# Patient Record
Sex: Female | Born: 1940 | Race: White | Hispanic: No | Marital: Married | Smoking: Former smoker
Health system: Southern US, Community
[De-identification: ages and names within clinical notes are randomized; demographics above are authoritative.]

## PROBLEM LIST (undated history)

## (undated) DIAGNOSIS — F329 Major depressive disorder, single episode, unspecified: Secondary | ICD-10-CM

## (undated) DIAGNOSIS — D126 Benign neoplasm of colon, unspecified: Secondary | ICD-10-CM

## (undated) DIAGNOSIS — K5790 Diverticulosis of intestine, part unspecified, without perforation or abscess without bleeding: Secondary | ICD-10-CM

## (undated) DIAGNOSIS — I10 Essential (primary) hypertension: Secondary | ICD-10-CM

## (undated) DIAGNOSIS — G8929 Other chronic pain: Secondary | ICD-10-CM

## (undated) DIAGNOSIS — R51 Headache: Secondary | ICD-10-CM

## (undated) DIAGNOSIS — K429 Umbilical hernia without obstruction or gangrene: Secondary | ICD-10-CM

## (undated) DIAGNOSIS — R519 Headache, unspecified: Secondary | ICD-10-CM

## (undated) DIAGNOSIS — E039 Hypothyroidism, unspecified: Secondary | ICD-10-CM

## (undated) DIAGNOSIS — E785 Hyperlipidemia, unspecified: Secondary | ICD-10-CM

## (undated) DIAGNOSIS — E0590 Thyrotoxicosis, unspecified without thyrotoxic crisis or storm: Secondary | ICD-10-CM

## (undated) DIAGNOSIS — F419 Anxiety disorder, unspecified: Secondary | ICD-10-CM

## (undated) DIAGNOSIS — K644 Residual hemorrhoidal skin tags: Secondary | ICD-10-CM

## (undated) DIAGNOSIS — F32A Depression, unspecified: Secondary | ICD-10-CM

## (undated) DIAGNOSIS — F5104 Psychophysiologic insomnia: Secondary | ICD-10-CM

## (undated) HISTORY — DX: Anxiety disorder, unspecified: F41.9

## (undated) HISTORY — DX: Headache: R51

## (undated) HISTORY — DX: Hypothyroidism, unspecified: E03.9

## (undated) HISTORY — DX: Depression, unspecified: F32.A

## (undated) HISTORY — DX: Thyrotoxicosis, unspecified without thyrotoxic crisis or storm: E05.90

## (undated) HISTORY — DX: Major depressive disorder, single episode, unspecified: F32.9

## (undated) HISTORY — DX: Diverticulosis of intestine, part unspecified, without perforation or abscess without bleeding: K57.90

## (undated) HISTORY — DX: Umbilical hernia without obstruction or gangrene: K42.9

## (undated) HISTORY — PX: OOPHORECTOMY: SHX86

## (undated) HISTORY — DX: Residual hemorrhoidal skin tags: K64.4

## (undated) HISTORY — DX: Benign neoplasm of colon, unspecified: D12.6

## (undated) HISTORY — PX: TOTAL ABDOMINAL HYSTERECTOMY: SHX209

## (undated) HISTORY — DX: Psychophysiologic insomnia: F51.04

## (undated) HISTORY — PX: COLONOSCOPY: SHX5424

## (undated) HISTORY — DX: Hyperlipidemia, unspecified: E78.5

## (undated) HISTORY — DX: Headache, unspecified: R51.9

## (undated) HISTORY — DX: Other chronic pain: G89.29

## (undated) HISTORY — DX: Essential (primary) hypertension: I10

---

## 1973-11-01 HISTORY — PX: ABDOMINAL HYSTERECTOMY: SHX81

## 1973-11-01 HISTORY — PX: APPENDECTOMY: SHX54

## 1998-03-27 ENCOUNTER — Other Ambulatory Visit: Admission: RE | Admit: 1998-03-27 | Discharge: 1998-03-27 | Payer: Self-pay | Admitting: *Deleted

## 1998-11-28 ENCOUNTER — Ambulatory Visit (HOSPITAL_COMMUNITY): Admission: RE | Admit: 1998-11-28 | Discharge: 1998-11-28 | Payer: Self-pay | Admitting: Orthopaedic Surgery

## 1999-07-07 ENCOUNTER — Other Ambulatory Visit: Admission: RE | Admit: 1999-07-07 | Discharge: 1999-07-07 | Payer: Self-pay | Admitting: *Deleted

## 2004-11-01 HISTORY — PX: OTHER SURGICAL HISTORY: SHX169

## 2004-11-24 ENCOUNTER — Ambulatory Visit: Payer: Self-pay | Admitting: Internal Medicine

## 2004-12-03 ENCOUNTER — Ambulatory Visit: Payer: Self-pay | Admitting: Internal Medicine

## 2004-12-03 DIAGNOSIS — D126 Benign neoplasm of colon, unspecified: Secondary | ICD-10-CM

## 2004-12-03 HISTORY — DX: Benign neoplasm of colon, unspecified: D12.6

## 2004-12-24 ENCOUNTER — Other Ambulatory Visit: Admission: RE | Admit: 2004-12-24 | Discharge: 2004-12-24 | Payer: Self-pay | Admitting: Obstetrics and Gynecology

## 2005-09-01 ENCOUNTER — Encounter: Admission: RE | Admit: 2005-09-01 | Discharge: 2005-09-01 | Payer: Self-pay | Admitting: Orthopedic Surgery

## 2006-01-16 ENCOUNTER — Encounter: Admission: RE | Admit: 2006-01-16 | Discharge: 2006-01-16 | Payer: Self-pay | Admitting: Ophthalmology

## 2008-07-12 ENCOUNTER — Telehealth: Payer: Self-pay | Admitting: Internal Medicine

## 2008-07-22 ENCOUNTER — Ambulatory Visit: Payer: Self-pay | Admitting: Internal Medicine

## 2008-07-22 DIAGNOSIS — K649 Unspecified hemorrhoids: Secondary | ICD-10-CM | POA: Insufficient documentation

## 2008-07-22 DIAGNOSIS — K625 Hemorrhage of anus and rectum: Secondary | ICD-10-CM | POA: Insufficient documentation

## 2009-12-15 ENCOUNTER — Encounter (INDEPENDENT_AMBULATORY_CARE_PROVIDER_SITE_OTHER): Payer: Self-pay | Admitting: *Deleted

## 2009-12-30 DIAGNOSIS — F329 Major depressive disorder, single episode, unspecified: Secondary | ICD-10-CM | POA: Insufficient documentation

## 2009-12-30 DIAGNOSIS — E559 Vitamin D deficiency, unspecified: Secondary | ICD-10-CM | POA: Insufficient documentation

## 2009-12-30 DIAGNOSIS — R002 Palpitations: Secondary | ICD-10-CM | POA: Insufficient documentation

## 2009-12-30 DIAGNOSIS — I10 Essential (primary) hypertension: Secondary | ICD-10-CM | POA: Insufficient documentation

## 2010-01-26 ENCOUNTER — Encounter (INDEPENDENT_AMBULATORY_CARE_PROVIDER_SITE_OTHER): Payer: Self-pay | Admitting: *Deleted

## 2010-02-10 DIAGNOSIS — K635 Polyp of colon: Secondary | ICD-10-CM | POA: Insufficient documentation

## 2010-02-11 ENCOUNTER — Encounter (INDEPENDENT_AMBULATORY_CARE_PROVIDER_SITE_OTHER): Payer: Self-pay | Admitting: *Deleted

## 2010-02-12 ENCOUNTER — Ambulatory Visit: Payer: Self-pay | Admitting: Internal Medicine

## 2010-03-03 ENCOUNTER — Ambulatory Visit: Payer: Self-pay | Admitting: Internal Medicine

## 2010-05-26 DIAGNOSIS — F418 Other specified anxiety disorders: Secondary | ICD-10-CM | POA: Insufficient documentation

## 2010-05-26 DIAGNOSIS — R4589 Other symptoms and signs involving emotional state: Secondary | ICD-10-CM | POA: Insufficient documentation

## 2010-11-22 ENCOUNTER — Encounter (HOSPITAL_BASED_OUTPATIENT_CLINIC_OR_DEPARTMENT_OTHER): Payer: Self-pay | Admitting: Internal Medicine

## 2010-12-01 NOTE — Letter (Signed)
Summary: Colonoscopy Letter  [facility]  [address] [place], [place] [address]   Phone: [phone]  Fax: [phone]      December 15, 2009 MRN: [id number]   Debbie Wells [address] [place], [place]  [address]   Dear [name],   According to your medical record, it is time for you to schedule a Colonoscopy. The American Cancer Society recommends this procedure as a method to detect early colon cancer. Patients with a family history of colon cancer, or a personal history of colon polyps or inflammatory bowel disease are at increased risk.  This letter has beeen generated based on the recommendations made at the time of your procedure. If you feel that in your particular situation this may no longer apply, please contact our office.  Please call our office at [phone] to schedule this appointment or to update your records at your earliest convenience.  Thank you for cooperating with [place] to provide you with the very best care possible.   Sincerely,  Iva Boop, M.D.  [facility] Gastroenterology Division [phone]

## 2010-12-01 NOTE — Miscellaneous (Signed)
Summary: L[facility] PV  Clinical Lists Changes  Medications: Added new medication of MOVIPREP 100 GM  SOLR (PEG-KCL-NACL-NASULF-NA ASC-C) As per prep instructions. - Signed Rx of MOVIPREP 100 GM  SOLR (PEG-KCL-NACL-NASULF-NA ASC-C) As per prep instructions.;  #1 x 0;  Signed;  Entered by: [name] RN;  Authorized by: Iva Boop MD, [facility];  Method used: Electronically to [id number]*, [address] [place], [place], [place]  [address], Ph: [phone], Fax: [phone] Allergies: Changed allergy or adverse reaction from SULFA to SULFA Added new allergy or adverse reaction of VICODIN    Prescriptions: MOVIPREP 100 GM  SOLR (PEG-KCL-NACL-NASULF-NA ASC-C) As per prep instructions.  #1 x 0   Entered by:   [name] RN   Authorized by:   Iva Boop MD, [facility]   Signed by:   [name] RN on 02/12/2010   Method used:   Electronically to        [facility] [phone]* (retail)       [phone] [address]       [place], [place]  [address]       Ph: [phone]       Fax: [phone]   RxID:   [id number]

## 2010-12-01 NOTE — Letter (Signed)
Summary: Previsit letter  [facility]  [address] [place], [place] [address]   Phone: [phone]  Fax: [phone]       01/26/2010 MRN: [id number]  Debbie Wells [address] [place], [place]  [address]  Dear Ms. Berntsen,  Welcome to the Gastroenterology Division at [facility].    You are scheduled to see a nurse for your pre-procedure visit on 02/12/2010 at 10:30AM on the 3rd floor at [facility], [address]  We ask that you try to arrive at our office 15 minutes prior to your appointment time to allow for check-in.  Your nurse visit will consist of discussing your medical and surgical history, your immediate family medical history, and your medications.    Please bring a complete list of all your medications or, if you prefer, bring the medication bottles and we will list them.  We will need to be aware of both prescribed and over the counter drugs.  We will need to know exact dosage information as well.  If you are on blood thinners (Coumadin, Plavix, Aggrenox, Ticlid, etc.) please call our office today/prior to your appointment, as we need to consult with your physician about holding your medication.   Please be prepared to read and sign documents such as consent forms, a financial agreement, and acknowledgement forms.  If necessary, and with your consent, a friend or relative is welcome to sit-in on the nurse visit with you.  Please bring your insurance card so that we may make a copy of it.  If your insurance requires a referral to see a specialist, please bring your referral form from your primary care physician.  No co-pay is required for this nurse visit.     If you cannot keep your appointment, please call [phone] to cancel or reschedule prior to your appointment date.  This allows [place] the opportunity to schedule an appointment for another patient in need of care.    Thank you for choosing [facility] for your medical  needs.  We appreciate the opportunity to care for you.  Please visit [place] at our website  to learn more about our practice.                     Sincerely.                                                                                                                   The Gastroenterology Division

## 2010-12-01 NOTE — Procedures (Signed)
Summary: Colonoscopy  Patient: [name] Note: All result statuses are Final unless otherwise noted.  Tests: (1) Colonoscopy (COL)   COL Colonoscopy           DONE     [facility]     [address]     [place], [place]  [address]           COLONOSCOPY PROCEDURE REPORT           PATIENT:  Debbie Wells, Debbie Wells  MR#:  [id number]     BIRTHDATE:  [birth year], 69 yrs. old  GENDER:  female     ENDOSCOPIST:  Iva Boop, MD, [facility]           PROCEDURE DATE:  03/03/2010     PROCEDURE:  Colonoscopy 19[address]     ASA CLASS:  Class II     INDICATIONS:  surveillance and high-risk screening, history of     pre-cancerous (adenomatous) colon polyps 6 mm adenoma removed 2006           MEDICATIONS:   Fentanyl 75 mcg IV, Versed 8 mg IV           DESCRIPTION OF PROCEDURE:   After the risks benefits and     alternatives of the procedure were thoroughly explained, informed     consent was obtained.  Digital rectal exam was performed and     revealed no abnormalities.   The LB CF-[id number] endoscope     was introduced through the anus and advanced to the cecum, which     was identified by both the appendix and ileocecal valve, without     limitations.  The quality of the prep was good, using MoviPrep.     The instrument was then slowly withdrawn as the colon was fully     examined. Insertion: 3:58 minutes withdrawal: 8:58 minutes     <<PROCEDUREIMAGES>>           FINDINGS:  Moderate diverticulosis was found in the sigmoid colon.     This was otherwise a normal examination of the colon.     Retroflexed views in the rectum revealed no abnormalities.    The     scope was then withdrawn from the patient and the procedure     completed.           COMPLICATIONS:  None     ENDOSCOPIC IMPRESSION:     1) Moderate diverticulosis in the sigmoid colon     2) Otherwise normal examination     3) Personal history of 6 mm adenoma 2006           REPEAT EXAM:  In 5 year(s) for routine colonoscopy for  polyp     surveillance and colon cancer screening.           Iva Boop, MD, [facility]           CC:  Adrian Prince, MD     The Patient           n.     eSIGNED:   Iva Boop at 03/03/2010 09:40 AM           Shauna Hugh, [phone]  Note: An exclamation mark (!) indicates a result that was not dispersed into the flowsheet. Document Creation Date: 03/03/2010 9:40 AM _______________________________________________________________________  (1) Order result status: Final Collection or observation date-time: 03/03/2010 09:29 Requested date-time:  Receipt date-time:  Reported date-time:  Referring Physician:   Ordering Physician: Baldo Ash  Leone Payor [phone]) Specimen Source:  Source: Launa Grill Order Number: [id number] Lab site:   Appended Document: Colonoscopy    Clinical Lists Changes  Observations: Added new observation of COLONNXTDUE: 03/2015 (03/03/2010 13:22)

## 2010-12-01 NOTE — Letter (Signed)
Summary: Moviprep Instructions  [facility]  [address]   [place], [place] [address]   Phone: [phone]  Fax: [phone]       Debbie Wells    [birth year]    MRN: [id number]        Procedure Day Dorna Bloom: Tuesday, 03-03-10     Arrival Time: 8:00 a.m.      Procedure Time: 9:00 a.m.     Location of Procedure:                     x  [facility] (4th Floor)                        PREPARATION FOR COLONOSCOPY WITH MOVIPREP   Starting 5 days prior to your procedure 02-26-10  do not eat nuts, seeds, popcorn, corn, beans, peas,  salads, or any raw vegetables.  Do not take any fiber supplements (e.g. Metamucil, Citrucel, and Benefiber).  THE DAY BEFORE YOUR PROCEDURE         DATE:  03-02-10   DAY: Monday  1.  Drink clear liquids the entire day-NO SOLID FOOD  2.  Do not drink anything colored red or purple.  Avoid juices with pulp.  No orange juice.  3.  Drink at least 64 oz. (8 glasses) of fluid/clear liquids during the day to prevent dehydration and help the prep work efficiently.  CLEAR LIQUIDS INCLUDE: Water Jello Ice Popsicles Tea (sugar ok, no milk/cream) Powdered fruit flavored drinks Coffee (sugar ok, no milk/cream) Gatorade Juice: apple, white grape, white cranberry  Lemonade Clear bullion, consomm, broth Carbonated beverages (any kind) Strained chicken noodle soup Hard Candy                             4.  In the morning, mix first dose of MoviPrep solution:    Empty 1 Pouch A and 1 Pouch B into the disposable container    Add lukewarm drinking water to the top line of the container. Mix to dissolve    Refrigerate (mixed solution should be used within 24 hrs)  5.  Begin drinking the prep at 5:00 p.m. The MoviPrep container is divided by 4 marks.   Every 15 minutes drink the solution down to the next mark (approximately 8 oz) until the full liter is complete.   6.  Follow completed prep with 16 oz of clear liquid of your choice  (Nothing red or purple).  Continue to drink clear liquids until bedtime.  7.  Before going to bed, mix second dose of MoviPrep solution:    Empty 1 Pouch A and 1 Pouch B into the disposable container    Add lukewarm drinking water to the top line of the container. Mix to dissolve    Refrigerate  THE DAY OF YOUR PROCEDURE      DATE:  03-03-10  DAY: Tuesday  Beginning at 4:00 a.m. (5 hours before procedure):         1. Every 15 minutes, drink the solution down to the next mark (approx 8 oz) until the full liter is complete.  2. Follow completed prep with 16 oz. of clear liquid of your choice.    3. You may drink clear liquids until 7:00 a.m.   (2 HOURS BEFORE PROCEDURE).   MEDICATION INSTRUCTIONS  Unless otherwise instructed, you should take regular prescription medications with a small sip of water  as early as possible the morning of your procedure.         OTHER INSTRUCTIONS  You will need a responsible adult at least 69 years of age to accompany you and drive you home.   This person must remain in the waiting room during your procedure.  Wear loose fitting clothing that is easily removed.  Leave jewelry and other valuables at home.  However, you may wish to bring a book to read or  an iPod/MP3 player to listen to music as you wait for your procedure to start.  Remove all body piercing jewelry and leave at home.  Total time from sign-in until discharge is approximately 2-3 hours.  You should go home directly after your procedure and rest.  You can resume normal activities the  day after your procedure.  The day of your procedure you should not:   Drive   Make legal decisions   Operate machinery   Drink alcohol   Return to work  You will receive specific instructions about eating, activities and medications before you leave.    The above instructions have been reviewed and explained to me by  Debbie Sites RN  February 12, 2010 11:03 AM    I fully  understand and can verbalize these instructions _____________________________ Date _________

## 2011-09-15 ENCOUNTER — Other Ambulatory Visit (HOSPITAL_COMMUNITY): Payer: Self-pay | Admitting: Endocrinology

## 2011-09-15 DIAGNOSIS — I350 Nonrheumatic aortic (valve) stenosis: Secondary | ICD-10-CM

## 2011-09-17 ENCOUNTER — Ambulatory Visit (HOSPITAL_COMMUNITY): Payer: Medicare Other | Attending: Internal Medicine | Admitting: Radiology

## 2011-09-17 DIAGNOSIS — R0989 Other specified symptoms and signs involving the circulatory and respiratory systems: Secondary | ICD-10-CM | POA: Insufficient documentation

## 2011-09-17 DIAGNOSIS — R609 Edema, unspecified: Secondary | ICD-10-CM | POA: Insufficient documentation

## 2011-09-17 DIAGNOSIS — R5381 Other malaise: Secondary | ICD-10-CM | POA: Insufficient documentation

## 2011-09-17 DIAGNOSIS — I350 Nonrheumatic aortic (valve) stenosis: Secondary | ICD-10-CM

## 2011-09-17 DIAGNOSIS — I359 Nonrheumatic aortic valve disorder, unspecified: Secondary | ICD-10-CM

## 2011-09-17 DIAGNOSIS — R0609 Other forms of dyspnea: Secondary | ICD-10-CM | POA: Insufficient documentation

## 2011-09-17 DIAGNOSIS — I059 Rheumatic mitral valve disease, unspecified: Secondary | ICD-10-CM | POA: Insufficient documentation

## 2011-09-17 DIAGNOSIS — R5383 Other fatigue: Secondary | ICD-10-CM | POA: Insufficient documentation

## 2011-09-20 ENCOUNTER — Encounter (HOSPITAL_COMMUNITY): Payer: Self-pay | Admitting: Endocrinology

## 2011-11-17 ENCOUNTER — Encounter: Payer: Self-pay | Admitting: Internal Medicine

## 2011-12-07 ENCOUNTER — Ambulatory Visit (INDEPENDENT_AMBULATORY_CARE_PROVIDER_SITE_OTHER): Payer: Medicare Other | Admitting: Internal Medicine

## 2011-12-07 ENCOUNTER — Encounter: Payer: Self-pay | Admitting: Internal Medicine

## 2011-12-07 ENCOUNTER — Other Ambulatory Visit (INDEPENDENT_AMBULATORY_CARE_PROVIDER_SITE_OTHER): Payer: Medicare Other

## 2011-12-07 VITALS — BP 150/72 | HR 68 | Ht 64.5 in | Wt 158.6 lb

## 2011-12-07 DIAGNOSIS — R10812 Left upper quadrant abdominal tenderness: Secondary | ICD-10-CM

## 2011-12-07 DIAGNOSIS — K5900 Constipation, unspecified: Secondary | ICD-10-CM

## 2011-12-07 DIAGNOSIS — R1012 Left upper quadrant pain: Secondary | ICD-10-CM

## 2011-12-07 DIAGNOSIS — K5909 Other constipation: Secondary | ICD-10-CM

## 2011-12-07 LAB — CREATININE, SERUM: Creatinine, Ser: 0.9 mg/dL (ref 0.4–1.2)

## 2011-12-07 LAB — BUN: BUN: 18 mg/dL (ref 6–23)

## 2011-12-07 NOTE — Progress Notes (Signed)
Subjective:    Patient ID: Debbie Wells, female    DOB: 07-18-1941, 71 y.o.   MRN: [id number]  HPI This lady has a 1 year history of LUQ pain that began after falling in the shower. It can be a pulling pain, tightness. Sometimes sharp. Worse when she is getting in and out of bed. Not related to eating. She has chronic constipation and thinks the pain is more intense and is less but persistent after "good elimination". The pain is there all the time. It does not disturb sleep. She cannot defecate with Amitiza or MiraLax which she uses intermittently (both). She will take something after 3 days without defecation. Denies rectal bleeding. She has gotten some mild relief of LUQ pain with intermittent ibuprofen. She is concerned because her father had esophageal cancer his first symptoms were RUQ pain. She has no dysphagia.  Allergies  Allergen Reactions  . Hydrocodone-Acetaminophen     REACTION: rash  . Sulfonamide Derivatives     REACTION: rash   Outpatient Prescriptions Prior to Visit  Medication Sig Dispense Refill  . ALPRAZolam (XANAX) 0.5 MG tablet Take 0.5 mg by mouth 2 (two) times daily.      Marland Kitchen aspirin 81 MG tablet Take 160 mg by mouth daily.      Marland Kitchen atorvastatin (LIPITOR) 10 MG tablet Take 10 mg by mouth daily.      . Calcium Carbonate-Vitamin D (CALCIUM-D) 600-400 MG-UNIT TABS Take 1 tablet by mouth daily.      . DULoxetine (CYMBALTA) 60 MG capsule Take 60 mg by mouth daily.      Marland Kitchen estrogens, conjugated, (PREMARIN) 0.45 MG tablet Take 0.45 mg by mouth daily. Take daily for 21 days then do not take for 7 days.      Marland Kitchen lubiprostone (AMITIZA) 24 MCG capsule 1 or 2 daily as needed      . Multiple Vitamin (MULTIVITAMIN) capsule Take 1 capsule by mouth daily.      . Omega-3 Fatty Acids (FISH OIL PO) Take 1 tablet by mouth daily.      . traZODone (DESYREL) 50 MG tablet Take 50 mg by mouth daily.      . benazepril (LOTENSIN) 20 MG tablet Take 20 mg by mouth daily.      . hydrocortisone  (ANUSOL-HC) 25 MG suppository Place 25 mg rectally at bedtime as needed.      Marland Kitchen levothyroxine (SYNTHROID, LEVOTHROID) 112 MCG tablet Take 112 mcg by mouth daily.       Past Medical History  Diagnosis Date  . Anxiety   . Chronic headaches   . Adenomatous colon polyp   . Diverticulosis   . Depression   . Hyperlipidemia   . Hypertension   . Hypothyroidism    Past Surgical History  Procedure Date  . Spinal lipoma removed 2006  . Abdominal hysterectomy 1975  . Appendectomy 1975  . Colonoscopy 03/03/2010    diverticulosis  . Oophorectomy    History   Social History  . Marital Status: Married    Spouse Name: N/A    Number of Children: 2  . Years of Education: N/A   Occupational History  . retired    Social History Main Topics  . Smoking status: Former Games developer  . Smokeless tobacco: Never Used  . Alcohol Use: Yes     wine occasionally  . Drug Use: No  . Sexually Active: None   Other Topics Concern  . None   Social History Narrative  . None  Family History  Problem Relation Age of Onset  . Esophageal cancer Father   . Stomach cancer Father     mets from esophagus  . Heart disease Mother   . Irritable bowel syndrome Mother   . Kidney disease Mother   . Colon cancer Neg Hx         Review of Systems + anxiety, depression, fatigue, mild urinary incontinence. All other review of systems negative or as per history of present illness    Objective:   Physical Exam General:  Well-developed, well-nourished and in no acute distress Eyes:  anicteric. ENT:   Mouth and posterior pharynx free of lesions.  Neck:   supple w/o thyromegaly or mass.  Lungs: Clear to auscultation bilaterally. Heart:  S1S2, no rubs, murmurs, gallops. Abdomen:  soft, non-tender, no hepatosplenomegaly, hernia, or mass and BS+.  Chest wall: Tender over inferior left anterior ribs in the LUQ area, and some abdominal wall pain and tenderness with muscle tension Lymph:  no cervical or  supraclavicular adenopathy. Skin   no rash. Neuro:  A&O x 3.  Psych:  appropriate mood and  Affect.   Data Reviewed: Laboratory testing from November 1 shows a normal CBC, normal lipid panel, normal metabolic panel comprehensive. Direct function was normal. TSH was low but her free T4 was normal. Vitamin D was 43. Urinalysis was negative.        Assessment & Plan:   1. LUQ pain   2. LUQ abdominal tenderness    3. Chronic constipation I think this is probably musculoskeletal process. She's quite concerned about the possibility of an underlying malignancy or more serious problem due to the persistence. I await with CT scan of the abdomen with contrast. Once that is done we'll call the results. She may need a topical or oral nonsteroidal regular basis.   I have advised her to take her Amitiza daily instead of intermittently to help her chronic constipation.

## 2011-12-07 NOTE — Patient Instructions (Addendum)
Please go to the basement upon leaving today to have your labs done. Please take your Amitiza every day for your constipation.  You have been scheduled for a CT scan of the abdomen and pelvis at [facility] ([address]---this is in the same building as Architectural technologist).   You are scheduled on 12/13/11 at 9:00 am. You should arrive 15 minutes prior to your appointment time for registration. Please follow the written instructions below on the day of your exam:  WARNING: IF YOU ARE ALLERGIC TO IODINE/X-RAY DYE, PLEASE NOTIFY RADIOLOGY IMMEDIATELY AT [phone]! YOU WILL BE GIVEN A 13 HOUR PREMEDICATION PREP.  1) Do not eat or drink anything after 5:00 am (4 hours prior to your test) 2) You have been given 2 bottles of oral contrast to drink. The solution may taste better if refrigerated, but do NOT add ice or any other liquid to this solution. Shake well before drinking.    Drink 1 bottle of contrast @ 7:00 am (2 hours prior to your exam)  Drink 1 bottle of contrast @ 8:00 am (1 hour prior to your exam)  You may take any medications as prescribed with a small amount of water except for the following: Metformin, Glucophage, Glucovance, Avandamet, Riomet, Fortamet, Actoplus Met, Janumet, Glumetza or Metaglip. The above medications must be held the day of the exam AND 48 hours after the exam.  The purpose of you drinking the oral contrast is to aid in the visualization of your intestinal tract. The contrast solution may cause some diarrhea. Before your exam is started, you will be given a small amount of fluid to drink. Depending on your individual set of symptoms, you may also receive an intravenous injection of x-ray contrast/dye. Plan on being at [facility] for 30 minutes or long, depending on the type of exam you are having performed.  If you have any questions regarding your exam or if you need to reschedule, you may call the CT department at [phone] between the  hours of 8:00 am and 5:00 pm, Monday-Friday.  ________________________________________________________________________

## 2011-12-10 ENCOUNTER — Other Ambulatory Visit: Payer: Medicare Other

## 2011-12-13 ENCOUNTER — Other Ambulatory Visit: Payer: Self-pay

## 2011-12-13 ENCOUNTER — Ambulatory Visit (INDEPENDENT_AMBULATORY_CARE_PROVIDER_SITE_OTHER)
Admission: RE | Admit: 2011-12-13 | Discharge: 2011-12-13 | Disposition: A | Discharge status: Home / Self Care | Payer: Medicare Other | Source: Ambulatory Visit | Attending: Internal Medicine | Admitting: Internal Medicine

## 2011-12-13 DIAGNOSIS — R10812 Left upper quadrant abdominal tenderness: Secondary | ICD-10-CM

## 2011-12-13 DIAGNOSIS — R1012 Left upper quadrant pain: Secondary | ICD-10-CM

## 2011-12-13 DIAGNOSIS — R933 Abnormal findings on diagnostic imaging of other parts of digestive tract: Secondary | ICD-10-CM

## 2011-12-13 MED ORDER — IOHEXOL 300 MG/ML  SOLN
100.0000 mL | Freq: Once | INTRAMUSCULAR | Status: AC | PRN
  Administered 2011-12-13: 100 mL via INTRAVENOUS

## 2011-12-13 NOTE — Progress Notes (Signed)
Quick Note:  No clear cause of her pain seen The stomach wall appears to be thickened - the radiologist thinks because stomach did not distend. He is probably right but with the chronicity of her pain I would recommend an EGD - indication is abnormal stomach on CT 793.4 ______

## 2011-12-14 ENCOUNTER — Ambulatory Visit (AMBULATORY_SURGERY_CENTER): Payer: Medicare Other | Admitting: *Deleted

## 2011-12-14 VITALS — Ht 64.0 in | Wt 158.0 lb

## 2011-12-14 DIAGNOSIS — R1012 Left upper quadrant pain: Secondary | ICD-10-CM

## 2011-12-14 DIAGNOSIS — R933 Abnormal findings on diagnostic imaging of other parts of digestive tract: Secondary | ICD-10-CM

## 2011-12-16 ENCOUNTER — Encounter: Payer: Self-pay | Admitting: Internal Medicine

## 2011-12-16 ENCOUNTER — Ambulatory Visit (AMBULATORY_SURGERY_CENTER): Payer: Medicare Other | Admitting: Internal Medicine

## 2011-12-16 VITALS — BP 145/81 | HR 74 | Temp 97.8°F | Resp 16 | Ht 64.0 in | Wt 158.0 lb

## 2011-12-16 DIAGNOSIS — R933 Abnormal findings on diagnostic imaging of other parts of digestive tract: Secondary | ICD-10-CM

## 2011-12-16 DIAGNOSIS — R1012 Left upper quadrant pain: Secondary | ICD-10-CM

## 2011-12-16 HISTORY — PX: ESOPHAGOGASTRODUODENOSCOPY: SHX1529

## 2011-12-16 MED ORDER — LIDOCAINE 5 % EX PTCH: 1.0000 | MEDICATED_PATCH | Freq: Two times a day (BID) | CUTANEOUS | Status: DC | PRN

## 2011-12-16 MED ORDER — SODIUM CHLORIDE 0.9 % IV SOLN: 500.0000 mL | INTRAVENOUS | Status: DC

## 2011-12-16 NOTE — Patient Instructions (Signed)
The esophagus, stomach and duodenum look normal. I have prescribed a patch with numbing agent for you to apply to the area you have pain, in the left upper abdomen and lower chest area. Try that - if it works use it for a few weeks and then see if you can stop. If you have persistent problems with the pain I suggest you talk to Dr. Evlyn Kanner about it as I do not think it is coming from your gastrointestinal tract but more likely from the muscles and possibly bones in the area. Iva Boop, MD, [facility]

## 2011-12-16 NOTE — Progress Notes (Signed)
Patient did not experience any of the following events: a burn prior to discharge; a fall within the facility; wrong site/side/patient/procedure/implant event; or a hospital transfer or hospital admission upon discharge from the facility. (G8907) Patient did not have preoperative order for IV antibiotic SSI prophylaxis. (G8918)  

## 2011-12-16 NOTE — Op Note (Signed)
[facility] [address] [place], [place]  [address]  ENDOSCOPY PROCEDURE REPORT  PATIENT:  Debbie, Wells  MR#:  [id number] BIRTHDATE:  27-Oct-1941, 70 yrs. old  GENDER:  female  ENDOSCOPIST:  Iva Boop, MD, [facility]  PROCEDURE DATE:  12/16/2011 PROCEDURE:  EGD, diagnostic [phone] ASA CLASS:  Class II INDICATIONS:  abdominal pain, left upper quadr, abnormal imaging - thickened gastric wall on CT  MEDICATIONS:   These medications were titrated to patient response per physician's verbal order, Fentanyl 50 mcg IV, Versed 8 mg IV TOPICAL ANESTHETIC:  Cetacaine Spray  DESCRIPTION OF PROCEDURE:   After the risks benefits and alternatives of the procedure were thoroughly explained, informed consent was obtained.  The LB [id number]-H180 [id number] endoscope was introduced through the mouth and advanced to the second portion of the duodenum, without limitations.  The instrument was slowly withdrawn as the mucosa was fully examined. <<PROCEDUREIMAGES>>  The upper, middle, and distal third of the esophagus were carefully inspected and no abnormalities were noted. The z-line was well seen at the GEJ. The endoscope was pushed into the fundus which was normal including a retroflexed view. The antrum,gastric body, first and second part of the duodenum were unremarkable. Retroflexed views revealed no abnormalities.    The scope was then withdrawn from the patient and the procedure completed.  COMPLICATIONS:  None  ENDOSCOPIC IMPRESSION: 1) Normal EGD RECOMMENDATIONS: Try lidoderm patch. Follow-up PCP if persistent problems - do not think she has a GI source of pain.  Iva Boop, MD, [facility]  CC:  Adrian Prince, MD and The Patient  n. eSIGNED:   Iva Boop at 12/16/2011 04:28 PM  Shauna Hugh, [phone]

## 2011-12-16 NOTE — Progress Notes (Signed)
Patient stated she did break out in a fine rash widespread trunk,back,hips and legs on 12/14/11. Patient had a CT with dye on Monday 12/13/11.

## 2011-12-17 ENCOUNTER — Telehealth: Payer: Self-pay | Admitting: *Deleted

## 2011-12-17 NOTE — Telephone Encounter (Signed)
  Follow up Call-  Call back number 12/16/2011  Post procedure Call Back phone  # [phone]  Permission to leave phone message Yes     Patient questions:  Do you have a fever, pain , or abdominal swelling? no Pain Score  0 *  Have you tolerated food without any problems? yes  Have you been able to return to your normal activities? yes  Do you have any questions about your discharge instructions: Diet   no Medications  no Follow up visit  no  Do you have questions or concerns about your Care? no  Actions: * If pain score is 4 or above: No action needed, pain <4.

## 2012-02-14 ENCOUNTER — Telehealth: Payer: Self-pay | Admitting: Internal Medicine

## 2012-02-14 NOTE — Telephone Encounter (Signed)
Per office and procedure notes the patient was to follow up with her primary care.  "Do not think she has a GI source of pain".  Patient advised to follow up with her GI

## 2012-05-26 ENCOUNTER — Telehealth: Payer: Self-pay | Admitting: Internal Medicine

## 2012-05-26 NOTE — Telephone Encounter (Signed)
Patient states she saw Dr. Leone Payor back in spring and was told no GI problem and see PCP. She has done this and PCP cannot find her problem either. States she has had a change in bowel movements since her visit. She had constipation in past but now has pencil size BM's to small pieces. She also states she did not tell Dr. Leone Payor that her father died with stomach cancer. She feels her symptoms are worse and have changed. Her PCP told her to see GI again. Scheduled patient on 06/19/12 at 2:15 PM.

## 2012-06-19 ENCOUNTER — Ambulatory Visit (INDEPENDENT_AMBULATORY_CARE_PROVIDER_SITE_OTHER): Payer: Medicare Other | Admitting: Internal Medicine

## 2012-06-19 ENCOUNTER — Ambulatory Visit (INDEPENDENT_AMBULATORY_CARE_PROVIDER_SITE_OTHER)
Admission: RE | Admit: 2012-06-19 | Discharge: 2012-06-19 | Disposition: A | Discharge status: Home / Self Care | Payer: Medicare Other | Source: Ambulatory Visit | Attending: Internal Medicine | Admitting: Internal Medicine

## 2012-06-19 ENCOUNTER — Encounter: Payer: Self-pay | Admitting: Internal Medicine

## 2012-06-19 VITALS — BP 142/86 | HR 60 | Ht 63.5 in | Wt 155.0 lb

## 2012-06-19 DIAGNOSIS — M549 Dorsalgia, unspecified: Secondary | ICD-10-CM

## 2012-06-19 DIAGNOSIS — R109 Unspecified abdominal pain: Secondary | ICD-10-CM

## 2012-06-19 DIAGNOSIS — G8929 Other chronic pain: Secondary | ICD-10-CM

## 2012-06-19 DIAGNOSIS — R198 Other specified symptoms and signs involving the digestive system and abdomen: Secondary | ICD-10-CM

## 2012-06-19 DIAGNOSIS — R194 Change in bowel habit: Secondary | ICD-10-CM

## 2012-06-19 DIAGNOSIS — R1011 Right upper quadrant pain: Secondary | ICD-10-CM

## 2012-06-19 DIAGNOSIS — R1010 Upper abdominal pain, unspecified: Secondary | ICD-10-CM

## 2012-06-19 MED ORDER — DEXLANSOPRAZOLE 60 MG PO CPDR: 60.0000 mg | DELAYED_RELEASE_CAPSULE | Freq: Every day | ORAL | Status: DC

## 2012-06-19 NOTE — Patient Instructions (Addendum)
Your physician has requested that you go to the basement for the following lab work before leaving today: IGA, TTG  Please go to the basement and get a spine x-ray today.  We are giving you samples of Dexilant, take one 30 minutes prior to breakfast.  Follow-up with [place] in one month.  Thank you for choosing me and [facility].  Iva Boop, M.D., [facility]

## 2012-06-20 ENCOUNTER — Other Ambulatory Visit (INDEPENDENT_AMBULATORY_CARE_PROVIDER_SITE_OTHER): Payer: Medicare Other

## 2012-06-20 DIAGNOSIS — R1010 Upper abdominal pain, unspecified: Secondary | ICD-10-CM

## 2012-06-20 DIAGNOSIS — R109 Unspecified abdominal pain: Secondary | ICD-10-CM

## 2012-06-20 DIAGNOSIS — R198 Other specified symptoms and signs involving the digestive system and abdomen: Secondary | ICD-10-CM

## 2012-06-20 DIAGNOSIS — R194 Change in bowel habit: Secondary | ICD-10-CM

## 2012-06-20 LAB — IGA: IgA: 185 mg/dL (ref 68–378)

## 2012-06-20 NOTE — Progress Notes (Signed)
Quick Note:  spine films ok Waiting on celiac testing ______

## 2012-06-24 DIAGNOSIS — G8929 Other chronic pain: Secondary | ICD-10-CM | POA: Insufficient documentation

## 2012-06-24 NOTE — Progress Notes (Signed)
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

## 2012-06-24 NOTE — Progress Notes (Signed)
Subjective:    Patient ID: Debbie Wells, female    DOB: 02/05/1941, 71 y.o.   MRN: [id number]  HPI This lady presents in followup after lasting seen in February or March of this year. At that time she had at least a 1 year history of left upper quadrant pain that I thought was musculoskeletal. History obtained at that time was that she had fallen in the shower. She now tells me the pain was intensified and that was present prior to that. She had negative EGD and CT scan and labs. She has chronic constipation. Amitiza helped her defecate but if takes a daily she has diarrhea. Every other day seems to promote good bowel movements but does not really relieve this pain. She tried linaclotide but that caused diarrhea. When she does defecate she feels sore and it is  painful. A Lidoderm patch was prescribed which does provide some relief, obviously only when she wears. She does have the back pain also.  During the interview she becomes tearful and very upset and is clearly very tired of being disturbed by this pain. She remains fearful as she may have cancer like her fathr., Though I reassured her that she does not have any signs of esophageal cancer. She reports that her daughter was diagnosed with small bowel intestinal overgrowth and she wonders if that is her problem because her daughter had very similar symptoms apparently. She is much better after antibiotic therapy  Patient does have depression and has been on Cymbalta for a number of years. Wt Readings from Last 3 Encounters:  06/19/12 155 lb (70.308 kg)  12/16/11 158 lb (71.668 kg)  12/14/11 158 lb (71.668 kg)   Medications, allergies, past medical history, past surgical history, family history and social history are reviewed and updated in the EMR.  Review of Systems As per history of present illness    Objective:   Physical Exam General:  NAD Eyes:   anicteric Lungs:  clear Heart:  S1S2 no rubs, murmurs or gallops Abdomen:  soft and  BS+, tender in the left upper quadrant, over the ribs and in the abdominal wall. The pain is worse with muscle tension. Ext:   no edema Psych:  Depressed and tearful   Data Reviewed: As above     Assessment & Plan:   1. Upper abdominal pain   2. Change in bowel habits   3. Mid back pain    1. There does not appear to be a gastrointestinal cause for her pain outside of the possibility of irritable bowel syndrome along with musculoskeletal abdominal wall pain and I suspect somatzation issues. 2. Will check T-spine films, these have come back negative. 3. Will check celiac antibody testing, this has come back negative. 4. I'm inclined to go ahead with a trial of antibiotic therapy just to satisfy her nausea and that she might have bacterial overgrowth though I think it's quite unlikely to relieve her symptoms. 5. Will try to prescribe Xifaxan 550 mg 3 times a day for 10 days. 6. This might, to another colonoscopy though she's had one in the last couple of years, she also may need to look at changing her antidepressant therapy or have a more chronic pain focus to her therapy (without narcotics)   THORACIC SPINE - 2 VIEW  Comparison: None.  Findings: Mild narrowing of the C5-6 disc is incidentally noted.  There is no evidence of thoracic spine fracture. Alignment is  normal. No other significant bone abnormalities are  identified.  IMPRESSION:  1. Negative thoracic spine  2. Degenerative disc disease C5-6.  Tissue Transglutaminase Ab, IgA 3.3 <20 U/mL Comments: Reference Range: <20 Negative 20-25 Equivocal >25 Positive     CC: Julian Hy, MD

## 2012-06-26 ENCOUNTER — Other Ambulatory Visit: Payer: Self-pay

## 2012-06-26 MED ORDER — RIFAXIMIN 550 MG PO TABS: 550.0000 mg | ORAL_TABLET | Freq: Three times a day (TID) | ORAL | Status: DC

## 2012-07-25 ENCOUNTER — Encounter: Payer: Self-pay | Admitting: Internal Medicine

## 2012-07-25 ENCOUNTER — Ambulatory Visit (INDEPENDENT_AMBULATORY_CARE_PROVIDER_SITE_OTHER): Payer: Medicare Other | Admitting: Internal Medicine

## 2012-07-25 VITALS — BP 138/78 | HR 60 | Ht 64.0 in | Wt 155.0 lb

## 2012-07-25 DIAGNOSIS — K429 Umbilical hernia without obstruction or gangrene: Secondary | ICD-10-CM

## 2012-07-25 DIAGNOSIS — R10819 Abdominal tenderness, unspecified site: Secondary | ICD-10-CM

## 2012-07-25 DIAGNOSIS — R1013 Epigastric pain: Secondary | ICD-10-CM

## 2012-07-25 DIAGNOSIS — R1033 Periumbilical pain: Secondary | ICD-10-CM

## 2012-07-25 NOTE — Progress Notes (Signed)
Subjective:    Patient ID: Debbie Wells, female    DOB: Mar 28, 1941, 71 y.o.   MRN: [id number]  HPI The patient returns with persistent abdominal pain. Mainly in upper mid, epigastric pain that is constant. She also has other abdominal pain. I had prescribed Xifaxan when she was last seen here, to see if she had a component of bacterial overgrowth contributing to bloating and some of her symptoms. I also given Dexilant samples. She reports that while on the Xifaxan and about 5 days after she felt somewhat better though certainly not relieved, there was a decrease in soreness. Soon after that she developed diarrhea and stopped the Dexilant and the diarrhea resolved.  MiraLax helps her move her bowels but she still is incomplete defecation. When she uses Amitiza her bowel movement that causes increased pain since she avoids that and linaclotide cause diarrhea when she was given that.  She is very frustrated and willing and almost requesting to do any other studies like an MRI or repeating a colonoscopy to try to understand the cause. I have explained before and again today that I don't think those studies would yield any useful information. In the past she has had abdominal wall type symptoms and findings on exam, and some of her pain is related to movement.   Medications, allergies, past medical history, past surgical history, family history and social history are reviewed and updated in the EMR.    Review of Systems As above    Objective:   Physical Exam Well-developed well-nourished elderly white woman in no acute distress Mood and affect appear appropriate Exam shows an easily reducible umbilical hernia that is somewhat tender. She is more tender superior to this. There is a diastasis recti that is somewhat tender. She also has tenderness in the left lower groin area above and on the pubic bones. I cannot reproduce pain with leg flexion or straight leg raise her manipulation of the hip. When  I palpate her abdomen there is other diffuse tenderness less severe. She tends to have more pain in these areas with muscle tension with the exception of the umbilical hernia area.       Assessment & Plan:   1. Periumbilical abdominal pain   2. Epigastric pain   3. Abdominal tenderness   4. Umbilical hernia     1. These problems are all chronic for over a year. I've explained to her that points to a benign etiology but understand that she is suffering. 2. Or maybe a component or contribution of her umbilical hernia causing problems though I'm not convinced. I think it would be worthwhile for another physician to examine her and get their thoughts on the cause of her abdominal pain and symptoms. General surgery consultation appointment will be made. Hernia repair is an option though I made it clear I did not think that would relieve all of her problems and that we were just after an opinion about possible contribution to her symptoms and her overall abdominal pain, mostly. 3. Pending this evaluation I am at a loss as to what to recommend. She may need a tertiary GI referral. I really think some/most of her pain is abdominal wall. She is already on trazodone, perhaps changing that to a low dose tricyclic might provide some relief. She is already on Cymbalta.  In the past there was definitely a mielieu. of anxiety and stress over this and I do understand her frustration. I don't know if there is some other  underlying stress that is making this worse. 4. I do not think, based upon what I can see a repeat colonoscopy which she had any significant light on this, she's had a CT scan in the past year, she's had a colonoscopy in 2011. I doubt an MRI would tell if anything different.   CC: Julian Hy, MD and Claud Kelp

## 2012-07-25 NOTE — Patient Instructions (Addendum)
You have been scheduled for an appointment with Dr. Derrell Lolling at [facility]. Your appointment is on 08/18/12 at 4:00pm Please arrive at 3:30 for registration. Make certain to bring a list of current medications, including any over the counter medications or vitamins. Also bring your co-pay if you have one as well as your insurance cards. [facility] is located at [address]. Should you need to reschedule your appointment, please contact them at [phone].  Thank you for choosing me and  Gastroenterology.  Iva Boop, M.D., [facility]

## 2012-08-18 ENCOUNTER — Ambulatory Visit (INDEPENDENT_AMBULATORY_CARE_PROVIDER_SITE_OTHER): Payer: Medicare Other | Admitting: General Surgery

## 2012-08-18 ENCOUNTER — Encounter (INDEPENDENT_AMBULATORY_CARE_PROVIDER_SITE_OTHER): Payer: Self-pay | Admitting: General Surgery

## 2012-08-18 VITALS — BP 128/80 | HR 80 | Temp 97.3°F | Resp 18 | Ht 64.5 in | Wt 152.4 lb

## 2012-08-18 DIAGNOSIS — R1013 Epigastric pain: Secondary | ICD-10-CM

## 2012-08-18 DIAGNOSIS — K420 Umbilical hernia with obstruction, without gangrene: Secondary | ICD-10-CM

## 2012-08-18 DIAGNOSIS — G8929 Other chronic pain: Secondary | ICD-10-CM

## 2012-08-18 NOTE — Patient Instructions (Signed)
You have a small umbilical hernia with some incarcerated fatty tissue in it. I am not sure that this explains all of your symptoms.  Although your symptoms or not classic for gallbladder disease, you  will be scheduled for gallbladder x-rays to be sure that you do not have gallbladder problems.  You will be given an appointment to return to see Dr. Derrell Lolling after the x-rays are performed.

## 2012-08-18 NOTE — Progress Notes (Addendum)
Patient ID: DENICE CARDON, female   DOB: 12-14-1940, 71 y.o.   MRN: [id number]  No chief complaint on file.   HPI LONA SIX is a 71 y.o. female.  She is referred by Dr. Stan Head for abdominal pain and umbilical hernia. Dr. Adrian Prince is  her primary care physician and endocrinologist.  This is a pleasant woman who admits that she has a fair amount of depression and anxiety. She is very open about this. She has epigastric pain that waxes and wanes but is fairly persistent.  The pain will  sometimes grab her very hard and radiate up to the left. She says is sometimes  worse when she has bowel movements. She has been constipated but this is better now with daily MiraLAX. She says food really does not seem to have an aggravating or alleviating factor on epigastric pain. She has been tried on a variety of medications.  She had upper endoscopy in March of this year which was unremarkable. Colonoscopy 2011 was apparently unremarkable. CT scan on 12/13/2011 showed a small fatty containing umbilical hernia but was otherwise negative. She has not had any ultrasound or biliary scan. She says that she has had lab work which was normal but I do not have those reports.  Comorbidities include history of depression, on beta blocker for headaches, hypertension, on Premarin, Synthroid, on trazodone and Xanax for anxiety.  She admits to having fear of cancer because her father died of metastatic esophageal cancer. HPI  Past Medical History  Diagnosis Date  . Anxiety   . Chronic headaches   . Adenomatous colon polyp   . Diverticulosis   . Depression   . Hyperlipidemia   . Hypertension   . Hypothyroidism   . Adenomatous polyp of colon 12/03/2004    6mm  . External hemorrhoids     Past Surgical History  Procedure Date  . Spinal lipoma removed 2006  . Abdominal hysterectomy 1975  . Appendectomy 1975  . Colonoscopy 03/03/2010,12/03/2004  . Oophorectomy   . Esophagogastroduodenoscopy 12/16/11   Dr. Stan Head    Family History  Problem Relation Age of Onset  . Esophageal cancer Father   . Stomach cancer Father     mets from esophagus  . Heart disease Mother   . Irritable bowel syndrome Mother   . Kidney disease Mother   . Colon cancer Neg Hx     Social History History  Substance Use Topics  . Smoking status: Former Games developer  . Smokeless tobacco: Never Used  . Alcohol Use: 1.8 oz/week    3 Glasses of wine per week     wine occasionally    Allergies  Allergen Reactions  . Hydrocodone-Acetaminophen     REACTION: rash  . Sulfonamide Derivatives     REACTION: rash    Current Outpatient Prescriptions  Medication Sig Dispense Refill  . ALPRAZolam (XANAX) 0.5 MG tablet Take 0.5 mg by mouth 2 (two) times daily.      Marland Kitchen aspirin 81 MG tablet Take 160 mg by mouth daily.      Marland Kitchen atorvastatin (LIPITOR) 10 MG tablet Take 10 mg by mouth daily.      . benazepril (LOTENSIN) 10 MG tablet Take 10 mg by mouth daily.      . Calcium Carbonate-Vitamin D (CALCIUM-D) 600-400 MG-UNIT TABS Take 1 tablet by mouth daily.      . Cyanocobalamin (VITAMIN B-12 PO) Take by mouth.      . DULoxetine (CYMBALTA) 60 MG capsule  Take 60 mg by mouth daily.      Marland Kitchen estrogens, conjugated, (PREMARIN) 0.45 MG tablet Take 0.45 mg by mouth daily. Take daily for 21 days then do not take for 7 days.      Marland Kitchen levothyroxine (SYNTHROID, LEVOTHROID) 125 MCG tablet Take 125 mcg by mouth daily.      Marland Kitchen lidocaine (LIDODERM) 5 % Place 1 patch onto the skin every 12 (twelve) hours as needed. Remove & Discard patch within 12 hours or as directed by MD  30 patch  1  . Multiple Vitamin (MULTIVITAMIN) capsule Take 1 capsule by mouth daily.      . Omega-3 Fatty Acids (FISH OIL PO) Take 1 tablet by mouth daily.      . polyethylene glycol (MIRALAX / GLYCOLAX) packet Take 17 g by mouth as needed.      . propranolol (INDERAL) 20 MG tablet Take 20 mg by mouth daily.      . traZODone (DESYREL) 50 MG tablet Take 50 mg by mouth daily.         Review of Systems Review of Systems  Constitutional: Negative for fever, chills and unexpected weight change.  HENT: Negative for hearing loss, congestion, sore throat, trouble swallowing and voice change.   Eyes: Negative for visual disturbance.  Respiratory: Negative for cough and wheezing.   Cardiovascular: Negative for chest pain, palpitations and leg swelling.  Gastrointestinal: Positive for abdominal pain and constipation. Negative for nausea, vomiting, diarrhea, blood in stool, abdominal distention and anal bleeding.  Genitourinary: Negative for hematuria, vaginal bleeding and difficulty urinating.  Musculoskeletal: Negative for arthralgias.  Skin: Negative for rash and wound.  Neurological: Negative for seizures, syncope and headaches.  Hematological: Negative for adenopathy. Does not bruise/bleed easily.  Psychiatric/Behavioral: Negative for confusion. The patient is nervous/anxious.     Blood pressure 128/80, pulse 80, temperature 97.3 F (36.3 C), temperature source Temporal, resp. rate 18, height 5' 4.5" (1.638 m), weight 152 lb 6.4 oz (69.128 kg).  Physical Exam Physical Exam  Constitutional: She is oriented to person, place, and time. She appears well-developed and well-nourished. No distress.  HENT:  Head: Normocephalic and atraumatic.  Nose: Nose normal.  Mouth/Throat: No oropharyngeal exudate.  Eyes: Conjunctivae normal and EOM are normal. Pupils are equal, round, and reactive to light. Left eye exhibits no discharge. No scleral icterus.  Neck: Neck supple. No JVD present. No tracheal deviation present. No thyromegaly present.  Cardiovascular: Normal rate, regular rhythm, normal heart sounds and intact distal pulses.   No murmur heard. Pulmonary/Chest: Effort normal and breath sounds normal. No respiratory distress. She has no wheezes. She has no rales. She exhibits no tenderness.  Abdominal: Soft. Bowel sounds are normal. She exhibits no distension and no  mass. There is no tenderness. There is no rebound and no guarding.       Small umbilical hernia with incarcerated fatty tissue. Non-inflamed. Minimally tender on forceful examination. Defects certainly less than 2 cm. No mass otherwise detected.  Musculoskeletal: She exhibits no edema and no tenderness.  Lymphadenopathy:    She has no cervical adenopathy.  Neurological: She is alert and oriented to person, place, and time. She exhibits normal muscle tone. Coordination normal.  Skin: Skin is warm. No rash noted. She is not diaphoretic. No erythema. No pallor.  Psychiatric: She has a normal mood and affect. Her behavior is normal. Judgment and thought content normal.    Data Reviewed Imaging reports. Office notes from Dr. Leone Payor..  Assessment  Epigastric abdominal pain. Etiology unclear  Incarcerated umbilical hernia, small, probably asymptomatic  Anxiety and depression    Plan    We had a long talk about her symptoms and her umbilical hernia. I told her I was uncertain whether the umbilical  hernia was causing any of her symptoms, but most likely not. I told that she might have to have this repaired at some point in the future, but that I did not think it would relieve her symptoms of abdominal pain at this time.  We decided to go ahead with a gallbladder ultrasound and hepatobiliary scan with CCK stimulation to rule out gallbladder disease. She seemed in favor of this. She knows that her symptoms are not classic.  She'll return to see me after this is done for further discussion.      Angelia Mould. Derrell Lolling, M.D., [facility] General and Minimally invasive Surgery Breast and Colorectal Surgery Office:   [phone] Pager:   [phone]  08/18/2012, 4:48 PM

## 2012-08-28 ENCOUNTER — Other Ambulatory Visit (INDEPENDENT_AMBULATORY_CARE_PROVIDER_SITE_OTHER): Payer: Self-pay | Admitting: General Surgery

## 2012-08-28 DIAGNOSIS — R1013 Epigastric pain: Secondary | ICD-10-CM

## 2012-08-28 DIAGNOSIS — K420 Umbilical hernia with obstruction, without gangrene: Secondary | ICD-10-CM

## 2012-08-28 DIAGNOSIS — G8929 Other chronic pain: Secondary | ICD-10-CM

## 2012-08-31 ENCOUNTER — Encounter (HOSPITAL_COMMUNITY)
Admission: RE | Admit: 2012-08-31 | Discharge: 2012-08-31 | Disposition: A | Discharge status: Home / Self Care | Payer: Medicare Other | Source: Ambulatory Visit | Attending: General Surgery | Admitting: General Surgery

## 2012-08-31 ENCOUNTER — Ambulatory Visit (HOSPITAL_COMMUNITY): Payer: Medicare Other

## 2012-08-31 ENCOUNTER — Other Ambulatory Visit (INDEPENDENT_AMBULATORY_CARE_PROVIDER_SITE_OTHER): Payer: Self-pay | Admitting: General Surgery

## 2012-08-31 ENCOUNTER — Telehealth (INDEPENDENT_AMBULATORY_CARE_PROVIDER_SITE_OTHER): Payer: Self-pay | Admitting: General Surgery

## 2012-08-31 DIAGNOSIS — R1013 Epigastric pain: Secondary | ICD-10-CM | POA: Insufficient documentation

## 2012-08-31 DIAGNOSIS — G8929 Other chronic pain: Secondary | ICD-10-CM

## 2012-08-31 DIAGNOSIS — K420 Umbilical hernia with obstruction, without gangrene: Secondary | ICD-10-CM

## 2012-08-31 MED ORDER — TECHNETIUM TC 99M MEBROFENIN IV KIT
5.5000 | PACK | Freq: Once | INTRAVENOUS | Status: AC | PRN
  Administered 2012-08-31: 6 via INTRAVENOUS

## 2012-08-31 NOTE — Telephone Encounter (Signed)
Patient called in to question the test that was moved by [facility] Long to 09/04/12. Upon review the incorrect test was ordered when she was here at our office. It should have been an ultrasound abdomen complete in order to evaluate her gallbladder. Apologized to the patient and called [facility] to schedule the test. It will be done on 09/06/12 at 9:00.

## 2012-09-04 ENCOUNTER — Other Ambulatory Visit (HOSPITAL_COMMUNITY): Payer: Medicare Other

## 2012-09-04 ENCOUNTER — Telehealth (INDEPENDENT_AMBULATORY_CARE_PROVIDER_SITE_OTHER): Payer: Self-pay | Admitting: General Surgery

## 2012-09-04 NOTE — Telephone Encounter (Signed)
Pt called for results of HIDA scan done last week; gave her the results.  Normal scan with EF 38%.  She was pleased.

## 2012-09-06 ENCOUNTER — Ambulatory Visit (HOSPITAL_COMMUNITY)
Admission: RE | Admit: 2012-09-06 | Discharge: 2012-09-06 | Disposition: A | Discharge status: Home / Self Care | Payer: Medicare Other | Source: Ambulatory Visit | Attending: General Surgery | Admitting: General Surgery

## 2012-09-06 ENCOUNTER — Telehealth (INDEPENDENT_AMBULATORY_CARE_PROVIDER_SITE_OTHER): Payer: Self-pay | Admitting: General Surgery

## 2012-09-06 DIAGNOSIS — R109 Unspecified abdominal pain: Secondary | ICD-10-CM | POA: Insufficient documentation

## 2012-09-06 NOTE — Telephone Encounter (Signed)
Called patient per Dr. Jacinto Halim request and advised of U/S results. Confirmed patient has appointment to see Dr. Derrell Lolling next week on 09/11/12.

## 2012-09-11 ENCOUNTER — Ambulatory Visit (INDEPENDENT_AMBULATORY_CARE_PROVIDER_SITE_OTHER): Payer: Medicare Other | Admitting: General Surgery

## 2012-09-11 ENCOUNTER — Encounter (INDEPENDENT_AMBULATORY_CARE_PROVIDER_SITE_OTHER): Payer: Self-pay | Admitting: General Surgery

## 2012-09-11 VITALS — BP 132/78 | HR 72 | Temp 97.8°F | Ht 64.0 in | Wt 156.0 lb

## 2012-09-11 DIAGNOSIS — R109 Unspecified abdominal pain: Secondary | ICD-10-CM

## 2012-09-11 DIAGNOSIS — K429 Umbilical hernia without obstruction or gangrene: Secondary | ICD-10-CM | POA: Insufficient documentation

## 2012-09-11 DIAGNOSIS — G8929 Other chronic pain: Secondary | ICD-10-CM

## 2012-09-11 DIAGNOSIS — K420 Umbilical hernia with obstruction, without gangrene: Secondary | ICD-10-CM

## 2012-09-11 NOTE — Patient Instructions (Signed)
Your gallbladder ultrasound and hepatobiliary scan are completely normal. You do not have gallbladder disease.  Your abdominal pain is atypical for umbilical hernia, and may suggest irritable bowel syndrome or intestinal spasticity. Since you do not have any pain at your umbilicus, I do not advise umbilical hernia repair at this time nor  I do not think that an operation on your umbilical hernia would resolve your symptoms.  Return to see Dr. Derrell Lolling if the umbilical hernia becomes symptomatic.  Continue followup with Dr. Leone Payor regarding your abdominal pain.

## 2012-09-11 NOTE — Progress Notes (Signed)
Patient ID: Debbie Wells, female   DOB: Mar 31, 1941, 71 y.o.   MRN: [id number] History: This patient returns for further discussion of her abdominal pain. She states that she continues to have intermittent epigastric pain and left upper quadrant pain it waxes and wanes. This sometimes sudden. She states "I really think this is a bowel problem".  Reports a variable size and shape and consistency of bowel movements. She does not have any pain at her umbilicus. She has lots of questions.  CT scan earlier this year showed a small umbilical hernia containing preperitoneal fat. Gallbladder ultrasound is normal. CCK stimulated hepatobiliary scan is normal.  Exam: Patient looks well. Mild anxiety. Abdomen soft. Not distended. Small umbilical hernia. Not really tender unless  I push very hard. Reducible preperitoneal fat. Defect 1 cm or less.  Assessment: Abdominal pain of uncertain etiology. Extensive imaging and endoscopic workup to date without specific diagnosis. Symptom complex suspect suggest ureteral bowel syndrome or intestinal spasticity. Asymptomatic umbilical hernia. I do not think that repair of her umbilical hernia will resolve  her symptoms Anxiety and depression, chronic Headaches Hypertension Hypothyroidism  Plan: I advised against umbilical hernia repair this point in time since she appears to be asymptomatic, and this is unlikely to resolve her other  symptoms. She is in complete agreement. She'll return to see Dr. Leone Payor to discuss her GI complaints.  I told  her that there did not seem to be an indication for another colonoscopy at this point, however.               Return to see me if further problems arise.    Angelia Mould. Derrell Lolling, M.D., [facility] General and Minimally invasive Surgery Breast and Colorectal Surgery Office:   [phone] Pager:   [phone]

## 2012-11-06 ENCOUNTER — Telehealth: Payer: Self-pay | Admitting: Internal Medicine

## 2012-11-06 NOTE — Telephone Encounter (Signed)
Patient wants to schedule a direct colonoscopy.  Please see Dr. Jacinto Halim office visit from 09/11/12.  Do you want her to come in for an appt or direct colon?

## 2012-11-06 NOTE — Telephone Encounter (Signed)
I think it is best she see me in office before any procedures are done

## 2012-11-07 NOTE — Telephone Encounter (Signed)
Patient advised.  She will keep the appt for 11/28/12

## 2012-11-14 ENCOUNTER — Emergency Department (HOSPITAL_COMMUNITY)
Admission: EM | Admit: 2012-11-14 | Discharge: 2012-11-14 | Disposition: A | Discharge status: Home / Self Care | Payer: Medicare Other | Attending: Emergency Medicine | Admitting: Emergency Medicine

## 2012-11-14 ENCOUNTER — Encounter (HOSPITAL_COMMUNITY): Payer: Self-pay | Admitting: *Deleted

## 2012-11-14 DIAGNOSIS — E785 Hyperlipidemia, unspecified: Secondary | ICD-10-CM | POA: Insufficient documentation

## 2012-11-14 DIAGNOSIS — R61 Generalized hyperhidrosis: Secondary | ICD-10-CM | POA: Insufficient documentation

## 2012-11-14 DIAGNOSIS — F3289 Other specified depressive episodes: Secondary | ICD-10-CM | POA: Insufficient documentation

## 2012-11-14 DIAGNOSIS — Z85038 Personal history of other malignant neoplasm of large intestine: Secondary | ICD-10-CM | POA: Insufficient documentation

## 2012-11-14 DIAGNOSIS — Z87891 Personal history of nicotine dependence: Secondary | ICD-10-CM | POA: Insufficient documentation

## 2012-11-14 DIAGNOSIS — E039 Hypothyroidism, unspecified: Secondary | ICD-10-CM | POA: Insufficient documentation

## 2012-11-14 DIAGNOSIS — G8929 Other chronic pain: Secondary | ICD-10-CM | POA: Insufficient documentation

## 2012-11-14 DIAGNOSIS — F329 Major depressive disorder, single episode, unspecified: Secondary | ICD-10-CM | POA: Insufficient documentation

## 2012-11-14 DIAGNOSIS — F411 Generalized anxiety disorder: Secondary | ICD-10-CM | POA: Insufficient documentation

## 2012-11-14 DIAGNOSIS — R5381 Other malaise: Secondary | ICD-10-CM | POA: Insufficient documentation

## 2012-11-14 DIAGNOSIS — R42 Dizziness and giddiness: Secondary | ICD-10-CM | POA: Insufficient documentation

## 2012-11-14 DIAGNOSIS — Z8719 Personal history of other diseases of the digestive system: Secondary | ICD-10-CM | POA: Insufficient documentation

## 2012-11-14 DIAGNOSIS — Z7982 Long term (current) use of aspirin: Secondary | ICD-10-CM | POA: Insufficient documentation

## 2012-11-14 DIAGNOSIS — R109 Unspecified abdominal pain: Secondary | ICD-10-CM | POA: Insufficient documentation

## 2012-11-14 DIAGNOSIS — Z79899 Other long term (current) drug therapy: Secondary | ICD-10-CM | POA: Insufficient documentation

## 2012-11-14 DIAGNOSIS — I10 Essential (primary) hypertension: Secondary | ICD-10-CM | POA: Insufficient documentation

## 2012-11-14 LAB — BASIC METABOLIC PANEL
BUN: 15 mg/dL (ref 6–23)
Creatinine, Ser: 0.79 mg/dL (ref 0.50–1.10)
GFR calc Af Amer: >90 mL/min (ref >90)
GFR calc non Af Amer: 82 mL/min — ABNORMAL LOW (ref >90)
Glucose, Bld: 88 mg/dL (ref 70–99)

## 2012-11-14 LAB — CBC
HCT: 41.5 % (ref 36.0–46.0)
Hemoglobin: 13.9 g/dL (ref 12.0–15.0)
MCHC: 33.5 g/dL (ref 30.0–36.0)
MCV: 90.2 fL (ref 78.0–100.0)
RDW: 13.2 % (ref 11.5–15.5)

## 2012-11-14 NOTE — ED Notes (Signed)
Pt states yesterday was working out became dizzy, sweaty, pale but denies chest pain, when took blood pressure has been 180-200's/80-100's yesterday and today, today started having chest pain 4/10 around 5 pm, and Bayer aspirin taken around 5 pm, pt states was nauseous, denies vomiting, denies shortness of breath, numbness/tingling.

## 2012-11-14 NOTE — ED Notes (Signed)
Bed:WA19<BR> Expected date:<BR> Expected time:<BR> Means of arrival:<BR> Comments:<BR> Hold for triage

## 2012-11-14 NOTE — ED Notes (Signed)
Pt states bil upper arm pain

## 2012-11-14 NOTE — ED Provider Notes (Signed)
History     CSN: [id number]  Arrival date & time 11/14/12  1735   First MD Initiated Contact with Patient 11/14/12 1942      Chief Complaint  Patient presents with  . Nausea  . Fatigue    (Consider location/radiation/quality/duration/timing/severity/associated sxs/prior treatment) HPI Comments: Debbie Wells is a 72 y.o. Female who presents for evaluation of dizziness, diaphoresis, and hypertension. Symptoms started yesterday. The symptoms wax and wane. Her highest blood pressure at home. Today, was 205/88. Today, at her doctor's office the pressure was 178/88. Her doctor drew blood and asked her to follow up, in 2 weeks. She states that typically her blood pressure is in the range of 107/70. 2. Months ago her Dr. cut her benazepril dose, in half. This morning, she took a whole benazepril, then this evening after the elevated blood pressure at home; she took a half of benazepril. She has chronic, ongoing left-sided abdominal pain. She has seen a GI Dr., been evaluated, and told nothing else could be done. She is worried about this problem and wants to get a second opinion. She has had chest discomfort, very mild, today. No shortness of breath, or diaphoresis, today. She denies headache, now. There are no known modifying factors.  The history is provided by the patient.    Past Medical History  Diagnosis Date  . Anxiety   . Chronic headaches   . Adenomatous colon polyp   . Diverticulosis   . Depression   . Hyperlipidemia   . Hypertension   . Hypothyroidism   . Adenomatous polyp of colon 12/03/2004    6mm  . External hemorrhoids   . Umbilical hernia     Past Surgical History  Procedure Date  . Spinal lipoma removed 2006  . Abdominal hysterectomy 1975  . Appendectomy 1975  . Colonoscopy 03/03/2010,12/03/2004  . Oophorectomy   . Esophagogastroduodenoscopy 12/16/11    Dr. Stan Head    Family History  Problem Relation Age of Onset  . Esophageal cancer Father   . Stomach  cancer Father     mets from esophagus  . Heart disease Mother   . Irritable bowel syndrome Mother   . Kidney disease Mother   . Colon cancer Neg Hx     History  Substance Use Topics  . Smoking status: Former Games developer  . Smokeless tobacco: Never Used  . Alcohol Use: 1.8 oz/week    3 Glasses of wine per week     Comment: wine occasionally    OB History    Grav Para Term Preterm Abortions TAB SAB Ect Mult Living                  Review of Systems  All other systems reviewed and are negative.    Allergies  Hydrocodone-acetaminophen and Sulfonamide derivatives  Home Medications   Current Outpatient Rx  Name  Route  Sig  Dispense  Refill  . ALPRAZOLAM 0.5 MG PO TABS   Oral   Take 0.25-0.5 mg by mouth 2 (two) times daily. Pt takes a full tab at night and half a tab during day         . ASPIRIN 81 MG PO TABS   Oral   Take 81 mg by mouth daily.          . ATORVASTATIN CALCIUM 10 MG PO TABS   Oral   Take 10 mg by mouth daily.         Marland Kitchen BENAZEPRIL HCL 10 MG PO  TABS   Oral   Take 20 mg by mouth daily.          Marland Kitchen CALCIUM-D 600-400 MG-UNIT PO TABS   Oral   Take 1 tablet by mouth daily.         . DULOXETINE HCL 60 MG PO CPEP   Oral   Take 60 mg by mouth daily.         Marland Kitchen ESTROGENS CONJUGATED 0.45 MG PO TABS   Oral   Take 0.45 mg by mouth daily.          Marland Kitchen LEVOTHYROXINE SODIUM 125 MCG PO TABS   Oral   Take 125 mcg by mouth daily.         . MULTIVITAMINS PO CAPS   Oral   Take 1 capsule by mouth daily.         Marland Kitchen FISH OIL PO   Oral   Take 1 tablet by mouth daily.         Marland Kitchen POLYETHYLENE GLYCOL 3350 PO PACK   Oral   Take 17 g by mouth as needed. Constipation         . PROPRANOLOL HCL 20 MG PO TABS   Oral   Take 20 mg by mouth daily.         . TRAZODONE HCL 50 MG PO TABS   Oral   Take 50 mg by mouth daily.           BP 148/66  Pulse 64  Temp 97.8 F (36.6 C) (Oral)  Resp 18  SpO2 100%  Physical Exam  Nursing note and  vitals reviewed. Constitutional: She is oriented to person, place, and time. She appears well-developed and well-nourished.  HENT:  Head: Normocephalic and atraumatic.  Eyes: Conjunctivae normal and EOM are normal. Pupils are equal, round, and reactive to light.  Neck: Normal range of motion and phonation normal. Neck supple.  Cardiovascular: Normal rate, regular rhythm and intact distal pulses.   Pulmonary/Chest: Effort normal and breath sounds normal. She exhibits no tenderness.  Abdominal: Soft. She exhibits no distension. There is no tenderness. There is no guarding.  Musculoskeletal: Normal range of motion.  Neurological: She is alert and oriented to person, place, and time. She has normal strength. She exhibits normal muscle tone.  Skin: Skin is warm and dry.  Psychiatric: Her behavior is normal. Judgment and thought content normal.       She is anxious, and cried when talking about her abdominal pain    ED Course  Procedures (including critical care time)  Serial blood pressures in the ED, are noted     Date: 08/18/2012  Rate: 68  Rhythm: normal sinus rhythm  QRS Axis: normal  PR and QT Intervals: normal  ST/T Wave abnormalities: normal  PR and QRS Conduction Disutrbances:none  Narrative Interpretation:   Old EKG Reviewed: none available     Labs Reviewed  BASIC METABOLIC PANEL - Abnormal; Notable for the following:    GFR calc non Af Amer 82 (*)     All other components within normal limits  CBC  POCT I-STAT TROPONIN I   Nursing notes, applicable records and vitals reviewed.  Radiologic Images/Reports reviewed.    1. Hypertension       MDM  Hypertension, without signs of end organ damage. Patient stable for discharge. Doubt hypertensive urgency, ACS, metabolic instability, or impending vascular, collapse.        Flint Melter, MD 11/14/12 [phone]

## 2012-11-28 ENCOUNTER — Encounter: Payer: Self-pay | Admitting: Internal Medicine

## 2012-11-28 ENCOUNTER — Ambulatory Visit (INDEPENDENT_AMBULATORY_CARE_PROVIDER_SITE_OTHER)
Admission: RE | Admit: 2012-11-28 | Discharge: 2012-11-28 | Disposition: A | Discharge status: Home / Self Care | Payer: Medicare Other | Source: Ambulatory Visit | Attending: Internal Medicine | Admitting: Internal Medicine

## 2012-11-28 ENCOUNTER — Ambulatory Visit (INDEPENDENT_AMBULATORY_CARE_PROVIDER_SITE_OTHER): Payer: Medicare Other | Admitting: Internal Medicine

## 2012-11-28 ENCOUNTER — Other Ambulatory Visit (INDEPENDENT_AMBULATORY_CARE_PROVIDER_SITE_OTHER): Payer: Medicare Other

## 2012-11-28 VITALS — BP 202/100 | HR 72 | Ht 64.5 in | Wt 156.4 lb

## 2012-11-28 DIAGNOSIS — I10 Essential (primary) hypertension: Secondary | ICD-10-CM

## 2012-11-28 DIAGNOSIS — R10814 Left lower quadrant abdominal tenderness: Secondary | ICD-10-CM

## 2012-11-28 DIAGNOSIS — I160 Hypertensive urgency: Secondary | ICD-10-CM

## 2012-11-28 DIAGNOSIS — R1032 Left lower quadrant pain: Secondary | ICD-10-CM

## 2012-11-28 LAB — URINALYSIS, ROUTINE W REFLEX MICROSCOPIC
Bilirubin Urine: NEGATIVE
Ketones, ur: NEGATIVE
Leukocytes, UA: NEGATIVE
Specific Gravity, Urine: <=1.005 (ref 1.000–1.030)
Urine Glucose: NEGATIVE
pH: 6 (ref 5.0–8.0)

## 2012-11-28 LAB — BASIC METABOLIC PANEL
BUN: 13 mg/dL (ref 6–23)
CO2: 28 mEq/L (ref 19–32)
Calcium: 9.3 mg/dL (ref 8.4–10.5)
GFR: 78.58 mL/min (ref >60.00)
Glucose, Bld: 96 mg/dL (ref 70–99)
Potassium: 4.2 mEq/L (ref 3.5–5.1)

## 2012-11-28 MED ORDER — IOHEXOL 300 MG/ML  SOLN
100.0000 mL | Freq: Once | INTRAMUSCULAR | Status: AC | PRN
  Administered 2012-11-28: 100 mL via INTRAVENOUS

## 2012-11-28 NOTE — Patient Instructions (Addendum)
You have been scheduled for a CT scan of the abdomen and pelvis at [facility] ([address]---this is in the same building as [facility]).   You are scheduled today at 3:30pm. You should arrive 15 minutes prior to your appointment time for registration. Please follow the written instructions below on the day of your exam:  WARNING: IF YOU ARE ALLERGIC TO IODINE/X-RAY DYE, PLEASE NOTIFY RADIOLOGY IMMEDIATELY AT [phone]! YOU WILL BE GIVEN A 13 HOUR PREMEDICATION PREP.  1) Do not eat or drink anything after 11:30am (4 hours prior to your test) 2) You have been given 2 bottles of oral contrast to drink. The solution may taste  better if refrigerated, but do NOT add ice or any other liquid to this solution. Shake  well before drinking.    Drink 1 bottle of contrast @ 1:30pm (2 hours prior to your exam)  Drink 1 bottle of contrast @ 2:30pm (1 hour prior to your exam)  You may take any medications as prescribed with a small amount of water except for the following: Metformin, Glucophage, Glucovance, Avandamet, Riomet, Fortamet, Actoplus Met, Janumet, Glumetza or Metaglip. The above medications must be held the day of the exam AND 48 hours after the exam.  The purpose of you drinking the oral contrast is to aid in the visualization of your intestinal tract. The contrast solution may cause some diarrhea. Before your exam is started, you will be given a small amount of fluid to drink. Depending on your individual set of symptoms, you may also receive an intravenous injection of x-ray contrast/dye. Plan on being at [facility] for 30 minutes or long, depending on the type of exam you are having performed.  This test typically takes 30-45 minutes to complete.  If you have any questions regarding your exam or if you need to reschedule, you may call the CT department at [phone] between the hours of 8:00 am and 5:00 pm,  Monday-Friday.  ________________________________________________________________________  Your physician has requested that you go to the basement for lab work before leaving today.  We will call you with results and plans.  Thank you for choosing me and [facility].  Iva Boop, M.D., [facility]

## 2012-11-28 NOTE — Progress Notes (Signed)
Subjective:    Patient ID: Debbie Wells, female    DOB: 1941-04-29, 72 y.o.   MRN: [id number]  HPI The patient presents in followup. She continues to have a chronic left lower quadrant pain and it's not radiating into the epigastrium. He continues to frustrated her. She has pain with defecation. There is no bleeding. More recently she has developed hypertensive urgency problems and has been into the ER and also back to Dr. Evlyn Kanner with changes in her blood pressure medicines. She and her husband tell me that her adrenal gland was tested and that she does not have signs of Cushing's like her mother had. He has a headache today with her high blood pressure but no visual disturbances or other issues at this point.  Her quality of life remains poor because of his chronic left-sided abdominal pain. For completeness she saw Dr. Derrell Lolling of surgery because I noticed a small umbilical hernia before but he did not think that that was contributing to any of her pain.  She is wondering if she are not try a trial of acid suppression and she was on just a short 1 previously because she developed diarrhea with Dexilant. She remembers that she felt improved while on Xifaxan but not afterwards. Medications, allergies, past medical history, past surgical history, family history and social history are reviewed and updated in the EMR.  Review of Systems As above    Objective:   Physical Exam General:  NAD BP 200/100 right arm supine and 190/100 left arm supine Eyes:   Anicteric, not injected Heart:  S1S2 no rubs, murmurs or gallops Abdomen:  soft and tender LLQ, BS+   Data Reviewed:    Chemistry      Component Value Date/Time   NA 137 11/14/2012 1811   K 4.1 11/14/2012 1811   CL 100 11/14/2012 1811   CO2 27 11/14/2012 1811   BUN 15 11/14/2012 1811   CREATININE 0.79 11/14/2012 1811      Component Value Date/Time   CALCIUM 10.1 11/14/2012 1811     Lab Results  Component Value Date   WBC 6.7 11/14/2012   HGB 13.9 11/14/2012   HCT 41.5 11/14/2012   MCV 90.2 11/14/2012   PLT 229 11/14/2012   ED note Jan 2014 reviewed      Assessment & Plan:   1. LLQ pain  Basic Metabolic Panel (BMET), Urinalysis, Routine w reflex microscopic, Catecholamines, fractionated, urine, 24 hour, Metanephrines, Urine, 24 hour, CT Abdomen Pelvis W Contrast  2. LLQ abdominal tenderness  CT Abdomen Pelvis W Contrast  3. Hypertensive urgency  Basic Metabolic Panel (BMET), Urinalysis, Routine w reflex microscopic, Catecholamines, fractionated, urine, 24 hour, Metanephrines, Urine, 24 hour   This continues to be confusing case, perhaps a hypertensive urgency is a sign of something else, I wonder she could not have a pheochromocytoma perhaps. She could have occult diverticulitis. I had thought most of her abdominal pain and tenderness was usually abdominal wall pain. At this point the plans will be to:  1. Proceed with a CT abdomen pelvis with contrast 2. Repeat bmet, urinalysis today given the persistent hypertensive urgency. We'll also get urine for metanephrines and catecholamines. 3. Keep followup with Dr. Evlyn Kanner tomorrow 4. Depending upon what the CT shows, maybe she does need a discoid head and have a flexible sigmoidoscopy or colonoscopy. This is bothering her so much that I think there is potential utility in that it might help identify occult diverticulitis at that were present. Her  blood pressure would need to be under better control before sedation is appropriate 5. Other plans pending these results and clinical course, empiric PPI if not longer no my sense is it would not provide much benefit.  I appreciate the opportunity to care for this patient.  ZO:[id number] Hessie Diener, MD

## 2012-11-29 NOTE — Progress Notes (Signed)
Quick Note:  I sent her a my chart message - but call her and let her know this and labs are ok 24 hr urine studies are pending ______

## 2012-11-30 ENCOUNTER — Other Ambulatory Visit: Payer: Medicare Other

## 2012-11-30 DIAGNOSIS — I160 Hypertensive urgency: Secondary | ICD-10-CM

## 2012-11-30 DIAGNOSIS — R1032 Left lower quadrant pain: Secondary | ICD-10-CM

## 2012-12-04 LAB — CATECHOLAMINES, FRACTIONATED, URINE, 24 HOUR
Creatinine, Urine mg/day-CATEUR: 1.62 g/(24.h) (ref 0.63–2.50)
Dopamine, 24 hr Urine: 107 mcg/24 h (ref 52–480)
Epinephrine, 24 hr Urine: 5 mcg/24 h (ref 2–24)
Norepinephrine, 24 hr Ur: 52 mcg/24 h (ref 15–100)

## 2012-12-05 ENCOUNTER — Encounter: Payer: Self-pay | Admitting: Internal Medicine

## 2012-12-05 ENCOUNTER — Ambulatory Visit (INDEPENDENT_AMBULATORY_CARE_PROVIDER_SITE_OTHER): Payer: Medicare Other | Admitting: Cardiology

## 2012-12-05 ENCOUNTER — Encounter: Payer: Self-pay | Admitting: Cardiology

## 2012-12-05 ENCOUNTER — Ambulatory Visit: Payer: Medicare Other | Admitting: Cardiology

## 2012-12-05 VITALS — BP 140/90 | HR 64 | Resp 18 | Ht 66.0 in | Wt 155.6 lb

## 2012-12-05 DIAGNOSIS — I10 Essential (primary) hypertension: Secondary | ICD-10-CM

## 2012-12-05 DIAGNOSIS — E7800 Pure hypercholesterolemia, unspecified: Secondary | ICD-10-CM

## 2012-12-05 DIAGNOSIS — I119 Hypertensive heart disease without heart failure: Secondary | ICD-10-CM | POA: Insufficient documentation

## 2012-12-05 DIAGNOSIS — R079 Chest pain, unspecified: Secondary | ICD-10-CM | POA: Insufficient documentation

## 2012-12-05 LAB — METANEPHRINES, URINE, 24 HOUR: Metanephrines, Ur: 100 mcg/24 h (ref 90–315)

## 2012-12-05 NOTE — Progress Notes (Signed)
Quick Note:  Let her know these tests are ok - no indication of tumor releasing hormones that would raise BP or cause pain  If her BP is better we can schedule a colonoscopy re: change in bowels and abdominal pain - will need to see BP results and check that when she comes for previsit  I will cc Dr. Evlyn Kanner ______

## 2012-12-05 NOTE — Progress Notes (Signed)
Debbie Wells Date of Birth:  Sep 11, 1941 [facility] [address] [place], [place]  [address] [phone]         Fax   [phone]  History of Present Illness: This pleasant 72 year old woman is seen at the request of Dr. Ardyth Harps.  She is being seen because of recent problems with marked labile hypertension.  She has also been experiencing some chest pressure with bilateral upper arm aching which occurs with exertion.  She does not have any history of known ischemic heart disease.  She states that about 10 years ago we saw her in the office and at that point she had a stress test which she states was negative.  Those records are not currently available.  The patient has had a long history of mild hypertension and has been on long-term propranolol and has been on benazepril recently.  About 3 weeks ago she was [facility] [place] [facility] participating in an exercise class and she became extremely pale and diaphoretic and her heart was racing.  She went home and went to bed.  Later in the day she checked her blood pressure at home and there was extremely high in the range of 195/95.  The next day she saw one of Dr. Rinaldo Cloud associates in the office and the blood pressure was still high for her although improved from the day before.  Later that day the blood pressure rose again and she went to [facility] long emergency room she was observed for about 5 hours and her blood pressure gradually came down.  No new medications were given at that time.  Since then she has worn an outpatient 24-hour blood pressure monitor which showed wide swings in blood pressure.  She has also had a 24-hour urine collection for metanephrines which was normal.  She has also had blood work by Dr. Evlyn Kanner to check for Cushing's disease since it is of interest that the patient's mother had Cushing's syndrome.  The patient had a CT scan of the abdomen and pelvis on 11/28/12 which showed normal kidney size and  normal adrenals.  The patient had an echocardiogram on 09/17/11 which showed normal left ventricular function with an ejection fraction of 55-60% and mild mitral regurgitation. Her social history reveals that she is married.  She denies any domestic stress.  She is a grandmother.  Her children live in [place] and [place] area I believe. The patient does not smoke.  She drinks very infrequent glass of wine.  Current Outpatient Prescriptions  Medication Sig Dispense Refill  . ALPRAZolam (XANAX) 0.5 MG tablet Take 0.25-0.5 mg by mouth 2 (two) times daily. Pt takes a full tab at night and half a tab during day      . aspirin 81 MG tablet Take 81 mg by mouth daily.       Marland Kitchen atorvastatin (LIPITOR) 10 MG tablet Take 10 mg by mouth daily.      . benazepril (LOTENSIN) 10 MG tablet Take 20 mg by mouth daily.       . Calcium Carbonate-Vitamin D (CALCIUM-D) 600-400 MG-UNIT TABS Take 1 tablet by mouth daily.      . DULoxetine (CYMBALTA) 60 MG capsule Take 60 mg by mouth daily.      Marland Kitchen estrogens, conjugated, (PREMARIN) 0.45 MG tablet Take 0.45 mg by mouth daily.       Marland Kitchen levothyroxine (SYNTHROID, LEVOTHROID) 125 MCG tablet Take 125 mcg by mouth daily.      Marland Kitchen lubiprostone (  AMITIZA) 24 MCG capsule Take 24 mcg by mouth as needed.      . Multiple Vitamin (MULTIVITAMIN) capsule Take 1 capsule by mouth daily.      . Omega-3 Fatty Acids (FISH OIL PO) Take 1 tablet by mouth daily.      . polyethylene glycol (MIRALAX / GLYCOLAX) packet Take 17 g by mouth as needed. Constipation      . propranolol (INDERAL) 20 MG tablet Take 20 mg by mouth daily.      . traZODone (DESYREL) 50 MG tablet Take 50 mg by mouth daily.        Allergies  Allergen Reactions  . Hydrocodone-Acetaminophen     REACTION: rash  . Sulfonamide Derivatives     REACTION: rash    Patient Active Problem List  Diagnosis  . HEMORRHOIDS  . Chronic  upper abdominal pain  . Umbilical hernia    History  Smoking status  . Former Smoker    Smokeless tobacco  . Never Used    History  Alcohol Use No    Family History  Problem Relation Age of Onset  . Esophageal cancer Father   . Stomach cancer Father     mets from esophagus  . Heart disease Mother   . Irritable bowel syndrome Mother   . Kidney disease Mother   . Colon cancer Neg Hx   . Other Mother     cushings disease  . Addison's disease Mother     Review of Systems: Constitutional: no fever chills diaphoresis or fatigue or change in weight.  Head and neck: no hearing loss, no epistaxis, no photophobia or visual disturbance. Respiratory: No cough, shortness of breath or wheezing. Cardiovascular: No  peripheral edema,. Gastrointestinal: No abdominal distention, no abdominal pain, no change in bowel habits hematochezia or melena. Genitourinary: No dysuria, no frequency, no urgency, no nocturia. Musculoskeletal:No arthralgias, no back pain, no gait disturbance or myalgias. Neurological: No dizziness, no headaches, no numbness, no seizures, no syncope, no weakness, no tremors. Hematologic: No lymphadenopathy, no easy bruising. Psychiatric: No confusion, no hallucinations, no sleep disturbance.    Physical Exam: Filed Vitals:   12/05/12 1544  BP: 140/90  Pulse: 64  Resp: 18   the general appearance reveals a well-developed well-nourished woman in no distress.The head and neck exam reveals pupils equal and reactive.  Extraocular movements are full.  There is no scleral icterus.  The mouth and pharynx are normal.  The neck is supple.  The carotids reveal no bruits.  The jugular venous pressure is normal.  The  thyroid is not enlarged.  There is no lymphadenopathy.  The chest is clear to percussion and auscultation.  There are no rales or rhonchi.  Expansion of the chest is symmetrical.  The precordium is quiet.  The first heart sound is normal.  The second heart sound is physiologically split.  There is no murmur gallop rub or click.  There is no abnormal lift or  heave.  The abdomen is soft and nontender.  The bowel sounds are normal.  The liver and spleen are not enlarged.  There are no abdominal masses.  There are no abdominal bruits.  Extremities reveal good pedal pulses.  There is no phlebitis or edema.  There is no cyanosis or clubbing.  Strength is normal and symmetrical in all extremities.  There is no lateralizing weakness.  There are no sensory deficits.  The skin is warm and dry.  There is no rash.  EKG shows normal sinus  rhythm and is within normal limits.  There are no ischemic changes at rest.   Assessment / Plan: 1.  recent poorly controlled blood pressure with paroxysmal spikes.  She has had a fairly complete workup so far including normal urinary metanephrines.  We will also get a renal artery duplex to rule out renal artery stenosis.  She will continue her current medications for now pending results of these studies.  2.  exertional chest pressure with bilateral arm aching.  She has multiple risk factors for ischemic heart disease including family history and hypercholesterolemia and prior long-standing essential hypertension.  We will have her return for a treadmill Myoview stress test to evaluate these symptoms further.  Many thanks for the opportunity to see this pleasant woman with you again.  I will be in touch with you regarding the results of her studies.

## 2012-12-05 NOTE — Patient Instructions (Addendum)
Your physician recommends that you continue on your current medications as directed. Please refer to the Current Medication list given to you today.  Your physician has requested that you have en exercise stress myoview. For further information please visit [email or web address] Please follow instruction sheet, as given.  Your physician has requested that you have a renal artery duplex. During this test, an ultrasound is used to evaluate blood flow to the kidneys. Allow one hour for this exam. Do not eat after midnight the day before and avoid carbonated beverages. Take your medications as you usually do.  Return within a week of tests to go over with  Dr. Patty Sermons

## 2012-12-07 ENCOUNTER — Ambulatory Visit (HOSPITAL_COMMUNITY): Payer: Medicare Other | Attending: Cardiology | Admitting: Radiology

## 2012-12-07 VITALS — BP 137/71 | Ht 66.0 in | Wt 153.0 lb

## 2012-12-07 DIAGNOSIS — R0602 Shortness of breath: Secondary | ICD-10-CM

## 2012-12-07 DIAGNOSIS — R0609 Other forms of dyspnea: Secondary | ICD-10-CM | POA: Insufficient documentation

## 2012-12-07 DIAGNOSIS — E7800 Pure hypercholesterolemia, unspecified: Secondary | ICD-10-CM

## 2012-12-07 DIAGNOSIS — I10 Essential (primary) hypertension: Secondary | ICD-10-CM | POA: Insufficient documentation

## 2012-12-07 DIAGNOSIS — R079 Chest pain, unspecified: Secondary | ICD-10-CM | POA: Insufficient documentation

## 2012-12-07 DIAGNOSIS — R0989 Other specified symptoms and signs involving the circulatory and respiratory systems: Secondary | ICD-10-CM | POA: Insufficient documentation

## 2012-12-07 DIAGNOSIS — R000 Tachycardia, unspecified: Secondary | ICD-10-CM | POA: Insufficient documentation

## 2012-12-07 DIAGNOSIS — R002 Palpitations: Secondary | ICD-10-CM | POA: Insufficient documentation

## 2012-12-07 MED ORDER — TECHNETIUM TC 99M SESTAMIBI GENERIC - CARDIOLITE
11.0000 | Freq: Once | INTRAVENOUS | Status: AC | PRN
  Administered 2012-12-07: 11 via INTRAVENOUS

## 2012-12-07 MED ORDER — TECHNETIUM TC 99M SESTAMIBI GENERIC - CARDIOLITE
33.0000 | Freq: Once | INTRAVENOUS | Status: AC | PRN
  Administered 2012-12-07: 33 via INTRAVENOUS

## 2012-12-07 MED ORDER — REGADENOSON 0.4 MG/5ML IV SOLN
0.4000 mg | Freq: Once | INTRAVENOUS | Status: AC
  Administered 2012-12-07: 0.4 mg via INTRAVENOUS

## 2012-12-07 NOTE — Progress Notes (Signed)
[facility] SITE 3 NUCLEAR MED [address] [place], [place] [address] [phone]    Cardiology Nuclear Med Study  Debbie Wells is a 72 y.o. female     MRN : [id number]     DOB: [birth year]  Procedure Date: 12/07/2012  Nuclear Med Background Indication for Stress Test:  Evaluation for Ischemia History:  2012 Echo EF 55-60% Cardiac Risk Factors: Family History - CAD, History of Smoking, Hypertension and Lipids  Symptoms:  Chest Pain, Diaphoresis, DOE, Fatigue, Light-Headedness, Palpitations and Rapid HR   Nuclear Pre-Procedure Caffeine/Decaff Intake:  None NPO After: 8:00pm   Lungs:  clear O2 Sat: 97% on room air. IV 0.9% NS with Angio Cath:  22g  IV Site: R Antecubital  IV Started by:  Bonnita Levan, RN  Chest Size (in):  36 Cup Size: D  Height: 5\' 6"  (1.676 m)  Weight:  153 lb (69.4 kg)  BMI:  Body mass index is 24.69 kg/(m^2). Tech Comments:  Held Inderal > 24 hrs    Nuclear Med Study 1 or 2 day study: 1 day  Stress Test Type:  Lexiscan  Reading MD: Cassell Clement, MD  Order Authorizing Provider:  Cassell Clement, MD  Resting Radionuclide: Technetium 76m Sestamibi  Resting Radionuclide Dose: 11.0 mCi   Stress Radionuclide:  Technetium 37m Sestamibi  Stress Radionuclide Dose: 33.0 mCi           Stress Protocol Rest HR: 68 Stress HR: 120  Rest BP: 137/71 Stress BP: 135/86  Exercise Time (min): n/a METS: n/a   Predicted Max HR: 150 bpm % Max HR: 80 bpm Rate Pressure Product: 86578    Dose of Adenosine (mg):  n/a Dose of Lexiscan: 0.4 mg  Dose of Atropine (mg): n/a Dose of Dobutamine: n/a mcg/kg/min (at max HR)  Stress Test Technologist: Bonnita Levan, RN  Nuclear Technologist:  Domenic Polite, CNMT     Rest Procedure:  Myocardial perfusion imaging was performed at rest 45 minutes following the intravenous administration of Technetium 76m Sestamibi. Rest ECG: Patient arrived in atrial tachycardia 156/min (off her beta blocker) so was changed to  lexiscan protocol.  Stress Procedure:  The patient received IV Lexiscan 0.4 mg over 15-seconds.  Technetium 74m Sestamibi injected at 30-seconds.  Quantitative spect images were obtained after a 45 minute delay. Stress ECG: No significant ST segment change suggestive of ischemia.  QPS Raw Data Images:  Normal; no motion artifact; normal heart/lung ratio. Stress Images:  Normal homogeneous uptake in all areas of the myocardium. Rest Images:  Normal homogeneous uptake in all areas of the myocardium. Subtraction (SDS):  No evidence of ischemia. Transient Ischemic Dilatation (Normal <1.22):  1.22 Lung/Heart Ratio (Normal <0.45):  0.27  Quantitative Gated Spect Images QGS EDV:  73 ml QGS ESV:  29 ml  Impression Exercise Capacity:  Lexiscan with no exercise. BP Response:  Normal blood pressure response. Clinical Symptoms:  Mild chest pain/dyspnea. ECG Impression:  No significant ST segment change suggestive of ischemia. Comparison with Prior Nuclear Study: No images to compare  Overall Impression:  Normal stress nuclear study.  LV Ejection Fraction: 60%.  LV Wall Motion:  NL LV Function; NL Wall Motion  [employer]

## 2012-12-08 ENCOUNTER — Telehealth: Payer: Self-pay | Admitting: Cardiology

## 2012-12-08 MED ORDER — PROPRANOLOL HCL ER 80 MG PO CP24: 80.0000 mg | ORAL_CAPSULE | Freq: Every day | ORAL | Status: DC

## 2012-12-08 NOTE — Telephone Encounter (Signed)
New problem:  Test results.  

## 2012-12-08 NOTE — Telephone Encounter (Signed)
Message copied by Burnell Blanks on Fri Dec 08, 2012  5:49 PM ------      Message from: Cassell Clement      Created: Fri Dec 08, 2012  1:03 PM       Please report.  The nuclear stress test was normal.  There was no evidence of any blockage and her left ventricular function was normal.      For better blood pressure control I want to change her propranolol to propranolol 80 mg ER once a day.  She should continue to take her benazepril.  We should recheck for a followup office visit sometime after she has her artery duplex

## 2012-12-08 NOTE — Telephone Encounter (Signed)
Advised patient of myoview and medication change

## 2012-12-12 ENCOUNTER — Telehealth: Payer: Self-pay | Admitting: Cardiology

## 2012-12-12 NOTE — Telephone Encounter (Signed)
Discussed further with  Dr. Patty Sermons and will have patient increase her Benazepril from 20 mg in am and 10 mg in pm to 20 mg twice a day. Advised patient to call back if no better

## 2012-12-12 NOTE — Telephone Encounter (Signed)
Let's try increasing propranol to BID and see how she does.

## 2012-12-12 NOTE — Telephone Encounter (Signed)
New PRoblem:    Patient called in with a question about her propranolol ER (INDERAL LA) 80 MG 24 hr capsule.  Please call back.

## 2012-12-12 NOTE — Telephone Encounter (Signed)
Blood pressure is doing better late in the afternoon it goes up, but not like it was. Heart rate running below 60 and is sleeping all the time.  Should she give it a little more time or what. Will forward to  Dr. Patty Sermons for review

## 2012-12-16 ENCOUNTER — Other Ambulatory Visit: Payer: Self-pay

## 2012-12-18 ENCOUNTER — Encounter: Payer: Self-pay | Admitting: Cardiology

## 2012-12-18 ENCOUNTER — Encounter (INDEPENDENT_AMBULATORY_CARE_PROVIDER_SITE_OTHER): Payer: Medicare Other

## 2012-12-18 DIAGNOSIS — I10 Essential (primary) hypertension: Secondary | ICD-10-CM

## 2012-12-18 DIAGNOSIS — R109 Unspecified abdominal pain: Secondary | ICD-10-CM

## 2012-12-19 NOTE — Telephone Encounter (Signed)
Follow Up   Pt calling in to follow up on renal artery ultrasound results.

## 2012-12-20 NOTE — Telephone Encounter (Signed)
Advised patient no evidence of renal artery stenosis on duplex per  Dr. Patty Sermons.  Patient will discuss further at appointment on friday

## 2012-12-22 ENCOUNTER — Ambulatory Visit (INDEPENDENT_AMBULATORY_CARE_PROVIDER_SITE_OTHER): Payer: Medicare Other | Admitting: Cardiology

## 2012-12-22 ENCOUNTER — Ambulatory Visit (INDEPENDENT_AMBULATORY_CARE_PROVIDER_SITE_OTHER)
Admission: RE | Admit: 2012-12-22 | Discharge: 2012-12-22 | Disposition: A | Discharge status: Home / Self Care | Payer: Medicare Other | Source: Ambulatory Visit | Attending: Cardiology | Admitting: Cardiology

## 2012-12-22 VITALS — BP 134/70 | HR 57 | Ht 64.0 in | Wt 158.0 lb

## 2012-12-22 MED ORDER — HYDROCHLOROTHIAZIDE 12.5 MG PO CAPS: 12.5000 mg | ORAL_CAPSULE | Freq: Every day | ORAL | Status: DC

## 2012-12-22 NOTE — Progress Notes (Signed)
Debbie Wells Date of Birth:  1940/12/24 [facility] [address] [place], [place]  [address] [phone]         Fax   [phone]  History of Present Illness: This pleasant 72 year old woman is seen in followup of her symptoms of palpitations and poorly controlled hypertension.  She is a medical patient of Dr. Evlyn Kanner.  Her husband who is a retired Teacher, early years/pre accompanies her today. She is being seen because of recent problems with marked labile hypertension. She has also been experiencing some chest pressure with bilateral upper arm aching which occurs with exertion. She does not have any history of known ischemic heart disease. She states that about 10 years ago we saw her in the office and at that point she had a stress test which she states was negative. Those records are not currently available. The patient has had a long history of mild hypertension and has been on long-term propranolol and has been on benazepril recently. About 3 weeks ago she was [facility] the [place] [facility] participating in an exercise class and she became extremely pale and diaphoretic and her heart was racing. She went home and went to bed. Later in the day she checked her blood pressure at home and there was extremely high in the range of 195/95. The next day she saw one of Dr. Rinaldo Cloud associates in the office and the blood pressure was still high for her although improved from the day before. Later that day the blood pressure rose again and she went to [facility] long emergency room she was observed for about 5 hours and her blood pressure gradually came down. No new medications were given at that time. Since then she has worn an outpatient 24-hour blood pressure monitor which showed wide swings in blood pressure. She has also had a 24-hour urine collection for metanephrines which was normal. She has also had blood work by Dr. Evlyn Kanner to check for Cushing's disease since it is of interest that the patient's  mother had Cushing's syndrome. The patient had a CT scan of the abdomen and pelvis on 11/28/12 which showed normal kidney size and normal adrenals. The patient had an echocardiogram on 09/17/11 which showed normal left ventricular function with an ejection fraction of 55-60% and mild mitral regurgitation. Since her initial consultation visit several weeks ago the patient had a normal Lexa scan Myoview stress test on 12/07/12 which showed no evidence of ischemia.  Her ejection fraction was 60% and there were no wall motion abnormalities.  Following the stress test her Inderal dosage was increased to 80 mg daily.  Since then her palpitations have improved but her blood pressure has continued to fluctuate and has been running high particularly in the afternoon.  We also obtained renal artery Dopplers which showed that both renal arteries were widely patent and there was no evidence of renal artery stenosis.  There were elevated velocities noted in the superior mesenteric artery and inferior mesenteric artery and celiac axis, of uncertain significance.   Current Outpatient Prescriptions  Medication Sig Dispense Refill  . ALPRAZolam (XANAX) 0.5 MG tablet Take 0.25-0.5 mg by mouth 2 (two) times daily. Pt takes a full tab at night and half a tab during day      . aspirin 81 MG tablet Take 81 mg by mouth daily.       Marland Kitchen atorvastatin (LIPITOR) 10 MG tablet Take 10 mg by mouth daily.      . benazepril (LOTENSIN) 20 MG  tablet Take 20 mg by mouth as directed. 1/2 TABLET TWICE A DAY      . Calcium Carbonate-Vitamin D (CALCIUM-D) 600-400 MG-UNIT TABS Take 1 tablet by mouth daily.      . DULoxetine (CYMBALTA) 60 MG capsule Take 60 mg by mouth daily.      Marland Kitchen estrogens, conjugated, (PREMARIN) 0.45 MG tablet Take 0.45 mg by mouth daily.       Marland Kitchen levothyroxine (SYNTHROID, LEVOTHROID) 125 MCG tablet Take 125 mcg by mouth daily.      Marland Kitchen lubiprostone (AMITIZA) 24 MCG capsule Take 24 mcg by mouth as needed.      . Multiple Vitamin  (MULTIVITAMIN) capsule Take 1 capsule by mouth daily.      . Omega-3 Fatty Acids (FISH OIL PO) Take 1 tablet by mouth daily.      . polyethylene glycol (MIRALAX / GLYCOLAX) packet Take 17 g by mouth as needed. Constipation      . propranolol ER (INDERAL LA) 80 MG 24 hr capsule Take 1 capsule (80 mg total) by mouth daily.  30 capsule  5  . traZODone (DESYREL) 50 MG tablet Take 50 mg by mouth daily.      . hydrochlorothiazide (MICROZIDE) 12.5 MG capsule Take 1 capsule (12.5 mg total) by mouth daily.  90 capsule  3   No current facility-administered medications for this visit.    Allergies  Allergen Reactions  . Iodinated Diagnostic Agents Hives    Per patient she has developed hives and itching in upper trunk of body after last 2 CT scans once she got homes that she has just treated at home with benadryl. She has not told anyone until now. States she was able to bear it   . Hydrocodone-Acetaminophen     REACTION: rash  . Sulfonamide Derivatives     REACTION: rash    Patient Active Problem List  Diagnosis  . HEMORRHOIDS  . Chronic  upper abdominal pain  . Umbilical hernia  . Chest pain  . Benign hypertensive heart disease without heart failure  . Pure hypercholesterolemia    History  Smoking status  . Former Smoker  Smokeless tobacco  . Never Used    History  Alcohol Use No    Family History  Problem Relation Age of Onset  . Esophageal cancer Father   . Stomach cancer Father     mets from esophagus  . Heart disease Mother   . Irritable bowel syndrome Mother   . Kidney disease Mother   . Colon cancer Neg Hx   . Other Mother     cushings disease  . Addison's disease Mother     Review of Systems: Constitutional: no fever chills diaphoresis or fatigue or change in weight.  Head and neck: no hearing loss, no epistaxis, no photophobia or visual disturbance. Respiratory: No cough, shortness of breath or wheezing. Cardiovascular: No chest pain peripheral edema,  palpitations. Gastrointestinal: No abdominal distention, no abdominal pain, no change in bowel habits hematochezia or melena. Genitourinary: No dysuria, no frequency, no urgency, no nocturia. Musculoskeletal:No arthralgias, no back pain, no gait disturbance or myalgias. Neurological: No dizziness, no headaches, no numbness, no seizures, no syncope, no weakness, no tremors. Hematologic: No lymphadenopathy, no easy bruising. Psychiatric: No confusion, no hallucinations, no sleep disturbance.    Physical Exam: Filed Vitals:   12/22/12 1108  BP: 134/70  Pulse: 57   the general appearance reveals a somewhat anxious woman in no acute distress.The head and neck exam reveals pupils  equal and reactive.  Extraocular movements are full.  There is no scleral icterus.  The mouth and pharynx are normal.  The neck is supple.  The carotids reveal no bruits.  The jugular venous pressure is normal.  The  thyroid is not enlarged.  There is no lymphadenopathy.  The chest is clear to percussion and auscultation.  There are no rales or rhonchi.  Expansion of the chest is symmetrical.  The precordium is quiet.  The first heart sound is normal.  The second heart sound is physiologically split.  There is no murmur gallop rub or click.  There is no abnormal lift or heave.  The abdomen is soft and nontender.  The bowel sounds are normal.  The liver and spleen are not enlarged.  There are no abdominal masses.  There are no abdominal bruits.  Extremities reveal good pedal pulses.  There is no phlebitis or edema.  There is no cyanosis or clubbing.  Strength is normal and symmetrical in all extremities.  There is no lateralizing weakness.  There are no sensory deficits.  The skin is warm and dry.  There is no rash.     Assessment / Plan: At this point I am going to decrease her as it will down to 10 mg twice a day and I will add hydrochlorothiazide 12.5 mg one daily to smooth out her erratic blood pressure readings.  At this  point we will continue same dose of Inderal but if she continues to complain of lack of energy we may wish to cut that back.  We will plan to recheck him in 2 weeks for followup office visit and get a BMET at that time to be sure that her potassium is okay after starting HCTZ.  We will also get a chest x-ray since she states she has not had one in awhile

## 2012-12-22 NOTE — Patient Instructions (Addendum)
WILL HAVE YOU GO FOR A CHEST XRAY AT THE [facility] FROM   DECREASE YOUR BENAZAPRIL TO 10 MG TWICE A DAY  START HYDROCHLOROTHIAZIDE TO 12.5 MG DAILY  Your physician recommends that you schedule a follow-up appointment in: 2 WEEKS OV/BMET

## 2012-12-22 NOTE — Assessment & Plan Note (Signed)
Her blood pressure has been remaining higher.  Also at times she has felt somewhat dizzy and lightheaded.  She has been on bananas or pruritus 20 mg twice a day.  She has been concerned that she might be retaining fluid and her weight today is up 3 pounds since last visit.  She has not been on a diuretic and she feels that her urine volume has been decreasing. Because of her symptoms of lightheadedness I checked her for orthostasis myself today.  Her blood pressure supine is 132/82, sitting is 136/80, and standing is 136/80

## 2012-12-22 NOTE — Assessment & Plan Note (Signed)
The patient has had chronic upper abdominal discomfort under the left costochondral margin and has an appointment to see Dr. Leone Payor soon.  She is due for a colonoscopy.  She will also plan to ask him about the possible significance if any of the elevated velocities in the inferior mesenteric and superior mesenteric arteries and the celiac artery.

## 2012-12-27 ENCOUNTER — Telehealth: Payer: Self-pay | Admitting: *Deleted

## 2012-12-27 NOTE — Telephone Encounter (Signed)
Advised husband of results  

## 2012-12-27 NOTE — Telephone Encounter (Signed)
Message copied by Burnell Blanks on Wed Dec 27, 2012  9:15 AM ------      Message from: Cassell Clement      Created: Fri Dec 22, 2012  9:05 PM       Chest xray is normal.  Heart size normal.  No CHF ------

## 2013-01-02 ENCOUNTER — Telehealth: Payer: Self-pay

## 2013-01-02 NOTE — Telephone Encounter (Signed)
Patient had hives following last CT scan.  Do you want to proceed with CT angio with prednisone protocol or alternate testing?

## 2013-01-02 NOTE — Telephone Encounter (Signed)
Message copied by Annett Fabian on Tue Jan 02, 2013  4:00 PM ------      Message from: Stan Head E      Created: Mon Jan 01, 2013  1:01 PM      Regarding: ? abnormal blood vessels in abdomen       She had a renal US that showed elevated velocities of flow in the celiac and mesenteric vessels....may have poor blood flow there            I need her to get a CT angio of abdomen re: abdominal pain and abnormal blood flow in mesenteric vessels on Duplex US (Renal duplex US at [facility] ------

## 2013-01-02 NOTE — Telephone Encounter (Signed)
No - would not go that route   Let's have her get a mesenteric doppler study of the mesenteric arteries at [facility] instead  Similar to what she had done but looks specifically at the mesenteric arteries before and after food/liquid  Dr. Excell Seltzer reads these I think

## 2013-01-03 ENCOUNTER — Other Ambulatory Visit (INDEPENDENT_AMBULATORY_CARE_PROVIDER_SITE_OTHER): Payer: Medicare Other

## 2013-01-03 ENCOUNTER — Encounter: Payer: Self-pay | Admitting: Cardiology

## 2013-01-03 ENCOUNTER — Ambulatory Visit (INDEPENDENT_AMBULATORY_CARE_PROVIDER_SITE_OTHER): Payer: Medicare Other | Admitting: Cardiology

## 2013-01-03 VITALS — BP 140/82 | HR 58 | Ht 64.0 in | Wt 154.0 lb

## 2013-01-03 DIAGNOSIS — R0989 Other specified symptoms and signs involving the circulatory and respiratory systems: Secondary | ICD-10-CM

## 2013-01-03 DIAGNOSIS — G8929 Other chronic pain: Secondary | ICD-10-CM

## 2013-01-03 DIAGNOSIS — R079 Chest pain, unspecified: Secondary | ICD-10-CM

## 2013-01-03 DIAGNOSIS — I119 Hypertensive heart disease without heart failure: Secondary | ICD-10-CM

## 2013-01-03 MED ORDER — PROPRANOLOL HCL 20 MG PO TABS: 20.0000 mg | ORAL_TABLET | Freq: Two times a day (BID) | ORAL | Status: DC

## 2013-01-03 NOTE — Assessment & Plan Note (Signed)
The patient has been experiencing aching in her chest which is intermittent often lasting 5 minutes and then resolving on its own and then recurring.  It does not happen every day.  Her last echocardiogram on 09/17/11 had shown mild mitral regurgitation and good LV function.  We will have her repeat her echocardiogram.  Her recent nuclear stress test showed no evidence of ischemia.

## 2013-01-03 NOTE — Assessment & Plan Note (Signed)
She has done better since we added HCTZ 12.5 mg daily to her regimen.  Her blood pressure has been less labile.  Her systolic pressures have been normal and her diastolic blood pressures have been in the 50s.  She continues to complain of lack of energy which may be a result of the high-dose beta blocker.  We will reduce her beta blocker at this point to just 20 mg twice a day.  This may also help her sleep disturbance.

## 2013-01-03 NOTE — Patient Instructions (Addendum)
Your physician has requested that you have an echocardiogram. Echocardiography is a painless test that uses sound waves to create images of your heart. It provides your doctor with information about the size and shape of your heart and how well your heart's chambers and valves are working. This procedure takes approximately one hour. There are no restrictions for this procedure.  Your physician has recommended you make the following change in your medication:   Stop propranolol er Start propranolol 20 mg twice daily 12 hours apart  Your physician recommends that you schedule a follow-up appointment in: 6 weeks with dr Patty Sermons

## 2013-01-03 NOTE — Assessment & Plan Note (Signed)
Dr. Leone Payor is evaluating her for her left upper epigastric abdominal discomfort.

## 2013-01-03 NOTE — Telephone Encounter (Signed)
Discussed with Dr. Leone Payor need to wait on scheduling colonoscopy until these tests are completed.   Patient is scheduled for arterial mesenteric doppler for 01/18/13 11:30

## 2013-01-03 NOTE — Progress Notes (Signed)
Debbie Wells Date of Birth:  1941-05-04 [facility] [address] [place], [place]  [address] [phone]         Fax   [phone]  History of Present Illness: This pleasant 72 year old woman is seen in followup of her symptoms of palpitations and poorly controlled hypertension. She is a medical patient of Dr. Evlyn Kanner. Her husband who is a retired Teacher, early years/pre accompanies her today. She is being seen because of recent problems with marked labile hypertension. She has also been experiencing some chest pressure with bilateral upper arm aching which occurs with exertion. She does not have any history of known ischemic heart disease. She states that about 10 years ago we saw her in the office and at that point she had a stress test which she states was negative. Those records are not currently available. The patient has had a long history of mild hypertension and has been on long-term propranolol and has been on benazepril recently. About 3 weeks ago [facility] was [facility] [place] [facility] participating in an exercise class and she became extremely pale and diaphoretic and her heart was racing. She went home and went to bed. Later in the day she checked her blood pressure at home and there was extremely high in the range of 195/95. The next day she saw one of Dr. Rinaldo Cloud associates in the office and the blood pressure was still high for her although improved from the day before. Later that day the blood pressure rose again and she went to [facility] long emergency room she was observed for about 5 hours and her blood pressure gradually came down. No new medications were given at that time. Since then she has worn an outpatient 24-hour blood pressure monitor which showed wide swings in blood pressure. She has also had a 24-hour urine collection for metanephrines which was normal. She has also had blood work by Dr. Evlyn Kanner to check for Cushing's disease since it is of interest that the patient's  mother had Cushing's syndrome. The patient had a CT scan of the abdomen and pelvis on 11/28/12 which showed normal kidney size and normal adrenals. The patient had an echocardiogram on 09/17/11 which showed normal left ventricular function with an ejection fraction of 55-60% and mild mitral regurgitation.  Since her initial consultation visit several weeks ago the patient had a normal Lexa scan Myoview stress test on 12/07/12 which showed no evidence of ischemia. Her ejection fraction was 60% and there were no wall motion abnormalities. Following the stress test her Inderal dosage was increased to 80 mg daily. Since then her palpitations have improved but her blood pressure has continued to fluctuate and has been running high particularly in the afternoon. We also obtained renal artery Dopplers which showed that both renal arteries were widely patent and there was no evidence of renal artery stenosis. There were elevated velocities noted in the superior mesenteric artery and inferior mesenteric artery and celiac axis, of uncertain significance. Since last visit she has been on hydrochlorothiazide and her blood pressure has been more stable.  She has continued to have intermittent chest discomfort.  Current Outpatient Prescriptions  Medication Sig Dispense Refill  . ALPRAZolam (XANAX) 0.5 MG tablet Take 0.25-0.5 mg by mouth 2 (two) times daily. Pt takes a full tab at night and half a tab during day      . aspirin 81 MG tablet Take 81 mg by mouth daily.       Marland Kitchen atorvastatin (LIPITOR) 10  MG tablet Take 10 mg by mouth daily.      . benazepril (LOTENSIN) 20 MG tablet Take 20 mg by mouth as directed. 1/2 TABLET TWICE A DAY      . Calcium Carbonate-Vitamin D (CALCIUM-D) 600-400 MG-UNIT TABS Take 1 tablet by mouth daily.      . DULoxetine (CYMBALTA) 60 MG capsule Take 60 mg by mouth daily.      Marland Kitchen estrogens, conjugated, (PREMARIN) 0.45 MG tablet Take 0.45 mg by mouth daily.       . hydrochlorothiazide (MICROZIDE)  12.5 MG capsule Take 1 capsule (12.5 mg total) by mouth daily.  90 capsule  3  . levothyroxine (SYNTHROID, LEVOTHROID) 125 MCG tablet Take 125 mcg by mouth daily.      Marland Kitchen lubiprostone (AMITIZA) 24 MCG capsule Take 24 mcg by mouth as needed.      . Multiple Vitamin (MULTIVITAMIN) capsule Take 1 capsule by mouth daily.      . Omega-3 Fatty Acids (FISH OIL PO) Take 1 tablet by mouth daily.      . polyethylene glycol (MIRALAX / GLYCOLAX) packet Take 17 g by mouth as needed. Constipation      . traZODone (DESYREL) 50 MG tablet Take 50 mg by mouth daily.      . propranolol (INDERAL) 20 MG tablet Take 1 tablet (20 mg total) by mouth 2 (two) times daily.  60 tablet  5   No current facility-administered medications for this visit.    Allergies  Allergen Reactions  . Iodinated Diagnostic Agents Hives    Per patient she has developed hives and itching in upper trunk of body after last 2 CT scans once she got homes that she has just treated at home with benadryl. She has not told anyone until now. States she was able to bear it   . Hydrocodone-Acetaminophen     REACTION: rash  . Sulfonamide Derivatives     REACTION: rash    Patient Active Problem List  Diagnosis  . HEMORRHOIDS  . Chronic  upper abdominal pain  . Umbilical hernia  . Chest pain  . Benign hypertensive heart disease without heart failure  . Pure hypercholesterolemia    History  Smoking status  . Former Smoker  Smokeless tobacco  . Never Used    History  Alcohol Use No    Family History  Problem Relation Age of Onset  . Esophageal cancer Father   . Stomach cancer Father     mets from esophagus  . Heart disease Mother   . Irritable bowel syndrome Mother   . Kidney disease Mother   . Colon cancer Neg Hx   . Other Mother     cushings disease  . Addison's disease Mother     Review of Systems: Constitutional: no fever chills diaphoresis or fatigue or change in weight.  Head and neck: no hearing loss, no epistaxis,  no photophobia or visual disturbance. Respiratory: No cough, shortness of breath or wheezing. Cardiovascular: No chest pain peripheral edema, palpitations. Gastrointestinal: No abdominal distention, no abdominal pain, no change in bowel habits hematochezia or melena. Genitourinary: No dysuria, no frequency, no urgency, no nocturia. Musculoskeletal:No arthralgias, no back pain, no gait disturbance or myalgias. Neurological: No dizziness, no headaches, no numbness, no seizures, no syncope, no weakness, no tremors. Hematologic: No lymphadenopathy, no easy bruising. Psychiatric: No confusion, no hallucinations, no sleep disturbance.    Physical Exam: Filed Vitals:   01/03/13 1339  BP: 140/82  Pulse: 58   the  general appearance reveals a well-developed somewhat anxious woman in no distress.  Her husband is with her today.The head and neck exam reveals pupils equal and reactive.  Extraocular movements are full.  There is no scleral icterus.  The mouth and pharynx are normal.  The neck is supple.  The carotids reveal no bruits.  The jugular venous pressure is normal.  The  thyroid is not enlarged.  There is no lymphadenopathy.  The chest is clear to percussion and auscultation.  There are no rales or rhonchi.  Expansion of the chest is symmetrical.  The precordium is quiet.  The first heart sound is normal.  The second heart sound is physiologically split.  There is no  gallop rub or click.  There is a very faint systolic murmur at the left sternal edge  There is no abnormal lift or heave.  The abdomen is soft and nontender.  The bowel sounds are normal.  The liver and spleen are not enlarged.  There are no abdominal masses.  There are no abdominal bruits.  Extremities reveal good pedal pulses.  There is no phlebitis or edema.  There is no cyanosis or clubbing.  Strength is normal and symmetrical in all extremities.  There is no lateralizing weakness.  There are no sensory deficits.  The skin is warm and  dry.  There is no rash.  EKG today shows sinus bradycardia and otherwise within normal limits   Assessment / Plan: Continue same medication except reduced beta blocker to short-acting propranolol 20 mg twice a day breakfast and supper. Return soon for echocardiogram to evaluate chest pain further. Recheck in 6 weeks for followup office visit. Await results from GI workup with Dr. Leone Payor.

## 2013-01-10 ENCOUNTER — Ambulatory Visit (HOSPITAL_COMMUNITY): Payer: Medicare Other | Attending: Cardiovascular Disease

## 2013-01-10 DIAGNOSIS — R072 Precordial pain: Secondary | ICD-10-CM | POA: Insufficient documentation

## 2013-01-10 NOTE — Progress Notes (Signed)
Echocardiogram performed.  

## 2013-01-11 ENCOUNTER — Telehealth: Payer: Self-pay | Admitting: *Deleted

## 2013-01-11 NOTE — Telephone Encounter (Signed)
Message copied by Burnell Blanks on Thu Jan 11, 2013  5:33 PM ------      Message from: Cassell Clement      Created: Thu Jan 11, 2013 12:09 PM       Please report.  Her valve function is normal.  Systolic function is very good and ejection action is 60-65%.  There is mild diastolic dysfunction which could contribute to some exertional dyspnea.  This should improve with gradual increase in exercise and with keeping her blood pressure down going forward.  Continue same medicine. ------

## 2013-01-11 NOTE — Telephone Encounter (Signed)
Advised patient

## 2013-01-17 ENCOUNTER — Encounter (INDEPENDENT_AMBULATORY_CARE_PROVIDER_SITE_OTHER): Payer: Medicare Other

## 2013-01-18 ENCOUNTER — Telehealth: Payer: Self-pay | Admitting: Internal Medicine

## 2013-01-18 ENCOUNTER — Telehealth: Payer: Self-pay | Admitting: Cardiology

## 2013-01-18 NOTE — Telephone Encounter (Signed)
plz return call to patient [phone] regarding questions about test

## 2013-01-18 NOTE — Telephone Encounter (Signed)
Pt advised that we will contact her as soon as Dr Leone Payor has any recommendations

## 2013-01-18 NOTE — Telephone Encounter (Signed)
Left message to call back Monday if not urgent.

## 2013-01-19 ENCOUNTER — Other Ambulatory Visit: Payer: Self-pay

## 2013-01-19 DIAGNOSIS — K551 Chronic vascular disorders of intestine: Secondary | ICD-10-CM

## 2013-01-19 NOTE — Progress Notes (Signed)
Quick Note:  This study suggests that reduced blood flow to the intestines can be a cause of her abdominal pain. May still be difficult to be sure this is causing all abdominal pain but warrants further evaluation.  I would like her to see a vascular surgeon about this (Dr. Sondra Barges, [place], and partners) ______

## 2013-01-22 ENCOUNTER — Encounter: Payer: Self-pay | Admitting: Vascular Surgery

## 2013-01-22 ENCOUNTER — Telehealth: Payer: Self-pay | Admitting: Internal Medicine

## 2013-01-22 NOTE — Telephone Encounter (Signed)
The pt was told that she will speak with Dr Hart Rochester regarding the decreased blood flow to her intestine and any treatment options available.  The pt understood and thanked me for calling

## 2013-01-22 NOTE — Telephone Encounter (Signed)
F/u   Patient would like to speak with nurse regarding referral to Dr. Leone Payor

## 2013-01-23 ENCOUNTER — Ambulatory Visit (INDEPENDENT_AMBULATORY_CARE_PROVIDER_SITE_OTHER): Payer: Medicare Other | Admitting: Vascular Surgery

## 2013-01-23 ENCOUNTER — Encounter: Payer: Self-pay | Admitting: Vascular Surgery

## 2013-01-23 VITALS — BP 156/84 | HR 69 | Resp 16 | Ht 64.5 in | Wt 155.0 lb

## 2013-01-23 DIAGNOSIS — R1012 Left upper quadrant pain: Secondary | ICD-10-CM | POA: Insufficient documentation

## 2013-01-23 NOTE — Progress Notes (Signed)
Subjective:     Patient ID: Debbie Wells, female   DOB: 12/07/1940, 72 y.o.   MRN: [id number]  HPI this 72 year old female is referred by Dr. Leone Payor for evaluation of possible mesenteric ischemia. The patient has a history of left upper quadrant discomfort for 2 years. It is not postprandial in nature. She has no diarrhea. She has noted a change in the consistency of her bowel movements being described as "pencil thin". She has had no blood in her stools. She has had no weight loss. Pain has been persistent. She had a colonoscopy 3 years ago which was apparently unremarkable. She recently had a CT angiogram performed which revealed some narrowing in the proximal celiac axis but are SMA and IMA which seem to be widely patent. 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  Past Medical History  Diagnosis Date  . Anxiety   . Chronic headaches   . Adenomatous colon polyp   . Diverticulosis   . Depression   . Hyperlipidemia   . Hypertension   . Hypothyroidism   . Adenomatous polyp of colon 12/03/2004    6mm  . External hemorrhoids   . Umbilical hernia     History  Substance Use Topics  . Smoking status: Former Smoker    Types: Cigarettes    Quit date: 01/23/1993  . Smokeless tobacco: Never Used  . Alcohol Use: No    Family History  Problem Relation Age of Onset  . Esophageal cancer Father   . Stomach cancer Father     mets from esophagus  . Heart disease Mother   . Irritable bowel syndrome Mother   . Kidney disease Mother   . Colon cancer Neg Hx   . Other Mother     cushings disease  . Addison's disease Mother     Allergies  Allergen Reactions  . Iodinated Diagnostic Agents Hives    Per patient she has developed hives and itching in upper trunk of body after last 2 CT scans once she got homes that she has just treated at home with benadryl. She  has not told anyone until now. States she was able to bear it   . Hydrocodone-Acetaminophen     REACTION: rash  . Sulfonamide Derivatives     REACTION: rash    Current outpatient prescriptions:ALPRAZolam (XANAX) 0.5 MG tablet, Take 0.25-0.5 mg by mouth 2 (two) times daily. Pt takes a full tab at night and half a tab during day, Disp: , Rfl: ;  aspirin 81 MG tablet, Take 81 mg by mouth daily. , Disp: , Rfl: ;  atorvastatin (LIPITOR) 10 MG tablet, Take 10 mg by mouth daily., Disp: , Rfl: ;  benazepril (LOTENSIN) 20 MG tablet, Take 20 mg by mouth as directed. 1/2 TABLET TWICE A DAY, Disp: , Rfl:  Calcium Carbonate-Vitamin D (CALCIUM-D) 600-400 MG-UNIT TABS, Take 1 tablet by mouth daily., Disp: , Rfl: ;  DULoxetine (CYMBALTA) 60 MG capsule, Take 60 mg by mouth daily., Disp: , Rfl: ;  estrogens, conjugated, (PREMARIN) 0.45 MG tablet, Take 0.45 mg by mouth daily. , Disp: , Rfl: ;  hydrochlorothiazide (MICROZIDE) 12.5 MG capsule, Take 1 capsule (12.5 mg total) by mouth daily., Disp: 90 capsule, Rfl: 3 levothyroxine (SYNTHROID, LEVOTHROID) 125 MCG tablet, Take 125 mcg by mouth daily., Disp: , Rfl: ;  lubiprostone (AMITIZA) 24 MCG capsule, Take  24 mcg by mouth as needed., Disp: , Rfl: ;  Multiple Vitamin (MULTIVITAMIN) capsule, Take 1 capsule by mouth daily., Disp: , Rfl: ;  Omega-3 Fatty Acids (FISH OIL PO), Take 1 tablet by mouth daily., Disp: , Rfl:  polyethylene glycol (MIRALAX / GLYCOLAX) packet, Take 17 g by mouth as needed. Constipation, Disp: , Rfl: ;  propranolol (INDERAL) 20 MG tablet, Take 1 tablet (20 mg total) by mouth 2 (two) times daily., Disp: 60 tablet, Rfl: 5;  traZODone (DESYREL) 50 MG tablet, Take 50 mg by mouth daily., Disp: , Rfl:   BP 156/84  Pulse 69  Resp 16  Ht 5' 4.5" (1.638 m)  Wt 155 lb (70.308 kg)  BMI 26.2 kg/m2  Body mass index is 26.2 kg/(m^2).           Review of Systems has occasional chest discomfort, dyspnea on exertion, dizziness, and history of depression.  Denies claudication, hemoptysis, orthopnea, PND.      Objective:   Physical Exam blood pressure 156 a rate 4 heart rate 69 respirations 16 Gen.-alert and oriented x3 in no apparent distress HEENT normal for age Lungs no rhonchi or wheezing Cardiovascular regular rhythm no murmurs carotid pulses 3+ palpable no bruits audible Abdomen soft nontender no palpable masses-No bruit heard Musculoskeletal free of  major deformities Skin clear -no rashes Neurologic normal Lower extremities 3+ femoral and dorsalis pedis pulses palpable bilaterally with no edema  Today I reviewed the clinical records supplied by Dr. Leone Payor as well as the CT angiogram and the ultrasound report.         Assessment:     Chronic left upper quadrant abdominal pain-etiology unknown Symptoms do not sound like mesenteric ischemia with no postprandial component, no weight loss, no diarrhea.  Her pain is chronic and constant in the left upper quadrant. I reviewed her CT angiogram and do not see any evidence of significant stenosis in her SMA and her I&A is patent. I agree that she probably does have narrowing at the origin of her celiac axis do not think this is causing her symptoms With a widely patent SMA and a patent IMA-it would be extremely rare to have mesenteric ischemia     Plan:     I would not recommend angiography and or stenting or treatment of her celiac axis at this time-very low index of suspicion for mesenteric ischemia I discussed this at length with the patient and her husband Refer patient back to Dr. Leone Payor for further evaluation and treatment-possibly consider redo the colonoscopy Next study from a vascular standpoint would be a biplane abdominal aortogram-catheter study-but I would not recommend that at this time

## 2013-01-26 ENCOUNTER — Telehealth: Payer: Self-pay

## 2013-01-26 ENCOUNTER — Other Ambulatory Visit: Payer: Self-pay | Admitting: *Deleted

## 2013-01-26 MED ORDER — BENAZEPRIL HCL 20 MG PO TABS: 20.0000 mg | ORAL_TABLET | ORAL | Status: DC

## 2013-01-26 NOTE — Telephone Encounter (Signed)
Message copied by [place], Jaelan Rasheed E on Fri Jan 26, 2013 12:42 PM ------      Message from: Stan Head E      Created: Wed Jan 24, 2013  7:44 AM      Regarding: schedule colonoscopy       Tell her I am aware of Dr. Candie Chroman evaluation            I am ready to schedule colonoscopy            We can do that with pre-visit or an REV - her call            Dx will be change in bowel habits ------

## 2013-01-26 NOTE — Telephone Encounter (Signed)
Spoke to husband and he is going to have Benedict decide and call me back regarding her answer on an appointment for pre-visit or office visit with Dr. Leone Payor to discuss colonoscopy.

## 2013-02-01 ENCOUNTER — Encounter: Payer: Self-pay | Admitting: Internal Medicine

## 2013-02-01 NOTE — Telephone Encounter (Signed)
I have spoken to patient. She states that she thinks her husband forgot to tell her about our previous call. I have advised her of Dr Marvell Frappier recommendations. She states that she will go forward with previsit and colonoscopy at this time. She has been scheduled for previsit on 02/07/13 @ 3:30 pm and colonoscopy on 02/12/13 @ 10:30 am and she verbalizes understanding of this. Patient states that she is concerned regarding stomach "underdistention" seen on her last 2 CT scans. She states that Dr Leone Payor originally told her that this effect was possibly seen due to lack of dye to the stomach. However, since this has also shown on her most recent CT scan, she wonders if this could be a problem. Dr Leone Payor, please advise.Marland KitchenMarland Kitchen

## 2013-02-05 NOTE — Telephone Encounter (Signed)
Patient reminded that Dr Leone Payor did complete an endoscopy on her after her last CT scan and that endoscopy was normal. Therefore, no need to worry. Patient verbalizes understanding.

## 2013-02-05 NOTE — Telephone Encounter (Signed)
Please remind her that the EGd after CT last year when they reported this was ok So I do not think it is an issue

## 2013-02-07 ENCOUNTER — Ambulatory Visit (AMBULATORY_SURGERY_CENTER): Payer: Medicare Other | Admitting: *Deleted

## 2013-02-07 VITALS — Ht 64.0 in | Wt 154.2 lb

## 2013-02-07 DIAGNOSIS — R1032 Left lower quadrant pain: Secondary | ICD-10-CM

## 2013-02-07 DIAGNOSIS — R194 Change in bowel habit: Secondary | ICD-10-CM

## 2013-02-07 DIAGNOSIS — R198 Other specified symptoms and signs involving the digestive system and abdomen: Secondary | ICD-10-CM

## 2013-02-07 MED ORDER — NA SULFATE-K SULFATE-MG SULF 17.5-3.13-1.6 GM/177ML PO SOLN: ORAL | Status: DC

## 2013-02-12 ENCOUNTER — Ambulatory Visit (AMBULATORY_SURGERY_CENTER): Payer: Medicare Other | Admitting: Internal Medicine

## 2013-02-12 ENCOUNTER — Encounter: Payer: Self-pay | Admitting: Internal Medicine

## 2013-02-12 VITALS — BP 116/62 | HR 59 | Temp 97.9°F | Resp 15 | Ht 64.0 in | Wt 154.0 lb

## 2013-02-12 DIAGNOSIS — R1032 Left lower quadrant pain: Secondary | ICD-10-CM

## 2013-02-12 DIAGNOSIS — R198 Other specified symptoms and signs involving the digestive system and abdomen: Secondary | ICD-10-CM

## 2013-02-12 DIAGNOSIS — Z860100 Personal history of colon polyps, unspecified: Secondary | ICD-10-CM | POA: Insufficient documentation

## 2013-02-12 MED ORDER — SODIUM CHLORIDE 0.9 % IV SOLN: 500.0000 mL | INTRAVENOUS | Status: DC

## 2013-02-12 NOTE — Progress Notes (Signed)
Patient reports that she has had symptoms of urinary tract infection since Saturday, 02/10/13. Patient taking Pyridium. Patient questioned if Dr. Leone Payor would prescribe an antibiotic today.

## 2013-02-12 NOTE — Patient Instructions (Addendum)
You have diverticulosis of the colon as before - but no other abnormalities and no explanation for the pain.  I do think it is very possible some if not most of the pain is in the abdominal wall and believe you should a provider that can evaluate and treat that.  I will see if I can find one for you and let you know.  Thank you for choosing me and [facility].  Iva Boop, MD, FACG  YOU HAD AN ENDOSCOPIC PROCEDURE TODAY AT THE  ENDOSCOPY CENTER: Refer to the procedure report that was given to you for any specific questions about what was found during the examination.  If the procedure report does not answer your questions, please call your gastroenterologist to clarify.  If you requested that your care partner not be given the details of your procedure findings, then the procedure report has been included in a sealed envelope for you to review at your convenience later.  YOU SHOULD EXPECT: Some feelings of bloating in the abdomen. Passage of more gas than usual.  Walking can help get rid of the air that was put into your GI tract during the procedure and reduce the bloating. If you had a lower endoscopy (such as a colonoscopy or flexible sigmoidoscopy) you may notice spotting of blood in your stool or on the toilet paper. If you underwent a bowel prep for your procedure, then you may not have a normal bowel movement for a few days.  DIET: Your first meal following the procedure should be a light meal and then it is ok to progress to your normal diet.  A half-sandwich or bowl of soup is an example of a good first meal.  Heavy or fried foods are harder to digest and may make you feel nauseous or bloated.  Likewise meals heavy in dairy and vegetables can cause extra gas to form and this can also increase the bloating.  Drink plenty of fluids but you should avoid alcoholic beverages for 24 hours.  ACTIVITY: Your care partner should take you home directly after the procedure.  You  should plan to take it easy, moving slowly for the rest of the day.  You can resume normal activity the day after the procedure however you should NOT DRIVE or use heavy machinery for 24 hours (because of the sedation medicines used during the test).    SYMPTOMS TO REPORT IMMEDIATELY: A gastroenterologist can be reached at any hour.  During normal business hours, 8:30 AM to 5:00 PM Monday through Friday, call [phone].  After hours and on weekends, please call the GI answering service at [phone] who will take a message and have the physician on call contact you.   Following lower endoscopy (colonoscopy or flexible sigmoidoscopy):  Excessive amounts of blood in the stool  Significant tenderness or worsening of abdominal pains  Swelling of the abdomen that is new, acute  Fever of 100F or higher   FOLLOW UP: If any biopsies were taken you will be contacted by phone or by letter within the next 1-3 weeks.  Call your gastroenterologist if you have not heard about the biopsies in 3 weeks.  Our staff will call the home number listed on your records the next business day following your procedure to check on you and address any questions or concerns that you may have at that time regarding the information given to you following your procedure. This is a courtesy call and so if there is  no answer at the home number and we have not heard from you through the emergency physician on call, we will assume that you have returned to your regular daily activities without incident.  SIGNATURES/CONFIDENTIALITY: You and/or your care partner have signed paperwork which will be entered into your electronic medical record.  These signatures attest to the fact that that the information above on your After Visit Summary has been reviewed and is understood.  Full responsibility of the confidentiality of this discharge information lies with you and/or your care-partner.  Diverticulosis information given.

## 2013-02-12 NOTE — Progress Notes (Signed)
Patient did not experience any of the following events: a burn prior to discharge; a fall within the facility; wrong site/side/patient/procedure/implant event; or a hospital transfer or hospital admission upon discharge from the facility. (G8907) Patient did not have preoperative order for IV antibiotic SSI prophylaxis. (G8918)  

## 2013-02-12 NOTE — Progress Notes (Signed)
Patient stating she had an exacerbation of pain during and two days following the mesenteric ultrasound. "The wand was very painful."

## 2013-02-12 NOTE — Op Note (Signed)
[facility] [address] [place], [address]   COLONOSCOPY PROCEDURE REPORT  PATIENT: Debbie Wells, Debbie Wells  MR#: [id number] BIRTHDATE: 1941-01-16 , 72  yrs. old GENDER: Female ENDOSCOPIST: Iva Boop, MD, [facility] PROCEDURE DATE:  02/12/2013 PROCEDURE:   Colonoscopy, diagnostic ASA CLASS:   Class II INDICATIONS:Change in bowel habits and abdominal pain in the lower left quadrant. MEDICATIONS: propofol (Diprivan) 200mg  IV, These medications were titrated to patient response per physician's verbal order, and MAC sedation, administered by CRNA  DESCRIPTION OF PROCEDURE:   After the risks benefits and alternatives of the procedure were thoroughly explained, informed consent was obtained.  A digital rectal exam revealed no abnormalities of the rectum.   The LB [id number]  endoscope was introduced through the anus and advanced to the cecum, which was identified by both the appendix and ileocecal valve. No adverse events experienced.   The quality of the prep was Suprep excellent The instrument was then slowly withdrawn as the colon was fully examined.      COLON FINDINGS: Moderate diverticulosis was noted in the sigmoid colon.   The colon mucosa was otherwise normal.   A right colon retroflexion was performed.  Retroflexed views revealed no abnormalities. The time to cecum=1 minutes 55 seconds.  Withdrawal time=7 minutes 38 seconds.  The scope was withdrawn and the procedure completed. COMPLICATIONS: There were no complications.  ENDOSCOPIC IMPRESSION: 1.   Moderate diverticulosis was noted in the sigmoid colon 2.   The colon mucosa was otherwise normal  RECOMMENDATIONS: 1.  Will refer re: abdominal wall pain evaluation and treatment 2.  Repeat Colonscopy in 10 years.   eSigned:  Iva Boop, MD, [facility] 02/12/2013 11:18 AM   cc: Adrian Prince, MD and The Patient

## 2013-02-13 ENCOUNTER — Telehealth: Payer: Self-pay

## 2013-02-13 DIAGNOSIS — R109 Unspecified abdominal pain: Secondary | ICD-10-CM

## 2013-02-13 NOTE — Telephone Encounter (Signed)
Referral is faxed to pain clinic.  Preferred pain management notes faxed to [phone].  The patient and her husband are aware that the clinic will contact them directly with the appt once reviewed.

## 2013-02-13 NOTE — Telephone Encounter (Signed)
  Follow up Call-  Call back number 02/12/2013 12/16/2011  Post procedure Call Back phone  # [phone]  Permission to leave phone message Yes Yes     Patient questions:  Do you have a fever, pain , or abdominal swelling? no Pain Score  0 * Believes she has a UTI; wanted antibiotic from Dr Leone Payor.  RN advised she see her primary physician for UTI.   Have you tolerated food without any problems? yes  Have you been able to return to your normal activities? no  Do you have any questions about your discharge instructions: Diet   no Medications  no Follow up visit  no  Do you have questions or concerns about your Care? no  Actions: * If pain score is 4 or above: No action needed, pain <4.

## 2013-02-14 ENCOUNTER — Ambulatory Visit (INDEPENDENT_AMBULATORY_CARE_PROVIDER_SITE_OTHER): Payer: Medicare Other | Admitting: Cardiology

## 2013-02-14 ENCOUNTER — Encounter: Payer: Self-pay | Admitting: Cardiology

## 2013-02-14 VITALS — BP 126/74 | HR 59 | Ht 64.5 in | Wt 155.2 lb

## 2013-02-14 DIAGNOSIS — Z86010 Personal history of colonic polyps: Secondary | ICD-10-CM

## 2013-02-14 DIAGNOSIS — R079 Chest pain, unspecified: Secondary | ICD-10-CM

## 2013-02-14 DIAGNOSIS — I119 Hypertensive heart disease without heart failure: Secondary | ICD-10-CM

## 2013-02-14 NOTE — Assessment & Plan Note (Signed)
Blood pressure is stable on current therapy but she complains of lack of energy.  She would like to try reducing the evening dose of and around down to 10 mg instead of 20 mg.  This will be okay to try. Also we will allow her to resume her exercise program at the [employer] gymnasium.

## 2013-02-14 NOTE — Assessment & Plan Note (Signed)
Patient states that she has had a very significant improvement in her chronic chest pain since getting her blood pressure under control and being on her beta blocker

## 2013-02-14 NOTE — Telephone Encounter (Signed)
Patient had ov with  Dr. Patty Sermons today

## 2013-02-14 NOTE — Assessment & Plan Note (Signed)
Since last visit the patient had her colonoscopy with Dr. Leone Payor and no serious problems found according to patient

## 2013-02-14 NOTE — Patient Instructions (Addendum)
DECREASE YOUR INDERAL TO 20 MG IN THE MORNING AND 10 MG IN THE EVENING  OK TO RESUME EXERCISE PROGRAM AT GYM  Your physician recommends that you schedule a follow-up appointment in: 2 month ov/ekg

## 2013-02-14 NOTE — Progress Notes (Signed)
Debbie Wells Date of Birth:  25-Jun-1941 [facility] [address] [place], [place]  [address] [phone]         Fax   [phone]  History of Present Illness: This pleasant 72 year old woman is seen in followup of her symptoms of palpitations and poorly controlled hypertension. She is a medical patient of Dr. Evlyn Kanner. Her husband who is a retired Teacher, early years/pre accompanies her today. She is being seen because of recent problems with marked labile hypertension. She has also been experiencing some chest pressure with bilateral upper arm aching which occurs with exertion. She does not have any history of known ischemic heart disease. She states that about 10 years ago we saw her in the office and at that point she had a stress test which she states was negative. Those records are not currently available. The patient has had a long history of mild hypertension and has been on long-term propranolol and has been on benazepril recently. About 3 weeks ago she was at the [place] [facility] participating in an exercise class and she became extremely pale and diaphoretic and her heart was racing. She went home and went to bed. Later in the day she checked her blood pressure at home and there was extremely high in the range of 195/95. The next day she saw one of Dr. Rinaldo Cloud associates in the office and the blood pressure was still high for her although improved from the day before. Later that day the blood pressure rose again and she went to [facility] long emergency room she was observed for about 5 hours and her blood pressure gradually came down. No new medications were given at that time. Since then she has worn an outpatient 24-hour blood pressure monitor which showed wide swings in blood pressure. She has also had a 24-hour urine collection for metanephrines which was normal. She has also had blood work by Dr. Evlyn Kanner to check for Cushing's disease since it is of interest that the patient's  mother had Cushing's syndrome. The patient had a CT scan of the abdomen and pelvis on 11/28/12 which showed normal kidney size and normal adrenals. The patient had an echocardiogram on 09/17/11 which showed normal left ventricular function with an ejection fraction of 55-60% and mild mitral regurgitation.  Since her initial consultation visit several weeks ago the patient had a normal Lexa scan Myoview stress test on 12/07/12 which showed no evidence of ischemia. Her ejection fraction was 60% and there were no wall motion abnormalities. Following the stress test her Inderal dosage was increased to 80 mg daily.  She has had a good response to Inderal and we have been able to gradually reduce the dose.  She presently is on 20 mg twice a day and is feeling much better but still has lack of energy.  Current Outpatient Prescriptions  Medication Sig Dispense Refill  . ALPRAZolam (XANAX) 0.5 MG tablet Take 0.25-0.5 mg by mouth 2 (two) times daily. Pt takes a full tab at night and half a tab during day      . aspirin 81 MG tablet Take 81 mg by mouth daily.       Marland Kitchen atorvastatin (LIPITOR) 10 MG tablet Take 10 mg by mouth daily.      . benazepril (LOTENSIN) 20 MG tablet Take 1 tablet (20 mg total) by mouth as directed. 1/2 TABLET TWICE A DAY  45 tablet  3  . Calcium Carbonate-Vitamin D (CALCIUM-D) 600-400 MG-UNIT TABS Take 1 tablet by  mouth daily.      . DULoxetine (CYMBALTA) 60 MG capsule Take 60 mg by mouth daily.      Marland Kitchen estrogens, conjugated, (PREMARIN) 0.45 MG tablet Take 0.45 mg by mouth daily.       . hydrochlorothiazide (MICROZIDE) 12.5 MG capsule Take 1 capsule (12.5 mg total) by mouth daily.  90 capsule  3  . levothyroxine (SYNTHROID, LEVOTHROID) 125 MCG tablet Take 125 mcg by mouth daily.      Marland Kitchen LOTEMAX 0.5 % ophthalmic suspension 1 drop daily.      Marland Kitchen lubiprostone (AMITIZA) 24 MCG capsule Take 24 mcg by mouth as needed.      . Multiple Vitamin (MULTIVITAMIN) capsule Take 1 capsule by mouth daily.      .  Omega-3 Fatty Acids (FISH OIL PO) Take 1 tablet by mouth daily.      . polyethylene glycol (MIRALAX / GLYCOLAX) packet Take 17 g by mouth as needed. Constipation      . propranolol (INDERAL) 20 MG tablet Take 20 mg by mouth as directed. 1 in the morning and 1/2 in the evening      . traZODone (DESYREL) 50 MG tablet Take 50 mg by mouth daily.       No current facility-administered medications for this visit.    Allergies  Allergen Reactions  . Iodinated Diagnostic Agents Hives    Per patient she has developed hives and itching in upper trunk of body after last 2 CT scans once she got homes that she has just treated at home with benadryl. She has not told anyone until now. States she was able to bear it   . Hydrocodone-Acetaminophen     Vicodin=REACTION: rash  . Sulfonamide Derivatives     REACTION: rash    Patient Active Problem List  Diagnosis  . HEMORRHOIDS  . Chronic  upper abdominal pain  . Umbilical hernia  . Chest pain  . Benign hypertensive heart disease without heart failure  . Pure hypercholesterolemia  . Abdominal pain, left upper quadrant  . Personal history of colonic adenoma    History  Smoking status  . Former Smoker  . Types: Cigarettes  . Quit date: 01/23/1993  Smokeless tobacco  . Never Used    History  Alcohol Use  . 1.2 oz/week  . 2 Glasses of wine per week    Family History  Problem Relation Age of Onset  . Esophageal cancer Father   . Stomach cancer Father     mets from esophagus  . Heart disease Mother   . Irritable bowel syndrome Mother   . Kidney disease Mother   . Colon cancer Neg Hx   . Other Mother     cushings disease  . Addison's disease Mother     Review of Systems: Constitutional: no fever chills diaphoresis or fatigue or change in weight.  Head and neck: no hearing loss, no epistaxis, no photophobia or visual disturbance. Respiratory: No cough, shortness of breath or wheezing. Cardiovascular: No chest pain peripheral edema,  palpitations. Gastrointestinal: No abdominal distention, no abdominal pain, no change in bowel habits hematochezia or melena. Genitourinary: No dysuria, no frequency, no urgency, no nocturia. Musculoskeletal:No arthralgias, no back pain, no gait disturbance or myalgias. Neurological: No dizziness, no headaches, no numbness, no seizures, no syncope, no weakness, no tremors. Hematologic: No lymphadenopathy, no easy bruising. Psychiatric: No confusion, no hallucinations, no sleep disturbance.    Physical Exam: Filed Vitals:   02/14/13 1413  BP: 126/74  Pulse: 59  general appearance reveals a well-developed well-nourished woman in no distress.The head and neck exam reveals pupils equal and reactive.  Extraocular movements are full.  There is no scleral icterus.  The mouth and pharynx are normal.  The neck is supple.  The carotids reveal no bruits.  The jugular venous pressure is normal.  The  thyroid is not enlarged.  There is no lymphadenopathy.  The chest is clear to percussion and auscultation.  There are no rales or rhonchi.  Expansion of the chest is symmetrical.  The precordium is quiet.  The first heart sound is normal.  The second heart sound is physiologically split.  There is no murmur gallop rub or click.  There is no abnormal lift or heave.  The abdomen is soft and nontender.  The bowel sounds are normal.  The liver and spleen are not enlarged.  There are no abdominal masses.  There are no abdominal bruits.  Extremities reveal good pedal pulses.  There is no phlebitis or edema.  There is no cyanosis or clubbing.  Strength is normal and symmetrical in all extremities.  There is no lateralizing weakness.  There are no sensory deficits.  The skin is warm and dry.  There is no rash.    Assessment / Plan: Same medication except try cutting back further on Inderal to just 10 mg in the evening and continuing 20 mg in the morning. Recheck in 2 months for followup office visit and EKG

## 2013-02-26 NOTE — Telephone Encounter (Signed)
I contacted the pain clinic this am and they have attempted to reach the patient to schedule the appt.  She has not returned their calls.  I have left a message for the patient to call back to discuss

## 2013-02-27 NOTE — Telephone Encounter (Signed)
I discussed with the patient and she is not interested in seeing Pain management at this time.  Her pain has resolved almost completely.  She will call back if she changes her mind.  She declines appt.

## 2013-04-03 ENCOUNTER — Encounter: Payer: Self-pay | Admitting: Internal Medicine

## 2013-04-03 NOTE — Telephone Encounter (Signed)
Error

## 2013-04-12 ENCOUNTER — Encounter: Payer: Self-pay | Admitting: Internal Medicine

## 2013-04-17 ENCOUNTER — Encounter: Payer: Self-pay | Admitting: Internal Medicine

## 2013-04-17 ENCOUNTER — Telehealth: Payer: Self-pay | Admitting: Cardiology

## 2013-04-17 ENCOUNTER — Encounter: Payer: Self-pay | Admitting: Vascular Surgery

## 2013-04-17 NOTE — Telephone Encounter (Signed)
Left message to call back  

## 2013-04-17 NOTE — Telephone Encounter (Signed)
Spoke with pt. She reports lack of energy that has been going on for about 6 months. Tires easily. She is concerned about mesenteric artery studies done recently and wanted to make sure Dr. Patty Sermons was aware of results. These were ordered by Dr. Leone Payor.  She has seen vascular surgery for evaluation. She states she is still having problems with blood pressure not being stable. Reports readings are "all over the place."  Usually runs 130-150/60. Heart rate runs 50-60.Sometimes will drop to 110/60.  Today blood pressure was 170/75. She has appt with Dr. Patty Sermons on May 01, 2013 but wanted to make him aware of recent issues and see if any changes need to be made prior to upcoming appt.

## 2013-04-17 NOTE — Telephone Encounter (Signed)
Pt is aware of MD's recommendations. Pt will keep her appointment on July 1st.

## 2013-04-17 NOTE — Telephone Encounter (Signed)
New Problem:    Patient called in because she is still having a lot of BP discrepancies and she is not feeling well.  Also wanted please review the mesenteric artery studies from 01/19/13.  Please call back.

## 2013-04-17 NOTE — Telephone Encounter (Signed)
Yes I am aware of mesenteric study results. If her BP systolic rises above 160 she should take an extra half tablet of propranolol. Otherwise keep appt on July 1 as scheduled.

## 2013-05-01 ENCOUNTER — Ambulatory Visit: Payer: Medicare Other | Admitting: Cardiology

## 2013-05-28 ENCOUNTER — Encounter: Payer: Self-pay | Admitting: Cardiology

## 2013-05-28 ENCOUNTER — Ambulatory Visit (INDEPENDENT_AMBULATORY_CARE_PROVIDER_SITE_OTHER): Payer: Medicare Other | Admitting: Cardiology

## 2013-05-28 VITALS — BP 138/82 | HR 59 | Ht 64.5 in | Wt 157.0 lb

## 2013-05-28 DIAGNOSIS — R079 Chest pain, unspecified: Secondary | ICD-10-CM

## 2013-05-28 DIAGNOSIS — R1012 Left upper quadrant pain: Secondary | ICD-10-CM

## 2013-05-28 DIAGNOSIS — I119 Hypertensive heart disease without heart failure: Secondary | ICD-10-CM

## 2013-05-28 MED ORDER — METOPROLOL SUCCINATE ER 25 MG PO TB24: 25.0000 mg | ORAL_TABLET | Freq: Every day | ORAL | Status: DC

## 2013-05-28 NOTE — Assessment & Plan Note (Signed)
The patient has spoken with Dr. 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

## 2013-05-28 NOTE — Assessment & Plan Note (Signed)
Blood pressure has been reasonably well controlled recently.  She is no longer having the marked systolic hypertension surges that she used to experience.  Her energy level however is decreased and she is asking whether a different beta blocker could be used.  She has been on long-term propranolol.  We will try stopping propranolol and switching to Toprol XL 25 one daily.  Depending on response we may have to increase or decrease the dose.  The Toprol should cause less malaise and fatigue

## 2013-05-28 NOTE — Progress Notes (Signed)
Debbie Wells Date of Birth:  1941/09/17 [facility] [address] [place], [place]  [address] [phone]         Fax   [phone]  History of Present Illness: This pleasant 72 year old married Caucasian female is seen for a 3 month followup office visit.  We had initially seen her at the request of Dr. Evlyn Kanner for evaluation of palpitations and poorly controlled high blood pressure she had previously worn an outpatient 24-hour blood pressure monitor which showed wide swings of blood pressure she has had a 24-hour urine collection for metanephrines which was normal. She has had blood work in Dr. Enid Derry office which has ruled out Cushing's syndrome her cardiac workup as included a Lexa scan Myoview stress test on 12/07/12 showing no evidence of ischemia and her ejection fraction was 60% and there were no wall motion abnormalities.  She had a previous echocardiogram 09/17/11 showing normal LV function and mild mitral regurgitation she has had problems with chronic steady pain in the left epigastrium and left upper quadrant and has seen her gastroenterologist Dr. Leone Payor.  She has seen Dr. Jerilee Field regarding question of mesenteric and or celiac artery stenosis as a possible cause of her left upper quadrant pain.  Dr. Hart Rochester did not feel that the quality of her pain suggested mesenteric ischemia. The patient complains of fatigue.   Current Outpatient Prescriptions  Medication Sig Dispense Refill  . ALPRAZolam (XANAX) 0.5 MG tablet Take 0.25-0.5 mg by mouth 2 (two) times daily. Pt takes a full tab at night and half a tab during day      . aspirin 81 MG tablet Take 81 mg by mouth daily.       Marland Kitchen atorvastatin (LIPITOR) 10 MG tablet Take 10 mg by mouth daily.      . benazepril (LOTENSIN) 20 MG tablet Take 1 tablet (20 mg total) by mouth as directed. 1/2 TABLET TWICE A DAY  45 tablet  3  . Calcium Carbonate-Vitamin D (CALCIUM-D) 600-400 MG-UNIT TABS Take 1 tablet by mouth daily.       . DULoxetine (CYMBALTA) 60 MG capsule Take 60 mg by mouth daily.      Marland Kitchen estrogens, conjugated, (PREMARIN) 0.45 MG tablet Take 0.45 mg by mouth daily.       . hydrochlorothiazide (MICROZIDE) 12.5 MG capsule Take 1 capsule (12.5 mg total) by mouth daily.  90 capsule  3  . levothyroxine (SYNTHROID, LEVOTHROID) 125 MCG tablet Take 125 mcg by mouth daily.      Marland Kitchen LOTEMAX 0.5 % ophthalmic suspension 1 drop daily.      Marland Kitchen lubiprostone (AMITIZA) 24 MCG capsule Take 24 mcg by mouth as needed.      . Multiple Vitamin (MULTIVITAMIN) capsule Take 1 capsule by mouth daily.      . Omega-3 Fatty Acids (FISH OIL PO) Take 1 tablet by mouth daily.      . polyethylene glycol (MIRALAX / GLYCOLAX) packet Take 17 g by mouth as needed. Constipation      . traZODone (DESYREL) 50 MG tablet Take 50 mg by mouth daily.      . metoprolol succinate (TOPROL-XL) 25 MG 24 hr tablet Take 1 tablet (25 mg total) by mouth daily.  30 tablet  3   No current facility-administered medications for this visit.    Allergies  Allergen Reactions  . Iodinated Diagnostic Agents Hives    Per patient she has developed hives and itching in upper trunk of body after last  2 CT scans once she got homes that she has just treated at home with benadryl. She has not told anyone until now. States she was able to bear it   . Hydrocodone-Acetaminophen     Vicodin=REACTION: rash  . Sulfonamide Derivatives     REACTION: rash    Patient Active Problem List   Diagnosis Date Noted  . Personal history of colonic adenoma 02/12/2013  . Abdominal pain, left upper quadrant 01/23/2013  . Chest pain 12/05/2012  . Benign hypertensive heart disease without heart failure 12/05/2012  . Pure hypercholesterolemia 12/05/2012  . Umbilical hernia 09/11/2012  . Chronic  upper abdominal pain 06/24/2012  . HEMORRHOIDS 07/22/2008    History  Smoking status  . Former Smoker  . Types: Cigarettes  . Quit date: 01/23/1993  Smokeless tobacco  . Never Used     History  Alcohol Use  . 1.2 oz/week  . 2 Glasses of wine per week    Family History  Problem Relation Age of Onset  . Esophageal cancer Father   . Stomach cancer Father     mets from esophagus  . Heart disease Mother   . Irritable bowel syndrome Mother   . Kidney disease Mother   . Colon cancer Neg Hx   . Other Mother     cushings disease  . Addison's disease Mother     Review of Systems: Constitutional: no fever chills diaphoresis or fatigue or change in weight.  Head and neck: no hearing loss, no epistaxis, no photophobia or visual disturbance. Respiratory: No cough, shortness of breath or wheezing. Cardiovascular: No chest pain peripheral edema, palpitations. Gastrointestinal: No abdominal distention, no abdominal pain, no change in bowel habits hematochezia or melena. Genitourinary: No dysuria, no frequency, no urgency, no nocturia. Musculoskeletal:No arthralgias, no back pain, no gait disturbance or myalgias. Neurological: No dizziness, no headaches, no numbness, no seizures, no syncope, no weakness, no tremors. Hematologic: No lymphadenopathy, no easy bruising. Psychiatric: No confusion, no hallucinations, no sleep disturbance.    Physical Exam: Filed Vitals:   05/28/13 1453  BP: 138/82  Pulse: 59   the general appearance reveals a well-developed well-nourished anxious woman in no distress.  Her pharmacist husband is with her today.The head and neck exam reveals pupils equal and reactive.  Extraocular movements are full.  There is no scleral icterus.  The mouth and pharynx are normal.  The neck is supple.  The carotids reveal no bruits.  The jugular venous pressure is normal.  The  thyroid is not enlarged.  There is no lymphadenopathy.  The chest is clear to percussion and auscultation.  There are no rales or rhonchi.  Expansion of the chest is symmetrical.  The precordium is quiet.  The first heart sound is normal.  The second heart sound is physiologically split.   There is no murmur gallop rub or click.  There is no abnormal lift or heave.  The abdomen is soft and nontender.  The bowel sounds are normal.  The liver and spleen are not enlarged.  There are no abdominal masses.  There are no abdominal bruits.  Extremities reveal good pedal pulses.  There is no phlebitis or edema.  There is no cyanosis or clubbing.  Strength is normal and symmetrical in all extremities.  There is no lateralizing weakness.  There are no sensory deficits.  The skin is warm and dry.  There is no rash.  EKG today shows sinus bradycardia at 59 per minute otherwise normal EKG  Assessment / Plan: Her propranolol may be contributing to her malaise and fatigue.  We will stop it and switched to Toprol XL and an initial dose of 25 mg daily.  Recheck in 3 months for followup office visit

## 2013-05-28 NOTE — Assessment & Plan Note (Signed)
The patient has not had any recurrence of her chest pain or palpitations

## 2013-05-28 NOTE — Patient Instructions (Signed)
Your physician has recommended you make the following change in your medication:  1) Stop inderal (propranolol). 2) Start toprol (metoprolol succinate) 25 mg one tablet by mouth daily.  Your physician wants you to follow-up in: 3 months with Dr. Patty Sermons. You will receive a reminder letter in the mail two months in advance. If you don't receive a letter, please call our office to schedule the follow-up appointment.

## 2013-06-05 ENCOUNTER — Encounter: Payer: Self-pay | Admitting: Internal Medicine

## 2013-06-05 NOTE — Telephone Encounter (Signed)
Error

## 2013-06-06 ENCOUNTER — Other Ambulatory Visit: Payer: Self-pay

## 2013-07-05 ENCOUNTER — Other Ambulatory Visit: Payer: Self-pay | Admitting: Dermatology

## 2013-07-12 ENCOUNTER — Encounter: Payer: Self-pay | Admitting: Cardiology

## 2013-07-13 ENCOUNTER — Other Ambulatory Visit: Payer: Self-pay

## 2013-07-13 MED ORDER — BENAZEPRIL HCL 20 MG PO TABS: 20.0000 mg | ORAL_TABLET | ORAL | Status: DC

## 2013-07-16 ENCOUNTER — Encounter: Payer: Self-pay | Admitting: Cardiology

## 2013-07-17 MED ORDER — BENAZEPRIL HCL 20 MG PO TABS: 20.0000 mg | ORAL_TABLET | ORAL | Status: DC

## 2013-07-17 NOTE — Telephone Encounter (Signed)
Patient request 90 day supply of lotensin

## 2013-08-28 ENCOUNTER — Encounter: Payer: Self-pay | Admitting: Cardiology

## 2013-08-28 ENCOUNTER — Ambulatory Visit (INDEPENDENT_AMBULATORY_CARE_PROVIDER_SITE_OTHER): Payer: Medicare Other | Admitting: Cardiology

## 2013-08-28 VITALS — BP 138/80 | HR 58 | Ht 64.5 in | Wt 154.8 lb

## 2013-08-28 DIAGNOSIS — R079 Chest pain, unspecified: Secondary | ICD-10-CM

## 2013-08-28 DIAGNOSIS — I119 Hypertensive heart disease without heart failure: Secondary | ICD-10-CM

## 2013-08-28 DIAGNOSIS — R1012 Left upper quadrant pain: Secondary | ICD-10-CM

## 2013-08-28 DIAGNOSIS — G8929 Other chronic pain: Secondary | ICD-10-CM

## 2013-08-28 DIAGNOSIS — R1011 Right upper quadrant pain: Secondary | ICD-10-CM

## 2013-08-28 LAB — BASIC METABOLIC PANEL
BUN: 12 mg/dL (ref 6–23)
CO2: 27 mEq/L (ref 19–32)
Calcium: 9.4 mg/dL (ref 8.4–10.5)
Chloride: 101 mEq/L (ref 96–112)
Creatinine, Ser: 0.8 mg/dL (ref 0.4–1.2)
GFR: 75.03 mL/min (ref >60.00)
Glucose, Bld: 89 mg/dL (ref 70–99)

## 2013-08-28 MED ORDER — BENAZEPRIL HCL 20 MG PO TABS: ORAL_TABLET | ORAL | Status: DC

## 2013-08-28 MED ORDER — METOPROLOL SUCCINATE ER 25 MG PO TB24: ORAL_TABLET | ORAL | Status: DC

## 2013-08-28 NOTE — Assessment & Plan Note (Signed)
The patient has not been experiencing any severe or prolonged chest discomfort

## 2013-08-28 NOTE — Assessment & Plan Note (Signed)
Her chronic upper left abdominal discomfort is still present but appears to be less severe.  She is making dietary changes to try to improve tendency toward constipation

## 2013-08-28 NOTE — Patient Instructions (Signed)
Will obtain labs today and call you with the results (BMET)  Continue Benazepril  20 MG 1/2 in the morning and 1 in the evening as you have already changed  INCREASE YOUR TOPROL (METOPROLOL) TO 25 MG IN THE MORNING AND 1/2 TABLET IN THE EVENING  Your physician recommends that you schedule a follow-up appointment in: 2 MONTH OV/EKG

## 2013-08-28 NOTE — Assessment & Plan Note (Signed)
The patient complains that her blood pressure is still quite labile.  Particularly in the afternoons it tends to go up.  Her blood pressure had been running in the 170 range in the afternoons.  She on her own increased her benazepril from 20 mg to 30 mg a day in divided doses and has noted some improvement.  Her blood pressure is better in the morning when she first wakes up.  Her overall fatigue seems to have improved since we switched her from Inderal to Toprol

## 2013-08-28 NOTE — Progress Notes (Signed)
Debbie Wells Date of Birth:  1941/08/29 [address] [place], [place]  [address] [phone]         Fax   [phone]  History of Present Illness: This pleasant 72 year old married Caucasian female is seen for a 3 month followup office visit.  We had initially seen her at the request of Dr. Evlyn Kanner for evaluation of palpitations and poorly controlled high blood pressure she had previously worn an outpatient 24-hour blood pressure monitor which showed wide swings of blood pressure she has had a 24-hour urine collection for metanephrines which was normal. She has had blood work in Dr. Enid Derry office which has ruled out Cushing's syndrome her cardiac workup as included a Lexa scan Myoview stress test on 12/07/12 showing no evidence of ischemia and her ejection fraction was 60% and there were no wall motion abnormalities.  She had a previous echocardiogram 09/17/11 showing normal LV function and mild mitral regurgitation she has had problems with chronic steady pain in the left epigastrium and left upper quadrant and has seen her gastroenterologist Dr. Leone Payor.  She has seen Dr. Jerilee Field regarding question of mesenteric and or celiac artery stenosis as a possible cause of her left upper quadrant pain.  Dr. Hart Rochester did not feel that the quality of her pain suggested mesenteric ischemia.  Since we last saw the patient she has been seen by vascular surgery at [facility].  They did not feel that she would benefit from surgery.  They did recommend some dietary changes which she has been trying including high fiber diet and treatment for constipation.    Current Outpatient Prescriptions  Medication Sig Dispense Refill  . ALPRAZolam (XANAX) 0.5 MG tablet Take 0.25-0.5 mg by mouth 2 (two) times daily. Pt takes a full tab at night and half a tab during day      . aspirin 81 MG tablet Take 81 mg by mouth daily.       Marland Kitchen atorvastatin (LIPITOR) 10 MG tablet Take 10 mg by mouth daily.        . benazepril (LOTENSIN) 20 MG tablet 1/2 TABLET IN THE MORNING AND 1 TABLET IN THE EVENING  135 tablet  3  . Calcium Carbonate-Vitamin D (CALCIUM-D) 600-400 MG-UNIT TABS Take 1 tablet by mouth daily.      . DULoxetine (CYMBALTA) 60 MG capsule Take 60 mg by mouth daily.      Marland Kitchen estrogens, conjugated, (PREMARIN) 0.45 MG tablet Take 0.45 mg by mouth daily.       . hydrochlorothiazide (MICROZIDE) 12.5 MG capsule Take 1 capsule (12.5 mg total) by mouth daily.  90 capsule  3  . levothyroxine (SYNTHROID, LEVOTHROID) 125 MCG tablet Take 125 mcg by mouth daily.      Marland Kitchen LOTEMAX 0.5 % ophthalmic suspension 1 drop daily.      Marland Kitchen lubiprostone (AMITIZA) 24 MCG capsule Take 24 mcg by mouth as needed.      . metoprolol succinate (TOPROL-XL) 25 MG 24 hr tablet 1 TABLET IN THE MORNING AND 1/2 TABLET IN THE EVENING  145 tablet  3  . Multiple Vitamin (MULTIVITAMIN) capsule Take 1 capsule by mouth daily.      . Omega-3 Fatty Acids (FISH OIL PO) Take 1 tablet by mouth daily.      . polyethylene glycol (MIRALAX / GLYCOLAX) packet Take 17 g by mouth as needed. Constipation      . traZODone (DESYREL) 50 MG tablet Take 50 mg by mouth daily.  No current facility-administered medications for this visit.    Allergies  Allergen Reactions  . Iodinated Diagnostic Agents Hives    Per patient she has developed hives and itching in upper trunk of body after last 2 CT scans once she got homes that she has just treated at home with benadryl. She has not told anyone until now. States she was able to bear it   . Hydrocodone-Acetaminophen     Vicodin=REACTION: rash  . Sulfonamide Derivatives     REACTION: rash    Patient Active Problem List   Diagnosis Date Noted  . Personal history of colonic adenoma 02/12/2013  . Abdominal pain, left upper quadrant 01/23/2013  . Chest pain 12/05/2012  . Benign hypertensive heart disease without heart failure 12/05/2012  . Pure hypercholesterolemia 12/05/2012  . Umbilical hernia  09/11/2012  . Chronic  upper abdominal pain 06/24/2012  . HEMORRHOIDS 07/22/2008    History  Smoking status  . Former Smoker  . Types: Cigarettes  . Quit date: 01/23/1993  Smokeless tobacco  . Never Used    History  Alcohol Use  . 1.2 oz/week  . 2 Glasses of wine per week    Family History  Problem Relation Age of Onset  . Esophageal cancer Father   . Stomach cancer Father     mets from esophagus  . Heart disease Mother   . Irritable bowel syndrome Mother   . Kidney disease Mother   . Colon cancer Neg Hx   . Other Mother     cushings disease  . Addison's disease Mother     Review of Systems: Constitutional: no fever chills diaphoresis or fatigue or change in weight.  Head and neck: no hearing loss, no epistaxis, no photophobia or visual disturbance. Respiratory: No cough, shortness of breath or wheezing. Cardiovascular: No chest pain peripheral edema, palpitations. Gastrointestinal: No abdominal distention, no abdominal pain, no change in bowel habits hematochezia or melena. Genitourinary: No dysuria, no frequency, no urgency, no nocturia. Musculoskeletal:No arthralgias, no back pain, no gait disturbance or myalgias. Neurological: No dizziness, no headaches, no numbness, no seizures, no syncope, no weakness, no tremors. Hematologic: No lymphadenopathy, no easy bruising. Psychiatric: No confusion, no hallucinations, no sleep disturbance.    Physical Exam: Filed Vitals:   08/28/13 1019  BP: 138/80  Pulse: 58   the general appearance reveals a well-developed well-nourished anxious woman in no distress.  Her pharmacist husband is with her today.The head and neck exam reveals pupils equal and reactive.  Extraocular movements are full.  There is no scleral icterus.  The mouth and pharynx are normal.  The neck is supple.  The carotids reveal no bruits.  The jugular venous pressure is normal.  The  thyroid is not enlarged.  There is no lymphadenopathy.  The chest is clear  to percussion and auscultation.  There are no rales or rhonchi.  Expansion of the chest is symmetrical.  The precordium is quiet.  The first heart sound is normal.  The second heart sound is physiologically split.  There is no murmur gallop rub or click.  There is no abnormal lift or heave.  The abdomen is soft and nontender.  The bowel sounds are normal.  The liver and spleen are not enlarged.  There are no abdominal masses.  There are no abdominal bruits.  Extremities reveal good pedal pulses.  There is no phlebitis or edema.  There is no cyanosis or clubbing.  Strength is normal and symmetrical in all extremities.  There is no lateralizing weakness.  There are no sensory deficits.  The skin is warm and dry.  There is no rash.     Assessment / Plan: We will continue her current dose of benazepril which is 10 mg in the morning and 20 mg in the evening. We will increase her Toprol XL 25 by continuing 25 mg in the morning and adding 12.5 mg in the evening.  We are going to check a basal metabolic panel today. She'll be rechecked in 2 months for office visit and EKG.

## 2013-08-29 NOTE — Progress Notes (Signed)
Quick Note:  Please report to patient. The recent labs are stable. Continue same medication and careful diet. ______ 

## 2013-09-06 ENCOUNTER — Other Ambulatory Visit: Payer: Self-pay

## 2013-12-20 ENCOUNTER — Ambulatory Visit (INDEPENDENT_AMBULATORY_CARE_PROVIDER_SITE_OTHER): Payer: Medicare Other | Admitting: Cardiology

## 2013-12-20 ENCOUNTER — Encounter: Payer: Self-pay | Admitting: Cardiology

## 2013-12-20 VITALS — BP 139/62 | HR 55 | Ht 64.0 in | Wt 156.0 lb

## 2013-12-20 DIAGNOSIS — I119 Hypertensive heart disease without heart failure: Secondary | ICD-10-CM

## 2013-12-20 DIAGNOSIS — R1012 Left upper quadrant pain: Secondary | ICD-10-CM

## 2013-12-20 DIAGNOSIS — R079 Chest pain, unspecified: Secondary | ICD-10-CM

## 2013-12-20 DIAGNOSIS — E7800 Pure hypercholesterolemia, unspecified: Secondary | ICD-10-CM

## 2013-12-20 NOTE — Progress Notes (Signed)
Debbie Wells Date of Birth:  1941-03-24 [address] [place],   [address] [phone]         Fax   [phone]  History of Present Illness: This pleasant 73 year old married Caucasian female is seen for a 3 month followup office visit.  We had initially seen her at the request of Dr. Forde Dandy for evaluation of palpitations and poorly controlled high blood pressure she had previously worn an outpatient 24-hour blood pressure monitor which showed wide swings of blood pressure she has had a 24-hour urine collection for metanephrines which was normal. She has had blood work in Dr. Ella Jubilee office which has ruled out Cushing's syndrome her cardiac workup as included a Lexa scan Myoview stress test on 12/07/12 showing no evidence of ischemia and her ejection fraction was 60% and there were no wall motion abnormalities.  She had a previous echocardiogram 09/17/11 showing normal LV function and mild mitral regurgitation she has had problems with chronic steady pain in the left epigastrium and left upper quadrant and has seen her gastroenterologist Dr. Carlean Purl.  She has seen Dr. Mamie Nick regarding question of mesenteric and or celiac artery stenosis as a possible cause of her left upper quadrant pain.  Dr. Kellie Simmering did not feel that the quality of her pain suggested mesenteric ischemia.  Since we last saw the patient she has been seen by vascular surgery at [facility].  They did not feel that she would benefit from surgery.  They did recommend some dietary changes which she has been trying including high fiber diet and treatment for constipation. Overall since last visit she reports that her blood pressure has been less erratic.    Current Outpatient Prescriptions  Medication Sig Dispense Refill  . ALPRAZolam (XANAX) 0.5 MG tablet Take 0.25-0.5 mg by mouth 2 (two) times daily. Pt takes a full tab at night and half a tab during day      . aspirin 81 MG tablet Take 81 mg by  mouth daily.       Marland Kitchen atorvastatin (LIPITOR) 10 MG tablet Take 10 mg by mouth daily.      . benazepril (LOTENSIN) 20 MG tablet 1 TABLET IN THE MORNING AND 1/2 TABLET IN THE EVENING      . Calcium Carbonate-Vitamin D (CALCIUM-D) 600-400 MG-UNIT TABS Take 1 tablet by mouth daily.      . DULoxetine (CYMBALTA) 60 MG capsule Take 60 mg by mouth daily.      Marland Kitchen estrogens, conjugated, (PREMARIN) 0.45 MG tablet Take 0.45 mg by mouth daily.       . hydrochlorothiazide (MICROZIDE) 12.5 MG capsule Take 1 capsule (12.5 mg total) by mouth daily.  90 capsule  3  . levothyroxine (SYNTHROID, LEVOTHROID) 125 MCG tablet Take 125 mcg by mouth daily.      Marland Kitchen LOTEMAX 0.5 % ophthalmic suspension 1 drop daily.      Marland Kitchen lubiprostone (AMITIZA) 24 MCG capsule Take 24 mcg by mouth as needed.      . metoprolol succinate (TOPROL-XL) 25 MG 24 hr tablet 1/2 TABLET IN THE MORNING AND 1 TABLET IN THE EVENING      . Multiple Vitamin (MULTIVITAMIN) capsule Take 1 capsule by mouth daily.      . Omega-3 Fatty Acids (FISH OIL PO) Take 1 tablet by mouth daily.      . polyethylene glycol (MIRALAX / GLYCOLAX) packet Take 17 g by mouth as needed. Constipation      . traZODone (  DESYREL) 50 MG tablet Take 50 mg by mouth daily.       No current facility-administered medications for this visit.    Allergies  Allergen Reactions  . Iodinated Diagnostic Agents Hives    Per patient she has developed hives and itching in upper trunk of body after last 2 CT scans once she got homes that she has just treated at home with benadryl. She has not told anyone until now. States she was able to bear it   . Hydrocodone-Acetaminophen     Vicodin=REACTION: rash  . Sulfonamide Derivatives     REACTION: rash    Patient Active Problem List   Diagnosis Date Noted  . Personal history of colonic adenoma 02/12/2013  . Abdominal pain, left upper quadrant 01/23/2013  . Chest pain 12/05/2012  . Benign hypertensive heart disease without heart failure 12/05/2012   . Pure hypercholesterolemia 12/05/2012  . Umbilical hernia [id number]  . Chronic  upper abdominal pain 06/24/2012  . HEMORRHOIDS 07/22/2008    History  Smoking status  . Former Smoker  . Types: Cigarettes  . Quit date: 01/23/1993  Smokeless tobacco  . Never Used    History  Alcohol Use  . 1.2 oz/week  . 2 Glasses of wine per week    Family History  Problem Relation Age of Onset  . Esophageal cancer Father   . Stomach cancer Father     mets from esophagus  . Heart disease Mother   . Irritable bowel syndrome Mother   . Kidney disease Mother   . Colon cancer Neg Hx   . Other Mother     cushings disease  . Addison's disease Mother     Review of Systems: Constitutional: no fever chills diaphoresis or fatigue or change in weight.  Head and neck: no hearing loss, no epistaxis, no photophobia or visual disturbance. Respiratory: No cough, shortness of breath or wheezing. Cardiovascular: No chest pain peripheral edema, palpitations. Gastrointestinal: No abdominal distention, no abdominal pain, no change in bowel habits hematochezia or melena. Genitourinary: No dysuria, no frequency, no urgency, no nocturia. Musculoskeletal:No arthralgias, no back pain, no gait disturbance or myalgias. Neurological: No dizziness, no headaches, no numbness, no seizures, no syncope, no weakness, no tremors. Hematologic: No lymphadenopathy, no easy bruising. Psychiatric: No confusion, no hallucinations, no sleep disturbance.    Physical Exam: Filed Vitals:   12/20/13 1427  BP: 139/62  Pulse: 55   the general appearance reveals a well-developed well-nourished anxious woman in no distress.  Her pharmacist husband is with her today.The head and neck exam reveals pupils equal and reactive.  Extraocular movements are full.  There is no scleral icterus.  The mouth and pharynx are normal.  The neck is supple.  The carotids reveal no bruits.  The jugular venous pressure is normal.  The  thyroid is  not enlarged.  There is no lymphadenopathy.  The chest is clear to percussion and auscultation.  There are no rales or rhonchi.  Expansion of the chest is symmetrical.  The precordium is quiet.  The first heart sound is normal.  The second heart sound is physiologically split.  There is no murmur gallop rub or click.  There is no abnormal lift or heave.  The abdomen is soft and nontender.  The bowel sounds are normal.  The liver and spleen are not enlarged.  There are no abdominal masses.  There are no abdominal bruits.  Extremities reveal good pedal pulses.  There is no phlebitis or edema.  There is no cyanosis or clubbing.  Strength is normal and symmetrical in all extremities.  There is no lateralizing weakness.  There are no sensory deficits.  The skin is warm and dry.  There is no rash.    EKG today showed sinus bradycardia at 55 per minute with a vertical axis.  There are no ischemic changes.  Since last tracing 05/28/13, no significant change.  Assessment / Plan: Continue same medication overall except change timing of Toprol so that she takes 25 mg in the morning and 50 mg in the evening.  Change timing of benazepril to a full 20 mg in the morning and 10 mg in the evening. Recheck in 3 months for followup office visit

## 2013-12-20 NOTE — Assessment & Plan Note (Signed)
Left upper quadrant abdominal pain has been overall less severe in the past several months.  She is still having difficulty with constipation and has been using a variety of laxatives and has a bowel movement about every other day

## 2013-12-20 NOTE — Patient Instructions (Signed)
CHANGE TOPROL (METOPROLOL) TO 1/ 2 IN THE MORNING AND 1 IN THE EVENING  CHANGE BENAZEPRIL TO 1 IN THE MORNING AND 1/2 IN THE EVENING   Your physician recommends that you schedule a follow-up appointment in: 3 MONTHS

## 2013-12-20 NOTE — Assessment & Plan Note (Signed)
The patient monitors her blood pressure at home closely.  Her blood pressure has been improved on present dose of meds.  She does complain that the full tablet of Toprol in the morning causes her to have less energy.  However she understands that this is a trade-off for feeling better overall with better blood pressure control.  We will try to back loaded her dose of Toprol by giving the full tablet in the evening and switching a half tablets in the morning to see if this makes a difference in energy.  As we do that we will change her benazepril to a full tablet in the morning with a half tablet in the evening. Of note is the fact that she feels that her abdominal pain which she attributes to her celiac bruits has improved with better blood pressure control.

## 2013-12-20 NOTE — Assessment & Plan Note (Signed)
The patient has not been experiencing any recent chest pain or angina 

## 2013-12-21 ENCOUNTER — Ambulatory Visit: Payer: Medicare Other | Admitting: Cardiology

## 2014-01-10 ENCOUNTER — Other Ambulatory Visit: Payer: Self-pay | Admitting: Cardiology

## 2014-03-12 ENCOUNTER — Encounter: Payer: Self-pay | Admitting: Cardiology

## 2014-03-12 ENCOUNTER — Ambulatory Visit (INDEPENDENT_AMBULATORY_CARE_PROVIDER_SITE_OTHER): Payer: Medicare Other | Admitting: Cardiology

## 2014-03-12 VITALS — BP 144/66 | HR 60 | Ht 64.0 in | Wt 157.0 lb

## 2014-03-12 DIAGNOSIS — I119 Hypertensive heart disease without heart failure: Secondary | ICD-10-CM

## 2014-03-12 DIAGNOSIS — R1011 Right upper quadrant pain: Secondary | ICD-10-CM

## 2014-03-12 DIAGNOSIS — R1012 Left upper quadrant pain: Secondary | ICD-10-CM

## 2014-03-12 DIAGNOSIS — K589 Irritable bowel syndrome without diarrhea: Secondary | ICD-10-CM

## 2014-03-12 DIAGNOSIS — R079 Chest pain, unspecified: Secondary | ICD-10-CM

## 2014-03-12 DIAGNOSIS — G8929 Other chronic pain: Secondary | ICD-10-CM

## 2014-03-12 NOTE — Patient Instructions (Signed)
Your physician recommends that you continue on your current medications as directed. Please refer to the Current Medication list given to you today.  Your physician recommends that you schedule a follow-up appointment in: 4 MONTH OV 

## 2014-03-12 NOTE — Assessment & Plan Note (Signed)
At home her blood pressures have been reasonably satisfactory.  She will get an occasional spike.  She brought in a long list of her home blood pressure readings today.

## 2014-03-12 NOTE — Assessment & Plan Note (Signed)
Her constipation and her abdominal pain had improved slightly since her gastroenterologist started her on linzess.

## 2014-03-12 NOTE — Progress Notes (Signed)
Debbie Wells Date of Birth:  1941-09-14 [place] [address] [place], [place]  [address] [phone]        Fax   [phone]   History of Present Illness:  This pleasant 73 year old married Caucasian female is seen for a 3 month followup office visit. We had initially seen her at the request of Dr. Forde Dandy for evaluation of palpitations and poorly controlled high blood pressure she had previously worn an outpatient 24-hour blood pressure monitor which showed wide swings of blood pressure she has had a 24-hour urine collection for metanephrines which was normal. She has had blood work in Dr. Ella Jubilee office which has ruled out Cushing's syndrome her cardiac workup as included a Lexa scan Myoview stress test on 12/07/12 showing no evidence of ischemia and her ejection fraction was 60% and there were no wall motion abnormalities. She had a previous echocardiogram 09/17/11 showing normal LV function and mild mitral regurgitation she has had problems with chronic steady pain in the left epigastrium and left upper quadrant and has seen her gastroenterologist Dr. Carlean Purl. She has seen Dr. Mamie Nick regarding question of mesenteric and or celiac artery stenosis as a possible cause of her left upper quadrant pain. Dr. Kellie Simmering did not feel that the quality of her pain suggested mesenteric ischemia.  She has also been seen by vascular surgery at [facility]. They did not feel that she would benefit from surgery. They did recommend some dietary changes which she has been trying including high fiber diet and treatment for constipation.  She is now taking Linzess for her constipation associated with irritable bowel syndrome  Overall since last visit she reports that her blood pressure has been less erratic.   Current Outpatient Prescriptions  Medication Sig Dispense Refill  . ALPRAZolam (XANAX) 0.5 MG tablet Take 0.25-0.5 mg by mouth 2 (two) times daily. Pt takes a full tab at  night and half a tab during day      . aspirin 81 MG tablet Take 81 mg by mouth daily.       Marland Kitchen atorvastatin (LIPITOR) 10 MG tablet Take 10 mg by mouth daily.      . benazepril (LOTENSIN) 20 MG tablet 1/2 TABLET IN THE MORNING AND 1 TABLET IN THE EVENING      . Calcium Carbonate-Vitamin D (CALCIUM-D) 600-400 MG-UNIT TABS Take 1 tablet by mouth daily.      . DULoxetine (CYMBALTA) 60 MG capsule Take 60 mg by mouth daily.      Marland Kitchen estrogens, conjugated, (PREMARIN) 0.45 MG tablet Take 0.45 mg by mouth daily.       . hydrochlorothiazide (MICROZIDE) 12.5 MG capsule TAKE ONE CAPSULE EVERY DAY  90 capsule  0  . levothyroxine (SYNTHROID, LEVOTHROID) 125 MCG tablet Take 125 mcg by mouth daily.      . Linaclotide (LINZESS) 145 MCG CAPS capsule Take 145 mcg by mouth daily.      Marland Kitchen LOTEMAX 0.5 % ophthalmic suspension 1 drop daily.      . metoprolol succinate (TOPROL-XL) 25 MG 24 hr tablet 1TABLET IN THE MORNING AND 1/2  TABLET IN THE EVENING      . Multiple Vitamin (MULTIVITAMIN) capsule Take 1 capsule by mouth daily.      . Omega-3 Fatty Acids (FISH OIL PO) Take 1 tablet by mouth daily.      . polyethylene glycol (MIRALAX / GLYCOLAX) packet Take 17 g by mouth as needed. Constipation      .  traZODone (DESYREL) 50 MG tablet Take 50 mg by mouth daily.       No current facility-administered medications for this visit.    Allergies  Allergen Reactions  . Iodinated Diagnostic Agents Hives    Per patient she has developed hives and itching in upper trunk of body after last 2 CT scans once she got homes that she has just treated at home with benadryl. She has not told anyone until now. States she was able to bear it   . Hydrocodone-Acetaminophen     Vicodin=REACTION: rash  . Sulfonamide Derivatives     REACTION: rash    Patient Active Problem List   Diagnosis Date Noted  . IBS (irritable bowel syndrome) 03/12/2014  . Personal history of colonic adenoma 02/12/2013  . Abdominal pain, left upper quadrant  01/23/2013  . Chest pain 12/05/2012  . Benign hypertensive heart disease without heart failure 12/05/2012  . Pure hypercholesterolemia 12/05/2012  . Umbilical hernia 38/08/1750  . Chronic  upper abdominal pain 06/24/2012  . HEMORRHOIDS 07/22/2008    History  Smoking status  . Former Smoker  . Types: Cigarettes  . Quit date: 01/23/1993  Smokeless tobacco  . Never Used    History  Alcohol Use  . 1.2 oz/week  . 2 Glasses of wine per week    Family History  Problem Relation Age of Onset  . Esophageal cancer Father   . Stomach cancer Father     mets from esophagus  . Heart disease Mother   . Irritable bowel syndrome Mother   . Kidney disease Mother   . Colon cancer Neg Hx   . Other Mother     cushings disease  . Addison's disease Mother     Review of Systems: Constitutional: no fever chills diaphoresis or fatigue or change in weight.  Head and neck: no hearing loss, no epistaxis, no photophobia or visual disturbance. Respiratory: No cough, shortness of breath or wheezing. Cardiovascular: No chest pain peripheral edema, palpitations. Gastrointestinal: No abdominal distention, no abdominal pain, no change in bowel habits hematochezia or melena. Genitourinary: No dysuria, no frequency, no urgency, no nocturia. Musculoskeletal:No arthralgias, no back pain, no gait disturbance or myalgias. Neurological: No dizziness, no headaches, no numbness, no seizures, no syncope, no weakness, no tremors. Hematologic: No lymphadenopathy, no easy bruising. Psychiatric: No confusion, no hallucinations, no sleep disturbance.    Physical Exam: Filed Vitals:   03/12/14 1006  BP: 144/66  Pulse: 60   the general appearance reveals a well-developed well-nourished woman in no distress.The head and neck exam reveals pupils equal and reactive.  Extraocular movements are full.  There is no scleral icterus.  The mouth and pharynx are normal.  The neck is supple.  The carotids reveal no bruits.   The jugular venous pressure is normal.  The  thyroid is not enlarged.  There is no lymphadenopathy.  The chest is clear to percussion and auscultation.  There are no rales or rhonchi.  Expansion of the chest is symmetrical.  The precordium is quiet.  The first heart sound is normal.  The second heart sound is physiologically split.  There is no murmur gallop rub or click.  There is no abnormal lift or heave.  The abdomen is soft and nontender.  The bowel sounds are normal.  The liver and spleen are not enlarged.  There are no abdominal masses.  There are no abdominal bruits.  Extremities reveal good pedal pulses.  There is no phlebitis or edema.  There  is no cyanosis or clubbing.  Strength is normal and symmetrical in all extremities.  There is no lateralizing weakness.  There are no sensory deficits.  The skin is warm and dry.  There is no rash.     Assessment / Plan: 1. labile hypertension 2. left upper quadrant abdominal pain 3. Hypercholesterolemia 4. chest pain resolved  Plan: Continue current medications.  If her blood pressure spikes she may take extra half tablet of metoprolol. Recheck in 4 months for office visit.

## 2014-03-12 NOTE — Assessment & Plan Note (Signed)
She has not had any further chest pain.  She has been going to a gym 3 times a week and walking for exercise.  Her weight is up 1 pound since last visit

## 2014-05-11 ENCOUNTER — Other Ambulatory Visit: Payer: Self-pay | Admitting: Cardiology

## 2014-05-13 ENCOUNTER — Other Ambulatory Visit: Payer: Self-pay | Admitting: Cardiology

## 2014-07-18 ENCOUNTER — Ambulatory Visit (INDEPENDENT_AMBULATORY_CARE_PROVIDER_SITE_OTHER): Payer: Medicare Other | Admitting: Cardiology

## 2014-07-18 VITALS — BP 108/60 | HR 58 | Ht 64.0 in | Wt 155.0 lb

## 2014-07-18 DIAGNOSIS — R1012 Left upper quadrant pain: Secondary | ICD-10-CM

## 2014-07-18 DIAGNOSIS — I119 Hypertensive heart disease without heart failure: Secondary | ICD-10-CM

## 2014-07-18 DIAGNOSIS — R079 Chest pain, unspecified: Secondary | ICD-10-CM

## 2014-07-18 DIAGNOSIS — G8929 Other chronic pain: Secondary | ICD-10-CM

## 2014-07-18 DIAGNOSIS — R1011 Right upper quadrant pain: Secondary | ICD-10-CM

## 2014-07-18 NOTE — Assessment & Plan Note (Signed)
Her blood pressure better.  In the mornings her blood pressure is low.  In the evening it is higher but usually not above 130.  She states that her heart rate is rarely above 55.

## 2014-07-18 NOTE — Assessment & Plan Note (Signed)
The patient has not had any recurrent chest pains.

## 2014-07-18 NOTE — Assessment & Plan Note (Signed)
Her chronic left upper quadrant abdominal pain has improved since last visit.  She continues on Linzess

## 2014-07-18 NOTE — Progress Notes (Signed)
Debbie Wells Date of Birth:  April 17, 1941 [place] [address] [place], [place]  [address] [phone]        Fax   [phone]   History of Present Illness:  This pleasant 73 year old married Caucasian female is seen for a 4 month followup office visit. We had initially seen her at the request of Dr. Forde Dandy for evaluation of palpitations and poorly controlled high blood pressure.   She had previously worn an outpatient 24-hour blood pressure monitor which showed wide swings of blood pressure.   She has had a 24-hour urine collection for metanephrines which was normal. She has had blood work in Dr. Forde Dandy office which has ruled out Cushing's syndrome.   Her cardiac workup as included a Lexi scan Myoview stress test on 12/07/12 showing no evidence of ischemia and her ejection fraction was 60% and there were no wall motion abnormalities. She had a previous echocardiogram 09/17/11 showing normal LV function and mild mitral regurgitation.   She has had problems with chronic steady pain in the left epigastrium and left upper quadrant and has seen her gastroenterologist Dr. Carlean Purl. She has seen Dr. Mamie Nick regarding question of mesenteric and or celiac artery stenosis as a possible cause of her left upper quadrant pain. Dr. Kellie Simmering did not feel that the quality of her pain suggested mesenteric ischemia.  She has also been seen by vascular surgery at [facility]. They did not feel that she would benefit from surgery. They did recommend some dietary changes which she has been trying including high fiber diet and treatment for constipation.  She is now taking Linzess for her constipation associated with irritable bowel syndrome  Overall since last visit she reports that her blood pressure has been less erratic. She complains of lack of energy.  She has to take a nap after lunch each day.   Current Outpatient Prescriptions  Medication Sig Dispense Refill  . ALPRAZolam  (XANAX) 0.5 MG tablet Take 0.25-0.5 mg by mouth 2 (two) times daily. Pt takes a full tab at night and half a tab during day      . aspirin 81 MG tablet Take 81 mg by mouth daily.       Marland Kitchen atorvastatin (LIPITOR) 10 MG tablet Take 10 mg by mouth daily.      . benazepril (LOTENSIN) 20 MG tablet 1/2 TABLET IN THE MORNING AND 1 TABLET IN THE EVENING      . Calcium Carbonate-Vitamin D (CALCIUM-D) 600-400 MG-UNIT TABS Take 1 tablet by mouth daily.      . DULoxetine (CYMBALTA) 20 MG capsule Take 20 mg by mouth 2 (two) times daily.      Marland Kitchen estrogens, conjugated, (PREMARIN) 0.45 MG tablet Take 0.45 mg by mouth daily.       . hydrochlorothiazide (MICROZIDE) 12.5 MG capsule TAKE ONE CAPSULE EVERY OTHER DAY      . levothyroxine (SYNTHROID, LEVOTHROID) 125 MCG tablet Take 125 mcg by mouth daily.      . Linaclotide (LINZESS) 145 MCG CAPS capsule Take 145 mcg by mouth daily.      Marland Kitchen LOTEMAX 0.5 % ophthalmic suspension Place 1 drop into both eyes daily. As needed.      . metoprolol succinate (TOPROL-XL) 25 MG 24 hr tablet 1/2  TABLET BY MOUTH TWICE A DAY      . Multiple Vitamin (MULTIVITAMIN) capsule Take 1 capsule by mouth daily.      . Omega-3 Fatty Acids (FISH OIL  PO) Take 1 tablet by mouth daily.      . traZODone (DESYREL) 50 MG tablet Take 50 mg by mouth daily.       No current facility-administered medications for this visit.    Allergies  Allergen Reactions  . Iodinated Diagnostic Agents Hives    Per patient she has developed hives and itching in upper trunk of body after last 2 CT scans once she got homes that she has just treated at home with benadryl. She has not told anyone until now. States she was able to bear it   . Hydrocodone-Acetaminophen     Vicodin=REACTION: rash  . Sulfonamide Derivatives     REACTION: rash    Patient Active Problem List   Diagnosis Date Noted  . IBS (irritable bowel syndrome) 03/12/2014  . Personal history of colonic adenoma 02/12/2013  . Abdominal pain, left upper  quadrant 01/23/2013  . Chest pain 12/05/2012  . Benign hypertensive heart disease without heart failure 12/05/2012  . Pure hypercholesterolemia 12/05/2012  . Umbilical hernia 73/42/8768  . Chronic  upper abdominal pain 06/24/2012  . HEMORRHOIDS 07/22/2008    History  Smoking status  . Former Smoker  . Types: Cigarettes  . Quit date: 01/23/1993  Smokeless tobacco  . Never Used    History  Alcohol Use  . 1.2 oz/week  . 2 Glasses of wine per week    Family History  Problem Relation Age of Onset  . Esophageal cancer Father   . Stomach cancer Father     mets from esophagus  . Heart disease Mother   . Irritable bowel syndrome Mother   . Kidney disease Mother   . Colon cancer Neg Hx   . Other Mother     cushings disease  . Addison's disease Mother     Review of Systems: Constitutional: no fever chills diaphoresis or fatigue or change in weight.  Head and neck: no hearing loss, no epistaxis, no photophobia or visual disturbance. Respiratory: No cough, shortness of breath or wheezing. Cardiovascular: No chest pain peripheral edema, palpitations. Gastrointestinal: No abdominal distention, no abdominal pain, no change in bowel habits hematochezia or melena. Genitourinary: No dysuria, no frequency, no urgency, no nocturia. Musculoskeletal:No arthralgias, no back pain, no gait disturbance or myalgias. Neurological: No dizziness, no headaches, no numbness, no seizures, no syncope, no weakness, no tremors. Hematologic: No lymphadenopathy, no easy bruising. Psychiatric: No confusion, no hallucinations, no sleep disturbance.    Physical Exam: Filed Vitals:   07/18/14 1103  BP: 108/60  Pulse: 58   the general appearance reveals a well-developed well-nourished woman in no distress.The head and neck exam reveals pupils equal and reactive.  Extraocular movements are full.  There is no scleral icterus.  The mouth and pharynx are normal.  The neck is supple.  The carotids reveal no  bruits.  The jugular venous pressure is normal.  The  thyroid is not enlarged.  There is no lymphadenopathy.  The chest is clear to percussion and auscultation.  There are no rales or rhonchi.  Expansion of the chest is symmetrical.  The precordium is quiet.  The first heart sound is normal.  The second heart sound is physiologically split.  There is no murmur gallop rub or click.  There is no abnormal lift or heave.  The abdomen is soft and nontender.  The bowel sounds are normal.  The liver and spleen are not enlarged.  There are no abdominal masses.  There are no abdominal bruits.  Extremities  reveal good pedal pulses.  There is no phlebitis or edema.  There is no cyanosis or clubbing.  Strength is normal and symmetrical in all extremities.  There is no lateralizing weakness.  There are no sensory deficits.  The skin is warm and dry.  There is no rash.     Assessment / Plan: 1. labile hypertension, improved. 2. left upper quadrant abdominal pain, improved. 3. Hypercholesterolemia 4. chest pain resolved  Plan: She feels that the daily hydrochlorothiazide is drying her out too much.  She does not have any edema.  Blood pressures are low normal.  We will reduce her HCTZ 12.5 mg to just every other day.  Additionally, because of her complaints of fatigue and no energy we will try reducing her Toprol to just 12.5 mg twice a day.  If her blood pressure starts to go up we will increase the Toprol first.  She checks her pressures at home frequently. Recheck in 4 months for office visit.

## 2014-07-18 NOTE — Patient Instructions (Signed)
DECREASE YOUR HYDROCHLOROTHIAZIDE TO 12.5 MG EVERY OTHER DAY  DECREASE YOUR TOPROL (METOPROLOL SUC) 25 MG TO 1/2 TABLET TWICE A DAY  Your physician wants you to follow-up in: [place] will receive a reminder letter in the mail two months in advance. If you don't receive a letter, please call our office to schedule the follow-up appointment.

## 2014-09-03 ENCOUNTER — Other Ambulatory Visit: Payer: Self-pay | Admitting: Cardiology

## 2014-09-06 ENCOUNTER — Other Ambulatory Visit: Payer: Self-pay | Admitting: Cardiology

## 2014-09-06 MED ORDER — BENAZEPRIL HCL 20 MG PO TABS: ORAL_TABLET | ORAL | Status: DC

## 2014-09-13 ENCOUNTER — Telehealth: Payer: Self-pay | Admitting: Cardiology

## 2014-09-13 MED ORDER — BENAZEPRIL HCL 20 MG PO TABS: 20.0000 mg | ORAL_TABLET | Freq: Two times a day (BID) | ORAL | Status: DC

## 2014-09-13 NOTE — Telephone Encounter (Signed)
When called patient back she stated that she had pain in her abdomen when her blood pressure is elevated Per  Dr. Mare Ferrari contact her GI doctor regarding this Advised patient, verbalized understanding New Rx sent to pharmacy

## 2014-09-13 NOTE — Telephone Encounter (Signed)
New message      Patient calling c/o blood pressure is high  For awhile need to discuss medication.    This am  154/67  Last  night 172/67

## 2014-09-13 NOTE — Telephone Encounter (Signed)
Spoke with patient and she stated her blood pressure has been running up all week she thinks Did not check until last night but had not felt well this week Blood pressure last night at 9:00 pm 172/67 HR 53 Today blood pressure 187/80 right arm and 177/87 left arm with different machine (has two) States about 2 weeks after last ov (07/18/14) her blood pressure elevated and she went back to her HCTZ 25 mg every other day and Toprol 25 mg 1 tablet twice a day. Both had been decreased at visit This change helped but has not been checking her blood pressure regularly  Still taking Benazepril 20 mg 1/2 in th morning and 1 in the evening  Will forward to  Dr. Mare Ferrari for review

## 2014-09-13 NOTE — Telephone Encounter (Signed)
Increase benazepril to a whole tablet twice a day

## 2014-09-15 ENCOUNTER — Other Ambulatory Visit: Payer: Self-pay | Admitting: Cardiology

## 2014-09-17 ENCOUNTER — Other Ambulatory Visit: Payer: Self-pay | Admitting: Cardiology

## 2014-09-19 ENCOUNTER — Other Ambulatory Visit: Payer: Self-pay | Admitting: Cardiology

## 2014-09-19 ENCOUNTER — Other Ambulatory Visit: Payer: Self-pay | Admitting: *Deleted

## 2014-09-20 ENCOUNTER — Telehealth: Payer: Self-pay

## 2014-09-20 ENCOUNTER — Encounter: Payer: Self-pay | Admitting: Cardiology

## 2014-09-20 ENCOUNTER — Other Ambulatory Visit: Payer: Self-pay

## 2014-09-20 MED ORDER — METOPROLOL SUCCINATE ER 25 MG PO TB24: 12.5000 mg | ORAL_TABLET | Freq: Every day | ORAL | Status: DC

## 2014-09-20 NOTE — Telephone Encounter (Signed)
It should be 1 tablet in the morning and one half tablet in the evening, as her husband says

## 2014-09-23 ENCOUNTER — Other Ambulatory Visit: Payer: Self-pay

## 2014-09-23 ENCOUNTER — Other Ambulatory Visit: Payer: Self-pay | Admitting: *Deleted

## 2014-09-23 MED ORDER — METOPROLOL SUCCINATE ER 25 MG PO TB24: ORAL_TABLET | ORAL | Status: DC

## 2014-09-23 NOTE — Telephone Encounter (Signed)
DR BRACKBILL HAS CORRECTED MED ERROR, PLS CALL INTO [facility] , REQUESTING  90 DAYS SUPPLY

## 2014-10-08 IMAGING — CR DG CHEST 2V
2 series · 2 of 2 positions shown · non-contrast
Comparison: 06/19/2012 thoracic spine

CLINICAL DATA: Hypertension

CHEST - 2 VIEW

[view not recorded (1 of 2)]
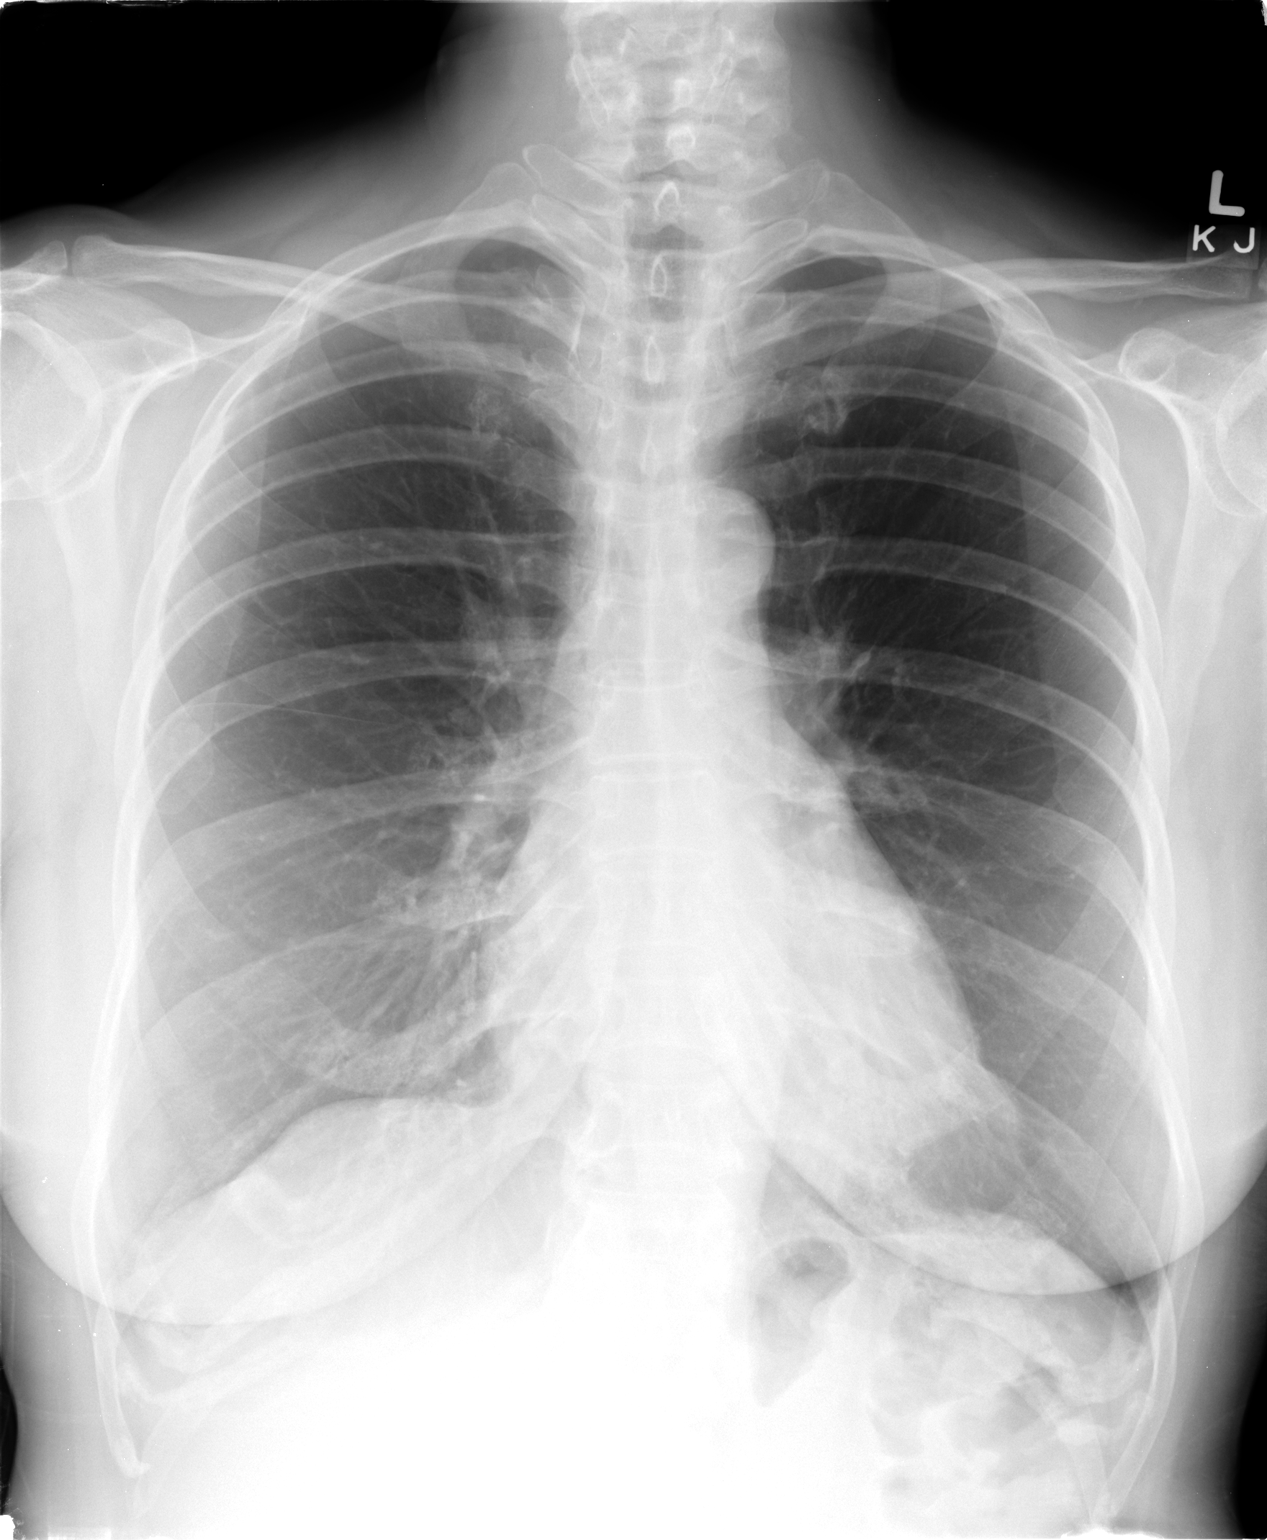

[view not recorded (2 of 2)]
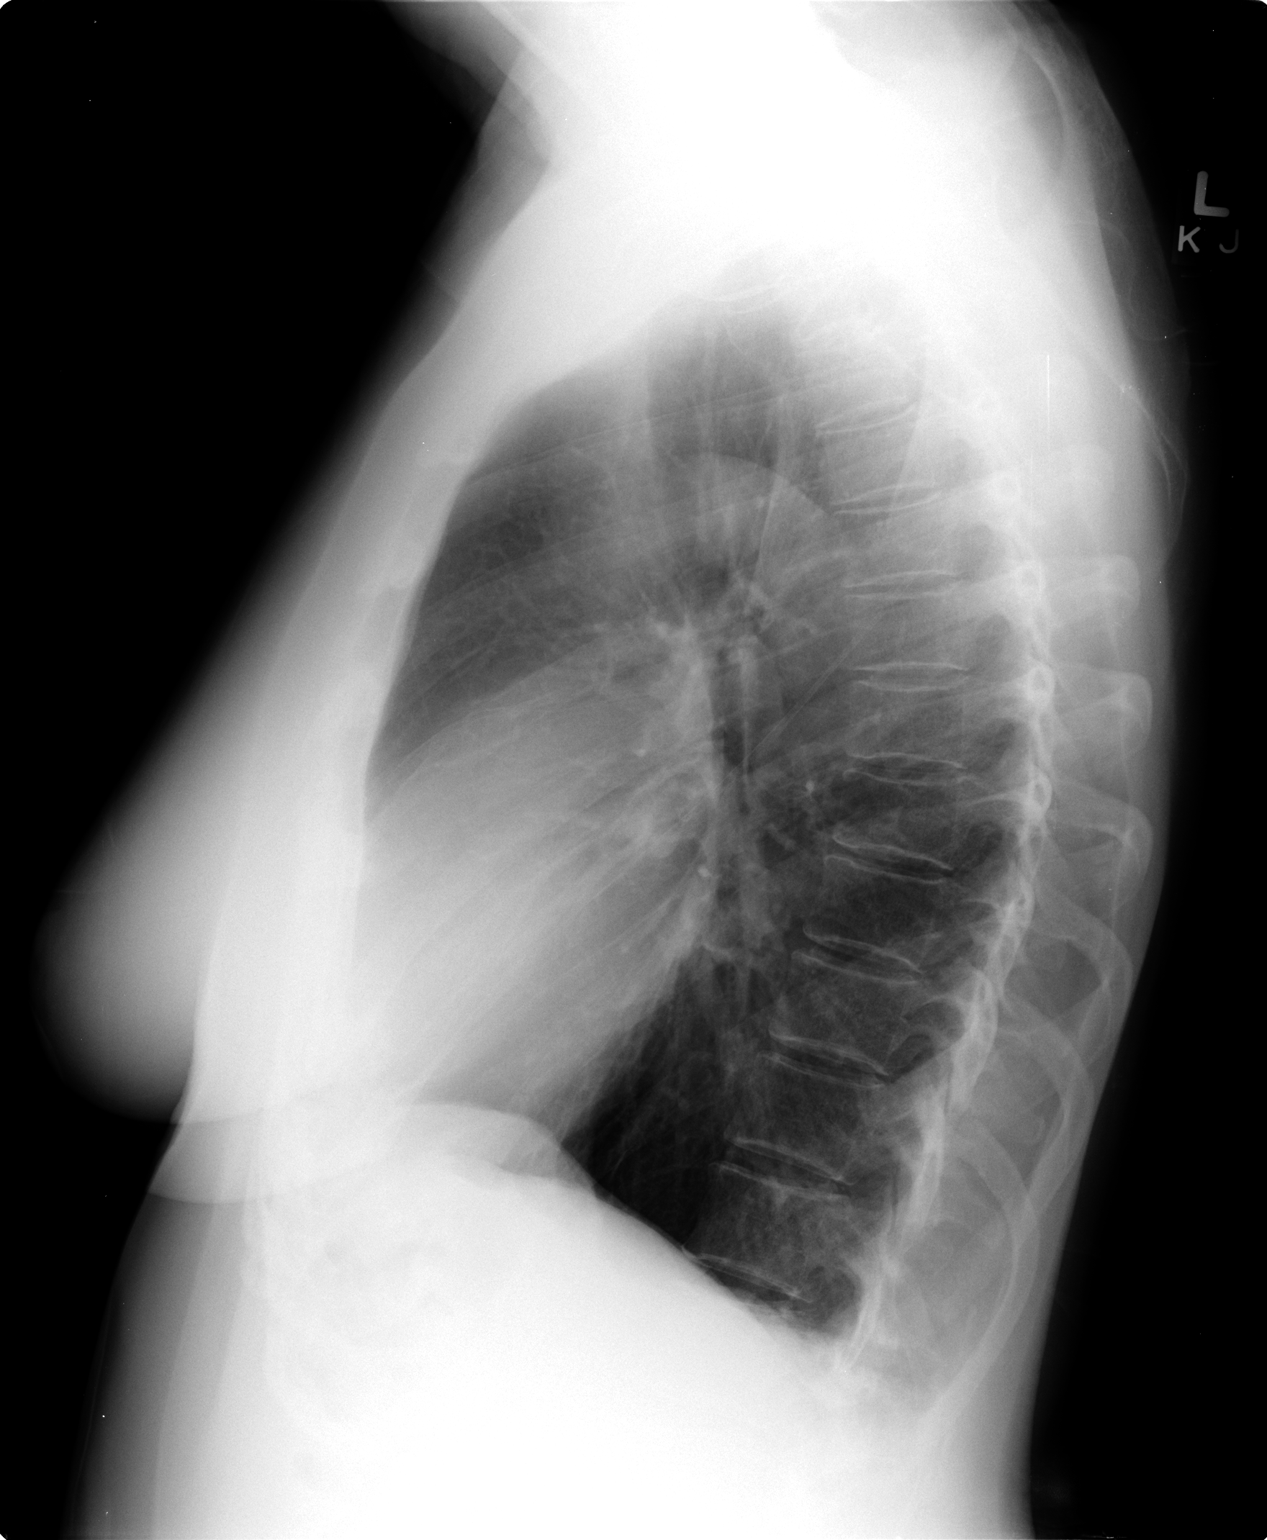

[2 of 2 positions shown; findings below may reference images not displayed]

FINDINGS: Heart size is normal.  Negative for heart failure.
Negative for infiltrate effusion or mass lesion.
IMPRESSION: No acute cardiopulmonary abnormality.

## 2014-11-19 ENCOUNTER — Ambulatory Visit (INDEPENDENT_AMBULATORY_CARE_PROVIDER_SITE_OTHER): Payer: Medicare Other | Admitting: Cardiology

## 2014-11-19 ENCOUNTER — Encounter: Payer: Self-pay | Admitting: Cardiology

## 2014-11-19 VITALS — BP 126/70 | HR 58 | Ht 64.0 in | Wt 156.0 lb

## 2014-11-19 DIAGNOSIS — E7800 Pure hypercholesterolemia, unspecified: Secondary | ICD-10-CM

## 2014-11-19 DIAGNOSIS — K589 Irritable bowel syndrome without diarrhea: Secondary | ICD-10-CM

## 2014-11-19 DIAGNOSIS — I119 Hypertensive heart disease without heart failure: Secondary | ICD-10-CM

## 2014-11-19 NOTE — Patient Instructions (Signed)
Your physician recommends that you continue on your current medications as directed. Please refer to the Current Medication list given to you today.  Your physician wants you to follow-up in: 6 month ov You will receive a reminder letter in the mail two months in advance. If you don't receive a letter, please call our office to schedule the follow-up appointment.  

## 2014-11-19 NOTE — Progress Notes (Signed)
Debbie Wells Date of Birth:  28-Mar-1941 [place] [address] [place], [place]  [address] [phone]        Fax   [phone]   History of Present Illness:  This pleasant 74 year old married Caucasian female is seen for a 4 month followup office visit. We had initially seen her at the request of Dr. Forde Dandy for evaluation of palpitations and poorly controlled high blood pressure.   She had previously worn an outpatient 24-hour blood pressure monitor which showed wide swings of blood pressure.   She has had a 24-hour urine collection for metanephrines which was normal. She has had blood work in Dr. Forde Dandy office which has ruled out Cushing's syndrome.   Her cardiac workup as included a Lexi scan Myoview stress test on 12/07/12 showing no evidence of ischemia and her ejection fraction was 60% and there were no wall motion abnormalities. She had a previous echocardiogram 09/17/11 showing normal LV function and mild mitral regurgitation.   She has had problems with chronic steady pain in the left epigastrium and left upper quadrant and has seen her gastroenterologist Dr. Carlean Purl. She has seen Dr. Mamie Nick regarding question of mesenteric and or celiac artery stenosis as a possible cause of her left upper quadrant pain. Dr. Kellie Simmering did not feel that the quality of her pain suggested mesenteric ischemia.  She has also been seen by vascular surgery at [facility]. They did not feel that she would benefit from surgery. They did recommend some dietary changes which she has been trying including high fiber diet and treatment for constipation.  She is now taking Linzess for her constipation associated with irritable bowel syndrome  Overall since last visit she reports that her blood pressure has been less erratic since she went back to taking a capsule of Microzide 12.5 milligrams every day instead of every other day    Current Outpatient Prescriptions  Medication Sig  Dispense Refill  . ALPRAZolam (XANAX) 0.5 MG tablet Take 0.25-0.5 mg by mouth 2 (two) times daily. Pt takes a full tab at night and half a tab during day    . aspirin 81 MG tablet Take 81 mg by mouth daily.     Marland Kitchen atorvastatin (LIPITOR) 10 MG tablet Take 10 mg by mouth daily.    . benazepril (LOTENSIN) 20 MG tablet Take 1 tablet (20 mg total) by mouth 2 (two) times daily. 60 tablet 5  . DULoxetine (CYMBALTA) 20 MG capsule Take 20 mg by mouth 2 (two) times daily.    Marland Kitchen estrogens, conjugated, (PREMARIN) 0.45 MG tablet Take 0.45 mg by mouth daily.     . hydrochlorothiazide (MICROZIDE) 12.5 MG capsule TAKE ONE CAPSULE EVERY OTHER DAY    . levothyroxine (SYNTHROID, LEVOTHROID) 125 MCG tablet Take 125 mcg by mouth daily.    . Linaclotide (LINZESS) 145 MCG CAPS capsule Take 145 mcg by mouth as needed (Takes every other day.).     Marland Kitchen LOTEMAX 0.5 % ophthalmic suspension Place 1 drop into both eyes daily. As needed.    . Multiple Vitamin (MULTIVITAMIN) capsule Take 1 capsule by mouth daily.    . Omega-3 Fatty Acids (FISH OIL PO) Take 1 tablet by mouth daily.    . traZODone (DESYREL) 100 MG tablet Take 100 mg by mouth at bedtime and may repeat dose one time if needed.   0  . [DISCONTINUED] metoprolol succinate (TOPROL-XL) 25 MG 24 hr tablet Take 1 tablet (25mg ) by mouth every  morning and 1/2 tablet (12.5mg ) by mouth every evening. 135 tablet 1   No current facility-administered medications for this visit.    Allergies  Allergen Reactions  . Iodinated Diagnostic Agents Hives    Per patient she has developed hives and itching in upper trunk of body after last 2 CT scans once she got homes that she has just treated at home with benadryl. She has not told anyone until now. States she was able to bear it   . Hydrocodone-Acetaminophen     Vicodin=REACTION: rash  . Sulfonamide Derivatives     REACTION: rash    Patient Active Problem List   Diagnosis Date Noted  . IBS (irritable bowel syndrome) 03/12/2014    . Personal history of colonic adenoma 02/12/2013  . Abdominal pain, left upper quadrant 01/23/2013  . Chest pain 12/05/2012  . Benign hypertensive heart disease without heart failure 12/05/2012  . Pure hypercholesterolemia 12/05/2012  . Umbilical hernia 15/72/6203  . Chronic  upper abdominal pain 06/24/2012  . HEMORRHOIDS 07/22/2008    History  Smoking status  . Former Smoker  . Types: Cigarettes  . Quit date: 01/23/1993  Smokeless tobacco  . Never Used    History  Alcohol Use  . 1.2 oz/week  . 2 Glasses of wine per week    Family History  Problem Relation Age of Onset  . Esophageal cancer Father   . Stomach cancer Father     mets from esophagus  . Heart disease Mother   . Irritable bowel syndrome Mother   . Kidney disease Mother   . Colon cancer Neg Hx   . Other Mother     cushings disease  . Addison's disease Mother     Review of Systems: Constitutional: no fever chills diaphoresis or fatigue or change in weight.  Head and neck: no hearing loss, no epistaxis, no photophobia or visual disturbance. Respiratory: No cough, shortness of breath or wheezing. Cardiovascular: No chest pain peripheral edema, palpitations. Gastrointestinal: No abdominal distention, no abdominal pain, no change in bowel habits hematochezia or melena. Genitourinary: No dysuria, no frequency, no urgency, no nocturia. Musculoskeletal:No arthralgias, no back pain, no gait disturbance or myalgias. Neurological: No dizziness, no headaches, no numbness, no seizures, no syncope, no weakness, no tremors. Hematologic: No lymphadenopathy, no easy bruising. Psychiatric: No confusion, no hallucinations, no sleep disturbance.    Physical Exam: Filed Vitals:   11/19/14 1505  BP: 126/70  Pulse: 58   the general appearance reveals a well-developed well-nourished woman in no distress.The head and neck exam reveals pupils equal and reactive.  Extraocular movements are full.  There is no scleral  icterus.  The mouth and pharynx are normal.  The neck is supple.  The carotids reveal no bruits.  The jugular venous pressure is normal.  The  thyroid is not enlarged.  There is no lymphadenopathy.  The chest is clear to percussion and auscultation.  There are no rales or rhonchi.  Expansion of the chest is symmetrical.  The precordium is quiet.  The first heart sound is normal.  The second heart sound is physiologically split.  There is no murmur gallop rub or click.  There is no abnormal lift or heave.  The abdomen is soft and nontender.  The bowel sounds are normal.  The liver and spleen are not enlarged.  There are no abdominal masses.  There are no abdominal bruits.  Extremities reveal good pedal pulses.  There is no phlebitis or edema.  There is no  cyanosis or clubbing.  Strength is normal and symmetrical in all extremities.  There is no lateralizing weakness.  There are no sensory deficits.  The skin is warm and dry.  There is no rash.     Assessment / Plan: 1. labile hypertension, improved. 2. left upper quadrant abdominal pain, improved. 3. Hypercholesterolemia 4. chest pain resolved  Plan: She will continue current medication.  I've encouraged her to continue to get regular walking exercise.  She goes to the gym several times a week.  Recheck here in 6 months for follow-up office visit.

## 2015-02-19 ENCOUNTER — Other Ambulatory Visit: Payer: Self-pay | Admitting: Cardiology

## 2015-02-20 NOTE — Telephone Encounter (Signed)
Should this be one tablet qod, which is on last ov note, or one qd which pharmacy is requesting? Please advise. Thanks, MI

## 2015-02-21 NOTE — Telephone Encounter (Signed)
Spoke with patient and she states she has been taking HCTZ 12.5 mg full tablet daily Reviewed last ov and in dictation it confirms full tablet  Refilled Rx as requested

## 2015-03-11 ENCOUNTER — Other Ambulatory Visit: Payer: Self-pay | Admitting: Cardiology

## 2015-03-13 NOTE — Telephone Encounter (Signed)
Discussed with  Dr. Mare Ferrari and ok to fill

## 2015-05-15 ENCOUNTER — Encounter: Payer: Self-pay | Admitting: Cardiology

## 2015-05-15 ENCOUNTER — Ambulatory Visit (INDEPENDENT_AMBULATORY_CARE_PROVIDER_SITE_OTHER): Payer: Medicare Other | Admitting: Cardiology

## 2015-05-15 VITALS — BP 130/57 | HR 61 | Ht 64.0 in | Wt 155.0 lb

## 2015-05-15 DIAGNOSIS — R1012 Left upper quadrant pain: Secondary | ICD-10-CM

## 2015-05-15 DIAGNOSIS — R058 Other specified cough: Secondary | ICD-10-CM

## 2015-05-15 DIAGNOSIS — I119 Hypertensive heart disease without heart failure: Secondary | ICD-10-CM

## 2015-05-15 DIAGNOSIS — R05 Cough: Secondary | ICD-10-CM

## 2015-05-15 DIAGNOSIS — E7800 Pure hypercholesterolemia, unspecified: Secondary | ICD-10-CM

## 2015-05-15 DIAGNOSIS — T464X5A Adverse effect of angiotensin-converting-enzyme inhibitors, initial encounter: Secondary | ICD-10-CM

## 2015-05-15 MED ORDER — LOSARTAN POTASSIUM 50 MG PO TABS: 50.0000 mg | ORAL_TABLET | Freq: Every day | ORAL | Status: DC

## 2015-05-15 NOTE — Progress Notes (Signed)
Cardiology Office Note   Date:  05/15/2015   ID:  Debbie Wells, DOB 1941-08-13, MRN [id number]  PCP:  Sheela Stack, MD  Cardiologist: Darlin Coco MD  Chief Complaint  Patient presents with  . Fatigue  . Cough      History of Present Illness: Debbie Wells is a 74 y.o. female who presents for scheduled follow-up office visit.  This pleasant 74 year old married Caucasian female is seen for a 4 month followup office visit. We had initially seen her at the request of Dr. Forde Dandy for evaluation of palpitations and poorly controlled high blood pressure. She had previously worn an outpatient 24-hour blood pressure monitor which showed wide swings of blood pressure. She has had a 24-hour urine collection for metanephrines which was normal. She has had blood work in Dr. Forde Dandy office which has ruled out Cushing's syndrome. Her cardiac workup as included a Lexi scan Myoview stress test on 12/07/12 showing no evidence of ischemia and her ejection fraction was 60% and there were no wall motion abnormalities. She had a previous echocardiogram 09/17/11 showing normal LV function and mild mitral regurgitation.  She has had problems with chronic steady pain in the left epigastrium and left upper quadrant and has seen her gastroenterologist Dr. Carlean Purl. She has seen Dr. Mamie Nick regarding question of mesenteric and or celiac artery stenosis as a possible cause of her left upper quadrant pain. Dr. Kellie Simmering did not feel that the quality of her pain suggested mesenteric ischemia. She has also been seen by vascular surgery at [facility]. They did not feel that she would benefit from surgery. They did recommend some dietary changes which she has been trying including high fiber diet and treatment for constipation. She is now taking Linzess for her constipation associated with irritable bowel syndrome  The patient brought in a list of her blood pressures.  Her blood pressures have been  very good recently.  Her pulse remains slow in low 50s.  She wonders if this is contributing to some of her fatigue.  She has also developed a tickling cough possibly secondary to benazepril.  Past Medical History  Diagnosis Date  . Anxiety   . Chronic headaches   . Adenomatous colon polyp   . Diverticulosis   . Depression   . Hyperlipidemia   . Hypertension   . Hypothyroidism   . Adenomatous polyp of colon 12/03/2004    39mm  . External hemorrhoids   . Umbilical hernia     Past Surgical History  Procedure Laterality Date  . Spinal lipoma removed  2006  . Abdominal hysterectomy  1975  . Appendectomy  1975  . Colonoscopy  03/03/2010,12/03/2004  . Oophorectomy    . Esophagogastroduodenoscopy  12/16/11    Dr. Silvano Rusk  . Total abdominal hysterectomy       Current Outpatient Prescriptions  Medication Sig Dispense Refill  . ALPRAZolam (XANAX) 0.5 MG tablet Take 0.25-0.5 mg by mouth 2 (two) times daily. Pt takes a full tab at night and half a tab during day    . aspirin 81 MG tablet Take 81 mg by mouth daily.     Marland Kitchen atorvastatin (LIPITOR) 10 MG tablet Take 10 mg by mouth daily.    . DULoxetine (CYMBALTA) 20 MG capsule Take 20 mg by mouth daily.     Marland Kitchen estrogens, conjugated, (PREMARIN) 0.45 MG tablet Take 0.45 mg by mouth daily.     . hydrochlorothiazide (MICROZIDE) 12.5 MG capsule TAKE 1 CAPSULE BY  MOUTH ONCE DAILY 90 capsule 3  . levothyroxine (SYNTHROID, LEVOTHROID) 125 MCG tablet Take 125 mcg by mouth daily.    . Linaclotide (LINZESS) 145 MCG CAPS capsule Take 145 mcg by mouth as needed (Takes every other day. ALSO TAKE DULCOLAX, OR STOOL SOFTNER OR MILK OF MAG).     . LOTEMAX 0.5 % ophthalmic suspension Place 1 drop into both eyes daily. As needed.    . metoprolol succinate (TOPROL-XL) 25 MG 24 hr tablet TAKE 1 TABLET (25MG ) BY MOUTH EVERY MORNING AND 1/2 TABLET (12.5MG ) BY MOUTH EVERY EVENING. (Patient taking differently: TAKE 1 TABLET (25MG ) BY MOUTH EVERY MORNING) 135 tablet 3    . Multiple Vitamin (MULTIVITAMIN) capsule Take 1 capsule by mouth daily.    . Omega-3 Fatty Acids (FISH OIL PO) Take 1 tablet by mouth daily.    . traZODone (DESYREL) 100 MG tablet Take 100 mg by mouth at bedtime and may repeat dose one time if needed.   0  . losartan (COZAAR) 50 MG tablet Take 1 tablet (50 mg total) by mouth daily. 30 tablet 5   No current facility-administered medications for this visit.    Allergies:   Iodinated diagnostic agents; Benazepril; Hydrocodone-acetaminophen; and Sulfonamide derivatives    Social History:  The patient  reports that she quit smoking about 22 years ago. Her smoking use included Cigarettes. She has never used smokeless tobacco. She reports that she drinks about 1.2 oz of alcohol per week. She reports that she does not use illicit drugs.   Family History:  The patient's family history includes Addison's disease in her mother; Esophageal cancer in her father; Heart disease in her mother; Heart failure in her mother; Hypertension in her maternal grandmother and mother; Irritable bowel syndrome in her mother; Kidney disease in her mother; Other in her mother; Stomach cancer in her father; Stroke in her maternal grandmother and mother. There is no history of Colon cancer or Heart attack.    ROS:  Please see the history of present illness.   Otherwise, review of systems are positive for none.   All other systems are reviewed and negative.    PHYSICAL EXAM: VS:  BP 130/57 mmHg  Pulse 61  Ht 5\' 4"  (1.626 m)  Wt 155 lb (70.308 kg)  BMI 26.59 kg/m2 , BMI Body mass index is 26.59 kg/(m^2). GEN: Well nourished, well developed, in no acute distress HEENT: normal Neck: no JVD, carotid bruits, or masses Cardiac: RRR; no murmurs, rubs, or gallops,no edema  Respiratory:  clear to auscultation bilaterally, normal work of breathing GI: soft, nontender, nondistended, + BS MS: no deformity or atrophy Skin: warm and dry, no rash Neuro:  Strength and sensation  are intact Psych: euthymic mood, full affect   EKG:  EKG is ordered today. The ekg ordered today demonstrates normal sinus rhythm at 61 bpm.  Borderline right axis deviation.  No ischemic changes.   Recent Labs: No results found for requested labs within last 365 days.    Lipid Panel No results found for: CHOL, TRIG, HDL, CHOLHDL, VLDL, LDLCALC, LDLDIRECT    Wt Readings from Last 3 Encounters:  05/15/15 155 lb (70.308 kg)  11/19/14 156 lb (70.761 kg)  07/18/14 155 lb (70.308 kg)         ASSESSMENT AND PLAN:  1. labile hypertension, improved. 2. left upper quadrant abdominal pain, improved. 3. Hypercholesterolemia 4. chest pain resolved 5.  Cough suspected to be secondary to ACE inhibitor 6.  Fatigue which may be  secondary to sinus bradycardia.  Plan: We will stop benazepril.  In its place we will start losartan 50 mg daily.  Hopefully this will help her cough. Later, if she is reasonably stable, we will consider cutting back her Toprol to just half a tablet each morning in order to allow a higher pulse rate to see if this will help her energy.  However we will not do that yet since we are also changing her ACE inhibitor.  Recheck in 4 months for follow-up office visit.   Current medicines are reviewed at length with the patient today.  The patient does not have concerns regarding medicines.  The following changes have been made:    Labs/ tests ordered today include:   Orders Placed This Encounter  Procedures  . EKG 12-Lead   Disposition: Recheck in 4 months for follow-up office visit  Signed, Darlin Coco MD 05/15/2015 4:09 PM    [address], [place], [place]  [address] Phone: [phone]; Fax: [phone]

## 2015-05-15 NOTE — Patient Instructions (Signed)
Medication Instructions:  STOP BENAZEPRIL   START LOSARTAN 50 MG DAILY  Labwork: NONE  Testing/Procedures: NONE  Follow-Up: Your physician wants you to follow-up in: [place] will receive a reminder letter in the mail two months in advance. If you don't receive a letter, please call our office to schedule the follow-up appointment.

## 2015-06-12 ENCOUNTER — Other Ambulatory Visit: Payer: Self-pay | Admitting: Cardiology

## 2015-06-12 MED ORDER — LOSARTAN POTASSIUM 50 MG PO TABS
50.0000 mg | ORAL_TABLET | Freq: Every day | ORAL | Status: DC
Start: 2015-06-12 — End: 2015-11-19

## 2015-09-16 ENCOUNTER — Ambulatory Visit: Payer: Medicare Other | Admitting: Cardiology

## 2015-10-06 ENCOUNTER — Ambulatory Visit: Payer: Medicare Other | Admitting: Cardiology

## 2015-11-19 ENCOUNTER — Other Ambulatory Visit: Payer: Self-pay | Admitting: Cardiology

## 2015-11-25 ENCOUNTER — Telehealth: Payer: Self-pay | Admitting: Cardiology

## 2015-11-25 NOTE — Telephone Encounter (Signed)
New message      Pt c/o BP issue: STAT if pt c/o blurred vision, one-sided weakness or slurred speech  1. What are your last 5 BP readings? 160/73,170/72,167/74,186/77,167/72, 148/70 (this am with extra rx)  2. Are you having any other symptoms (ex. Dizziness, headache, blurred vision, passed out)? Headache and blurred vision 3. What is your BP issue? bp is high

## 2015-11-25 NOTE — Telephone Encounter (Signed)
okay

## 2015-11-25 NOTE — Telephone Encounter (Signed)
Spoke with patient moved her appointment from 1/26 to 1/25 Advised ok to take an extra Losartan today if blood pressure elevated as she did yesterday

## 2015-11-26 ENCOUNTER — Encounter: Payer: Self-pay | Admitting: Cardiology

## 2015-11-26 ENCOUNTER — Ambulatory Visit (INDEPENDENT_AMBULATORY_CARE_PROVIDER_SITE_OTHER): Payer: Medicare Other | Admitting: Cardiology

## 2015-11-26 VITALS — BP 150/90 | HR 55 | Ht 64.5 in | Wt 154.0 lb

## 2015-11-26 DIAGNOSIS — R079 Chest pain, unspecified: Secondary | ICD-10-CM

## 2015-11-26 DIAGNOSIS — R05 Cough: Secondary | ICD-10-CM | POA: Diagnosis not present

## 2015-11-26 DIAGNOSIS — I119 Hypertensive heart disease without heart failure: Secondary | ICD-10-CM

## 2015-11-26 DIAGNOSIS — R058 Other specified cough: Secondary | ICD-10-CM

## 2015-11-26 DIAGNOSIS — T464X5A Adverse effect of angiotensin-converting-enzyme inhibitors, initial encounter: Secondary | ICD-10-CM

## 2015-11-26 MED ORDER — METOPROLOL SUCCINATE ER 25 MG PO TB24: 25.0000 mg | ORAL_TABLET | Freq: Two times a day (BID) | ORAL | Status: DC

## 2015-11-26 MED ORDER — LOSARTAN POTASSIUM 50 MG PO TABS: 50.0000 mg | ORAL_TABLET | Freq: Two times a day (BID) | ORAL | Status: DC

## 2015-11-26 NOTE — Patient Instructions (Signed)
Medication Instructions:  Your physician has recommended you make the following change in your medication:  1) INCREASE Losartan to 50 mg by mouth TWICE daily 2) INCREASE Toprol to 25 mg by mouth TWICE daily   Labwork: none  Testing/Procedures: none  Follow-Up: Your physician recommends that you schedule a follow-up appointment in: 3 - 4 weeks with Dr. Mare Ferrari   Any Other Special Instructions Will Be Listed Below (If Applicable).     If you need a refill on your cardiac medications before your next appointment, please call your pharmacy.

## 2015-11-26 NOTE — Progress Notes (Signed)
Cardiology Office Note   Date:  11/26/2015   ID:  Debbie Wells, DOB Jul 17, 1941, MRN [id number]  PCP:  Sheela Stack, MD  Cardiologist: Darlin Coco MD  Chief Complaint  Patient presents with  . Hypertension    Patient denies chest pain, shortness of breath, le edema, claudication      History of Present Illness: Debbie Wells is a 75 y.o. female who presents for  Work in office visit   she comes in because of recent poorly controlled hypertension.. We had initially seen her at the request of Dr. Forde Dandy for evaluation of palpitations and poorly controlled high blood pressure. She had previously worn an outpatient 24-hour blood pressure monitor which showed wide swings of blood pressure. She has had a 24-hour urine collection for metanephrines which was normal. She has had blood work in Dr. Forde Dandy office which has ruled out Cushing's syndrome. Her cardiac workup as included a Lexi scan Myoview stress test on 12/07/12 showing no evidence of ischemia and her ejection fraction was 60% and there were no wall motion abnormalities. She had a previous echocardiogram 09/17/11 showing normal LV function and mild mitral regurgitation.  She has had problems with chronic steady pain in the left epigastrium and left upper quadrant and has seen her gastroenterologist Dr. Carlean Purl. She has seen Dr. Mamie Nick regarding question of mesenteric and or celiac artery stenosis as a possible cause of her left upper quadrant pain. Dr. Kellie Simmering did not feel that the quality of her pain suggested mesenteric ischemia. She has also been seen by vascular surgery at [facility] They did not feel that she would benefit from surgery. They did recommend some dietary changes which she has been trying including high fiber diet and treatment for constipation.   at her last visit she was complaining of a tickling cough. Her cough resolved when we stopped her ACE inhibitor and switched her to losartan.  She brought in a list of her blood pressures over the past week.  She has been experiencing systolic hypertension in the range of 0000000 systolic. We checked her machine at home and her machine appears to be slightly high on the systolic but not on the diastolic.  When her blood pressure is elevated she feels a mild headache and slightly blurred vision.  She has had occasional left-sided chest discomfort.  Past Medical History  Diagnosis Date  . Anxiety   . Chronic headaches   . Adenomatous colon polyp   . Diverticulosis   . Depression   . Hyperlipidemia   . Hypertension   . Hypothyroidism   . Adenomatous polyp of colon 12/03/2004    70mm  . External hemorrhoids   . Umbilical hernia     Past Surgical History  Procedure Laterality Date  . Spinal lipoma removed  2006  . Abdominal hysterectomy  1975  . Appendectomy  1975  . Colonoscopy  03/03/2010,12/03/2004  . Oophorectomy    . Esophagogastroduodenoscopy  12/16/11    Dr. Silvano Rusk  . Total abdominal hysterectomy       Current Outpatient Prescriptions  Medication Sig Dispense Refill  . ALPRAZolam (XANAX) 0.5 MG tablet Take 0.25-0.5 mg by mouth 2 (two) times daily. Pt takes a full tab at night and half a tab during day    . aspirin 81 MG tablet Take 81 mg by mouth daily.     Marland Kitchen atorvastatin (LIPITOR) 10 MG tablet Take 10 mg by mouth daily.    . bacitracin ophthalmic  ointment Place 1 application into both eyes as needed. Dry eyes  0  . escitalopram (LEXAPRO) 10 MG tablet Take 10 mg by mouth daily.  5  . estrogens, conjugated, (PREMARIN) 0.45 MG tablet Take 0.45 mg by mouth daily.     . hydrochlorothiazide (MICROZIDE) 12.5 MG capsule TAKE 1 CAPSULE BY MOUTH ONCE DAILY 90 capsule 3  . levothyroxine (SYNTHROID, LEVOTHROID) 125 MCG tablet Take 125 mcg by mouth daily.    . Linaclotide (LINZESS) 145 MCG CAPS capsule Take 145 mcg by mouth daily as needed (consitpation).     Marland Kitchen losartan (COZAAR) 50 MG tablet Take 1 tablet (50 mg total) by  mouth 2 (two) times daily. 180 tablet 1  . LOTEMAX 0.5 % ophthalmic suspension Place 1 drop into both eyes daily. As needed. For dry eyes    . metoprolol succinate (TOPROL-XL) 25 MG 24 hr tablet Take 1 tablet (25 mg total) by mouth 2 (two) times daily. 180 tablet 1  . Multiple Vitamin (MULTIVITAMIN) capsule Take 1 capsule by mouth daily.    . Omega-3 Fatty Acids (FISH OIL PO) Take 1 tablet by mouth daily.    . traZODone (DESYREL) 100 MG tablet Take 100 mg by mouth at bedtime.   0   No current facility-administered medications for this visit.    Allergies:   Iodinated diagnostic agents; Benazepril; Hydrocodone-acetaminophen; and Sulfonamide derivatives    Social History:  The patient  reports that she quit smoking about 22 years ago. Her smoking use included Cigarettes. She has never used smokeless tobacco. She reports that she drinks about 1.2 oz of alcohol per week. She reports that she does not use illicit drugs.   Family History:  The patient's family history includes Addison's disease in her mother; Esophageal cancer in her father; Heart disease in her mother; Heart failure in her mother; Hypertension in her maternal grandmother and mother; Irritable bowel syndrome in her mother; Kidney disease in her mother; Other in her mother; Stomach cancer in her father; Stroke in her maternal grandmother and mother. There is no history of Colon cancer or Heart attack.    ROS:  Please see the history of present illness.   Otherwise, review of systems are positive for none.   All other systems are reviewed and negative.    PHYSICAL EXAM: VS:  BP 150/90 mmHg  Pulse 55  Ht 5' 4.5" (1.638 m)  Wt 154 lb (69.854 kg)  BMI 26.04 kg/m2 , BMI Body mass index is 26.04 kg/(m^2). GEN: Well nourished, well developed, in no acute distress HEENT: normal Neck: no JVD, carotid bruits, or masses Cardiac: RRR; no murmurs, rubs, or gallops,no edema  Respiratory:  clear to auscultation bilaterally, normal work of  breathing GI: soft, nontender, nondistended, + BS MS: no deformity or atrophy Skin: warm and dry, no rash Neuro:  Strength and sensation are intact Psych: euthymic mood, full affect   EKG:  EKG is not ordered today.    Recent Labs: No results found for requested labs within last 365 days.    Lipid Panel No results found for: CHOL, TRIG, HDL, CHOLHDL, VLDL, LDLCALC, LDLDIRECT    Wt Readings from Last 3 Encounters:  11/26/15 154 lb (69.854 kg)  05/15/15 155 lb (70.308 kg)  11/19/14 156 lb (70.761 kg)        ASSESSMENT AND PLAN:  1. labile hypertension,  Predominantly systolic hypertension 2. left upper quadrant abdominal pain, improved. 3. Hypercholesterolemia 4. chest pain with prior negative ischemic workup 5. Cough  suspected to be secondary to ACE inhibitor, resolved since switching from Ace to ARB 6. Chronic anxiety    Current medicines are reviewed at length with the patient today.  The patient does not have concerns regarding medicines.  The following changes have been made:   For better blood pressure control we are increasing her losartan and up to 50 mg twice a day and increasing her Toprol to 25 mg twice a day. She states that primarily her hypertension is worse in the evenings  Labs/ tests ordered today include:  No orders of the defined types were placed in this encounter.     disposition: follow-up visit with Dr. Mare Ferrari in 3-4 weeks and after that make a decision about subsequent follow-up visits.  Berna Spare MD 11/26/2015 1:30 PM    [facility] [place], [place], [place]  [address] Phone: [phone]; Fax: [phone]

## 2015-11-27 ENCOUNTER — Ambulatory Visit: Payer: Medicare Other | Admitting: Cardiology

## 2015-12-12 NOTE — Telephone Encounter (Signed)
error 

## 2015-12-24 ENCOUNTER — Ambulatory Visit (INDEPENDENT_AMBULATORY_CARE_PROVIDER_SITE_OTHER): Payer: Medicare Other | Admitting: Cardiology

## 2015-12-24 ENCOUNTER — Encounter: Payer: Self-pay | Admitting: Cardiology

## 2015-12-24 VITALS — BP 140/80 | HR 48 | Ht 64.5 in | Wt 156.0 lb

## 2015-12-24 DIAGNOSIS — I119 Hypertensive heart disease without heart failure: Secondary | ICD-10-CM

## 2015-12-24 DIAGNOSIS — R001 Bradycardia, unspecified: Secondary | ICD-10-CM | POA: Diagnosis not present

## 2015-12-24 NOTE — Progress Notes (Signed)
Cardiology Office Note   Date:  12/24/2015   ID:  Debbie, Wells [birth year], MRN [id number]  PCP:  Sheela Stack, MD  Cardiologist: Darlin Coco MD  Chief Complaint  Patient presents with  . scheduled follow up      History of Present Illness: Debbie Wells is a 75 y.o. female who presents for scheduled follow-up visit  She has a history of hypertension which has been somewhat difficult to control.  We had initially seen her at the request of Dr. Forde Dandy for evaluation of palpitations and poorly ccontrolled high blood pressure. She had previously worn an outpatient 24-hour blood pressure monitor which showed wide swings of blood pressure. She has had a 24-hour urine collection for metanephrines which was normal. She has had blood work in Dr. Forde Dandy office which has ruled out Cushing's syndrome. Her cardiac workup as included a Lexi scan Myoview stress test on 12/07/12 showing no evidence of ischemia and her ejection fraction was 60% and there were no wall motion abnormalities. She had a previous echocardiogram 09/17/11 showing normal LV function and mild mitral regurgitation.  She has had problems with chronic steady pain in the left epigastrium and left upper quadrant and has seen her gastroenterologist Dr. Carlean Purl. She has seen Dr. Mamie Nick regarding question of mesenteric and or celiac artery stenosis as a possible cause of her left upper quadrant pain. Dr. Kellie Simmering did not feel that the quality of her pain suggested mesenteric ischemia. She has also been seen by vascular surgery at [facility]. They did not feel that she would benefit from surgery. They did recommend some dietary changes which she has been trying including high fiber diet and treatment for constipation.  at her last visit she was complaining of a tickling cough. Her cough resolved when we stopped her ACE inhibitor and switched her to losartan. She brought in a list of her blood pressures  over the past week. She has been experiencing systolic hypertension in the range of 0000000 systolic. We checked her machine at home and her machine appears to be slightly high on the systolic but not on the diastolic.  Her last visit because of her elevated blood pressure we increased her losartan to twice a day and also increased her metoprolol succinate to twice a day. Since then her blood pressure has been better controlled but she complains of fatigue related to her bradycardia.  Her heart rate today is 48.   Past Medical History  Diagnosis Date  . Anxiety   . Chronic headaches   . Adenomatous colon polyp   . Diverticulosis   . Depression   . Hyperlipidemia   . Hypertension   . Hypothyroidism   . Adenomatous polyp of colon 12/03/2004    18mm  . External hemorrhoids   . Umbilical hernia     Past Surgical History  Procedure Laterality Date  . Spinal lipoma removed  2006  . Abdominal hysterectomy  1975  . Appendectomy  1975  . Colonoscopy  03/03/2010,12/03/2004  . Oophorectomy    . Esophagogastroduodenoscopy  12/16/11    Dr. Silvano Rusk  . Total abdominal hysterectomy       Current Outpatient Prescriptions  Medication Sig Dispense Refill  . ALPRAZolam (XANAX) 0.5 MG tablet Take 0.25-0.5 mg by mouth 2 (two) times daily. Pt takes a full tab at night and half a tab during day    . aspirin 81 MG tablet Take 81 mg by mouth daily.     Marland Kitchen  atorvastatin (LIPITOR) 10 MG tablet Take 10 mg by mouth daily.    . bacitracin ophthalmic ointment Place 1 application into both eyes as needed. Dry eyes  0  . escitalopram (LEXAPRO) 10 MG tablet Take 10 mg by mouth daily.  5  . estrogens, conjugated, (PREMARIN) 0.45 MG tablet Take 0.45 mg by mouth daily.     . hydrochlorothiazide (MICROZIDE) 12.5 MG capsule TAKE 1 CAPSULE BY MOUTH ONCE DAILY 90 capsule 3  . levothyroxine (SYNTHROID, LEVOTHROID) 125 MCG tablet Take 125 mcg by mouth daily.    . Linaclotide (LINZESS) 145 MCG CAPS capsule Take 145 mcg by  mouth daily as needed (consitpation).     Marland Kitchen losartan (COZAAR) 50 MG tablet Take 1 tablet (50 mg total) by mouth 2 (two) times daily. 180 tablet 1  . LOTEMAX 0.5 % ophthalmic suspension Place 1 drop into both eyes daily. As needed. For dry eyes    . metoprolol succinate (TOPROL-XL) 25 MG 24 hr tablet Take 1 tablet (25 mg total) by mouth 2 (two) times daily. 180 tablet 1  . Multiple Vitamin (MULTIVITAMIN) capsule Take 1 capsule by mouth daily.    . Omega-3 Fatty Acids (FISH OIL PO) Take 1 tablet by mouth daily.    . traZODone (DESYREL) 100 MG tablet Take 100 mg by mouth at bedtime.   0   No current facility-administered medications for this visit.    Allergies:   Iodinated diagnostic agents; Benazepril; Hydrocodone-acetaminophen; and Sulfonamide derivatives    Social History:  The patient  reports that she quit smoking about 22 years ago. Her smoking use included Cigarettes. She has never used smokeless tobacco. She reports that she drinks about 1.2 oz of alcohol per week. She reports that she does not use illicit drugs.   Family History:  The patient's family history includes Addison's disease in her mother; Esophageal cancer in her father; Heart disease in her mother; Heart failure in her mother; Hypertension in her maternal grandmother and mother; Irritable bowel syndrome in her mother; Kidney disease in her mother; Other in her mother; Stomach cancer in her father; Stroke in her maternal grandmother and mother. There is no history of Colon cancer or Heart attack.    ROS:  Please see the history of present illness.   Otherwise, review of systems are positive for none.   All other systems are reviewed and negative.    PHYSICAL EXAM: VS:  BP 140/80 mmHg  Pulse 48  Ht 5' 4.5" (1.638 m)  Wt 156 lb (70.761 kg)  BMI 26.37 kg/m2  SpO2 99% , BMI Body mass index is 26.37 kg/(m^2). GEN: Well nourished, well developed, in no acute distress HEENT: normal Neck: no JVD, carotid bruits, or  masses Cardiac: RRR; no murmurs, rubs, or gallops,no edema  Respiratory:  clear to auscultation bilaterally, normal work of breathing GI: soft, nontender, nondistended, + BS MS: no deformity or atrophy Skin: warm and dry, no rash Neuro:  Strength and sensation are intact Psych: euthymic mood, full affect   EKG:  EKG is not ordered today.    Recent Labs: No results found for requested labs within last 365 days.    Lipid Panel No results found for: CHOL, TRIG, HDL, CHOLHDL, VLDL, LDLCALC, LDLDIRECT    Wt Readings from Last 3 Encounters:  12/24/15 156 lb (70.761 kg)  11/26/15 154 lb (69.854 kg)  05/15/15 155 lb (70.308 kg)         ASSESSMENT AND PLAN:  1. labile hypertension, Predominantly systolic  hypertension, with improved control since last visit. 2. left upper quadrant abdominal pain, improved. 3. Hypercholesterolemia.  Followed by Dr. Forde Dandy 4. chest pain with prior negative ischemic workup 5. Cough suspected to be secondary to ACE inhibitor, resolved since switching from Ace to ARB 6. Chronic anxiety 7.  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  The patient has concerns regarding medicines.  The following changes have been made:  We will reduce her Toprol to just 25 mg in the morning and stop the half tablet in the evening. She will continue to monitor her blood pressure closely.  If her blood pressure starts to creep back up, consider adding low-dose amlodipine to her regimen.  Labs/ tests ordered today include:  No orders of the defined types were placed in this encounter.     Disposition:   Following my retirement she will follow-up with Dr. Stanford Breed in April for follow-up office visit  Signed, Darlin Coco MD 12/24/2015 2:10 PM    [address], [place], [place]  [address] Phone: [phone]; Fax: [phone]

## 2015-12-24 NOTE — Patient Instructions (Signed)
Medication Instructions:  DECREASE YOUR TOPROL (METOPROLOL) TO 25 MG IN THE MORNING ONLY  Labwork: NONE  Testing/Procedures: NONE  Follow-Up: Your physician recommends that you schedule a follow-up appointment in: [place]   Any Other Special Instructions Will Be Listed Below (If Applicable). MONITOR YOUR BLOOD PRESSURE AND CALL THE OFFICE IF ELEVATED   If you need a refill on your cardiac medications before your next appointment, please call your pharmacy.

## 2015-12-25 ENCOUNTER — Encounter: Payer: Self-pay | Admitting: Cardiology

## 2016-02-14 NOTE — Progress Notes (Signed)
HPI: FU hypertension; previously followed by Dr Mare Ferrari.  She has a history of hypertension which has been somewhat difficult to control.She had previously worn an outpatient 24-hour blood pressure monitor which showed wide swings of blood pressure.She has had a 24-hour urine collection for metanephrines which was normal. She has had blood work in Dr. Forde Dandy office which has ruled out Cushing's syndrome.Her cardiac workup has included a Lexiscan Myoview stress test on 12/07/12 showing no evidence of ischemia and her ejection fraction was 60% and there were no wall motion abnormalities. She had a previous echocardiogram 09/17/11 showing normal LV function and mild mitral regurgitation. Renal dopplers 2014 showed mild 1-59 right RAS. She has had problems with chronic steady pain in the left epigastrium and left upper quadrant and has seen her gastroenterologist Dr. Carlean Purl. She has seen Dr. Mamie Nick regarding question of mesenteric and or celiac artery stenosis as a possible cause of her left upper quadrant pain. Dr. Kellie Simmering did not feel that the quality of her pain suggested mesenteric ischemia. She has also been seen by vascular surgery at [facility]. They did not feel that she would benefit from surgery. Previous cough with ACEI. Bradycardia with higher doses of beta blocker. Since last seen, the patient denies any dyspnea on exertion, orthopnea, PND, pedal edema, palpitations, syncope or chest pain.   Current Outpatient Prescriptions  Medication Sig Dispense Refill  . ALPRAZolam (XANAX) 0.5 MG tablet Take 0.25-0.5 mg by mouth 2 (two) times daily. Pt takes a full tab at night and half a tab during day    . aspirin 81 MG tablet Take 81 mg by mouth daily.     Marland Kitchen atorvastatin (LIPITOR) 10 MG tablet Take 10 mg by mouth daily.    . bacitracin ophthalmic ointment Place 1 application into both eyes as needed. Dry eyes  0  . escitalopram (LEXAPRO) 10 MG tablet Take 10 mg by mouth daily.  5    . estrogens, conjugated, (PREMARIN) 0.45 MG tablet Take 0.45 mg by mouth daily.     . hydrochlorothiazide (MICROZIDE) 12.5 MG capsule TAKE 1 CAPSULE BY MOUTH ONCE DAILY 90 capsule 3  . levothyroxine (SYNTHROID, LEVOTHROID) 125 MCG tablet Take 125 mcg by mouth daily.    . Linaclotide (LINZESS) 145 MCG CAPS capsule Take 145 mcg by mouth daily as needed (consitpation).     Marland Kitchen losartan (COZAAR) 50 MG tablet Take 1 tablet (50 mg total) by mouth 2 (two) times daily. 180 tablet 1  . LOTEMAX 0.5 % ophthalmic suspension Place 1 drop into both eyes daily. As needed. For dry eyes    . metoprolol succinate (TOPROL-XL) 25 MG 24 hr tablet Take 25 mg by mouth daily.    . Multiple Vitamin (MULTIVITAMIN) capsule Take 1 capsule by mouth daily.    . Omega-3 Fatty Acids (FISH OIL PO) Take 1 tablet by mouth daily.    . traZODone (DESYREL) 100 MG tablet Take 100 mg by mouth at bedtime.   0   No current facility-administered medications for this visit.     Past Medical History  Diagnosis Date  . Anxiety   . Chronic headaches   . Adenomatous colon polyp   . Diverticulosis   . Depression   . Hyperlipidemia   . Hypertension   . Hypothyroidism   . Adenomatous polyp of colon 12/03/2004    48mm  . External hemorrhoids   . Umbilical hernia     Past Surgical History  Procedure Laterality Date  .  Spinal lipoma removed  2006  . Abdominal hysterectomy  1975  . Appendectomy  1975  . Colonoscopy  03/03/2010,12/03/2004  . Oophorectomy    . Esophagogastroduodenoscopy  12/16/11    Dr. Silvano Rusk  . Total abdominal hysterectomy      Social History   Social History  . Marital Status: Married    Spouse Name: N/A  . Number of Children: 2  . Years of Education: N/A   Occupational History  . retired    Social History Main Topics  . Smoking status: Former Smoker    Types: Cigarettes    Quit date: 01/23/1993  . Smokeless tobacco: Never Used  . Alcohol Use: 1.2 oz/week    2 Glasses of wine per week  . Drug  Use: No  . Sexual Activity: Not on file   Other Topics Concern  . Not on file   Social History Narrative    Family History  Problem Relation Age of Onset  . Esophageal cancer Father   . Stomach cancer Father     mets from esophagus  . Heart disease Mother   . Irritable bowel syndrome Mother   . Kidney disease Mother   . Colon cancer Neg Hx   . Other Mother     cushings disease  . Addison's disease Mother   . Heart failure Mother   . Heart attack Neg Hx   . Stroke Mother   . Stroke Maternal Grandmother   . Hypertension Mother   . Hypertension Maternal Grandmother     ROS: Chronic left upper quadrant pain and constipation but no fevers or chills, productive cough, hemoptysis, dysphasia, odynophagia, melena, hematochezia, dysuria, hematuria, rash, seizure activity, orthopnea, PND, pedal edema, claudication. Remaining systems are negative.  Physical Exam: Well-developed well-nourished in no acute distress.  Skin is warm and dry.  HEENT is normal.  Neck is supple.  Chest is clear to auscultation with normal expansion.  Cardiovascular exam is regular rate and rhythm.  Abdominal exam nontender or distended. No masses palpated. Extremities show no edema. neuro grossly intact  ECG Sinus bradycardia at a rate of 55. No ST changes.

## 2016-02-16 ENCOUNTER — Encounter: Payer: Self-pay | Admitting: Cardiology

## 2016-02-16 ENCOUNTER — Ambulatory Visit (INDEPENDENT_AMBULATORY_CARE_PROVIDER_SITE_OTHER): Payer: Medicare Other | Admitting: Cardiology

## 2016-02-16 VITALS — BP 128/66 | HR 55 | Ht 64.0 in | Wt 155.0 lb

## 2016-02-16 DIAGNOSIS — R1012 Left upper quadrant pain: Secondary | ICD-10-CM | POA: Diagnosis not present

## 2016-02-16 DIAGNOSIS — I10 Essential (primary) hypertension: Secondary | ICD-10-CM | POA: Insufficient documentation

## 2016-02-16 DIAGNOSIS — E7800 Pure hypercholesterolemia, unspecified: Secondary | ICD-10-CM

## 2016-02-16 DIAGNOSIS — R1011 Right upper quadrant pain: Secondary | ICD-10-CM | POA: Diagnosis not present

## 2016-02-16 DIAGNOSIS — G8929 Other chronic pain: Secondary | ICD-10-CM

## 2016-02-16 NOTE — Assessment & Plan Note (Signed)
No change in symptoms. Management per primary care.

## 2016-02-16 NOTE — Assessment & Plan Note (Signed)
Blood pressure controlled. Continue present medications. Note approximately 20 minutes reviewing records prior to patient arrival.

## 2016-02-16 NOTE — Assessment & Plan Note (Signed)
Continue statin. 

## 2016-02-16 NOTE — Patient Instructions (Signed)
  Follow-Up:  Your physician wants you to follow-up in: [place] will receive a reminder letter in the mail two months in advance. If you don't receive a letter, please call our office to schedule the follow-up appointment.   If you need a refill on your cardiac medications before your next appointment, please call your pharmacy.

## 2016-03-23 ENCOUNTER — Other Ambulatory Visit: Payer: Self-pay

## 2016-03-23 MED ORDER — HYDROCHLOROTHIAZIDE 12.5 MG PO CAPS: 12.5000 mg | ORAL_CAPSULE | Freq: Every day | ORAL | Status: DC

## 2016-03-23 NOTE — Telephone Encounter (Signed)
Rx(s) sent to pharmacy electronically.  

## 2016-05-31 DIAGNOSIS — R252 Cramp and spasm: Secondary | ICD-10-CM | POA: Insufficient documentation

## 2016-07-01 ENCOUNTER — Other Ambulatory Visit: Payer: Self-pay

## 2016-07-01 MED ORDER — LOSARTAN POTASSIUM 50 MG PO TABS: 75.0000 mg | ORAL_TABLET | Freq: Every day | ORAL | 2 refills | Status: DC

## 2016-08-11 ENCOUNTER — Encounter: Payer: Self-pay | Admitting: Cardiology

## 2016-08-15 NOTE — Progress Notes (Signed)
HPI: FU hypertension. She has a history of hypertension which has been somewhat difficult to control.She had previously worn an outpatient 24-hour blood pressure monitor which showed wide swings of blood pressure.She has had a 24-hour urine collection for metanephrines which was normal. She has had blood work in Dr. Forde Dandy office which has ruled out Cushing's syndrome.Her cardiac workup has included a Lexiscan Myoview stress test on 12/07/12 showing no evidence of ischemia and her ejection fraction was 60% and there were no wall motion abnormalities. She had a previous echocardiogram 09/17/11 showing normal LV function and mild mitral regurgitation. Renal dopplers 2014 showed mild 1-59 right RAS. She has had problems with chronic steady pain in the left epigastrium and left upper quadrant and has seen her gastroenterologist Dr. Carlean Purl. She has seen Dr. Mamie Nick regarding question of mesenteric and or celiac artery stenosis as a possible cause of her left upper quadrant pain. Dr. Kellie Simmering did not feel that the quality of her pain suggested mesenteric ischemia. She has also been seen by vascular surgery at [facility]. They did not feel that she would benefit from surgery. Previous cough with ACEI. Bradycardia with higher doses of beta blocker. Since last seen, the patient denies any dyspnea on exertion, orthopnea, PND, pedal edema, palpitations, syncope or chest pain.   Current Outpatient Prescriptions  Medication Sig Dispense Refill  . ALPRAZolam (XANAX) 0.5 MG tablet Take 0.25-0.5 mg by mouth 2 (two) times daily. Pt takes a full tab at night and half a tab during day    . aspirin 81 MG tablet Take 81 mg by mouth daily.     . bacitracin ophthalmic ointment Place 1 application into both eyes as needed. Dry eyes  0  . Biotin 1000 MCG tablet Take 1,000 mcg by mouth daily.    Marland Kitchen escitalopram (LEXAPRO) 10 MG tablet Take 10 mg by mouth daily.  5  . estrogens, conjugated, (PREMARIN) 0.45 MG  tablet Take 0.45 mg by mouth daily.     . hydrochlorothiazide (MICROZIDE) 12.5 MG capsule Take 1 capsule (12.5 mg total) by mouth daily. 90 capsule 3  . levothyroxine (SYNTHROID, LEVOTHROID) 125 MCG tablet Take 125 mcg by mouth daily.    . Linaclotide (LINZESS) 145 MCG CAPS capsule Take 145 mcg by mouth daily as needed (consitpation).     Marland Kitchen losartan (COZAAR) 50 MG tablet Take 50 mg by mouth daily.    Marland Kitchen LOTEMAX 0.5 % ophthalmic suspension Place 1 drop into both eyes daily. As needed. For dry eyes    . metoprolol succinate (TOPROL-XL) 25 MG 24 hr tablet Take 12.5 mg by mouth daily.     . Multiple Vitamin (MULTIVITAMIN) capsule Take 1 capsule by mouth daily.    . Multiple Vitamins-Minerals (HAIR SKIN AND NAILS FORMULA PO) Take 1 capsule by mouth daily.    . Omega-3 Fatty Acids (FISH OIL PO) Take 1 tablet by mouth daily.    . traZODone (DESYREL) 100 MG tablet Take 100 mg by mouth at bedtime.   0   No current facility-administered medications for this visit.      Past Medical History:  Diagnosis Date  . Adenomatous colon polyp   . Adenomatous polyp of colon 12/03/2004   8mm  . Anxiety   . Chronic headaches   . Depression   . Diverticulosis   . External hemorrhoids   . Hyperlipidemia   . Hypertension   . Hypothyroidism   . Umbilical hernia     Past  Surgical History:  Procedure Laterality Date  . ABDOMINAL HYSTERECTOMY  1975  . APPENDECTOMY  1975  . COLONOSCOPY  03/03/2010,12/03/2004  . ESOPHAGOGASTRODUODENOSCOPY  12/16/11   Dr. Silvano Rusk  . OOPHORECTOMY    . spinal lipoma removed  2006  . TOTAL ABDOMINAL HYSTERECTOMY      Social History   Social History  . Marital status: Married    Spouse name: N/A  . Number of children: 2  . Years of education: N/A   Occupational History  . retired    Social History Main Topics  . Smoking status: Former Smoker    Types: Cigarettes    Quit date: 01/23/1993  . Smokeless tobacco: Never Used  . Alcohol use 1.2 oz/week    2 Glasses of  wine per week  . Drug use: No  . Sexual activity: Not on file   Other Topics Concern  . Not on file   Social History Narrative  . No narrative on file    Family History  Problem Relation Age of Onset  . Esophageal cancer Father   . Stomach cancer Father     mets from esophagus  . Heart disease Mother   . Irritable bowel syndrome Mother   . Kidney disease Mother   . Other Mother     cushings disease  . Addison's disease Mother   . Heart failure Mother   . Stroke Mother   . Hypertension Mother   . Stroke Maternal Grandmother   . Hypertension Maternal Grandmother   . Colon cancer Neg Hx   . Heart attack Neg Hx     ROS: fatigue but no fevers or chills, productive cough, hemoptysis, dysphasia, odynophagia, melena, hematochezia, dysuria, hematuria, rash, seizure activity, orthopnea, PND, pedal edema, claudication. Remaining systems are negative.  Physical Exam: Well-developed well-nourished in no acute distress.  Skin is warm and dry.  HEENT is normal.  Neck is supple.  Chest is clear to auscultation with normal expansion.  Cardiovascular exam is regular rate and rhythm.  Abdominal exam nontender or distended. No masses palpated. Extremities show no edema. neuro grossly intact  ECG-Normal sinus rhythm at a rate of 61. No ST changes.  A/P  1 Hyperlipidemia-patient developed myalgias with Lipitor. She has not tolerated Zocor or Crestor previously. I will try Pravachol 40 mg daily. Check lipids and liver in 4 weeks.  2 hypertension-blood pressure controlled. Continue present medications.  3 chronic upper abdominal pain-management per primary care.     Kirk Ruths

## 2016-08-23 ENCOUNTER — Ambulatory Visit (INDEPENDENT_AMBULATORY_CARE_PROVIDER_SITE_OTHER): Payer: Medicare Other | Admitting: Cardiology

## 2016-08-23 ENCOUNTER — Encounter: Payer: Self-pay | Admitting: Cardiology

## 2016-08-23 VITALS — BP 136/76 | HR 61 | Ht 64.5 in | Wt 156.6 lb

## 2016-08-23 DIAGNOSIS — I10 Essential (primary) hypertension: Secondary | ICD-10-CM | POA: Diagnosis not present

## 2016-08-23 DIAGNOSIS — E785 Hyperlipidemia, unspecified: Secondary | ICD-10-CM | POA: Diagnosis not present

## 2016-08-23 MED ORDER — PRAVASTATIN SODIUM 40 MG PO TABS: 40.0000 mg | ORAL_TABLET | Freq: Every evening | ORAL | 3 refills | Status: DC

## 2016-08-23 NOTE — Patient Instructions (Signed)
Medication Instructions:   START PRAVASTATIN 40 MG ONCE DAILY  Labwork:  Your physician recommends that you return for lab work in: [place] PRAVASTATIN= DO NOT EAT PRIOR TO LAB WORK  Follow-Up:  Your physician wants you to follow-up in: [place] will receive a reminder letter in the mail two months in advance. If you don't receive a letter, please call our office to schedule the follow-up appointment.   If you need a refill on your cardiac medications before your next appointment, please call your pharmacy.

## 2016-09-16 ENCOUNTER — Other Ambulatory Visit: Payer: Self-pay

## 2016-09-16 MED ORDER — LOSARTAN POTASSIUM 50 MG PO TABS: 50.0000 mg | ORAL_TABLET | Freq: Every day | ORAL | 3 refills | Status: DC

## 2016-09-29 ENCOUNTER — Encounter: Payer: Self-pay | Admitting: Cardiology

## 2016-09-29 ENCOUNTER — Encounter: Payer: Self-pay | Admitting: Endocrinology

## 2016-10-18 ENCOUNTER — Encounter: Payer: Self-pay | Admitting: *Deleted

## 2016-10-19 ENCOUNTER — Telehealth: Payer: Self-pay | Admitting: Cardiology

## 2016-10-19 NOTE — Telephone Encounter (Signed)
New message  BP  1. 185/90, 127/68, 170/78 2. Some dizziness, general not feeling well, pain in left arm 3. Called 911 Sunday night, EMS determined pain in arm was muscular

## 2016-10-19 NOTE — Telephone Encounter (Signed)
paov Debbie Wells  

## 2016-10-19 NOTE — Telephone Encounter (Signed)
Patient said that on Sunday she had pain in her left arm that  radiated into her neck rated 6/10. Patient said that the pain got worse that night and she contacted 911and was assessed by the paramedics. Whom took her BP which was 189/90 and also had an EKG which was also normal. Patient said that she was feeling stressed about Christmas. Patient said her BP was 127/68 this morning and now its 170/78. Patient described a feeling of not being able to think clearly or properly. No c/o chest pain, dizziness or sob. Patient c/o left arm pain now and for the past 2 weeks. Patient is using tylenol and said the pain is relieved after taking tylenol. Patient took aspirin 325 mg last night when her bp spiked up. Patient is taking hctz 12.5 mg daily, losartan 50 mg daily, and increased her metoprolol succinate to 25 mg daily instead of 12.5 mg daily for the past 5 days. Patient c/o some nausea stating that "I have nausea when my bp gets high." No c/o fever, chills, sweating or swelling. Patient advised to take her BP daily using the same cuff, same arm and at the same time each day. Patient informed that message would be sent to her provider and if her symptoms get worse or if she develops new symptoms that she needed to to go to the ED for an evaluation. Patient is requesting to be seen this week by her doctor. Patient verbalized understanding of plan.

## 2016-10-20 ENCOUNTER — Encounter: Payer: Self-pay | Admitting: Student

## 2016-10-20 ENCOUNTER — Ambulatory Visit (INDEPENDENT_AMBULATORY_CARE_PROVIDER_SITE_OTHER): Payer: Medicare Other | Admitting: Student

## 2016-10-20 VITALS — BP 140/72 | HR 55 | Ht 64.5 in | Wt 155.8 lb

## 2016-10-20 DIAGNOSIS — I10 Essential (primary) hypertension: Secondary | ICD-10-CM

## 2016-10-20 DIAGNOSIS — E785 Hyperlipidemia, unspecified: Secondary | ICD-10-CM

## 2016-10-20 DIAGNOSIS — E039 Hypothyroidism, unspecified: Secondary | ICD-10-CM

## 2016-10-20 DIAGNOSIS — M79602 Pain in left arm: Secondary | ICD-10-CM | POA: Diagnosis not present

## 2016-10-20 MED ORDER — LOSARTAN POTASSIUM 50 MG PO TABS: 75.0000 mg | ORAL_TABLET | Freq: Every day | ORAL | 11 refills | Status: DC

## 2016-10-20 MED ORDER — PRAVASTATIN SODIUM 20 MG PO TABS: 20.0000 mg | ORAL_TABLET | Freq: Every evening | ORAL | Status: DC

## 2016-10-20 NOTE — Progress Notes (Signed)
Cardiology Office Note    Date:  10/20/2016   ID:  TREASURE DREYER, DOB [birth year], MRN [id number]  PCP:  Sheela Stack, MD  Cardiologist: Dr. Stanford Breed  Chief Complaint  Patient presents with  . Follow-up    pt states no chest pain but have a lot of left arm pain, states the pain her arm was so bad on sunday they called 911 they did some ekgs she has them with her. also states her blood pressure was up   . Shortness of Breath    no SOB   . Edema    no     History of Present Illness:    KAYE BLATNICK is a 75 y.o. female with past medical history of HLD, hypothyroidism, and difficult to control HTN who presents to the office today for evaluation of HTN and left arm pain.   Was last seen by Dr. Stanford Breed on 08/23/2016 and reported doing well from a cardiac perspective. 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 Was seen by Vascular due to concern of mesenteric ischemia or celiac artery stenosis but her symptoms were thought to be atypical for this etiology. BP was 136/76 at the time of her visit. Was continued on HCTZ 12.5mg  daily, Losartan 50mg  daily, and Toprol-XL 12.5mg  daily. Notes mention she has been intolerant to ACE-I secondary to cough and developed bradycardia with higher doses of her BB in the past.   She reports this past Sunday night, she developed a left arm pain. The pain was present when she went to bed and she awoke in the middle the night with the pain still there. This was mostly localized to her left elbow, but did radiate up to her shoulder and the left side of her neck. The pain persisted for several hours and her BP was elevated with SBP readings in the 180's and due to this she called EMS. She brings with her the rhythm strips they obtained which are without any acute ischemic changes. She did not go to the hospital, as they thought her pain was mostly musculoskeletal in etiology.  The pain has  continued to occur since Sunday, with episodes lasting for 3-4 hours at a time. Pain is relieved with Tylenol. Her shoulder has been very tender to palpation. No association of her pain with exertion. No associated chest discomfort, nausea, vomiting, diaphoresis, or dyspnea with exertion.  Her blood pressure has remained elevated since this episode. She brings a sheet with home readings over the past few days with SBP's in the 130's - Q000111Q and diastolic numbers in the 99991111 to low-90's. She has increased her Toprol-XL from 12.5mg  daily to 25mg  daily with minimal improvement in her BP. This has made her bradycardiac with HR in the high-40's and she has noticed worsening dizziness with this. Also notes fatigue over the past month. Says she has known hypothyroidism and her Synthroid dose was recently adjusted by her Endocrinologist. She quit taking her Pravastatin last month due to worsening myalgias.   Past Medical History:  Diagnosis Date  . Adenomatous colon polyp   . Adenomatous polyp of colon 12/03/2004   34mm  . Anxiety   . Chronic headaches   . Depression   . Diverticulosis   . External hemorrhoids   . Hyperlipidemia   . Hypertension   . Hypothyroidism   . Umbilical hernia     Past Surgical History:  Procedure Laterality Date  . ABDOMINAL  HYSTERECTOMY  1975  . APPENDECTOMY  1975  . COLONOSCOPY  03/03/2010,12/03/2004  . ESOPHAGOGASTRODUODENOSCOPY  12/16/11   Dr. Silvano Rusk  . OOPHORECTOMY    . spinal lipoma removed  2006  . TOTAL ABDOMINAL HYSTERECTOMY      Current Medications: Outpatient Medications Prior to Visit  Medication Sig Dispense Refill  . ALPRAZolam (XANAX) 0.5 MG tablet Take 0.25-0.5 mg by mouth 2 (two) times daily. Pt takes a full tab at night and half a tab during day    . aspirin 81 MG tablet Take 81 mg by mouth daily.     . bacitracin ophthalmic ointment Place 1 application into both eyes as needed. Dry eyes  0  . Biotin 1000 MCG tablet Take 1,000 mcg by mouth  daily.    Marland Kitchen escitalopram (LEXAPRO) 10 MG tablet Take 10 mg by mouth daily.  5  . estrogens, conjugated, (PREMARIN) 0.45 MG tablet Take 0.45 mg by mouth daily.     . hydrochlorothiazide (MICROZIDE) 12.5 MG capsule Take 1 capsule (12.5 mg total) by mouth daily. 90 capsule 3  . levothyroxine (SYNTHROID, LEVOTHROID) 125 MCG tablet Take 125 mcg by mouth daily.    Marland Kitchen LOTEMAX 0.5 % ophthalmic suspension Place 1 drop into both eyes daily. As needed. For dry eyes    . metoprolol succinate (TOPROL-XL) 25 MG 24 hr tablet Take 25 mg by mouth daily.     . Multiple Vitamin (MULTIVITAMIN) capsule Take 1 capsule by mouth daily.    . Multiple Vitamins-Minerals (HAIR SKIN AND NAILS FORMULA PO) Take 1 capsule by mouth daily.    . Omega-3 Fatty Acids (FISH OIL PO) Take 1 tablet by mouth daily.    . traZODone (DESYREL) 100 MG tablet Take 100 mg by mouth at bedtime.   0  . losartan (COZAAR) 50 MG tablet Take 1 tablet (50 mg total) by mouth daily. 90 tablet 3  . pravastatin (PRAVACHOL) 40 MG tablet Take 1 tablet (40 mg total) by mouth every evening. 90 tablet 3  . Linaclotide (LINZESS) 145 MCG CAPS capsule Take 145 mcg by mouth daily as needed (consitpation).      No facility-administered medications prior to visit.      Allergies:   Iodinated diagnostic agents; Benazepril; Hydrocodone-acetaminophen; and Sulfonamide derivatives   Social History   Social History  . Marital status: Married    Spouse name: N/A  . Number of children: 2  . Years of education: N/A   Occupational History  . retired    Social History Main Topics  . Smoking status: Former Smoker    Types: Cigarettes    Quit date: 01/23/1993  . Smokeless tobacco: Never Used  . Alcohol use 1.2 oz/week    2 Glasses of wine per week  . Drug use: No  . Sexual activity: Not Asked   Other Topics Concern  . None   Social History Narrative  . None     Family History:  The patient's family history includes Addison's disease in her mother;  Esophageal cancer in her father; Heart disease in her mother; Heart failure in her mother; Hypertension in her maternal grandmother and mother; Irritable bowel syndrome in her mother; Kidney disease in her mother; Other in her mother; Stomach cancer in her father; Stroke in her maternal grandmother and mother.   Review of Systems:   Please see the history of present illness.     General:  No chills, fever, night sweats or weight changes. Positive for generalized fatigue. Cardiovascular:  No chest pain, dyspnea on exertion, edema, orthopnea, palpitations, paroxysmal nocturnal dyspnea. Dermatological: No rash, lesions/masses Respiratory: No cough, dyspnea Urologic: No hematuria, dysuria Abdominal:   No nausea, vomiting, diarrhea, bright red blood per rectum, melena, or hematemesis Neurologic:  No visual changes, wkns, changes in mental status. MSK: Positive for left arm and elbow pain.  All other systems reviewed and are otherwise negative except as noted above.   Physical Exam:    VS:  BP 140/72   Pulse (!) 55   Ht 5' 4.5" (1.638 m)   Wt 155 lb 12.8 oz (70.7 kg)   SpO2 97%   BMI 26.33 kg/m    General: Well developed, well nourished Caucasian female appearing in no acute distress. Head: Normocephalic, atraumatic, sclera non-icteric, no xanthomas, nares are without discharge.  Neck: No carotid bruits. JVD not elevated.  Lungs: Respirations regular and unlabored, without wheezes or rales.  Heart: Regular rhythm, bradycardiac rate. No S3 or S4.  No murmur, no rubs, or gallops appreciated. Abdomen: Soft, non-tender, non-distended with normoactive bowel sounds. No hepatomegaly. No rebound/guarding. No obvious abdominal masses. Msk:  Strength and tone appear normal for age. No joint deformities or effusions. Extremities: No clubbing or cyanosis. No edema.  Distal pedal pulses are 2+ bilaterally. Left elbow and shoulder are tender to palpation.  Neuro: Alert and oriented X 3. Moves all  extremities spontaneously. No focal deficits noted. 5/5 strength along upper extremities bilaterally. Psych:  Responds to questions appropriately with a normal affect. Skin: No rashes or lesions noted  Wt Readings from Last 3 Encounters:  10/20/16 155 lb 12.8 oz (70.7 kg)  08/23/16 156 lb 9.6 oz (71 kg)  02/16/16 155 lb (70.3 kg)     Studies/Labs Reviewed:   EKG:  EKG is ordered today.  The ekg ordered today demonstrates sinus bradycardia, HR 57, with TWI in V1 and V2 which is similar to prior tracings.   Recent Labs: No results found for requested labs within last 8760 hours.   Lipid Panel No results found for: CHOL, TRIG, HDL, CHOLHDL, VLDL, LDLCALC, LDLDIRECT  Additional studies/ records that were reviewed today include:   Echocardiogram: 12/2012 Study Conclusions  - Left ventricle: The cavity size was normal. Wall thickness was normal. Systolic function was normal. The estimated ejection fraction was in the range of 60% to 65%. Wall motion was normal; there were no regional wall motion abnormalities. Doppler parameters are consistent with abnormal left ventricular relaxation (grade 1 diastolic dysfunction). - Atrial septum: No defect or patent foramen ovale was identified.  NST: 12/2012: No evidence of ischemia or prior infarct. LV function normal.   Assessment:    1. Left arm pain   2. Essential hypertension   3. Hyperlipidemia, unspecified hyperlipidemia type   4. Hypothyroidism, unspecified type      Plan:   In order of problems listed above:  1. Left Arm Pain - developed acute left arm pain this past Sunday night which lasted for several hours. Called EMS and was evaluated but not taken to the hospital due to them thinking her pain was of MSK etiology.  - reports recurrent episodes of pain radiating from her left elbow to her shoulder and left jaw. This pain lasts for several hours at a time and is relieved with Tylenol. No associated chest  discomfort, nausea, vomiting, diaphoresis, or dyspnea with exertion. On exam, her left elbow and shoulder are tender to palpation which makes a MSK/Neurological etiology most likely. A repeat EKG is obtained today  and in reassuring, showing no acute ischemic changes when compared to prior tracings.   2. HTN - the patient reports SBP's in the 130's - Q000111Q and diastolic numbers in the 99991111 to low-90's over the past few weeks. She self-increased her Toprol-XL from 12.5mg  daily to 25mg  daily with minimal improvement in her BP and has developed worsening dizziness. HR has varied from the high-40's to 50's. BP at 140/72 during today's visit. - Advised her to decrease Toprol-XL back to 12.5mg  daily with her bradycardia. Will continue HCTZ 12.5mg  daily and increase Losartan from 50mg  daily to 75mg  daily. She will check her BP once daily and report back with her readings.   3. HLD - reports being intolerant to Crestor and Lipitor in the past. Was started on Pravastatin 40mg  daily at her last office visit. Quit taking this within one month due to myalgias. Recommended she try cutting the dose in half and taking 20mg  daily to see if she can tolerate this. She also expressed interest in trying Lipitor 10mg  daily again, for she "tolerated this well for years". Will initially try Pravastatin dose reduction and she will let us know if unable to tolerate this. Will need a repeat Lipid Panel and LFT's in 6-8 weeks.   4. Hypothyroidism - Remains on Synthroid. Followed by her Endocrinologist.    Medication Adjustments/Labs and Tests Ordered: Current medicines are reviewed at length with the patient today.  Concerns regarding medicines are outlined above.  Medication changes, Labs and Tests ordered today are listed in the Patient Instructions below. Patient Instructions  Medication Instructions:  Your physician has recommended you make the following change in your medication:  1) INCREASE Losartan to 75mg  (1 and 1/2  tablet) daily. 2) REDUCE Pravastatin to 20 mg daily.  Labwork: None ordered  Testing/Procedures: None ordered  Follow-Up: Your physician wants you to follow-up in: April 2018 with Dr.Crenshaw You will receive a reminder letter in the mail two months in advance. If you don't receive a letter, please call our office to schedule the follow-up appointment.  Any Other Special Instructions Will Be Listed Below (If Applicable).  If you need a refill on your cardiac medications before your next appointment, please call your pharmacy.   Signed, Erma Heritage, PA  10/20/2016 10:10 PM    [facility] [place], [place], [place]  [address] Phone: [phone]; Fax: [phone]  [address] [place], [place] [address] Phone: [phone]

## 2016-10-20 NOTE — Patient Instructions (Signed)
Medication Instructions:  Your physician has recommended you make the following change in your medication:  1) INCREASE Losartan to 75mg  (1 and 1/2 tablet) daily. 2) REDUCE Pravastatin to 20 mg daily.   Labwork: None ordered  Testing/Procedures: None ordered  Follow-Up: Your physician wants you to follow-up in: April 2018 with Dr.Crenshaw You will receive a reminder letter in the mail two months in advance. If you don't receive a letter, please call our office to schedule the follow-up appointment.   Any Other Special Instructions Will Be Listed Below (If Applicable).     If you need a refill on your cardiac medications before your next appointment, please call your pharmacy.

## 2016-10-20 NOTE — Telephone Encounter (Signed)
Spoke with pt, Aware of dr crenshaw's recommendations.  Follow up scheduled  

## 2017-01-31 ENCOUNTER — Telehealth: Payer: Self-pay | Admitting: *Deleted

## 2017-01-31 DIAGNOSIS — E785 Hyperlipidemia, unspecified: Secondary | ICD-10-CM

## 2017-01-31 MED ORDER — ATORVASTATIN CALCIUM 10 MG PO TABS: 10.0000 mg | ORAL_TABLET | Freq: Every day | ORAL | 3 refills | Status: DC

## 2017-01-31 NOTE — Telephone Encounter (Signed)
Ok to [facility] and treat with lipitor 10 mg daily; lipids and liver 4 weeks Kirk Ruths

## 2017-01-31 NOTE — Telephone Encounter (Signed)
Patients husband, Gwyndolyn Saxon left a msg on the refill vm stating that the patient is having cramping and muscular aches with the pravastatin. Patient would like to go back on the atorvastatin as the side effects seemed more tolerable. Call back number left was [phone].

## 2017-01-31 NOTE — Telephone Encounter (Signed)
Spoke with pt husband, the patient was able to tolerate lipitor 10 mg in the past and she would like to go back to that dose. She is having trouble with the pravastatin. Will forward to dr Stanford Breed to okay the change to lipitor 10 mg daily.

## 2017-01-31 NOTE — Telephone Encounter (Signed)
New script sent to the pharmacy, patient husband aware of the need for blood work after the patient has taken lipitor for 4 weeks.

## 2017-02-08 NOTE — Progress Notes (Signed)
HPI: FU hypertension. She has a history of hypertension which has been somewhat difficult to control.She had previously worn an outpatient 24-hour blood pressure monitor which showed wide swings of blood pressure.She has had a 24-hour urine collection for metanephrines which was normal. She has had blood work in Dr. Forde Dandy office which has ruled out Cushing's syndrome.Her cardiac workup has included a Lexiscan Myoview stress test on 12/07/12 showing no evidence of ischemia and her ejection fraction was 60% and there were no wall motion abnormalities. She had a previous echocardiogram 09/17/11 showing normal LV function and mild mitral regurgitation. Renal dopplers 2014 showed mild 1-59 right RAS. She has had problems with chronic steady pain in the left epigastrium and left upper quadrant and has seen her gastroenterologist Dr. Carlean Purl. She has seen Dr. Mamie Nick regarding question of mesenteric and or celiac artery stenosis as a possible cause of her left upper quadrant pain. Dr. Kellie Simmering did not feel that the quality of her pain suggested mesenteric ischemia. She has also been seen by vascular surgery at [facility]. They did not feel that she would benefit from surgery. Previous cough with ACEI. Bradycardia with higher doses of beta blocker. Patient had lifeline screening recently and her carotids were normal, no aneurysm and normal ABIs. Since last seen, her blood pressure has been running high in the evenings. She states occasionally 160-165. There is no dyspnea, chest pain, palpitations or syncope.  Current Outpatient Prescriptions  Medication Sig Dispense Refill  . ALPRAZolam (XANAX) 0.5 MG tablet Take 0.25-0.5 mg by mouth 2 (two) times daily. Pt takes a full tab at night and half a tab during day    . aspirin 81 MG tablet Take 81 mg by mouth daily.     Marland Kitchen atorvastatin (LIPITOR) 10 MG tablet Take 1 tablet (10 mg total) by mouth daily. 90 tablet 3  . bacitracin ophthalmic ointment Place  1 application into both eyes as needed. Dry eyes  0  . Biotin 1000 MCG tablet Take 1,000 mcg by mouth daily.    Marland Kitchen escitalopram (LEXAPRO) 10 MG tablet Take 10 mg by mouth daily.  5  . estrogens, conjugated, (PREMARIN) 0.45 MG tablet Take 0.45 mg by mouth daily.     . hydrochlorothiazide (MICROZIDE) 12.5 MG capsule Take 1 capsule (12.5 mg total) by mouth daily. 90 capsule 3  . levothyroxine (SYNTHROID, LEVOTHROID) 125 MCG tablet Take 125 mcg by mouth daily.    Marland Kitchen losartan (COZAAR) 50 MG tablet Take 1.5 tablets (75 mg total) by mouth daily. 45 tablet 11  . LOTEMAX 0.5 % ophthalmic suspension Place 1 drop into both eyes daily. As needed. For dry eyes    . metoprolol succinate (TOPROL-XL) 25 MG 24 hr tablet Take 12.5 mg by mouth daily.     . Multiple Vitamin (MULTIVITAMIN) capsule Take 1 capsule by mouth daily.    . Multiple Vitamins-Minerals (HAIR SKIN AND NAILS FORMULA PO) Take 1 capsule by mouth daily.    . Omega-3 Fatty Acids (FISH OIL PO) Take 1 tablet by mouth daily.    . traZODone (DESYREL) 100 MG tablet Take 100 mg by mouth at bedtime.   0   No current facility-administered medications for this visit.      Past Medical History:  Diagnosis Date  . Adenomatous colon polyp   . Adenomatous polyp of colon 12/03/2004   62mm  . Anxiety   . Chronic headaches   . Depression   . Diverticulosis   .  External hemorrhoids   . Hyperlipidemia   . Hypertension   . Hypothyroidism   . Umbilical hernia     Past Surgical History:  Procedure Laterality Date  . ABDOMINAL HYSTERECTOMY  1975  . APPENDECTOMY  1975  . COLONOSCOPY  03/03/2010,12/03/2004  . ESOPHAGOGASTRODUODENOSCOPY  12/16/11   Dr. Silvano Rusk  . OOPHORECTOMY    . spinal lipoma removed  2006  . TOTAL ABDOMINAL HYSTERECTOMY      Social History   Social History  . Marital status: Married    Spouse name: N/A  . Number of children: 2  . Years of education: N/A   Occupational History  . retired    Social History Main Topics  .  Smoking status: Former Smoker    Types: Cigarettes    Quit date: 01/23/1993  . Smokeless tobacco: Never Used  . Alcohol use 1.2 oz/week    2 Glasses of wine per week  . Drug use: No  . Sexual activity: Not on file   Other Topics Concern  . Not on file   Social History Narrative  . No narrative on file    Family History  Problem Relation Age of Onset  . Esophageal cancer Father   . Stomach cancer Father     mets from esophagus  . Heart disease Mother   . Irritable bowel syndrome Mother   . Kidney disease Mother   . Other Mother     cushings disease  . Addison's disease Mother   . Heart failure Mother   . Stroke Mother   . Hypertension Mother   . Stroke Maternal Grandmother   . Hypertension Maternal Grandmother   . Colon cancer Neg Hx   . Heart attack Neg Hx     ROS: abdominal pain but no fevers or chills, productive cough, hemoptysis, dysphasia, odynophagia, melena, hematochezia, dysuria, hematuria, rash, seizure activity, orthopnea, PND, pedal edema, claudication. Remaining systems are negative.  Physical Exam: Well-developed well-nourished in no acute distress.  Skin is warm and dry.  HEENT is normal.  Neck is supple. No bruits Chest is clear to auscultation with normal expansion.  Cardiovascular exam is regular rate and rhythm. No murmur Abdominal exam non distended. No masses palpated. Tender left upper epigastric area.  Extremities show no edema. neuro grossly intact  A/P  1 Hypertension-blood pressure mildly elevated at home. Increase Cozaar to 100 mg daily. Check potassium and renal function in 1 week. Follow blood pressure and adjust regimen as needed.  2 hyperlipidemia-continue statin.  3 chronic upper abdominal pain-follow-up primary care.  Kirk Ruths, MD

## 2017-02-22 ENCOUNTER — Ambulatory Visit (INDEPENDENT_AMBULATORY_CARE_PROVIDER_SITE_OTHER): Payer: Medicare Other | Admitting: Cardiology

## 2017-02-22 ENCOUNTER — Encounter: Payer: Self-pay | Admitting: Cardiology

## 2017-02-22 VITALS — BP 131/66 | HR 58 | Ht 64.5 in | Wt 152.4 lb

## 2017-02-22 DIAGNOSIS — E7800 Pure hypercholesterolemia, unspecified: Secondary | ICD-10-CM

## 2017-02-22 DIAGNOSIS — I10 Essential (primary) hypertension: Secondary | ICD-10-CM | POA: Diagnosis not present

## 2017-02-22 MED ORDER — LOSARTAN POTASSIUM 100 MG PO TABS: 100.0000 mg | ORAL_TABLET | Freq: Every day | ORAL | 3 refills | Status: DC

## 2017-02-22 NOTE — Patient Instructions (Signed)
Medication Instructions:   INCREASE LOSARTAN TO 100 MG ONCE DAILY= 2 OF THE 50 MG TABLETS ONCE DAILY  Labwork:  Your physician recommends that you return for lab work in: ONE WEEK= [facility]  Follow-Up:  Your physician wants you to follow-up in: [facility] will receive a reminder letter in the mail two months in advance. If you don't receive a letter, please call our office to schedule the follow-up appointment.   If you need a refill on your cardiac medications before your next appointment, please call your pharmacy.

## 2017-02-25 ENCOUNTER — Telehealth: Payer: Self-pay | Admitting: Cardiology

## 2017-02-25 DIAGNOSIS — I10 Essential (primary) hypertension: Secondary | ICD-10-CM

## 2017-02-25 MED ORDER — LOSARTAN POTASSIUM 50 MG PO TABS: 100.0000 mg | ORAL_TABLET | Freq: Every day | ORAL | 3 refills | Status: DC

## 2017-02-25 NOTE — Telephone Encounter (Signed)
Returned the phone call to the patient. She stated that instead of taking a 100 mg tablet once daily she would rather take 2- 50 mg tablets daily. The new prescription has been called in and [facility] has been notified of the change. The patient verbalized her understanding and appreciation.

## 2017-02-25 NOTE — Telephone Encounter (Signed)
New message    *STAT* If patient is at the pharmacy, call can be transferred to refill team.   1. Which medications need to be refilled? (please list name of each medication and dose if known) losartan (COZAAR) 100 MG tablet  2. Which pharmacy/location (including street and city if local pharmacy) is medication to be sent to? [facility]  3. Do they need a 30 day or 90 day supply? 90 day supply   Pt husband states it was sent in for 100MG  1 time daily. He states she was to back to the 50MG  tablets so she takes it TWO times daily. Would like it resent to the pharmacy for same Rx different MG and for 90 day supply (states pt normally gets a 90 day supply but this time it was sent for 30 day supply).

## 2017-03-02 ENCOUNTER — Telehealth: Payer: Self-pay | Admitting: Cardiology

## 2017-03-02 MED ORDER — METOPROLOL SUCCINATE ER 25 MG PO TB24: 12.5000 mg | ORAL_TABLET | Freq: Two times a day (BID) | ORAL | Status: DC

## 2017-03-02 NOTE — Telephone Encounter (Signed)
Spoke with pt, Aware of dr crenshaw's recommendations.  °

## 2017-03-02 NOTE — Telephone Encounter (Signed)
Spoke with pt, she was seen a couple weeks ago and her losartan was increased. In the past we had decreased the metoprolol to 12.5 mg due to bradycardia. She now reports her heart rate being higher than it has ever been and she is also hearing her heart throb in her ears at night. She would like to know from dr Stanford Breed if she can take 12.5 mg of metoprolol twice daily to see if that helps. She denies other symptoms. Will forward for dr Stanford Breed review

## 2017-03-02 NOTE — Telephone Encounter (Signed)
New message      Pt c/o BP issue: STAT if pt c/o blurred vision, one-sided weakness or slurred speech  1. What are your last 5 BP readings? At 3am 128/76 HR 85 2. Are you having any other symptoms (ex. Dizziness, headache, blurred vision, passed out)?  no  3. What is your BP issue?  Pt states that she is hearing her heart beat in her ear worse at night.  Pt states that her HR is never this high.  Please advise

## 2017-03-02 NOTE — Telephone Encounter (Signed)
Ok to take metoprolol 12.5 BID Debbie Wells

## 2017-03-03 DIAGNOSIS — H9312 Tinnitus, left ear: Secondary | ICD-10-CM | POA: Insufficient documentation

## 2017-03-03 LAB — BASIC METABOLIC PANEL
BUN / CREAT RATIO: 11 — AB (ref 12–28)
BUN: 11 mg/dL (ref 8–27)
CO2: 24 mmol/L (ref 18–29)
CREATININE: 0.99 mg/dL (ref 0.57–1.00)
Calcium: 9.7 mg/dL (ref 8.7–10.3)
Chloride: 92 mmol/L — ABNORMAL LOW (ref 96–106)
GFR calc Af Amer: 64 mL/min/{1.73_m2} (ref >59)
GFR, EST NON AFRICAN AMERICAN: 56 mL/min/{1.73_m2} — AB (ref >59)
GLUCOSE: 102 mg/dL — AB (ref 65–99)
POTASSIUM: 4.2 mmol/L (ref 3.5–5.2)
SODIUM: 131 mmol/L — AB (ref 134–144)

## 2017-03-11 ENCOUNTER — Telehealth: Payer: Self-pay | Admitting: Cardiology

## 2017-03-11 NOTE — Telephone Encounter (Signed)
Spoke with pt, advised not to take the HCTZ or losartan today. Advised her to take the metoprolol tonight at bedtime. She reports diarrhea for one week now. She will hold the HCTZ and losartan until she feels better and her bp returns to normal. She will continue the metoprolol at bedtime and she will call if continues to have problems. Pt agreed with this plan.

## 2017-03-11 NOTE — Telephone Encounter (Signed)
New message      Pt c/o BP issue: STAT if pt c/o blurred vision, one-sided weakness or slurred speech  1. What are your last 5 BP readings?  96/62 yesterday and today  2. Are you having any other symptoms (ex. Dizziness, headache, blurred vision, passed out)?  Weakness, some dizziness when going from a sitting to standing position  3. What is your BP issue?  Should pt still take medications?pt has had a stomach since sunday

## 2017-04-07 ENCOUNTER — Other Ambulatory Visit: Payer: Self-pay | Admitting: Cardiology

## 2017-06-07 ENCOUNTER — Telehealth: Payer: Self-pay | Admitting: Cardiology

## 2017-06-07 NOTE — Telephone Encounter (Signed)
Blood pressure this am 115/67

## 2017-06-07 NOTE — Telephone Encounter (Signed)
Left message to call back  

## 2017-06-07 NOTE — Telephone Encounter (Signed)
Spoke with patient regarding blood pressure She has not been taking her blood pressure at home but just has not been feeling like doing much of anything. Yesterday she took her blood pressure in the morning and her first blood pressure was 98/50's before her medications. She decided not to take her blood pressure medications. Did monitor her blood pressure throughout the day and highest reading was 136/68 HR average 56 without medications. Around 4:00 pm she did take Losartan 50 mg and Toprol 25 mg 1/2 tablet. Usual dose of Losartan is 50 mg 2 tablets daily, Toprol 25 mg 1/2 tablet twice a day, and HCTZ 12.5 daily. Last night blood pressure was 122/63 before bedtime. Patient just unsure what to do with blood pressure medications with current readings.  Will forward to Dr Stanford Breed for review

## 2017-06-07 NOTE — Telephone Encounter (Signed)
Reviewed with husband, ok per DPR Will monitor blood pressure and call back if any further issues

## 2017-06-07 NOTE — Telephone Encounter (Signed)
New message   Pt c/o BP issue: STAT if pt c/o blurred vision, one-sided weakness or slurred speech  1. What are your last 5 BP readings? Yesterday - 102/54 @ 7 am without medication  ,   retake @ noon 128/65 pulse 55  with out medication   retake - took medication @ 3:45 pm  136/68   retake -@ 9:30 pm 122/63 cutting medication in half    today @ 7:30 am115/67 2. Are you having any other symptoms (ex. Dizziness, headache, blurred vision, passed out)? Does not feel like doing anthying    3. What is your BP issue? Want to talk with nurse does the MD agree in what she doing.

## 2017-06-07 NOTE — Telephone Encounter (Signed)
Change cozaar to 50 mg daily; DC hctz; continue toprol 12.5 mg daily; follow BP Kirk Ruths

## 2017-07-04 ENCOUNTER — Other Ambulatory Visit: Payer: Self-pay | Admitting: Cardiology

## 2017-07-05 NOTE — Telephone Encounter (Signed)
REFILL 

## 2017-07-14 ENCOUNTER — Other Ambulatory Visit (HOSPITAL_COMMUNITY)
Admission: RE | Admit: 2017-07-14 | Discharge: 2017-07-14 | Disposition: A | Discharge status: Home / Self Care | Payer: Medicare Other | Source: Ambulatory Visit | Attending: Urology | Admitting: Urology

## 2017-07-14 DIAGNOSIS — N390 Urinary tract infection, site not specified: Secondary | ICD-10-CM | POA: Diagnosis present

## 2017-07-15 LAB — URINE CULTURE: CULTURE: NO GROWTH

## 2017-07-25 ENCOUNTER — Other Ambulatory Visit: Payer: Self-pay | Admitting: *Deleted

## 2017-07-26 MED ORDER — METOPROLOL SUCCINATE ER 25 MG PO TB24: 25.0000 mg | ORAL_TABLET | ORAL | 4 refills | Status: DC

## 2017-07-26 NOTE — Telephone Encounter (Signed)
Rx(s) sent to pharmacy electronically.  

## 2017-08-22 NOTE — Progress Notes (Signed)
HPI: FU hypertension. She has a history of hypertension which has been somewhat difficult to control.She had previously worn an outpatient 24-hour blood pressure monitor which showed wide swings of blood pressure.She has had a 24-hour urine collection for metanephrines which was normal. She has had blood work in Dr. Baldwin Crown office which has ruled out Cushing's syndrome.Her cardiac workup has included a Lexiscan Myoview stress test on 12/07/12 showing no evidence of ischemia and her ejection fraction was 60% and there were no wall motion abnormalities. She had a previous echocardiogram 09/17/11 showing normal LV function and mild mitral regurgitation. Renal dopplers 2014 showed mild 1-59 right RAS. She has had problems with chronic steady pain in the left epigastrium and left upper quadrant and has seen her gastroenterologist Dr. Carlean Purl. She has seen Dr. Mamie Nick regarding question of mesenteric and or celiac artery stenosis as a possible cause of her left upper quadrant pain. Dr. Kellie Simmering did not feel that the quality of her pain suggested mesenteric ischemia. She has also been seen by vascular surgery at [facility]. They did not feel that she would benefit from surgery. Previous cough with ACEI. Bradycardia with higher doses of beta blocker. Patient had lifeline screening recently and her carotids were normal, no aneurysm and normal ABIs. Since last seen, patient complains of feeling jittery inside. There is some fatigue. She denies dyspnea, chest pain, palpitations or syncope.  Current Outpatient Prescriptions  Medication Sig Dispense Refill  . ALPRAZolam (XANAX) 0.5 MG tablet Take 0.25-0.5 mg by mouth 2 (two) times daily. Pt takes a full tab at night and half a tab during day    . aspirin 81 MG tablet Take 81 mg by mouth daily.     . bacitracin ophthalmic ointment Place 1 application into both eyes as needed. Dry eyes  0  . Biotin 1000 MCG tablet Take 1,000 mcg by mouth daily.    Marland Kitchen  escitalopram (LEXAPRO) 10 MG tablet Take 10 mg by mouth daily.  5  . estrogens, conjugated, (PREMARIN) 0.45 MG tablet Take 0.45 mg by mouth daily.     . hydrochlorothiazide (MICROZIDE) 12.5 MG capsule TAKE 1 CAPSULE (12.5 MG TOTAL) BY MOUTH DAILY. 90 capsule 1  . levothyroxine (SYNTHROID, LEVOTHROID) 125 MCG tablet Take 125 mcg by mouth daily.    Marland Kitchen losartan (COZAAR) 50 MG tablet Take 50 mg by mouth 2 (two) times daily.     Marland Kitchen LOTEMAX 0.5 % ophthalmic suspension Place 1 drop into both eyes daily. As needed. For dry eyes    . metoprolol succinate (TOPROL-XL) 25 MG 24 hr tablet Take 1 tablet (25 mg total) by mouth as directed. 1/2 tablet by mouth once daily 30 tablet 4  . Multiple Vitamin (MULTIVITAMIN) capsule Take 1 capsule by mouth daily.    . Multiple Vitamins-Minerals (HAIR SKIN AND NAILS FORMULA PO) Take 1 capsule by mouth daily.    . Omega-3 Fatty Acids (FISH OIL PO) Take 1 tablet by mouth daily.    . traZODone (DESYREL) 100 MG tablet Take 100 mg by mouth at bedtime.   0  . atorvastatin (LIPITOR) 10 MG tablet Take 1 tablet (10 mg total) by mouth daily. 90 tablet 3   No current facility-administered medications for this visit.      Past Medical History:  Diagnosis Date  . Adenomatous colon polyp   . Adenomatous polyp of colon 12/03/2004   32mm  . Anxiety   . Chronic headaches   . Depression   .  Diverticulosis   . External hemorrhoids   . Hyperlipidemia   . Hypertension   . Hypothyroidism   . Umbilical hernia     Past Surgical History:  Procedure Laterality Date  . ABDOMINAL HYSTERECTOMY  1975  . APPENDECTOMY  1975  . COLONOSCOPY  03/03/2010,12/03/2004  . ESOPHAGOGASTRODUODENOSCOPY  12/16/11   Dr. Silvano Rusk  . OOPHORECTOMY    . spinal lipoma removed  2006  . TOTAL ABDOMINAL HYSTERECTOMY      Social History   Social History  . Marital status: Married    Spouse name: N/A  . Number of children: 2  . Years of education: N/A   Occupational History  . retired    Social  History Main Topics  . Smoking status: Former Smoker    Types: Cigarettes    Quit date: 01/23/1993  . Smokeless tobacco: Never Used  . Alcohol use 1.2 oz/week    2 Glasses of wine per week  . Drug use: No  . Sexual activity: Not on file   Other Topics Concern  . Not on file   Social History Narrative  . No narrative on file    Family History  Problem Relation Age of Onset  . Esophageal cancer Father   . Stomach cancer Father        mets from esophagus  . Heart disease Mother   . Irritable bowel syndrome Mother   . Kidney disease Mother   . Other Mother        cushings disease  . Addison's disease Mother   . Heart failure Mother   . Stroke Mother   . Hypertension Mother   . Stroke Maternal Grandmother   . Hypertension Maternal Grandmother   . Colon cancer Neg Hx   . Heart attack Neg Hx     ROS: Fatigue and jitteriness but no fevers or chills, productive cough, hemoptysis, dysphasia, odynophagia, melena, hematochezia, dysuria, hematuria, rash, seizure activity, orthopnea, PND, pedal edema, claudication. Remaining systems are negative.  Physical Exam: Well-developed well-nourished in no acute distress.  Skin is warm and dry.  HEENT is normal.  Neck is supple.  Chest is clear to auscultation with normal expansion.  Cardiovascular exam is regular rate and rhythm.  Abdominal exam nontender or distended. No masses palpated. Extremities show no edema. neuro grossly intact  ECG- Sinus rhythm at a rate of 56. No ST changes. personally reviewed  A/P  1 Hypertension-patient continues to have labile blood pressure. She states it is elevated at night. Her husband states that she is very sensitive to all medications. She recently increased her Cozaar to 100 mg daily and her blood pressure appears to be trending down. I've asked her to continue following her blood pressure at home and we may add low-dose amlodipine if blood pressure remains elevated. Her husband also feels there  may be a component of panic contributing to her labile pressure.  2 hyperlipidemia-continue statin.  3 chronic abdominal pain-management per primary care.  Kirk Ruths, MD

## 2017-08-30 ENCOUNTER — Ambulatory Visit (INDEPENDENT_AMBULATORY_CARE_PROVIDER_SITE_OTHER): Payer: Medicare Other | Admitting: Cardiology

## 2017-08-30 ENCOUNTER — Encounter: Payer: Self-pay | Admitting: Cardiology

## 2017-08-30 VITALS — BP 140/60 | HR 56 | Ht 64.5 in | Wt 156.8 lb

## 2017-08-30 DIAGNOSIS — E7800 Pure hypercholesterolemia, unspecified: Secondary | ICD-10-CM

## 2017-08-30 DIAGNOSIS — I10 Essential (primary) hypertension: Secondary | ICD-10-CM

## 2017-08-30 NOTE — Patient Instructions (Signed)
Your physician wants you to follow-up in: 6 MONTHS WITH DR CRENSHAW You will receive a reminder letter in the mail two months in advance. If you don't receive a letter, please call our office to schedule the follow-up appointment.   If you need a refill on your cardiac medications before your next appointment, please call your pharmacy.  

## 2017-12-14 DIAGNOSIS — H9202 Otalgia, left ear: Secondary | ICD-10-CM | POA: Insufficient documentation

## 2017-12-14 DIAGNOSIS — J341 Cyst and mucocele of nose and nasal sinus: Secondary | ICD-10-CM | POA: Insufficient documentation

## 2017-12-15 ENCOUNTER — Ambulatory Visit: Payer: Medicare Other

## 2017-12-20 ENCOUNTER — Ambulatory Visit: Payer: Medicare Other | Admitting: Physician Assistant

## 2018-01-17 MED FILL — SHINGRIX 50 MCG SUS: 50 | 1 days supply | Qty: 1 | Fill #0

## 2018-01-22 ENCOUNTER — Other Ambulatory Visit: Payer: Self-pay | Admitting: Cardiology

## 2018-01-22 DIAGNOSIS — E785 Hyperlipidemia, unspecified: Secondary | ICD-10-CM

## 2018-03-22 NOTE — Progress Notes (Signed)
HPI: FU hypertension. She has a history of hypertension which has been somewhat difficult to control.She had previously worn an outpatient 24-hour blood pressure monitor which showed wide swings of blood pressure.She has had a 24-hour urine collection for metanephrines which was normal. She has had blood work in Dr. Baldwin Crown office which has ruled out Cushing's syndrome.Her cardiac workup has included a Lexiscan Myoview stress test on 12/07/12 showing no evidence of ischemia and her ejection fraction was 60% and there were no wall motion abnormalities. She had a previous echocardiogram 09/17/11 showing normal LV function and mild mitral regurgitation. Renal dopplers 2014 showed mild 1-59 right RAS. She has had problems with chronic steady pain in the left epigastrium and left upper quadrant and has seen her gastroenterologist Dr. Carlean Purl. She has seen Dr. Mamie Nick regarding question of mesenteric and or celiac artery stenosis as a possible cause of her left upper quadrant pain. Dr. Kellie Simmering did not feel that the quality of her pain suggested mesenteric ischemia. She has also been seen by vascular surgery at [facility]. They did not feel that she would benefit from surgery. Previous cough with ACEI. Bradycardia with higher doses of beta blocker. Patient had lifeline screening previously and her carotids were normal, no aneurysm and normal ABIs.Since last seen,  she denies dyspnea, chest pain or syncope.  Current Outpatient Medications  Medication Sig Dispense Refill  . ALPRAZolam (XANAX) 0.5 MG tablet Take 0.25-0.5 mg by mouth 2 (two) times daily. Pt takes a full tab at night and half a tab during day    . aspirin 81 MG tablet Take 81 mg by mouth daily.     Marland Kitchen atorvastatin (LIPITOR) 10 MG tablet TAKE 1 TABLET BY MOUTH EVERY DAY 90 tablet 2  . bacitracin ophthalmic ointment Place 1 application into both eyes as needed. Dry eyes  0  . escitalopram (LEXAPRO) 10 MG tablet Take 10 mg by mouth  daily.  5  . estrogens, conjugated, (PREMARIN) 0.45 MG tablet Take 0.45 mg by mouth daily.     . hydrochlorothiazide (MICROZIDE) 12.5 MG capsule TAKE 1 CAPSULE (12.5 MG TOTAL) BY MOUTH DAILY. 90 capsule 1  . losartan (COZAAR) 50 MG tablet Take by mouth daily. 2 tablets daily    . LOTEMAX 0.5 % ophthalmic suspension Place 1 drop into both eyes daily. As needed. For dry eyes    . metoprolol succinate (TOPROL-XL) 25 MG 24 hr tablet Take 1 tablet (25 mg total) by mouth as directed. 1/2 tablet by mouth once daily 30 tablet 4  . Multiple Vitamin (MULTIVITAMIN) capsule Take 1 capsule by mouth daily.    . Multiple Vitamins-Minerals (HAIR SKIN AND NAILS FORMULA PO) Take 1 capsule by mouth daily.    . Omega-3 Fatty Acids (FISH OIL PO) Take 1 tablet by mouth daily.    Marland Kitchen SYNTHROID 112 MCG tablet Take 112 mcg by mouth daily.  1  . traZODone (DESYREL) 100 MG tablet Take 100 mg by mouth at bedtime.   0   No current facility-administered medications for this visit.      Past Medical History:  Diagnosis Date  . Adenomatous colon polyp   . Adenomatous polyp of colon 12/03/2004   28mm  . Anxiety   . Chronic headaches   . Depression   . Diverticulosis   . External hemorrhoids   . Hyperlipidemia   . Hypertension   . Hypothyroidism   . Umbilical hernia     Past Surgical History:  Procedure Laterality Date  . ABDOMINAL HYSTERECTOMY  1975  . APPENDECTOMY  1975  . COLONOSCOPY  03/03/2010,12/03/2004  . ESOPHAGOGASTRODUODENOSCOPY  12/16/11   Dr. Silvano Rusk  . OOPHORECTOMY    . spinal lipoma removed  2006  . TOTAL ABDOMINAL HYSTERECTOMY      Social History   Socioeconomic History  . Marital status: Married    Spouse name: Not on file  . Number of children: 2  . Years of education: Not on file  . Highest education level: Not on file  Occupational History  . Occupation: retired  Scientific laboratory technician  . Financial resource strain: Not on file  . Food insecurity:    Worry: Not on file    Inability: Not on  file  . Transportation needs:    Medical: Not on file    Non-medical: Not on file  Tobacco Use  . Smoking status: Former Smoker    Types: Cigarettes    Last attempt to quit: 01/23/1993    Years since quitting: 25.1  . Smokeless tobacco: Never Used  Substance and Sexual Activity  . Alcohol use: Yes    Alcohol/week: 1.2 oz    Types: 2 Glasses of wine per week  . Drug use: No  . Sexual activity: Not on file  Lifestyle  . Physical activity:    Days per week: Not on file    Minutes per session: Not on file  . Stress: Not on file  Relationships  . Social connections:    Talks on phone: Not on file    Gets together: Not on file    Attends religious service: Not on file    Active member of club or organization: Not on file    Attends meetings of clubs or organizations: Not on file    Relationship status: Not on file  . Intimate partner violence:    Fear of current or ex partner: Not on file    Emotionally abused: Not on file    Physically abused: Not on file    Forced sexual activity: Not on file  Other Topics Concern  . Not on file  Social History Narrative  . Not on file    Family History  Problem Relation Age of Onset  . Esophageal cancer Father   . Stomach cancer Father        mets from esophagus  . Heart disease Mother   . Irritable bowel syndrome Mother   . Kidney disease Mother   . Other Mother        cushings disease  . Addison's disease Mother   . Heart failure Mother   . Stroke Mother   . Hypertension Mother   . Stroke Maternal Grandmother   . Hypertension Maternal Grandmother   . Colon cancer Neg Hx   . Heart attack Neg Hx     ROS: Fatigue and abdominal pain but no fevers or chills, productive cough, hemoptysis, dysphasia, odynophagia, melena, hematochezia, dysuria, hematuria, rash, seizure activity, orthopnea, PND, pedal edema, claudication. Remaining systems are negative.  Physical Exam: Well-developed well-nourished in no acute distress.  Skin is  warm and dry.  HEENT is normal.  Neck is supple.  Chest is clear to auscultation with normal expansion.  Cardiovascular exam is regular rate and rhythm.  Abdominal exam nontender or distended. No masses palpated. Extremities show no edema. neuro grossly intact   A/P  1 hypertension-blood pressure appears to be controlled today.  However she is describing some fatigue.  Discontinue metoprolol and  follow.  2 hyperlipidemia-continue statin.  Lipids and liver monitored by Dr. Forde Dandy.  3 chronic abdominal pain-management per gastroenterology and primary care.  Kirk Ruths, MD

## 2018-03-23 ENCOUNTER — Ambulatory Visit: Payer: Medicare Other | Admitting: Cardiology

## 2018-03-28 ENCOUNTER — Ambulatory Visit: Payer: Medicare Other | Admitting: Cardiology

## 2018-03-28 ENCOUNTER — Encounter: Payer: Self-pay | Admitting: Cardiology

## 2018-03-28 VITALS — BP 130/63 | HR 55 | Ht 64.5 in | Wt 158.4 lb

## 2018-03-28 DIAGNOSIS — I10 Essential (primary) hypertension: Secondary | ICD-10-CM

## 2018-03-28 DIAGNOSIS — E7800 Pure hypercholesterolemia, unspecified: Secondary | ICD-10-CM

## 2018-03-28 NOTE — Patient Instructions (Signed)
Your physician wants you to follow-up in: 6 MONTHS WITH DR CRENSHAW You will receive a reminder letter in the mail two months in advance. If you don't receive a letter, please call our office to schedule the follow-up appointment.   If you need a refill on your cardiac medications before your next appointment, please call your pharmacy.  

## 2018-04-07 ENCOUNTER — Other Ambulatory Visit: Payer: Self-pay | Admitting: Cardiology

## 2018-04-17 MED FILL — SHINGRIX 50 MCG SUS: 50 | 1 days supply | Qty: 1 | Fill #1

## 2018-04-24 ENCOUNTER — Other Ambulatory Visit: Payer: Self-pay | Admitting: Cardiology

## 2018-04-24 NOTE — Telephone Encounter (Signed)
Rx request sent to pharmacy.  

## 2018-05-05 ENCOUNTER — Other Ambulatory Visit: Payer: Self-pay | Admitting: Cardiology

## 2018-05-05 DIAGNOSIS — I10 Essential (primary) hypertension: Secondary | ICD-10-CM

## 2018-05-26 ENCOUNTER — Encounter: Payer: Self-pay | Admitting: Gastroenterology

## 2018-06-19 ENCOUNTER — Ambulatory Visit: Payer: Medicare Other | Admitting: Gastroenterology

## 2018-06-19 ENCOUNTER — Encounter: Payer: Self-pay | Admitting: Gastroenterology

## 2018-06-19 VITALS — BP 132/74 | HR 68 | Ht 64.5 in | Wt 156.4 lb

## 2018-06-19 DIAGNOSIS — R1012 Left upper quadrant pain: Secondary | ICD-10-CM

## 2018-06-19 DIAGNOSIS — K5909 Other constipation: Secondary | ICD-10-CM

## 2018-06-19 NOTE — Progress Notes (Addendum)
06/19/2018 Debbie Wells [id number] 12/03/1940   HISTORY OF PRESENT ILLNESS:  This is a 77 year old female who is a patient of Dr. Celesta Aver.  She was last seen here in 2014.  She has chronic constipation and chronic LUQ abdominal pain.  She reports not being able to move her bowels if she does not take something, but then says that when she takes MiraLAX daily she gets loose stools.  Right now is using MiraLAX and milk of magnesia as needed.  She is frustrated that she never has any type of normal bowel movement   In regards to her LUQ abdominal pain, it has been present since 2014 when she was last seen here.  She has had extensive evaluation in the past including EGD, colonoscopy, ultrasound, HIDA scan, CT scan.  Pain is constant.  She does say that the pain is not as severe was as it was previously.  She never saw pain management as Dr. Carlean Purl recommended in the past.  She did see a vascular surgeon at [facility] in 2014 and diagnosed her with median arcuate ligament compression but no intervention performed.  She does admit to a lot of anxiety and trouble sleeping.  Is already on trazodone at bedtime and has xanax as well.  Reports a sensation of "shakiness" inside that she thinks is due to her hypothyroid issues.     Past Medical History:  Diagnosis Date  . Adenomatous colon polyp   . Adenomatous polyp of colon 12/03/2004   2mm  . Anxiety   . Chronic headaches   . Depression   . Diverticulosis   . External hemorrhoids   . Hyperlipidemia   . Hypertension   . Hypothyroidism   . Umbilical hernia    Past Surgical History:  Procedure Laterality Date  . ABDOMINAL HYSTERECTOMY  1975  . APPENDECTOMY  1975  . COLONOSCOPY  03/03/2010,12/03/2004  . ESOPHAGOGASTRODUODENOSCOPY  12/16/11   Dr. Silvano Rusk  . OOPHORECTOMY    . spinal lipoma removed  2006  . TOTAL ABDOMINAL HYSTERECTOMY      reports that she quit smoking about 25 years ago. Her smoking use included cigarettes. She has never  used smokeless tobacco. She reports that she drinks about 2.0 standard drinks of alcohol per week. She reports that she does not use drugs. family history includes Addison's disease in her mother; Esophageal cancer in her father; Heart disease in her mother; Heart failure in her mother; Hypertension in her maternal grandmother and mother; Irritable bowel syndrome in her mother; Kidney disease in her mother; Other in her mother; Stomach cancer in her father; Stroke in her maternal grandmother and mother. Allergies  Allergen Reactions  . Iodinated Diagnostic Agents Hives    Per patient she has developed hives and itching in upper trunk of body after last 2 CT scans once she got homes that she has just treated at home with benadryl. She has not told anyone until now. States she was able to bear it   . Benazepril Cough  . Hydrocodone-Acetaminophen     Vicodin=REACTION: rash  . Sulfonamide Derivatives     REACTION: rash  . Hydrocodone-Acetaminophen Rash      Outpatient Encounter Medications as of 06/19/2018  Medication Sig  . ALPRAZolam (XANAX) 0.5 MG tablet Take 0.25-0.5 mg by mouth 2 (two) times daily. Pt takes a full tab at night and half a tab during day  . aspirin 81 MG tablet Take 81 mg by mouth daily.   Marland Kitchen  atorvastatin (LIPITOR) 10 MG tablet TAKE 1 TABLET BY MOUTH EVERY DAY  . bacitracin ophthalmic ointment Place 1 application into both eyes as needed. Dry eyes  . estrogens, conjugated, (PREMARIN) 0.45 MG tablet Take 0.45 mg by mouth daily.   . hydrochlorothiazide (MICROZIDE) 12.5 MG capsule TAKE 1 CAPSULE (12.5 MG TOTAL) BY MOUTH DAILY.  Marland Kitchen LOSARTAN POTASSIUM PO Take 75 mg by mouth daily.  Marland Kitchen LOTEMAX 0.5 % ophthalmic suspension Place 1 drop into both eyes daily. As needed. For dry eyes  . magnesium hydroxide (MILK OF MAGNESIA) 400 MG/5ML suspension Take by mouth daily as needed for mild constipation.  . metoprolol succinate (TOPROL-XL) 25 MG 24 hr tablet TAKE 1/2 TABLET EVERY DAY  .  Multiple Vitamin (MULTIVITAMIN) capsule Take 1 capsule by mouth daily.  . Multiple Vitamins-Minerals (HAIR SKIN AND NAILS FORMULA PO) Take 1 capsule by mouth daily.  . Omega-3 Fatty Acids (FISH OIL PO) Take 1 tablet by mouth daily.  . polyethylene glycol (MIRALAX / GLYCOLAX) packet Take 17 g by mouth daily as needed.  Marland Kitchen SYNTHROID 112 MCG tablet Take 112 mcg by mouth daily.  . traZODone (DESYREL) 100 MG tablet Take 100 mg by mouth at bedtime.   . [DISCONTINUED] escitalopram (LEXAPRO) 10 MG tablet Take 10 mg by mouth daily.  . [DISCONTINUED] losartan (COZAAR) 50 MG tablet TAKE 2 TABLETS (100 MG TOTAL) BY MOUTH DAILY.   No facility-administered encounter medications on file as of 06/19/2018.      REVIEW OF SYSTEMS  : All other systems reviewed and negative except where noted in the History of Present Illness.   PHYSICAL EXAM: BP 132/74   Pulse 68   Ht 5' 4.5" (1.638 m)   Wt 156 lb 6 oz (70.9 kg)   BMI 26.43 kg/m  General: Well developed white female in no acute distress Head: Normocephalic and atraumatic Eyes:  Sclerae anicteric, conjunctiva pink. Ears: Normal auditory acuity Lungs: Clear throughout to auscultation; no increased WOB. Heart: Regular rate and rhythm; no M/R/G. Abdomen: Soft, non-distended.  BS present.  Non-tender. Musculoskeletal: Symmetrical with no gross deformities  Skin: No lesions on visible extremities Extremities: No edema  Neurological: Alert oriented x 4, grossly non-focal Psychological:  Alert and cooperative. Normal mood and affect  ASSESSMENT AND PLAN: *Chronic constipation: Reports not being able to move her bowels if she does not take something, but then says that when she takes MiraLAX daily she gets loose stools.  Right now is using MiraLAX and milk of magnesia as needed.  I have advised her to go on MiraLAX daily and to also start a daily powdered fiber supplement such as Benefiber or Citrucel to help add bulk to the stools and absorb some of the  water that the MiraLAX pulls into the colon.  Will have her call in 4 weeks with an update on her symptoms. *Chronic left upper quadrant abdominal pain: This pain has been present since 2014 at least when she was last seen here.  She has had extensive evaluation in the past.  She does say that the pain is not as severe was as it was previously.  She never saw pain management.  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  Question if she would benefit from TCA versus SSRI.  She has a lot of underlying anxiety and I think that is likely contributing to some degree.  She is already on trazodone at bedtime, however.  Will review with Dr. Carlean Purl for consideration in the future.  **She was  asking about a repeat colonoscopy.  She is entered for 10-year recall her last colonoscopy was without polyps.  She says that she had previously discussed with Dr. Carlean Purl and they talked about repeating it 5-year interval.  In regards to screening and in regards to her symptoms I do not think that it is necessary at this time as her symptoms are chronic and no contributing causes been found previously.   CC:  [name], MD  Complicated situation with pain that is refractory and unlikely to be all caused by median arcuate ligament syndrome so can understand why vascular surgeon did not recommend surgical intervention. She can return to see me to review things if she likes. I think trial of IB Donald Prose is low risk and potentially helpful.  Gatha Mayer, MD, [facility]

## 2018-06-19 NOTE — Patient Instructions (Addendum)
Benefiber or Citrucel powder 2 spoonfuls daily mix with 8 oz liquid.   Please call the office with an update of your symptoms in the next two weeks and ask for Koren Shiver, RN, BSN.

## 2018-07-05 ENCOUNTER — Telehealth: Payer: Self-pay | Admitting: Gastroenterology

## 2018-07-05 NOTE — Telephone Encounter (Signed)
The pt has been advised to continue recommendations per [name] and f/u with Dr Carlean Purl on 10/22.  The pt agreed and will call with any concerns prior.

## 2018-08-22 ENCOUNTER — Ambulatory Visit: Payer: Medicare Other | Admitting: Internal Medicine

## 2018-08-22 ENCOUNTER — Encounter: Payer: Self-pay | Admitting: Internal Medicine

## 2018-08-22 VITALS — BP 126/80 | HR 72 | Ht 64.5 in | Wt 153.1 lb

## 2018-08-22 DIAGNOSIS — K581 Irritable bowel syndrome with constipation: Secondary | ICD-10-CM | POA: Diagnosis not present

## 2018-08-22 DIAGNOSIS — I774 Celiac artery compression syndrome: Secondary | ICD-10-CM

## 2018-08-22 NOTE — Patient Instructions (Addendum)
Dr Carlean Purl recommends that you complete a bowel purge (to clean out your bowels). Please do the following: Purchase a bottle of Miralax over the counter as well as a box of 5 mg dulcolax tablets. Take 4 dulcolax tablets. Wait 1 hour. You will then drink 6-8 capfuls of Miralax mixed in an adequate amount of water/juice/gatorade (you may choose which of these liquids to drink) over the next 2-3 hours. You should expect results within 1 to 6 hours after completing the bowel purge.  Increase your Benefiber to 2 tablespoons every AM and take a dose of Miralax in the PM.  We are giving you calmoseptine for your rectal irritation.  MyChart [place] with any questions or call us.    I appreciate the opportunity to care for you. Ronney Lion, [facility]

## 2018-08-22 NOTE — Progress Notes (Signed)
Debbie Wells 77 y.o. 1941/02/14 [id number]  Assessment & Plan:   Encounter Diagnoses  Name Primary?  . Irritable bowel syndrome with constipation Yes  . Median arcuate ligament syndrome (HCC)     MiraLax Purge then increase Benefiber to 2 tablespoons daily and continue on MiraLAX.  She may titrate that up also if needed i.e. MiraLAX she is to call me as needed.  Or schedule follow-up as needed  Calmoseptine prn anal irritation  She is reassured about the median arcuate ligament syndrome and the lack of intestinal ischemia  I appreciate the opportunity to care for this patient. CC: [name], MD  Subjective:   Chief Complaint:  HPI Patient is here for follow-up of her constipation and IBS symptoms.  She saw [name] in August and [name] recommended adding Benefiber to regular MiraLAX and that has helped.  The patient still has to take some milk of magnesia times. Treatment now is Benefiber 2 tsp AM Miralax 17 g at night MOM prn when can'tdefecate - and then liquid or little squiggly things  Another problem is Shakiness and weakness at different times- tears up he starts talking about this "I have extremely low thyroid" Her TSH has been low and Dr. Forde Dandy is adjusting her Synthroid dose is down   Domino pain level still there but not as bad as in past, she is under the impression that she has triple-vessel abdominal atherosclerosis causing ischemia.  I explained to her that she does not have that and either that was miscommunicated or she misunderstood when she saw the vascular surgeon.  She felt much relief.  She was heme-negative last year on I FOBT test  Last colonoscopy 2014- mild diverticulosis Allergies  Allergen Reactions  . Iodinated Diagnostic Agents Hives    Per patient she has developed hives and itching in upper trunk of body after last 2 CT scans once she got homes that she has just treated at home with benadryl. She has not told anyone until  now. States she was able to bear it   . Benazepril Cough  . Hydrocodone-Acetaminophen     Vicodin=REACTION: rash  . Sulfonamide Derivatives     REACTION: rash  . Hydrocodone-Acetaminophen Rash   Current Meds  Medication Sig  . ALPRAZolam (XANAX) 0.5 MG tablet Take 0.25-0.5 mg by mouth 2 (two) times daily. Pt takes a full tab at night and whole a tab during day  . aspirin 81 MG tablet Take 81 mg by mouth daily.   Marland Kitchen atorvastatin (LIPITOR) 10 MG tablet TAKE 1 TABLET BY MOUTH EVERY DAY  . bacitracin ophthalmic ointment Place 1 application into both eyes as needed. Dry eyes  . estrogens, conjugated, (PREMARIN) 0.45 MG tablet Take 0.45 mg by mouth daily.   . hydrochlorothiazide (MICROZIDE) 12.5 MG capsule TAKE 1 CAPSULE (12.5 MG TOTAL) BY MOUTH DAILY.  Marland Kitchen LOSARTAN POTASSIUM PO Take 100 mg by mouth daily.   Marland Kitchen LOTEMAX 0.5 % ophthalmic suspension Place 1 drop into both eyes daily. As needed. For dry eyes  . magnesium hydroxide (MILK OF MAGNESIA) 400 MG/5ML suspension Take by mouth daily as needed for moderate constipation.   . metoprolol succinate (TOPROL-XL) 25 MG 24 hr tablet TAKE 1/2 TABLET EVERY DAY  . Multiple Vitamin (MULTIVITAMIN) capsule Take 1 capsule by mouth daily.  . Multiple Vitamins-Minerals (HAIR SKIN AND NAILS FORMULA PO) Take 1 capsule by mouth daily.  . Omega-3 Fatty Acids (FISH OIL PO) Take 1 tablet by mouth daily.  Marland Kitchen  polyethylene glycol (MIRALAX / GLYCOLAX) packet Take 17 g by mouth daily as needed.  Marland Kitchen SYNTHROID 112 MCG tablet Take 112 mcg by mouth daily.  . traZODone (DESYREL) 100 MG tablet Take 100 mg by mouth at bedtime.   . Wheat Dextrin (BENEFIBER DRINK MIX PO) Take by mouth. Two teaspoons daily   Past Medical History:  Diagnosis Date  . Adenomatous colon polyp   . Adenomatous polyp of colon 12/03/2004   51mm  . Anxiety   . Chronic headaches   . Depression   . Diverticulosis   . External hemorrhoids   . Hyperlipidemia   . Hypertension   . Hypothyroidism   .  Umbilical hernia    Past Surgical History:  Procedure Laterality Date  . ABDOMINAL HYSTERECTOMY  1975  . APPENDECTOMY  1975  . COLONOSCOPY  03/03/2010,12/03/2004  . ESOPHAGOGASTRODUODENOSCOPY  12/16/11   Dr. Silvano Rusk  . OOPHORECTOMY    . spinal lipoma removed  2006  . TOTAL ABDOMINAL HYSTERECTOMY     Social History   Social History Narrative   Patient is married she is retired and has 2 children   About 2 alcoholic beverages a week no drugs, no current tobacco she is a former smoker   family history includes Addison's disease in her mother; Esophageal cancer in her father; Heart disease in her mother; Heart failure in her mother; Hypertension in her maternal grandmother and mother; Irritable bowel syndrome in her mother; Kidney disease in her mother; Other in her mother; Stomach cancer in her father; Stroke in her maternal grandmother and mother.   Review of Systems See above  Objective:   Physical Exam BP 126/80   Pulse 72   Ht 5' 4.5" (1.638 m)   Wt 153 lb 2 oz (69.5 kg)   BMI 25.88 kg/m   No acute distress  abdomen is soft and nontender bowel sounds are present there is no organomegaly hernia or mass  [name], LPN present Anoderm inspection revealed slight erythema Anal wink was does not Digital exam revealed normal resting tone and voluntary squeeze. No mass or rectocele present. Simulated defecation with valsalva revealed appropriate abdominal contraction and descent.   25 minutes time spent with patient > half in counseling coordination of care

## 2018-09-04 NOTE — Progress Notes (Signed)
HPI: FU hypertension. She has a history of hypertension which has been somewhat difficult to control.She had previously worn an outpatient 24-hour blood pressure monitor which showed wide swings of blood pressure.She has had a 24-hour urine collection for metanephrines which was normal. She has had blood work in Dr. Jerilynn Som which has ruled out Cushing's syndrome.Her cardiac workup has included a Lexiscan Myoview stress test on 12/07/12 showing no evidence of ischemia and her ejection fraction was 60% and there were no wall motion abnormalities. She had a previous echocardiogram 09/17/11 showing normal LV function and mild mitral regurgitation. Renal dopplers 2014 showed mild 1-59 right RAS. She has had problems with chronic steady pain in the left epigastrium and left upper quadrant and has seen her gastroenterologist Dr. Carlean Purl. She has seen Dr. Mamie Nick regarding question of mesenteric and or celiac artery stenosis as a possible cause of her left upper quadrant pain. Dr. Kellie Simmering did not feel that the quality of her pain suggested mesenteric ischemia. She has also been seen by vascular surgery at [facility]. They did not feel that she would benefit from surgery. Previous cough with ACEI. Bradycardia with higher doses of beta blocker. Patient had lifeline screening previously and her carotids were normal, no aneurysm and normal ABIs.Since last seen,there is no dyspnea or chest pain.  She feels as though she is having problems with her thyroid which is being taken care of by Dr. Forde Dandy.  Her blood pressure has been elevated recently.  She has diminished sleep.  She complains of some pain in her lower extremities bilaterally at night.  No claudication.  Current Outpatient Medications  Medication Sig Dispense Refill  . ALPRAZolam (XANAX) 0.5 MG tablet Take 0.25-0.5 mg by mouth 2 (two) times daily. Pt takes a full tab at night and whole a tab during day    . aspirin 81 MG tablet Take 81  mg by mouth daily.     Marland Kitchen atorvastatin (LIPITOR) 10 MG tablet TAKE 1 TABLET BY MOUTH EVERY DAY 90 tablet 2  . bacitracin ophthalmic ointment Place 1 application into both eyes as needed. Dry eyes  0  . estrogens, conjugated, (PREMARIN) 0.45 MG tablet Take 0.45 mg by mouth daily.     . hydrochlorothiazide (MICROZIDE) 12.5 MG capsule TAKE 1 CAPSULE (12.5 MG TOTAL) BY MOUTH DAILY. 90 capsule 1  . LOSARTAN POTASSIUM PO Take 100 mg by mouth daily.     Marland Kitchen LOTEMAX 0.5 % ophthalmic suspension Place 1 drop into both eyes daily. As needed. For dry eyes    . magnesium hydroxide (MILK OF MAGNESIA) 400 MG/5ML suspension Take by mouth daily as needed for moderate constipation.     . metoprolol succinate (TOPROL-XL) 25 MG 24 hr tablet TAKE 1/2 TABLET EVERY DAY 30 tablet 4  . Multiple Vitamin (MULTIVITAMIN) capsule Take 1 capsule by mouth daily.    . Omega-3 Fatty Acids (FISH OIL PO) Take 1 tablet by mouth daily.    . polyethylene glycol (MIRALAX / GLYCOLAX) packet Take 17 g by mouth daily as needed.    Marland Kitchen SYNTHROID 112 MCG tablet Take 112 mcg by mouth daily.  1  . traZODone (DESYREL) 100 MG tablet Take 100 mg by mouth at bedtime.   0  . Wheat Dextrin (BENEFIBER DRINK MIX PO) Take by mouth. Two teaspoons daily     No current facility-administered medications for this visit.      Past Medical History:  Diagnosis Date  . Adenomatous colon polyp   .  Adenomatous polyp of colon 12/03/2004   47mm  . Anxiety   . Chronic headaches   . Depression   . Diverticulosis   . External hemorrhoids   . Hyperlipidemia   . Hypertension   . Hypothyroidism   . Umbilical hernia     Past Surgical History:  Procedure Laterality Date  . ABDOMINAL HYSTERECTOMY  1975  . APPENDECTOMY  1975  . COLONOSCOPY  03/03/2010,12/03/2004  . ESOPHAGOGASTRODUODENOSCOPY  12/16/11   Dr. Silvano Rusk  . OOPHORECTOMY    . spinal lipoma removed  2006  . TOTAL ABDOMINAL HYSTERECTOMY      Social History   Socioeconomic History  . Marital  status: Married    Spouse name: Not on file  . Number of children: 2  . Years of education: Not on file  . Highest education level: Not on file  Occupational History  . Occupation: retired  Scientific laboratory technician  . Financial resource strain: Not on file  . Food insecurity:    Worry: Not on file    Inability: Not on file  . Transportation needs:    Medical: Not on file    Non-medical: Not on file  Tobacco Use  . Smoking status: Former Smoker    Types: Cigarettes    Last attempt to quit: 01/23/1993    Years since quitting: 25.6  . Smokeless tobacco: Never Used  Substance and Sexual Activity  . Alcohol use: Yes    Alcohol/week: 2.0 standard drinks    Types: 2 Glasses of wine per week  . Drug use: No  . Sexual activity: Not on file  Lifestyle  . Physical activity:    Days per week: Not on file    Minutes per session: Not on file  . Stress: Not on file  Relationships  . Social connections:    Talks on phone: Not on file    Gets together: Not on file    Attends religious service: Not on file    Active member of club or organization: Not on file    Attends meetings of clubs or organizations: Not on file    Relationship status: Not on file  . Intimate partner violence:    Fear of current or ex partner: Not on file    Emotionally abused: Not on file    Physically abused: Not on file    Forced sexual activity: Not on file  Other Topics Concern  . Not on file  Social History Narrative   Patient is married she is retired and has 2 children   About 2 alcoholic beverages a week no drugs, no current tobacco she is a former smoker    Family History  Problem Relation Age of Onset  . Esophageal cancer Father   . Stomach cancer Father        mets from esophagus  . Heart disease Mother   . Irritable bowel syndrome Mother   . Kidney disease Mother   . Other Mother        cushings disease  . Addison's disease Mother   . Heart failure Mother   . Stroke Mother   . Hypertension Mother     . Stroke Maternal Grandmother   . Hypertension Maternal Grandmother   . Colon cancer Neg Hx   . Heart attack Neg Hx     ROS: Bilateral feet pain at night and insomnia but no fevers or chills, productive cough, hemoptysis, dysphasia, odynophagia, melena, hematochezia, dysuria, hematuria, rash, seizure activity, orthopnea, PND, pedal edema,  claudication. Remaining systems are negative.  Physical Exam: Well-developed well-nourished in no acute distress.  Skin is warm and dry.  HEENT is normal.  Neck is supple.  Chest is clear to auscultation with normal expansion.  Cardiovascular exam is regular rate and rhythm.  Abdominal exam nontender or distended. No masses palpated. Extremities show no edema. neuro grossly intact  ECG-normal sinus rhythm at a rate of 62.  No ST changes.  Personally reviewed  A/P  1 hypertension-patient states that she has had increasing blood pressures over the past 1 month.  We did check her machine today in the office and it was 30 points higher than cuff.  They will purchase a new blood pressure cuff and then correlated with ours.  She will follow her blood pressure and we will advance medications as needed.  2 hyperlipidemia-continue statin.  Lipids and liver monitored by primary care.  3 abdominal pain-followed by gastroenterology and primary care.  Chronic issue.  Kirk Ruths, MD

## 2018-09-11 DIAGNOSIS — E871 Hypo-osmolality and hyponatremia: Secondary | ICD-10-CM | POA: Insufficient documentation

## 2018-09-11 DIAGNOSIS — G43909 Migraine, unspecified, not intractable, without status migrainosus: Secondary | ICD-10-CM | POA: Insufficient documentation

## 2018-09-11 DIAGNOSIS — R7301 Impaired fasting glucose: Secondary | ICD-10-CM | POA: Insufficient documentation

## 2018-09-13 ENCOUNTER — Ambulatory Visit: Payer: Medicare Other | Admitting: Cardiology

## 2018-09-13 ENCOUNTER — Encounter: Payer: Self-pay | Admitting: Cardiology

## 2018-09-13 VITALS — BP 130/78 | HR 62 | Ht 64.5 in | Wt 150.0 lb

## 2018-09-13 DIAGNOSIS — I10 Essential (primary) hypertension: Secondary | ICD-10-CM | POA: Diagnosis not present

## 2018-09-13 DIAGNOSIS — E7800 Pure hypercholesterolemia, unspecified: Secondary | ICD-10-CM | POA: Diagnosis not present

## 2018-09-13 NOTE — Patient Instructions (Signed)
Medication Instructions:  Your physician recommends that you continue on your current medications as directed. Please refer to the Current Medication list given to you today.  If you need a refill on your cardiac medications before your next appointment, please call your pharmacy.    Follow-Up: At [facility], you and your health needs are our priority.  As part of our continuing mission to provide you with exceptional heart care, we have created designated Provider Care Teams.  These Care Teams include your primary Cardiologist (physician) and Advanced Practice Providers (APPs -  Physician Assistants and Nurse Practitioners) who all work together to provide you with the care you need, when you need it. You will need a follow up appointment in 6 months.  Please call our office 2 months in advance to schedule this appointment.  You may see Brian Crenshaw, MD or one of the following Advanced Practice Providers on your designated Care Team:   Luke Kilroy, PA-C Krista Kroeger, PA-C . Callie Goodrich, PA-C    

## 2018-09-22 ENCOUNTER — Telehealth: Payer: Self-pay

## 2018-09-22 NOTE — Telephone Encounter (Signed)
Patient walked in office stating she bought a new B/P machine wanting to compare with our B/P monitor.B/P checked with office 140/72 pulse 76.B/P with patient's new machine 130/71 pulse 74.Advised ok to use her new machine.Advised I will let Dr.Crenshaw know.

## 2018-09-27 ENCOUNTER — Telehealth: Payer: Self-pay | Admitting: Cardiology

## 2018-09-27 ENCOUNTER — Encounter: Payer: Self-pay | Admitting: Cardiology

## 2018-09-27 ENCOUNTER — Ambulatory Visit: Payer: Medicare Other | Admitting: Cardiology

## 2018-09-27 DIAGNOSIS — I10 Essential (primary) hypertension: Secondary | ICD-10-CM | POA: Insufficient documentation

## 2018-09-27 DIAGNOSIS — F418 Other specified anxiety disorders: Secondary | ICD-10-CM

## 2018-09-27 MED ORDER — LOSARTAN POTASSIUM 50 MG PO TABS: 50.0000 mg | ORAL_TABLET | Freq: Two times a day (BID) | ORAL | 2 refills | Status: DC

## 2018-09-27 MED ORDER — AMLODIPINE BESYLATE 5 MG PO TABS: 5.0000 mg | ORAL_TABLET | Freq: Every day | ORAL | 1 refills | Status: DC

## 2018-09-27 NOTE — Assessment & Plan Note (Signed)
Pt added on to the schedule today with complaints of accelerated hypertension.

## 2018-09-27 NOTE — Telephone Encounter (Signed)
Received call from patient with concerns about elevated blood pressure.   She states she has been up all night with elevated blood pressures.   She states this has been happening for a long time but did not realize that it was due to her blood pressure.    BP readings are as noted in previous message.   She states she is very weak and fatigued.  Denies blurred vision, HA, dizziness.   She would like to be seen ASAP.   appt scheduled for 9AM with L. Kilroy PA.     Patient verbalized understanding.

## 2018-09-27 NOTE — Progress Notes (Signed)
09/27/2018 Debbie Wells   December 05, 1940  [id number]  Primary Physician Debbie Bowen, MD Primary Cardiologist: Dr Debbie Wells  HPI: Ms. Debbie Wells is a 77 year old female previously followed by Dr. Mare Wells, now Dr. Stanford Wells.  She has a history of hypertension.  This is been documented to be labile.  She is had prior work-up including 24-hour urines for metanephrines.  She is been followed by Dr. Forde Wells and has ruled out for Cushing's syndrome.  She had a remote Myoview in 2014 showing no ischemia and good LV function.  Echocardiogram in 2012 showed normal LV function with mild MR.  Renal Dopplers in 2014 showed moderate right renal artery stenosis with the narrowing of 1 to 59%.  She is had chronic abdominal pain.  She sees Dr. Carlean Wells for this.  She is also seeing Dr. 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 Kellie Wells did not feel that her symptoms suggested mesenteric ischemia.  She is been followed at [facility] by the vascular surgery department although they did not feel she would benefit from surgery.  She has had a prior cough with an ACE inhibitor and is on losartan.  She has had some bradycardia with higher doses of beta-blockers.  She has treated hypothyroidism and Dr. Forde Wells has been adjusting her Synthroid.  She just saw Dr. Stanford Wells 09/13/2018 and her blood pressure was stable.  Patient called the office this morning quite concerned because her blood pressures been elevated, 323 systolic.  She said she felt poorly last night with weakness and dizziness and decided to check her pressure.  She noted it was elevated.  She took her prior blood pressure several times during the night and it remained to 557-3 90 systolic.  She is she is quite concerned about her pressure.  She also tells me that she has been unable to sleep.  She says this been going on for some time.  She is actually got an appointment to see a neurologist for further evaluation of this.  Her husband accompanied  her to the office today and he is a retired Software engineer and has a good knowledge of what medications she is on.   Current Outpatient Medications  Medication Sig Dispense Refill  . ALPRAZolam (XANAX) 0.5 MG tablet Take 0.25-0.5 mg by mouth 2 (two) times daily. Pt takes a full tab at night and whole a tab during day    . aspirin 81 MG tablet Take 81 mg by mouth daily.     Marland Kitchen atorvastatin (LIPITOR) 10 MG tablet TAKE 1 TABLET BY MOUTH EVERY DAY 90 tablet 2  . bacitracin ophthalmic ointment Place 1 application into both eyes as needed. Dry eyes  0  . estrogens, conjugated, (PREMARIN) 0.45 MG tablet Take 0.45 mg by mouth daily.     . hydrochlorothiazide (MICROZIDE) 12.5 MG capsule TAKE 1 CAPSULE (12.5 MG TOTAL) BY MOUTH DAILY. 90 capsule 1  . LOTEMAX 0.5 % ophthalmic suspension Place 1 drop into both eyes daily. As needed. For dry eyes    . magnesium hydroxide (MILK OF MAGNESIA) 400 MG/5ML suspension Take by mouth daily as needed for moderate constipation.     . metoprolol succinate (TOPROL-XL) 25 MG 24 hr tablet TAKE 1/2 TABLET EVERY DAY 30 tablet 4  . Multiple Vitamin (MULTIVITAMIN) capsule Take 1 capsule by mouth daily.    . Omega-3 Fatty Acids (FISH OIL PO) Take 1 tablet by mouth daily.    . polyethylene glycol (MIRALAX / GLYCOLAX) packet Take 17 g by  mouth daily as needed.    Marland Kitchen SYNTHROID 112 MCG tablet Take 112 mcg by mouth daily.  1  . traZODone (DESYREL) 100 MG tablet Take 100 mg by mouth at bedtime.   0  . Wheat Dextrin (BENEFIBER DRINK MIX PO) Take by mouth. Two teaspoons daily    . amLODipine (NORVASC) 5 MG tablet Take 1 tablet (5 mg total) by mouth daily. 90 tablet 1  . losartan (COZAAR) 50 MG tablet Take 1 tablet (50 mg total) by mouth 2 (two) times daily. 180 tablet 2   No current facility-administered medications for this visit.     Allergies  Allergen Reactions  . Iodinated Diagnostic Agents Hives    Per patient she has developed hives and itching in upper trunk of body after last  2 CT scans once she got homes that she has just treated at home with benadryl. She has not told anyone until now. States she was able to bear it   . Benazepril Cough  . Hydrocodone-Acetaminophen     Vicodin=REACTION: rash  . Sulfonamide Derivatives     REACTION: rash  . Hydrocodone-Acetaminophen Rash    Past Medical History:  Diagnosis Date  . Adenomatous colon polyp   . Adenomatous polyp of colon 12/03/2004   85mm  . Anxiety   . Chronic headaches   . Depression   . Diverticulosis   . External hemorrhoids   . Hyperlipidemia   . Hypertension   . Hypothyroidism   . Umbilical hernia     Social History   Socioeconomic History  . Marital status: Married    Spouse name: Not on file  . Number of children: 2  . Years of education: Not on file  . Highest education level: Not on file  Occupational History  . Occupation: retired  Scientific laboratory technician  . Financial resource strain: Not on file  . Food insecurity:    Worry: Not on file    Inability: Not on file  . Transportation needs:    Medical: Not on file    Non-medical: Not on file  Tobacco Use  . Smoking status: Former Smoker    Types: Cigarettes    Last attempt to quit: 01/23/1993    Years since quitting: 25.6  . Smokeless tobacco: Never Used  Substance and Sexual Activity  . Alcohol use: Yes    Alcohol/week: 2.0 standard drinks    Types: 2 Glasses of wine per week  . Drug use: No  . Sexual activity: Not on file  Lifestyle  . Physical activity:    Days per week: Not on file    Minutes per session: Not on file  . Stress: Not on file  Relationships  . Social connections:    Talks on phone: Not on file    Gets together: Not on file    Attends religious service: Not on file    Active member of club or organization: Not on file    Attends meetings of clubs or organizations: Not on file    Relationship status: Not on file  . Intimate partner violence:    Fear of current or ex partner: Not on file    Emotionally abused:  Not on file    Physically abused: Not on file    Forced sexual activity: Not on file  Other Topics Concern  . Not on file  Social History Narrative   Patient is married she is retired and has 2 children   About 2 alcoholic beverages a week  no drugs, no current tobacco she is a former smoker     Family History  Problem Relation Age of Onset  . Esophageal cancer Father   . Stomach cancer Father        mets from esophagus  . Heart disease Mother   . Irritable bowel syndrome Mother   . Kidney disease Mother   . Other Mother        cushings disease  . Addison's disease Mother   . Heart failure Mother   . Stroke Mother   . Hypertension Mother   . Stroke Maternal Grandmother   . Hypertension Maternal Grandmother   . Colon cancer Neg Hx   . Heart attack Neg Hx      Review of Systems: General: negative for chills, fever, night sweats or weight changes.  Cardiovascular: negative for chest pain, dyspnea on exertion, edema, orthopnea, palpitations, paroxysmal nocturnal dyspnea or shortness of breath Dermatological: negative for rash Respiratory: negative for cough or wheezing Urologic: negative for hematuria Abdominal: negative for nausea, vomiting, diarrhea, bright red blood per rectum, melena, or hematemesis Neurologic: negative for visual changes, syncope, or dizziness All other systems reviewed and are otherwise negative except as noted above.    Blood pressure (!) 172/90, pulse 86, height 5' 4.5" (1.638 m), weight 143 lb 12.8 oz (65.2 kg), SpO2 96 %.  General appearance: alert, cooperative, no distress and anxious Neck: no carotid bruit and no JVD Lungs: clear to auscultation bilaterally Heart: regular rate and rhythm Extremities: no edema Skin: pale warm dry Neurologic: Grossly normal   ASSESSMENT AND PLAN:   Accelerated hypertension Pt added on to the schedule today with complaints of accelerated hypertension.   Anxiety about health .   PLAN  I suggested she  take her Losartan in the evening.  She was reluctant to take the full dose Q HS and mentioned that Dr Debbie Wells had her on Losartan 50 mg BID.  I suggested if she wanted to try taking it that way that would be okay.  I also suggested adding amlodipine 5 mg every morning.  I considered trying to increase her Toprol to 25 mg a day but I think it would be best to only make one change at a time.  I did recheck her blood pressure before she left the office - it was 152/82.  She admitted to me that she has lots of family coming in for the holidays and she is somewhat anxious about this and I explained and that certainly could could be contributing to her hypertension.  Kerin Ransom PA-C 09/27/2018 9:43 AM

## 2018-09-27 NOTE — Patient Instructions (Signed)
Medication Instructions:  START Norvasc 5mg  take 1 tablet every morning  CHANGE Losartan 50mg  take 1 tablet twice a day  If you need a refill on your cardiac medications before your next appointment, please call your pharmacy.   Lab work: None  If you have labs (blood work) drawn today and your tests are completely normal, you will receive your results only by: Marland Kitchen MyChart Message (if you have MyChart) OR . A paper copy in the mail If you have any lab test that is abnormal or we need to change your treatment, we will call you to review the results.  Testing/Procedures: None   Follow-Up: At [facility], you and your health needs are our priority.  As part of our continuing mission to provide you with exceptional heart care, we have created designated Provider Care Teams.  These Care Teams include your primary Cardiologist (physician) and Advanced Practice Providers (APPs -  Physician Assistants and Nurse Practitioners) who all work together to provide you with the care you need, when you need it. . Your physician recommends that you schedule a follow-up appointment in: 4-6 weeks with Dr Stanford Breed or his APP  Any Other Special Instructions Will Be Listed Below (If Applicable).

## 2018-09-27 NOTE — Telephone Encounter (Signed)
New message    Pt c/o BP issue: STAT if pt c/o blurred vision, one-sided weakness or slurred speech  1. What are your last 5 BP readings? 193/80, 174/84, 175/82, 177/91, 174/84  2. Are you having any other symptoms (ex. Dizziness, headache, blurred vision, passed out)? Pt states she is not steady on her feet.  3. What is your BP issue? Pt states that her BP has been elevated all night.

## 2018-10-12 ENCOUNTER — Ambulatory Visit: Payer: Medicare Other | Admitting: Neurology

## 2018-10-12 ENCOUNTER — Encounter: Payer: Self-pay | Admitting: Neurology

## 2018-10-12 VITALS — BP 127/77 | HR 97 | Ht 64.5 in | Wt 147.0 lb

## 2018-10-12 DIAGNOSIS — I152 Hypertension secondary to endocrine disorders: Secondary | ICD-10-CM | POA: Insufficient documentation

## 2018-10-12 DIAGNOSIS — G4701 Insomnia due to medical condition: Secondary | ICD-10-CM | POA: Insufficient documentation

## 2018-10-12 DIAGNOSIS — R251 Tremor, unspecified: Secondary | ICD-10-CM

## 2018-10-12 DIAGNOSIS — F418 Other specified anxiety disorders: Secondary | ICD-10-CM | POA: Diagnosis not present

## 2018-10-12 DIAGNOSIS — E0590 Thyrotoxicosis, unspecified without thyrotoxic crisis or storm: Secondary | ICD-10-CM | POA: Diagnosis not present

## 2018-10-12 DIAGNOSIS — R946 Abnormal results of thyroid function studies: Secondary | ICD-10-CM

## 2018-10-12 MED ORDER — SERTRALINE HCL 25 MG PO TABS: 25.0000 mg | ORAL_TABLET | Freq: Every day | ORAL | 5 refills | Status: DC

## 2018-10-12 NOTE — Patient Instructions (Signed)

## 2018-10-12 NOTE — Progress Notes (Signed)
SLEEP MEDICINE CLINIC   Provider:  Larey Seat, M D  Primary Care Physician:  [name], MD   Referring Provider: [name], MD   Chief Complaint  Patient presents with  . New Patient (Initial Visit)    pt with husband, rm 37. pt complains of last 4-5 months she has had a hard time sleeping. pt states that she is sleeping about  2 -3 hrs and wake up and unable to go back to sleep. she has started taking 0.25 xanax and benadryl  to go back to sleep. she states may get 2-3 more hours of sleep after taking the medication. pt doesnt snore in sleep. pt has never had a sleep study before. pt complains of being groggy and emotional. pt has HTN.    HPI:  Debbie Wells is a 77 y.o. female patient  , seen here on 10-12-2018  in a referral from Dr. 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  The patient was referred to Cardiology for lower extremity pain,  The circulation was fine.  Her thyroid function has been high, her TSH was very low for years and in May 2019 , after had [name] adjusted the dose-the TSH  increased to 2.2 from previously 0.18 (!) and 0.68 since 2014.  At the time her TSH was 0.18 she had been cold, reportedly had bradycardia, and was reporting low energy. She felt jittery , too- unexpectedly for a high thyroid function.  She developed also numbness in her legs- which has improved over the last 2 month.    The increase in TSH was likely taking place at the time her insomnia became more prominent, an this has gotten worse.   Chief complaint according to patient :" I can't sleep, I am jittery"  Sleep habits are as follows: goes to bed at 11 o clock and can go to sleep but not stay asleep- wakes about 2 -3 hours later, stays awake until she gets up to get  Second dose, such as benadryl, Xanax. On a 14 day routine right now. averaging 5 hours of sleep all together, not able to nap    Sleep medical history: 09/16/2018 - HTN peaked, started Amlodipine.  She just  recieved a diagnosis of hyponatremia. Long standing "hyperthyroidism" acc. to low TSH.    Family sleep history:  Mother had MEN, multiple endocrine neoplasia . Parathyroid disease. She had kidney disease.  Mother also have suffered from Cushing's disease resulting in Addison's disease, hypertension which has affected not just mother but also maternal grandmother and maternal aunts.  2 maternal aunts have suffered from breast cancer, a paternal uncle took his own life after a struggle with severe depression.   Social history: married. Retired. Non smoker, non drinker, caffeine : coffee in early AM, no sodas, no caffeinated iced tea.     Review of Systems: Out of a complete 14 system review, the patient complains of only the following symptoms, and all other reviewed systems are negative. Constant constipation, nausea but no vomiting. Mesenteric arterial stenosis, due to acuate ligament ( Dr. Carlean Purl).    Epworth score 5/24 , Fatigue severity score 58/63  , depression score n/a    Social History   Socioeconomic History  . Marital status: Married    Spouse name: Not on file  . Number of children: 2  . Years of education: Not on file  . Highest education level: Not on file  Occupational History  . Occupation: retired  Scientific laboratory technician  .  Financial resource strain: Not on file  . Food insecurity:    Worry: Not on file    Inability: Not on file  . Transportation needs:    Medical: Not on file    Non-medical: Not on file  Tobacco Use  . Smoking status: Former Smoker    Types: Cigarettes    Last attempt to quit: 01/23/1993    Years since quitting: 25.7  . Smokeless tobacco: Never Used  Substance and Sexual Activity  . Alcohol use: Yes    Alcohol/week: 2.0 standard drinks    Types: 2 Glasses of wine per week  . Drug use: No  . Sexual activity: Not on file  Lifestyle  . Physical activity:    Days per week: Not on file    Minutes per session: Not on file  . Stress: Not on file    Relationships  . Social connections:    Talks on phone: Not on file    Gets together: Not on file    Attends religious service: Not on file    Active member of club or organization: Not on file    Attends meetings of clubs or organizations: Not on file    Relationship status: Not on file  . Intimate partner violence:    Fear of current or ex partner: Not on file    Emotionally abused: Not on file    Physically abused: Not on file    Forced sexual activity: Not on file  Other Topics Concern  . Not on file  Social History Narrative   Patient is married she is retired and has 2 children   About 2 alcoholic beverages a week no drugs, no current tobacco she is a former smoker    Family History  Problem Relation Age of Onset  . Esophageal cancer Father   . Stomach cancer Father        mets from esophagus  . Heart disease Mother   . Irritable bowel syndrome Mother   . Kidney disease Mother   . Other Mother        cushings disease  . Addison's disease Mother   . Heart failure Mother   . Stroke Mother   . Hypertension Mother   . Stroke Maternal Grandmother   . Hypertension Maternal Grandmother   . Colon cancer Neg Hx   . Heart attack Neg Hx     Past Medical History:  Diagnosis Date  . Adenomatous colon polyp   . Adenomatous polyp of colon 12/03/2004   42mm  . Anxiety   . Chronic headaches   . Depression   . Diverticulosis   . External hemorrhoids   . Hyperlipidemia   . Hypertension   . Hypothyroidism   . Umbilical hernia     Past Surgical History:  Procedure Laterality Date  . ABDOMINAL HYSTERECTOMY  1975  . APPENDECTOMY  1975  . COLONOSCOPY  03/03/2010,12/03/2004  . ESOPHAGOGASTRODUODENOSCOPY  12/16/11   Dr. Silvano Rusk  . OOPHORECTOMY    . spinal lipoma removed  2006  . TOTAL ABDOMINAL HYSTERECTOMY      Current Outpatient Medications  Medication Sig Dispense Refill  . ALPRAZolam (XANAX) 0.5 MG tablet Take 0.25-0.5 mg by mouth 2 (two) times daily. Pt takes a  full tab at night and whole a tab during day    . amLODipine (NORVASC) 5 MG tablet Take 1 tablet (5 mg total) by mouth daily. 90 tablet 1  . aspirin 81 MG tablet Take 81 mg by  mouth daily.     Marland Kitchen atorvastatin (LIPITOR) 10 MG tablet TAKE 1 TABLET BY MOUTH EVERY DAY 90 tablet 2  . bacitracin ophthalmic ointment Place 1 application into both eyes as needed. Dry eyes  0  . estrogens, conjugated, (PREMARIN) 0.45 MG tablet Take 0.45 mg by mouth daily.     . hydrochlorothiazide (MICROZIDE) 12.5 MG capsule TAKE 1 CAPSULE (12.5 MG TOTAL) BY MOUTH DAILY. 90 capsule 1  . losartan (COZAAR) 50 MG tablet Take 1 tablet (50 mg total) by mouth 2 (two) times daily. 180 tablet 2  . LOTEMAX 0.5 % ophthalmic suspension Place 1 drop into both eyes daily. As needed. For dry eyes    . magnesium hydroxide (MILK OF MAGNESIA) 400 MG/5ML suspension Take by mouth daily as needed for moderate constipation.     . metoprolol succinate (TOPROL-XL) 25 MG 24 hr tablet TAKE 1/2 TABLET EVERY DAY 30 tablet 4  . Multiple Vitamin (MULTIVITAMIN) capsule Take 1 capsule by mouth daily.    . Omega-3 Fatty Acids (FISH OIL PO) Take 1 tablet by mouth daily.    . polyethylene glycol (MIRALAX / GLYCOLAX) packet Take 17 g by mouth daily as needed.    Marland Kitchen SYNTHROID 112 MCG tablet Take 112 mcg by mouth daily. Takes 112 mg daily except Saturday a half tab and Sunday no pill  1  . traZODone (DESYREL) 100 MG tablet Take 100 mg by mouth at bedtime.   0  . Wheat Dextrin (BENEFIBER DRINK MIX PO) Take by mouth. Two teaspoons daily     No current facility-administered medications for this visit.     Allergies as of 10/12/2018 - Review Complete 10/12/2018  Allergen Reaction Noted  . Iodinated diagnostic agents Hives 12/18/2012  . Benazepril Cough 05/15/2015  . Hydrocodone-acetaminophen  02/12/2010  . Sulfonamide derivatives  07/22/2008  . Hydrocodone-acetaminophen Rash 06/25/2013    Vitals: BP 127/77   Pulse 97   Ht 5' 4.5" (1.638 m)   Wt 147 lb  (66.7 kg)   BMI 24.84 kg/m  Last Weight:  Wt Readings from Last 1 Encounters:  10/12/18 147 lb (66.7 kg)   HEN:IDPO mass index is 24.84 kg/m.     Last Height:   Ht Readings from Last 1 Encounters:  10/12/18 5' 4.5" (1.638 m)    Physical exam:  General: The patient is awake, alert and appears not in acute distress. The patient is well groomed. Head: Normocephalic, atraumatic. Neck is supple. Mallampati 3,  neck circumference:14. Nasal airflow patent ,  Cardiovascular:  Regular rate and rhythm without  murmurs or carotid bruit, and without distended neck veins. Respiratory: Lungs are clear to auscultation. Skin:  Without evidence of edema, or rash Trunk: BMI is low . The patient's posture is erect  Neurologic exam : The patient is awake and alert, oriented to place and time.   Memory subjective described as intact.   Attention span & concentration ability appears normal.  Speech is fluent,  without  Dysarthria,mild tremulous  dysphonia -not  aphasia.  Mood and affect are anxious. Cranial nerves: Pupils are equal and briskly reactive to light. Funduscopic exam without evidence of pallor or edema. Status post cataract.  Extraocular movements  in vertical and horizontal planes intact and without nystagmus. Visual fields by finger perimetry are intact. Hearing to finger rub intact.  Facial sensation intact to fine touch. Facial motor strength is symmetric and tongue and uvula move midline. Shoulder shrug was symmetrical.   Motor exam: tremulous muscle tone,  muscle bulk and symmetric strength in all extremities.  Sensory:  Fine touch, pinprick and vibration were tested in all extremities. Proprioception tested in the upper extremities was normal.  Coordination: Rapid alternating movements in the fingers/hands was normal. Finger-to-nose maneuver  normal without evidence of ataxia, dysmetria but mild  tremor.  Gait and station: Patient walks without assistive device and is able  unassisted to climb up to the exam table.  Strength within normal limits.  Stance is stable and normal.  Toe and heel stand were tested . Tandem gait is unfragmented. Turns with  3 Steps. Romberg testing is  negative.  Deep tendon reflexes: in the  upper and lower extremities are symmetric and intact. Babinski maneuver response is  downgoing.    Assessment:  After physical and neurologic examination, review of laboratory studies,  Personal review of imaging studies, reports of other /same  Imaging studies, results of polysomnography and / or neurophysiology testing and pre-existing records as far as provided in visit., my assessment is   1) I strongly wonder if her thyroid function is cause of her insomnia and the recent exacerbation- she appears clinically to have hyperthyroidism.  She is jittery, has an increased muscle tone and is restless. Almost hypomanic, but in a very non -euphoric way, just feeling under pressure.   2) anxiety- I can not been sure about the baseline of anxiety or depression pression, but unsure that weeks and months of sleep deprivation have exacerbated the current picture.  We will discussed the adjustment of some medications.  I am usually in favor of trazodone as a nonaddictive sleep aid, but if trazodone interferes with other medications either for thyroid control or for depression and it may be better to replace it.  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    The patient was advised of the nature of the diagnosed disorder , the treatment options and the  risks for general health and wellness arising from not treating the condition.   I spent more than 60 minutes of face to face time with the patient.  Greater than 50% of time was spent in counseling and coordination of care. We have discussed the diagnosis and differential and I answered the patient's questions.    Plan:  Treatment plan and additional workup :    Lets  check not TSH, But Free T4 and T3 and Thyroid-globulin.   Larey Seat, MD 36/46/8032, 12:24 AM  Certified in Neurology by [employer]BP[employer] Certified in [place] by [facility] [address] [place], [place] [address]

## 2018-10-13 LAB — TP+TSH+T4F+T3FREE
FREE T4: 1.86 ng/dL — AB (ref 0.82–1.77)
Free Thyroxine Index: 3.9 (ref 1.2–4.9)
T3 FREE: 2.7 pg/mL (ref 2.0–4.4)
T3 UPTAKE RATIO: 27 % (ref 24–39)
T4 TOTAL: 14.5 ug/dL — AB (ref 4.5–12.0)
TSH: 0.987 u[IU]/mL (ref 0.450–4.500)

## 2018-10-19 ENCOUNTER — Ambulatory Visit: Payer: Medicare Other | Admitting: Physician Assistant

## 2018-10-19 ENCOUNTER — Telehealth: Payer: Self-pay | Admitting: Cardiology

## 2018-10-19 ENCOUNTER — Encounter: Payer: Self-pay | Admitting: Physician Assistant

## 2018-10-19 ENCOUNTER — Telehealth: Payer: Self-pay | Admitting: Neurology

## 2018-10-19 VITALS — BP 130/80 | HR 64 | Ht 64.5 in | Wt 145.7 lb

## 2018-10-19 DIAGNOSIS — R002 Palpitations: Secondary | ICD-10-CM | POA: Diagnosis not present

## 2018-10-19 DIAGNOSIS — E785 Hyperlipidemia, unspecified: Secondary | ICD-10-CM

## 2018-10-19 DIAGNOSIS — G4700 Insomnia, unspecified: Secondary | ICD-10-CM

## 2018-10-19 DIAGNOSIS — I10 Essential (primary) hypertension: Secondary | ICD-10-CM | POA: Diagnosis not present

## 2018-10-19 DIAGNOSIS — E039 Hypothyroidism, unspecified: Secondary | ICD-10-CM

## 2018-10-19 NOTE — Patient Instructions (Addendum)
Medication Instructions:  Almyra Deforest, PA has recommended making the following medication changes: 1. TAKE Metoprolol at night  If you need a refill on your cardiac medications before your next appointment, please call your pharmacy.   Follow-Up: Keep your follow-up appointment with Dr Stanford Breed in February.

## 2018-10-19 NOTE — Progress Notes (Signed)
Cardiology Office Note    Date:  10/21/2018   ID:  Debbie Wells, DOB Oct 09, 1941, MRN [id number]  PCP:  [name], MD  Cardiologist:  Dr. Stanford Breed   No chief complaint on file.   History of Present Illness:  Debbie Wells is a 77 y.o. female with PMH of HTN, HLD, hypothyroidism and history of depression.  She had a history of difficult to control hypertension.  She previously 124-hour blood pressure monitor which showed wide swings of the blood pressure.  She had a 24-hour urine collection for metanephrine which was normal.  She was also ruled out of Cushing's syndrome by her primary care doctor.  Myoview in February 2014 showed no evidence of ischemia, EF 60%, no wall motion abnormality.  Echocardiogram obtained on 01/10/2013 showed EF 60 to 65%, grade 1 DD.  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  She has chronic pain in the left epigastrium and left upper quadrant and has seen by vascular surgery in the past to evaluate for possible mesenteric and celiac artery stenosis.  Suspicion for significant mesenteric ischemia is low.  She was seen by Dr. Stanford Breed on 09/13/2018, her blood pressure was elevated at the time however her home blood pressure cuff was 30 points higher than the office, therefore it was recommended for the patient to obtain a new blood pressure cuff.  She was seen back by Debbie Ransom, PA-C on 09/27/2018, her blood pressure remain elevated and she had dizziness and weakness.  Amlodipine 5 mg daily was added to her medical regimen.  Patient presents today with concern of pounding sensation in her chest and a throbbing sensation in her year recently.  This always occurs at night after she wake up in the middle the night.  She continues to struggle with insomnia.  She only sleep for roughly 2 hours even on the trazodone and benzodiazepine.  She has had difficulty maintaining sleep.  When she wake up she feels like her heart is pounding.  She  feels a throbbing sensation in her ear at the same time.  She denies any sensation of irregular rhythm.  She says her heart rate is quite regular however she just noticed it more.  I recommend for her to switch her morning metoprolol to nighttime.  I suspect when she wake up in the middle the night, they increased release of catecholamine from adrenal gland likely triggered the pounding sensation.  EKG today showed a sinus rhythm without evidence of atrial fibrillation or atrial flutter.  I would recommend a rate control for now, however if her symptom persist, she always have the option of taking an additional half a tablet of metoprolol on a as needed basis.  So far, despite occasional pounding sensation in her chest, she never had a heart rate above 100 bpm.  If her symptom persist, we may have to consider a heart monitor, however at this time, my suspicion is her pounding sensation is more related to her insomnia and the fact that she would keep waking up in the middle of the night.   Past Medical History:  Diagnosis Date  . Adenomatous colon polyp   . Adenomatous polyp of colon 12/03/2004   28mm  . Anxiety   . Chronic headaches   . Depression   . Diverticulosis   . External hemorrhoids   . Hyperlipidemia   . Hypertension   . Hypothyroidism   . Umbilical hernia  Past Surgical History:  Procedure Laterality Date  . ABDOMINAL HYSTERECTOMY  1975  . APPENDECTOMY  1975  . COLONOSCOPY  03/03/2010,12/03/2004  . ESOPHAGOGASTRODUODENOSCOPY  12/16/11   Dr. Silvano Rusk  . OOPHORECTOMY    . spinal lipoma removed  2006  . TOTAL ABDOMINAL HYSTERECTOMY      Current Medications: Outpatient Medications Prior to Visit  Medication Sig Dispense Refill  . ALPRAZolam (XANAX) 0.5 MG tablet Take 0.25-0.5 mg by mouth 2 (two) times daily. Pt takes a full tab at night and whole a tab during day    . amLODipine (NORVASC) 5 MG tablet Take 1 tablet (5 mg total) by mouth daily. 90 tablet 1  . aspirin 81 MG  tablet Take 81 mg by mouth daily.     Marland Kitchen atorvastatin (LIPITOR) 10 MG tablet TAKE 1 TABLET BY MOUTH EVERY DAY 90 tablet 2  . bacitracin ophthalmic ointment Place 1 application into both eyes as needed. Dry eyes  0  . estrogens, conjugated, (PREMARIN) 0.45 MG tablet Take 0.45 mg by mouth daily.     . hydrochlorothiazide (MICROZIDE) 12.5 MG capsule TAKE 1 CAPSULE (12.5 MG TOTAL) BY MOUTH DAILY. 90 capsule 1  . losartan (COZAAR) 50 MG tablet Take 1 tablet (50 mg total) by mouth 2 (two) times daily. 180 tablet 2  . LOTEMAX 0.5 % ophthalmic suspension Place 1 drop into both eyes daily. As needed. For dry eyes    . magnesium hydroxide (MILK OF MAGNESIA) 400 MG/5ML suspension Take by mouth daily as needed for moderate constipation.     . metoprolol succinate (TOPROL-XL) 25 MG 24 hr tablet TAKE 1/2 TABLET EVERY DAY 30 tablet 4  . Multiple Vitamin (MULTIVITAMIN) capsule Take 1 capsule by mouth daily.    . Omega-3 Fatty Acids (FISH OIL PO) Take 1 tablet by mouth daily.    . polyethylene glycol (MIRALAX / GLYCOLAX) packet Take 17 g by mouth daily as needed.    . sertraline (ZOLOFT) 25 MG tablet Take 1 tablet (25 mg total) by mouth daily. 30 tablet 5  . SYNTHROID 112 MCG tablet Take 112 mcg by mouth daily. Takes 112 mg daily except Saturday a half tab and Sunday no pill  1  . traZODone (DESYREL) 100 MG tablet Take 100 mg by mouth at bedtime.   0  . Wheat Dextrin (BENEFIBER DRINK MIX PO) Take by mouth. Two teaspoons daily     No facility-administered medications prior to visit.      Allergies:   Iodinated diagnostic agents; Benazepril; Hydrocodone-acetaminophen; Sulfonamide derivatives; and Hydrocodone-acetaminophen   Social History   Socioeconomic History  . Marital status: Married    Spouse name: Not on file  . Number of children: 2  . Years of education: Not on file  . Highest education level: Not on file  Occupational History  . Occupation: retired  Scientific laboratory technician  . Financial resource strain:  Not on file  . Food insecurity:    Worry: Not on file    Inability: Not on file  . Transportation needs:    Medical: Not on file    Non-medical: Not on file  Tobacco Use  . Smoking status: Former Smoker    Types: Cigarettes    Last attempt to quit: 01/23/1993    Years since quitting: 25.7  . Smokeless tobacco: Never Used  Substance and Sexual Activity  . Alcohol use: Yes    Alcohol/week: 2.0 standard drinks    Types: 2 Glasses of wine per week  .  Drug use: No  . Sexual activity: Not on file  Lifestyle  . Physical activity:    Days per week: Not on file    Minutes per session: Not on file  . Stress: Not on file  Relationships  . Social connections:    Talks on phone: Not on file    Gets together: Not on file    Attends religious service: Not on file    Active member of club or organization: Not on file    Attends meetings of clubs or organizations: Not on file    Relationship status: Not on file  Other Topics Concern  . Not on file  Social History Narrative   Patient is married she is retired and has 2 children   About 2 alcoholic beverages a week no drugs, no current tobacco she is a former smoker     Family History:  The patient's family history includes Addison's disease in her mother; Esophageal cancer in her father; Heart disease in her mother; Heart failure in her mother; Hypertension in her maternal grandmother and mother; Irritable bowel syndrome in her mother; Kidney disease in her mother; Other in her mother; Stomach cancer in her father; Stroke in her maternal grandmother and mother.   ROS:   Please see the history of present illness.    ROS All other systems reviewed and are negative.   PHYSICAL EXAM:   VS:  BP 130/80   Pulse 64   Ht 5' 4.5" (1.638 m)   Wt 145 lb 11.2 oz (66.1 kg)   BMI 24.62 kg/m    GEN: Well nourished, well developed, in no acute distress  HEENT: normal  Neck: no JVD, carotid bruits, or masses Cardiac: RRR; no murmurs, rubs, or  gallops,no edema  Respiratory:  clear to auscultation bilaterally, normal work of breathing GI: soft, nontender, nondistended, + BS MS: no deformity or atrophy  Skin: warm and dry, no rash Neuro:  Alert and Oriented x 3, Strength and sensation are intact Psych: euthymic mood, full affect  Wt Readings from Last 3 Encounters:  10/19/18 145 lb 11.2 oz (66.1 kg)  10/12/18 147 lb (66.7 kg)  09/27/18 143 lb 12.8 oz (65.2 kg)      Studies/Labs Reviewed:   EKG:  EKG is ordered today.  The ekg ordered today demonstrates NSR without significant ST-T wave changes   Recent Labs: 10/12/2018: TSH 0.987   Lipid Panel No results found for: CHOL, TRIG, HDL, CHOLHDL, VLDL, LDLCALC, LDLDIRECT  Additional studies/ records that were reviewed today include:   Echo 01/10/2013 LV EF: 60% -  65% Study Conclusions  - Left ventricle: The cavity size was normal. Wall thickness was normal. Systolic function was normal. The estimated ejection fraction was in the range of 60% to 65%. Wall motion was normal; there were no regional wall motion abnormalities. Doppler parameters are consistent with abnormal left ventricular relaxation (grade 1 diastolic dysfunction). - Atrial septum: No defect or patent foramen ovale was identified.    ASSESSMENT:    1. Palpitation   2. Essential hypertension   3. Hyperlipidemia, unspecified hyperlipidemia type   4. Hypothyroidism, unspecified type   5. Insomnia, unspecified type      PLAN:  In order of problems listed above:  1. Palpitation: She described her palpitation not as a rapid heart rate but more as pounding sensation when she suddenly wake up in the middle of the night.  It is accompanied by pulsation of her eardrum as well.  This  always occur at night after she wake up.  I think this is related to her insomnia due to catecholamine release.  I recommend for her to take her metoprolol at night.  I will hold off on a heart monitor at this  time.  2. Insomnia: She has been dealing with this for a long time.  She is seeing a sleep specialist.  She has no problem falling asleep, however she has significant issues staying asleep.  She is on high-dose trazodone, however despite so, can only sleep about 2 hours before she have to wake up and night  3. Hypertension: Blood pressure stable on current therapy  4. Hyperlipidemia: Continue Lipitor 10 mg daily  5. Hypothyroidism: Managed by primary care provider.  Her husband is concerned that she is taking too much thyroid medication, her free T3 is actually normal.    Medication Adjustments/Labs and Tests Ordered: Current medicines are reviewed at length with the patient today.  Concerns regarding medicines are outlined above.  Medication changes, Labs and Tests ordered today are listed in the Patient Instructions below. Patient Instructions  Medication Instructions:  Debbie Deforest, PA has recommended making the following medication changes: 1. TAKE Metoprolol at night  If you need a refill on your cardiac medications before your next appointment, please call your pharmacy.   Follow-Up: Keep your follow-up appointment with Dr Stanford Breed in February.    Debbie Wells, [place]  10/21/2018 11:38 PM    [facility] [place], [place],   [address] Phone: [phone]; Fax: [phone]

## 2018-10-19 NOTE — Telephone Encounter (Signed)
Pt thought at last appt Dr D was going to contact [name] to discuss her issues. Pt has not heard anything at this time. Please call to advise. Pt also wanted Dr D to know she is waking up at around 3:30 with her heart racing. She is going to contact her cardiologist for this.

## 2018-10-19 NOTE — Telephone Encounter (Signed)
Spoke with pt, the palpitations only occur at night. She took an extra metoprolol last night and everything settled down. She was having the same type of problem when she saw luke. She has been having sleep problems for months now and just started taking her bp and pulse at night and did not realize the pulse was elevated. She is not sure what to do. Follow up scheduled with hao meng pa today.

## 2018-10-19 NOTE — Telephone Encounter (Signed)
New message   STAT if HR is under 50 or over 120 (normal HR is 60-100 beats per minute)  1) What is your heart rate? Last night when pt woke up-95 pt said this has happened a few times and woke her up from sleeping and she saw Kerin Ransom and had medication changed. She wants to know if there is anything else she can do.  2) Do you have a log of your heart rate readings (document readings)? 66, 64, 71, 71, 63, 70, 62-different times during the day. On the 18th woke up with heart racing-90. 4:30 this morning-95 at 7:30-80  3) Do you have any other symptoms? Pt said she is not sleeping.

## 2018-10-19 NOTE — Telephone Encounter (Signed)
Spoke with patient regarding her heart rate. Patient stated she has been waking up in the middle of the night for several months and unable to go back to sleep Patient has felt her heart racing but never checked HR.Marland Kitchen Not sure if fast heart rate is every night but feels it 1-2 times a week.  She has checked it couple times recently when she wakes up and blood pressure 163/85 HR 90 and 142/75 HR 93. She can feel heart racing even in her "ears, just does not feel right". Patient does see [name] and her thyroid has been off and medication changes have been made. Her last thyroid check was normal and has been for about 3 months.  Will forward to Dr Stanford Breed for review

## 2018-10-19 NOTE — Telephone Encounter (Signed)
I already spoke to the patient by phone and explained to her that she had abnormal T3 and T4 hormone levels while her TSH was normal.  I think Dr. Forde Dandy had tried to keep the TSH extra low in order to  control the other to hormone.

## 2018-10-19 NOTE — Telephone Encounter (Signed)
App ov Meya Clutter  

## 2018-10-21 ENCOUNTER — Encounter: Payer: Self-pay | Admitting: Physician Assistant

## 2018-11-03 ENCOUNTER — Other Ambulatory Visit: Payer: Self-pay | Admitting: Neurology

## 2018-11-08 ENCOUNTER — Ambulatory Visit: Payer: Medicare Other | Admitting: Neurology

## 2018-11-08 ENCOUNTER — Encounter: Payer: Self-pay | Admitting: Neurology

## 2018-11-08 VITALS — BP 141/69 | HR 98 | Ht 64.5 in | Wt 142.0 lb

## 2018-11-08 DIAGNOSIS — E0590 Thyrotoxicosis, unspecified without thyrotoxic crisis or storm: Secondary | ICD-10-CM

## 2018-11-08 DIAGNOSIS — R946 Abnormal results of thyroid function studies: Secondary | ICD-10-CM | POA: Diagnosis not present

## 2018-11-08 DIAGNOSIS — G4701 Insomnia due to medical condition: Secondary | ICD-10-CM | POA: Diagnosis not present

## 2018-11-08 MED ORDER — QUETIAPINE FUMARATE 25 MG PO TABS: 25.0000 mg | ORAL_TABLET | Freq: Every day | ORAL | 2 refills | Status: DC

## 2018-11-08 NOTE — Progress Notes (Signed)
SLEEP [facility]INE [facility]IC   Provider:  Larey Seat, M D  Primary Care Physician:  [name], MD   Referring Provider: [name], MD   Chief Complaint  Patient presents with  . New Patient (Initial Visit)    pt with husband, seen recently for sleep and completed sleep study. here today for the other issue she was having which is numbness and tingling in bilateral feet and nightly numb. she doesnt feel as aware of this at daytime as she is at night. she states that this started around the same time she started having tyroid and sleep problems at 3 mths ago . states sleep difficulties are unchanged.     HPI:  Debbie Wells is a 78 y.o. female patient  , seen here on 10-12-2018  in a referral from Dr. 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  The patient was referred to Cardiology for lower extremity pain,  The circulation was fine.  Her thyroid function has been high, her TSH was very low for years and in May 2019 , after had [name] adjusted the dose-the TSH  increased to 2.2 from previously 0.18 (!) and 0.68 since 2014.  At the time her TSH was 0.18 she had been cold, reportedly had bradycardia, and was reporting low energy. She felt jittery , too- unexpectedly for a high thyroid function. She developed also numbness in her legs- which has improved over the last 2 month.   The increase in TSH was likely taking place at the same time during which  her insomnia became more prominent, an this has gotten worse.   Chief complaint according to patient :" I can't sleep, I am jittery"  Sleep habits are as follows: goes to bed at 11 o clock and can go to sleep but not stay asleep- wakes about 2 -3 hours later, stays awake until she gets up to get  Second dose, such as benadryl, Xanax.On a 14 day routine right now. averaging 5 hours of sleep all together, not able to nap   Sleep medical history: 09/16/2018 - HTN peaked, started Amlodipine.  She just recieved a diagnosis of  hyponatremia. Long standing "hyperthyroidism" acc. to low TSH.  Family sleep history:  Mother had MEN, multiple endocrine neoplasia . Parathyroid disease. She had kidney disease.  Mother also have suffered from Cushing's disease resulting in Addison's disease, hypertension which has affected not just mother but also maternal grandmother and maternal aunts.  2 maternal aunts have suffered from breast cancer, a paternal uncle took his own life after a struggle with severe depression.  Social history: married. Retired. Non smoker, non drinker, caffeine : coffee in early AM, no sodas, no caffeinated iced tea.  Interval history : Summarized I have the pleasure of seeing Debbie Wells. Rake today in the presence of her husband about 3 weeks after we met last.  Today is 08 November 2018 the patient has a quite significant abnormality on her thyroid comprehensive panel.  The free T4 hormone was elevated at 1.86 with total T4 was elevated at 14.5 mcg/dL her T3 uptake was normal her free thyroxine Was normal and her free T3 hormone was in normal range her TSH is now in normal range after years of being too low but it came at the upper on the account of a very high T4.  Her symptoms are reflective of hyper thyroidism.  She feels jittery anxious hypervigilant has trouble to sleep has almost unformed stools, she feels also weaker.  She has paresthesias that affects the lower limbs mostly and I think this may also be related to the thyroid disease I actually think this is the only explanation.   She has burning foot  pain and numbness, not longer leg pain.    Review of Systems: Out of a complete 14 system review, the patient complains of only the following symptoms, and all other reviewed systems are negative. Constant constipation, nausea but no vomiting. Mesenteric arterial stenosis, due to acuate ligament ( Dr. Carlean Purl).  Explaining the liquid diarrhea?.  Epworth Sleepiness score still 5/24 points  , Fatigue severity  score 58/63  , depression score n/a- she is tearful.     Social History   Socioeconomic History  . Marital status: Married    Spouse name: Not on file  . Number of children: 2  . Years of education: Not on file  . Highest education level: Not on file  Occupational History  . Occupation: retired  Scientific laboratory technician  . Financial resource strain: Not on file  . Food insecurity:    Worry: Not on file    Inability: Not on file  . Transportation needs:    Medical: Not on file    Non-medical: Not on file  Tobacco Use  . Smoking status: Former Smoker    Types: Cigarettes    Last attempt to quit: 01/23/1993    Years since quitting: 25.8  . Smokeless tobacco: Never Used  Substance and Sexual Activity  . Alcohol use: Yes    Alcohol/week: 2.0 standard drinks    Types: 2 Glasses of wine per week  . Drug use: No  . Sexual activity: Not on file  Lifestyle  . Physical activity:    Days per week: Not on file    Minutes per session: Not on file  . Stress: Not on file  Relationships  . Social connections:    Talks on phone: Not on file    Gets together: Not on file    Attends religious service: Not on file    Active member of club or organization: Not on file    Attends meetings of clubs or organizations: Not on file    Relationship status: Not on file  . Intimate partner violence:    Fear of current or ex partner: Not on file    Emotionally abused: Not on file    Physically abused: Not on file    Forced sexual activity: Not on file  Other Topics Concern  . Not on file  Social History Narrative   Patient is married she is retired and has 2 children   About 2 alcoholic beverages a week no drugs, no current tobacco she is a former smoker    Family History  Problem Relation Age of Onset  . Esophageal cancer Father   . Stomach cancer Father        mets from esophagus  . Heart disease Mother   . Irritable bowel syndrome Mother   . Kidney disease Mother   . Other Mother         cushings disease  . Addison's disease Mother   . Heart failure Mother   . Stroke Mother   . Hypertension Mother   . Stroke Maternal Grandmother   . Hypertension Maternal Grandmother   . Colon cancer Neg Hx   . Heart attack Neg Hx     Past Medical History:  Diagnosis Date  . Adenomatous colon polyp   . Adenomatous polyp of colon  12/03/2004   56m  . Anxiety   . Chronic headaches   . Depression   . Diverticulosis   . External hemorrhoids   . Hyperlipidemia   . Hypertension   . Hypothyroidism   . Umbilical hernia     Past Surgical History:  Procedure Laterality Date  . ABDOMINAL HYSTERECTOMY  1975  . APPENDECTOMY  1975  . COLONOSCOPY  03/03/2010,12/03/2004  . ESOPHAGOGASTRODUODENOSCOPY  12/16/11   Dr. CSilvano Rusk . OOPHORECTOMY    . spinal lipoma removed  2006  . TOTAL ABDOMINAL HYSTERECTOMY      Current Outpatient Medications  Medication Sig Dispense Refill  . ALPRAZolam (XANAX) 0.5 MG tablet Take 0.25-0.5 mg by mouth 2 (two) times daily. Pt takes a full tab at night and whole a tab during day    . amLODipine (NORVASC) 5 MG tablet Take 1 tablet (5 mg total) by mouth daily. 90 tablet 1  . aspirin 81 MG tablet Take 81 mg by mouth daily.     .Marland Kitchenatorvastatin (LIPITOR) 10 MG tablet TAKE 1 TABLET BY MOUTH EVERY DAY 90 tablet 2  . bacitracin ophthalmic ointment Place 1 application into both eyes as needed. Dry eyes  0  . estrogens, conjugated, (PREMARIN) 0.45 MG tablet Take 0.45 mg by mouth daily.     . hydrochlorothiazide (MICROZIDE) 12.5 MG capsule TAKE 1 CAPSULE (12.5 MG TOTAL) BY MOUTH DAILY. 90 capsule 1  . losartan (COZAAR) 50 MG tablet Take 1 tablet (50 mg total) by mouth 2 (two) times daily. 180 tablet 2  . LOTEMAX 0.5 % ophthalmic suspension Place 1 drop into both eyes daily. As needed. For dry eyes    . magnesium hydroxide (MILK OF MAGNESIA) 400 MG/5ML suspension Take by mouth daily as needed for moderate constipation.     . metoprolol succinate (TOPROL-XL) 25 MG 24 hr  tablet TAKE 1/2 TABLET EVERY DAY 30 tablet 4  . Multiple Vitamin (MULTIVITAMIN) capsule Take 1 capsule by mouth daily.    . Omega-3 Fatty Acids (FISH OIL PO) Take 1 tablet by mouth daily.    . polyethylene glycol (MIRALAX / GLYCOLAX) packet Take 17 g by mouth daily as needed.    . sertraline (ZOLOFT) 25 MG tablet TAKE 1 TABLET BY MOUTH EVERY DAY 90 tablet 1  . SYNTHROID 75 MCG tablet Take 75 mcg by mouth daily. Takes 112 mg daily except Saturday a half tab and Sunday no pill  1  . traZODone (DESYREL) 100 MG tablet Take 100 mg by mouth at bedtime.   0  . Wheat Dextrin (BENEFIBER DRINK MIX PO) Take by mouth. Two teaspoons daily     No current facility-administered medications for this visit.     Allergies as of 11/08/2018 - Review Complete 11/08/2018  Allergen Reaction Noted  . Iodinated diagnostic agents Hives 12/18/2012  . Benazepril Cough 05/15/2015  . Hydrocodone-acetaminophen  02/12/2010  . Sulfonamide derivatives  07/22/2008  . Hydrocodone-acetaminophen Rash 06/25/2013    Vitals: BP (!) 141/69   Pulse 98   Ht 5' 4.5" (1.638 m)   Wt 142 lb (64.4 kg)   BMI 24.00 kg/m  Last Weight:  Wt Readings from Last 1 Encounters:  11/08/18 142 lb (64.4 kg)   BZOX:WRUEmass index is 24 kg/m.     Last Height:   Ht Readings from Last 1 Encounters:  11/08/18 5' 4.5" (1.638 m)    Physical exam:  General: The patient is awake, alert and appears not in acute distress. The patient  is well groomed. Head: Normocephalic, atraumatic. Neck is supple. Mallampati 3,  neck circumference:14. Nasal airflow patent ,  Cardiovascular:  Regular rate and rhythm without  murmurs or carotid bruit, and without distended neck veins. Respiratory: Lungs are clear to auscultation. Skin:  Without evidence of edema, or rash Trunk: BMI is low . The patient's posture is erect, her body language is that of a very anxious and worried individual.   Neurologic exam : The patient is awake and alert, oriented to place  and time.   Memory subjective described as intact.   Attention span & concentration ability appears normal.  Speech is fluent,  without  Dysarthria,mild tremulous  dysphonia -not  aphasia.  Mood and affect are anxious. Cranial nerves: Pupils are equal and briskly reactive to light. Facial sensation intact to fine touch. Facial motor strength is symmetric and tongue and uvula move midline. Shoulder shrug was symmetrical.   Motor exam: tremulous muscle tone,  muscle bulk and symmetric strength in all extremities. Sensory:  Fine touch, pinprick and vibration were tested in all extremities. Coordination: Rapid alternating movements in the fingers/hands was normal. Finger-to-nose maneuver  normal without evidence of ataxia, dysmetria but mild  tremor. Gait and station: Patient walks without assistive device and is able unassisted to climb up to the exam table. Turns with  3 Steps. Romberg testing is  negative. Deep tendon reflexes: in the upper and lower extremities are symmetric and intact.   Assessment:  After physical and neurologic examination, review of laboratory studies,  Personal review of imaging studies, reports of other /same  Imaging studies, results of polysomnography and / or neurophysiology testing and pre-existing records as far as provided in visit., my assessment is   1) I strongly suspect her thyroid function is cause of her insomnia and the recent exacerbation- she appears clinically to have hyperthyroidism.  She is jittery, has an increased muscle tone and is restless. Almost hypomanic, but in a very non -euphoric way, just feeling under pressure.   2) anxiety- I can not been sure about the baseline of anxiety or depression, but unsure that weeks and months of sleep deprivation have exacerbated the current picture.  We will discussed the adjustment of some medications.  I am usually in favor of trazodone as a nonaddictive sleep aid, but if trazodone interferes with other  medications either for thyroid control or for depression and it may be better to replace it.  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 I will start her on Seroquel 25 mg at night to help her sleep.  Keep her benzodiazepines- she is too long on it to stop abruptly.  She needs to sleep to be able to undergo further testing and have meaningful tests.  I would not be able to explain her higher T4 levels and her symptoms in light of normal TSH .  The patient was advised of the nature of the diagnosed disorder , the treatment options and the  risks for general health and wellness arising from not treating the condition.   I spent more than 60 minutes of face to face time with the patient.  Greater than 50% of time was spent in counseling and coordination of care. We have discussed the diagnosis and differential and I answered the patient's questions.    Plan:  Treatment plan and additional workup: Start Seroquel 25 mg- nightly . And we will do a sleep study once sleep as been achieved.  Rv in 6 weeks.     Larey Seat, MD 02/03/6519, 7:61 PM  Certified in Neurology by ABPN Certified in [place] by [facility] [address] [place], [place] [address]

## 2018-11-08 NOTE — Patient Instructions (Signed)
Quetiapine tablets  What is this medicine?  QUETIAPINE (kwe TYE a peen) is an antipsychotic. It is used to treat schizophrenia and bipolar disorder, also known as manic-depression.  This medicine may be used for other purposes; ask your health care provider or pharmacist if you have questions.  COMMON BRAND NAME(S): Seroquel  What should I tell my health care provider before I take this medicine?  They need to know if you have any of these conditions:  -blockage in your bowel  -cataracts  -constipation  -dehydration  -diabetes  -difficulty swallowing  -glaucoma  -heart disease  -history of breast cancer  -kidney disease  -liver disease  -low blood counts, like low white cell, platelet, or red cell counts  -low blood pressure or dizziness when standing up  -Parkinson's disease  -previous heart attack  -prostate disease  -seizures  -stomach or intestine problems  -suicidal thoughts, plans or attempt; a previous suicide attempt by you or a family member  -thyroid disease  -trouble passing urine  -an unusual or allergic reaction to quetiapine, other medicines, foods, dyes, or preservatives  -pregnant or trying to get pregnant  -breast-feeding  How should I use this medicine?  Take this medicine by mouth. Swallow it with a drink of water. Follow the directions on the prescription label. If it upsets your stomach you can take it with food. Take your medicine at regular intervals. Do not take it more often than directed. Do not stop taking except on the advice of your doctor or health care professional.  A special MedGuide will be given to you by the pharmacist with each prescription and refill. Be sure to read this information carefully each time.  Talk to your pediatrician regarding the use of this medicine in children. While this drug may be prescribed for children as young as 10 years for selected conditions, precautions do apply.  Patients over age 65 years may have a stronger reaction to this medicine and need  smaller doses.  Overdosage: If you think you have taken too much of this medicine contact a poison control center or emergency room at once.  NOTE: This medicine is only for you. Do not share this medicine with others.  What if I miss a dose?  If you miss a dose, take it as soon as you can. If it is almost time for your next dose, take only that dose. Do not take double or extra doses.  What may interact with this medicine?  Do not take this medicine with any of the following medications:  -cisapride  -dofetilide  -dronedarone  -fluconazole  -metoclopramide  -pimozide  -posaconazole  -thioridazine  This medicine may also interact with the following medications:  -alcohol  -antihistamines for allergy cough and cold  -antiviral medicines for HIV or AIDS  -atropine  -certain medicines for bladder problems like oxybutynin, tolterodine  -certain medicines for blood pressure  -certain medicines for depression, anxiety, or psychotic disturbances  -certain medicines for diabetes  -certain medicines for stomach problems like dicyclomine, hyoscyamine  -certain medicines for travel sickness like scopolamine  -certain medicines for Parkinson's disease  -certain medicines for seizures like carbamazepine, phenobarbital, phenytoin  -cimetidine  -erythromycin  -ipratropium  -other medicines that prolong the QT interval (cause an abnormal heart rhythm)  -rifampin  -steroid medicines like prednisone or cortisone  This list may not describe all possible interactions. Give your health care provider a list of all the medicines, herbs, non-prescription drugs, or dietary supplements you use. Also   tell them if you smoke, drink alcohol, or use illegal drugs. Some items may interact with your medicine.  What should I watch for while using this medicine?  Visit your doctor or health care professional for regular checks on your progress. It may be several weeks before you see the full effects of this medicine.  Your health care provider may  suggest that you have your eyes examined prior to starting this medicine, and every 6 months thereafter.  If you have been taking this medicine regularly for some time, do not suddenly stop taking it. You must gradually reduce the dose or your symptoms may get worse. Ask your doctor or health care professional for advice.  Patients and their families should watch out for worsening depression or thoughts of suicide. Also watch out for sudden or severe changes in feelings such as feeling anxious, agitated, panicky, irritable, hostile, aggressive, impulsive, severely restless, overly excited and hyperactive, or not being able to sleep. If this happens, especially at the beginning of antidepressant treatment or after a change in dose, call your health care professional.  You may get dizzy or drowsy. Do not drive, use machinery, or do anything that needs mental alertness until you know how this medicine affects you. Do not stand or sit up quickly, especially if you are an older patient. This reduces the risk of dizzy or fainting spells. Alcohol can increase dizziness and drowsiness. Avoid alcoholic drinks.  Do not treat yourself for colds, diarrhea or allergies. Ask your doctor or health care professional for advice, some ingredients may increase possible side effects.  This medicine can reduce the response of your body to heat or cold. Dress warm in cold weather and stay hydrated in hot weather. If possible, avoid extreme temperatures like saunas, hot tubs, very hot or cold showers, or activities that can cause dehydration such as vigorous exercise.  What side effects may I notice from receiving this medicine?  Side effects that you should report to your doctor or health care professional as soon as possible:  -allergic reactions like skin rash, itching or hives, swelling of the face, lips, or tongue  -changes in vision  -difficulty swallowing  -elevated mood, decreased need for sleep, racing thoughts, impulsive  behavior  -eye pain  -redness, blistering, peeling, or loosening of the skin, including inside the mouth  -restlessness, pacing, inability to keep still  -seizures  -signs and symptoms of a dangerous change in heartbeat or heart rhythm like chest pain; dizziness; fast, irregular heartbeat; palpitations; feeling faint or lightheaded; falls; breathing problems  -signs and symptoms of high blood sugar such as dizziness; dry mouth; dry skin; fruity breath; nausea; stomach pain; increased hunger; increased thirst; increased urination  -signs and symptoms of hypothyroidism like fatigue; increased sensitivity to cold; weight gain; hoarseness; thinning hair  -signs and symptoms of infection like fever; chills; cough; sore throat; pain or trouble passing urine  -signs and symptoms of low blood pressure like dizziness; feeling faint or lightheaded; falls; unusually weak or tired  -signs and symptoms of neuroleptic malignant syndrome (NMS) like confusion; fast, irregular heartbeat; high fever; increased sweating; stiff muscles  -signs and symptoms of a stroke like changes in vision; confusion; trouble speaking or understanding; severe headaches; sudden numbness or weakness of the face, arm or leg; trouble walking; dizziness; loss of balance or coordination  -signs and symptoms of tardive dyskinesia, like uncontrollable head, mouth, neck, arm, or leg movements  -suicidal thoughts, mood changes  Side effects that usually do   not require medical attention (report to your doctor or health care professional if they continue or are bothersome):  -change in sex drive or performance  -constipation  -drowsiness  -dry mouth  -upset stomach  -weight gain  This list may not describe all possible side effects. Call your doctor for medical advice about side effects. You may report side effects to FDA at 1-800-FDA-1088.  Where should I keep my medicine?  Keep out of the reach of children.  Store at room temperature between 15 and 30 degrees C  (59 and 86 degrees F). Throw away any unused medicine after the expiration date.  NOTE: This sheet is a summary. It may not cover all possible information. If you have questions about this medicine, talk to your doctor, pharmacist, or health care provider.   2019 Elsevier/Gold Standard (2017-10-07 14:16:00)

## 2018-11-13 ENCOUNTER — Telehealth: Payer: Self-pay | Admitting: Cardiology

## 2018-11-13 ENCOUNTER — Telehealth: Payer: Self-pay | Admitting: Neurology

## 2018-11-13 NOTE — Telephone Encounter (Signed)
Called the patient back to advise her that Dr Brett Fairy was happy to hear she isn't needing the medication. Advised the patient that she recommends a probiotic called align. Pt verbalized understanding and was appreciative for the call.

## 2018-11-13 NOTE — Telephone Encounter (Signed)
Patient called and requested to leave a message for Myriam Jacobson RN to return her call regarding an update on her care. She wants to speak regarding the seroquel she has not been taking it because she has been sleeping ok without the medication. She wants to speak with Myriam Jacobson to make sure this is ok. Please call and advise.

## 2018-11-13 NOTE — Telephone Encounter (Signed)
Called the patient to discuss her concerns. The patient wanted to update Dr Brett Fairy that since her visit with her she said the night after her visit she slept all night. She states that she didn't take the seroquel and so far has not felt the need to start it. She states that if Dr Brett Fairy is ok she thinks she will hold off and see if the thyroid medication is helping. She states that she took her trazadone and xanax and slept until 6 in the morning. She is thrilled that she has gotten that much sleep and would like to make sure she agrees with holding off on the seroquel. Pt questioned if she had a b12 level drawn. I advised that in the [facility] epic system I do not see where that has been drawn but advised her to check with Dr [place] [facility] office as they may have checked through his office and it doesn't show in our system.   Pt is requesting that Dr Brett Fairy let her know if she still thinks she should start a probiotic and if so what does she recommend. She states was mentioned in the office visit but then they changed subject and she wasn't sure if this is something Dr Brett Fairy wanted her on. I informed her I would pass all this on to Dr Brett Fairy and see if she recommends the patient start a probiotic and if so what she would like her to take. Will call her back

## 2018-11-13 NOTE — Telephone Encounter (Signed)
New Message       Patient is calling today to see if she should continue her "hydrochlorothiazide (MICROZIDE) 12.5 MG capsule" patient states she is not having any swelling so therefore she is checking to see if it is necessary to continue. Pls call to advise.

## 2018-11-13 NOTE — Telephone Encounter (Signed)
Spoke with pt, aware the HCTZ is also for blood pressure control. She has had a lot of adjustments made in her medications recently and wants reassurance that everything is needed. When she checks her bp at home it is usually good. At dr south's office it was 35 and she wanted to make sure that was okay. Reassurance given to the patient to continue the HCTZ and to let us know if she were to have any dizziness when her bp is low and then we may need to look at the medications closer. Patient voiced understanding and all questions answered.

## 2018-11-13 NOTE — Telephone Encounter (Signed)
I am glad that she sleeps without medication , or without the newly prescribed seroquel.  She can keep it at hand. CD

## 2018-11-14 ENCOUNTER — Institutional Professional Consult (permissible substitution): Payer: Medicare Other | Admitting: Neurology

## 2018-11-14 ENCOUNTER — Encounter

## 2018-11-15 ENCOUNTER — Other Ambulatory Visit: Payer: Self-pay | Admitting: Cardiology

## 2018-11-15 DIAGNOSIS — E785 Hyperlipidemia, unspecified: Secondary | ICD-10-CM

## 2018-11-27 NOTE — Progress Notes (Signed)
HPI: FU hypertension. She has a history of hypertension which has been somewhat difficult to control.She had previously worn an outpatient 24-hour blood pressure monitor which showed wide swings of blood pressure.She has had a 24-hour urine collection for metanephrines which was normal. She has had blood work in Dr. Jerilynn Som which has ruled out Cushing's syndrome.Her cardiac workup has included a Lexiscan Myoview stress test on 12/07/12 showing no evidence of ischemia and her ejection fraction was 60% and there were no wall motion abnormalities. She had a previous echocardiogram 09/17/11 showing normal LV function and mild mitral regurgitation. Renal dopplers 2014 showed mild 1-59 right RAS. She has had problems with chronic steady pain in the left epigastrium and left upper quadrant and has seen her gastroenterologist Dr. Carlean Purl. She has seen Dr. Mamie Nick regarding question of mesenteric and or celiac artery stenosis as a possible cause of her left upper quadrant pain. Dr. Kellie Simmering did not feel that the quality of her pain suggested mesenteric ischemia. She has also been seen by vascular surgery at [facility]. They did not feel that she would benefit from surgery. Previous cough with ACEI. Bradycardia with higher doses of beta blocker. Patient had lifeline screening previously and her carotids were normal, no aneurysm and normal ABIs.Since last seen, patient has not had dyspnea, chest pain or syncope.  She states she has had shakiness from too high of thyroid medication.  This has been adjusted and she feels better.  Current Outpatient Medications  Medication Sig Dispense Refill  . ALPRAZolam (XANAX) 0.5 MG tablet Take 0.25-0.5 mg by mouth 2 (two) times daily. Pt takes a full tab at night and whole a tab during day    . amLODipine (NORVASC) 5 MG tablet Take 1 tablet (5 mg total) by mouth daily. 90 tablet 1  . aspirin 81 MG tablet Take 81 mg by mouth daily.     Marland Kitchen atorvastatin  (LIPITOR) 10 MG tablet TAKE 1 TABLET BY MOUTH EVERY DAY 90 tablet 2  . estrogens, conjugated, (PREMARIN) 0.45 MG tablet Take 0.45 mg by mouth daily.     . hydrochlorothiazide (MICROZIDE) 12.5 MG capsule TAKE 1 CAPSULE (12.5 MG TOTAL) BY MOUTH DAILY. 90 capsule 1  . losartan (COZAAR) 50 MG tablet Take 1 tablet (50 mg total) by mouth 2 (two) times daily. 180 tablet 2  . magnesium hydroxide (MILK OF MAGNESIA) 400 MG/5ML suspension Take by mouth daily as needed for moderate constipation.     . metoprolol succinate (TOPROL-XL) 25 MG 24 hr tablet TAKE 1/2 TABLET EVERY DAY 30 tablet 4  . Multiple Vitamin (MULTIVITAMIN) capsule Take 1 capsule by mouth daily.    . Omega-3 Fatty Acids (FISH OIL PO) Take 1 tablet by mouth daily.    . QUEtiapine (SEROQUEL) 25 MG tablet TAKE 1 TABLET BY MOUTH EVERYDAY AT BEDTIME 90 tablet 1  . sertraline (ZOLOFT) 25 MG tablet TAKE 1 TABLET BY MOUTH EVERY DAY 90 tablet 1  . SYNTHROID 75 MCG tablet Take 75 mcg by mouth daily. Takes 112 mg daily except Saturday a half tab and Sunday no pill  1  . traZODone (DESYREL) 100 MG tablet Take 100 mg by mouth at bedtime.   0  . Wheat Dextrin (BENEFIBER DRINK MIX PO) Take by mouth. Two teaspoons daily    . bacitracin ophthalmic ointment Place 1 application into both eyes as needed. Dry eyes  0  . LOTEMAX 0.5 % ophthalmic suspension Place 1 drop into both eyes daily.  As needed. For dry eyes    . polyethylene glycol (MIRALAX / GLYCOLAX) packet Take 17 g by mouth daily as needed.     No current facility-administered medications for this visit.      Past Medical History:  Diagnosis Date  . Adenomatous colon polyp   . Adenomatous polyp of colon 12/03/2004   15mm  . Anxiety   . Chronic headaches   . Depression   . Diverticulosis   . External hemorrhoids   . Hyperlipidemia   . Hypertension   . Hypothyroidism   . Umbilical hernia     Past Surgical History:  Procedure Laterality Date  . ABDOMINAL HYSTERECTOMY  1975  . APPENDECTOMY   1975  . COLONOSCOPY  03/03/2010,12/03/2004  . ESOPHAGOGASTRODUODENOSCOPY  12/16/11   Dr. Silvano Rusk  . OOPHORECTOMY    . spinal lipoma removed  2006  . TOTAL ABDOMINAL HYSTERECTOMY      Social History   Socioeconomic History  . Marital status: Married    Spouse name: Not on file  . Number of children: 2  . Years of education: Not on file  . Highest education level: Not on file  Occupational History  . Occupation: retired  Scientific laboratory technician  . Financial resource strain: Not on file  . Food insecurity:    Worry: Not on file    Inability: Not on file  . Transportation needs:    Medical: Not on file    Non-medical: Not on file  Tobacco Use  . Smoking status: Former Smoker    Types: Cigarettes    Last attempt to quit: 01/23/1993    Years since quitting: 25.8  . Smokeless tobacco: Never Used  Substance and Sexual Activity  . Alcohol use: Yes    Alcohol/week: 2.0 standard drinks    Types: 2 Glasses of wine per week  . Drug use: No  . Sexual activity: Not on file  Lifestyle  . Physical activity:    Days per week: Not on file    Minutes per session: Not on file  . Stress: Not on file  Relationships  . Social connections:    Talks on phone: Not on file    Gets together: Not on file    Attends religious service: Not on file    Active member of club or organization: Not on file    Attends meetings of clubs or organizations: Not on file    Relationship status: Not on file  . Intimate partner violence:    Fear of current or ex partner: Not on file    Emotionally abused: Not on file    Physically abused: Not on file    Forced sexual activity: Not on file  Other Topics Concern  . Not on file  Social History Narrative   Patient is married she is retired and has 2 children   About 2 alcoholic beverages a week no drugs, no current tobacco she is a former smoker    Family History  Problem Relation Age of Onset  . Esophageal cancer Father   . Stomach cancer Father        mets  from esophagus  . Heart disease Mother   . Irritable bowel syndrome Mother   . Kidney disease Mother   . Other Mother        cushings disease  . Addison's disease Mother   . Heart failure Mother   . Stroke Mother   . Hypertension Mother   . Stroke Maternal Grandmother   .  Hypertension Maternal Grandmother   . Colon cancer Neg Hx   . Heart attack Neg Hx     ROS: Insomnia, lower extremity tingling, fatigue but no fevers or chills, productive cough, hemoptysis, dysphasia, odynophagia, melena, hematochezia, dysuria, hematuria, rash, seizure activity, orthopnea, PND, pedal edema, claudication. Remaining systems are negative.  Physical Exam: Well-developed well-nourished in no acute distress.  Skin is warm and dry.  HEENT is normal.  Neck is supple.  Chest is clear to auscultation with normal expansion.  Cardiovascular exam is regular rate and rhythm.  Abdominal exam nontender or distended. No masses palpated. Extremities show no edema. neuro grossly intact  A/P  1 hypertension-blood pressure is controlled.  Continue present medications and follow.  2 palpitations-continue beta-blocker.  3 hyperlipidemia-continue statin.  Lipids and liver monitored by primary care.  4 chronic abdominal pain-followed by primary care and gastroenterology.  Kirk Ruths, MD

## 2018-12-01 ENCOUNTER — Other Ambulatory Visit: Payer: Self-pay | Admitting: Neurology

## 2018-12-06 ENCOUNTER — Ambulatory Visit: Payer: Medicare Other | Admitting: Cardiology

## 2018-12-06 ENCOUNTER — Encounter: Payer: Self-pay | Admitting: Cardiology

## 2018-12-06 VITALS — BP 130/64 | HR 78 | Ht 64.5 in | Wt 145.0 lb

## 2018-12-06 DIAGNOSIS — I10 Essential (primary) hypertension: Secondary | ICD-10-CM

## 2018-12-06 DIAGNOSIS — R002 Palpitations: Secondary | ICD-10-CM | POA: Diagnosis not present

## 2018-12-06 DIAGNOSIS — E039 Hypothyroidism, unspecified: Secondary | ICD-10-CM | POA: Diagnosis not present

## 2018-12-06 NOTE — Patient Instructions (Signed)
Medication Instructions:  NO CHANGE If you need a refill on your cardiac medications before your next appointment, please call your pharmacy.   Lab work: If you have labs (blood work) drawn today and your tests are completely normal, you will receive your results only by: Marland Kitchen MyChart Message (if you have MyChart) OR . A paper copy in the mail If you have any lab test that is abnormal or we need to change your treatment, we will call you to review the results.  Follow-Up: At [facility], you and your health needs are our priority.  As part of our continuing mission to provide you with exceptional heart care, we have created designated Provider Care Teams.  These Care Teams include your primary Cardiologist (physician) and Advanced Practice Providers (APPs -  Physician Assistants and Nurse Practitioners) who all work together to provide you with the care you need, when you need it. You will need a follow up appointment in 6 months.  Please call our office 2 months in advance to schedule this appointment.  You may see Kirk Ruths, MD or one of the following Advanced Practice Providers on your designated Care Team:   Kerin Ransom, PA-C Roby Lofts, [place] . Sande Rives, PA-C  CALL IN June TO SCHEDULE APPOINTMENT IN [place]

## 2018-12-19 ENCOUNTER — Telehealth: Payer: Self-pay | Admitting: Neurology

## 2018-12-19 NOTE — Telephone Encounter (Signed)
Called and the patient was not available. LVM with the patient husband because the patient wanted to be seen sooner. I will take her off the April apt as Dr Brett Fairy will be out of the office. I have placed her on the scheudule for Thursday at 2:30 with check in at 2 pm. If the patient is not able to make the apt she will call back within the hr to cancel.  If patient calls back and states the thur apt works please advise that we will take her off of the 2/27 apt as well as April apt

## 2018-12-19 NOTE — Telephone Encounter (Signed)
Pt returned RN's call. She wanted to keep 2/27 appt. I c/a 2/20

## 2018-12-21 ENCOUNTER — Ambulatory Visit: Payer: Self-pay | Admitting: Neurology

## 2018-12-28 ENCOUNTER — Encounter: Payer: Self-pay | Admitting: Neurology

## 2018-12-28 ENCOUNTER — Ambulatory Visit: Payer: Medicare Other | Admitting: Neurology

## 2018-12-28 VITALS — BP 129/66 | HR 59 | Ht 64.5 in | Wt 145.0 lb

## 2018-12-28 DIAGNOSIS — E0590 Thyrotoxicosis, unspecified without thyrotoxic crisis or storm: Secondary | ICD-10-CM

## 2018-12-28 DIAGNOSIS — E079 Disorder of thyroid, unspecified: Secondary | ICD-10-CM

## 2018-12-28 DIAGNOSIS — G63 Polyneuropathy in diseases classified elsewhere: Secondary | ICD-10-CM

## 2018-12-28 DIAGNOSIS — R251 Tremor, unspecified: Secondary | ICD-10-CM

## 2018-12-28 DIAGNOSIS — I10 Essential (primary) hypertension: Secondary | ICD-10-CM

## 2018-12-28 DIAGNOSIS — R946 Abnormal results of thyroid function studies: Secondary | ICD-10-CM

## 2018-12-28 NOTE — Addendum Note (Signed)
Addended by: Larey Seat on: 12/28/2018 03:04 PM   Modules accepted: Orders

## 2018-12-28 NOTE — Patient Instructions (Signed)
Your physician has ordered a Nerve Conduction Study (NCV) and/or EMG testing.  This is a test to assess the status of your nerves and muscles. For the NCV portion of the test , sticky tabs will be placed on either your hands or feet.  Your nerves will be stimulated using small electrical charges and the speed of the impulse will be measured as it travels down the nerve. For the EMG portion of the test, a small pin will be placed below the surface of the skin to measure the electrical activity in certain muscles in your arms or legs. Eating/drinking prior to testing is ok.  Please DO NOT discontinue ANY medications prior to testing  Your appointment will be scheduled by a member of our team.  You will need to check in with the main reception desk. Your test could take approximately 30 minutes to 1 hour to complete depending on the extent of the test that has been ordered. TESTING ON LEGS/BACK/HIP/FOOT AREA: Bring/wear a pair of shorts.  **ABSOLUTELY NO LOTIONS, MOISTURIZERS, VASELINE, OILS OR CREAMS OF ANY KIND ON ANY BODY PART THE DAY OF THE TEST**  **DEODORANT IS OK TO WEAR**  Please make every effort to keep your scheduled appointment time, as your physician needs the test results prior to your next appointment.  If you have to cancel or reschedule, please do so as soon as possible, so that another patient may use that testing time slot.  If you cancel your appointment, you may also need to reschedule your referring doctor's appointment until the test and results can be completed.  Please call [phone] and ask for Dr. Newton's assistant if you need to do so.  If you have any questions at any time please let us know.  We look forward to working with you!! 

## 2018-12-28 NOTE — Progress Notes (Signed)
[facility]   Provider:  Larey Seat, M.D.   Primary Care Physician:  [name], MD  Referring Provider: [name], MD   Chief Complaint  Patient presents with  . Follow-up    pt with husband, pt states things are going better.     HPI:  Debbie Wells is a 78 y.o. female patient , seen here on 10-12-2018  in a referral from Dr. 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  The patient was referred to Cardiology for lower extremity pain,  The circulation was fine.  Her thyroid function has been high, her TSH was very low for years and in May 2019 , after had [name] adjusted the dose-the TSH  increased to 2.2 from previously 0.18 (!) and 0.68 since 2014.  At the time her TSH was 0.18 she had been cold, reportedly had bradycardia, and was reporting low energy. She felt jittery , too- unexpectedly for a high thyroid function. She developed also numbness in her legs- which has improved over the last 2 month.   The increase in TSH was likely taking place at the same time during which  her insomnia became more prominent, an this has gotten worse.   Chief complaint according to patient :" I can't sleep, I am jittery"  Sleep habits are as follows: goes to bed at 11 o clock and can go to sleep but not stay asleep- wakes about 2 -3 hours later, stays awake until she gets up to get  Second dose, such as benadryl, Xanax.On a 14 day routine right now. averaging 5 hours of sleep all together, not able to nap   Sleep medical history: 09/16/2018 - HTN peaked, started Amlodipine.  She just recieved a diagnosis of hyponatremia. Long standing "hyperthyroidism" acc. to low TSH.  Family sleep history:  Mother had MEN, multiple endocrine neoplasia . Parathyroid disease. She had kidney disease.  Mother also have suffered from Cushing's disease resulting in Addison's disease, hypertension which has affected not just mother but also maternal grandmother and maternal aunts.  2 maternal  aunts have suffered from breast cancer, a paternal uncle took his own life after a struggle with severe depression.  Social history: married. Retired. Non smoker, non drinker, caffeine : coffee in early AM, no sodas, no caffeinated iced tea.  Interval history : Summarized I have the pleasure of seeing Debbie Pendergrass. Wells today in the presence of her husband about 3 weeks after we met last.   Today is 08 November 2018 the patient has a quite significant abnormality on her thyroid comprehensive panel.  The free T4 hormone was elevated at 1.86 with total T4 was elevated at 14.5 mcg/dL her T3 uptake was normal her free thyroxine was normal and her free T3 hormone was in normal range her TSH is now in normal range after years of being too low but it came at the upper on the account of a very high T4.  Her symptoms are reflective of hyper thyroidism.  She feels jittery , anxious, hypervigilant has trouble to sleep has almost unformed stools, she feels also weaker.  She has paresthesias that affects the lower limbs mostly and I think this may also be related to the thyroid disease I actually think this is the only explanation.  She has burning foot  pain and numbness, not longer leg pain.  Interval history 12-28-2018;  Patient has slowly gotten better, less jittery and more calm.  She would like to discuss burning  feet and numbness, she has some trembling when emotional, she sleeps some night well and others less well.  Nausea comes and goes. She begun snacking to cut the nausea. She is on other BP medication , away form the diuretics that caused hyponatremia.    We will concentrate on tremor today:       Review of Systems: Out of a complete 14 system review, the patient complains of only the following symptoms, and all other reviewed systems are negative. Constant constipation, nausea but no vomiting. Mesenteric arterial stenosis, due to acuate ligament ( Dr. Carlean Purl).  Explaining the liquid  diarrhea?.  Epworth Sleepiness score still 5/24 points , Fatigue severity score 35/63  , depression score n/a- she is not longer tearful.     Social History   Socioeconomic History  . Marital status: Married    Spouse name: Not on file  . Number of children: 2  . Years of education: Not on file  . Highest education level: Not on file  Occupational History  . Occupation: retired  Scientific laboratory technician  . Financial resource strain: Not on file  . Food insecurity:    Worry: Not on file    Inability: Not on file  . Transportation needs:    Medical: Not on file    Non-medical: Not on file  Tobacco Use  . Smoking status: Former Smoker    Types: Cigarettes    Last attempt to quit: 01/23/1993    Years since quitting: 25.9  . Smokeless tobacco: Never Used  Substance and Sexual Activity  . Alcohol use: Yes    Alcohol/week: 2.0 standard drinks    Types: 2 Glasses of wine per week  . Drug use: No  . Sexual activity: Not on file  Lifestyle  . Physical activity:    Days per week: Not on file    Minutes per session: Not on file  . Stress: Not on file  Relationships  . Social connections:    Talks on phone: Not on file    Gets together: Not on file    Attends religious service: Not on file    Active member of club or organization: Not on file    Attends meetings of clubs or organizations: Not on file    Relationship status: Not on file  . Intimate partner violence:    Fear of current or ex partner: Not on file    Emotionally abused: Not on file    Physically abused: Not on file    Forced sexual activity: Not on file  Other Topics Concern  . Not on file  Social History Narrative   Patient is married she is retired and has 2 children   About 2 alcoholic beverages a week no drugs, no current tobacco she is a former smoker    Family History  Problem Relation Age of Onset  . Esophageal cancer Father   . Stomach cancer Father        mets from esophagus  . Heart disease Mother   .  Irritable bowel syndrome Mother   . Kidney disease Mother   . Other Mother        cushings disease  . Addison's disease Mother   . Heart failure Mother   . Stroke Mother   . Hypertension Mother   . Stroke Maternal Grandmother   . Hypertension Maternal Grandmother   . Colon cancer Neg Hx   . Heart attack Neg Hx     Past Medical History:  Diagnosis Date  . Adenomatous colon polyp   . Adenomatous polyp of colon 12/03/2004   29m  . Anxiety   . Chronic headaches   . Depression   . Diverticulosis   . External hemorrhoids   . Hyperlipidemia   . Hypertension   . Hypothyroidism   . Umbilical hernia     Past Surgical History:  Procedure Laterality Date  . ABDOMINAL HYSTERECTOMY  1975  . APPENDECTOMY  1975  . COLONOSCOPY  03/03/2010,12/03/2004  . ESOPHAGOGASTRODUODENOSCOPY  12/16/11   Dr. CSilvano Rusk . OOPHORECTOMY    . spinal lipoma removed  2006  . TOTAL ABDOMINAL HYSTERECTOMY      Current Outpatient Medications  Medication Sig Dispense Refill  . ALPRAZolam (XANAX) 0.5 MG tablet Take 0.25-0.5 mg by mouth 2 (two) times daily. Pt takes a full tab at night and whole a tab during day    . amLODipine (NORVASC) 5 MG tablet Take 1 tablet (5 mg total) by mouth daily. 90 tablet 1  . aspirin 81 MG tablet Take 81 mg by mouth daily.     .Marland Kitchenatorvastatin (LIPITOR) 10 MG tablet TAKE 1 TABLET BY MOUTH EVERY DAY 90 tablet 2  . bacitracin ophthalmic ointment Place 1 application into both eyes as needed. Dry eyes  0  . cephALEXin (KEFLEX) 250 MG capsule Take 250 mg by mouth daily.    .Marland Kitchenestrogens, conjugated, (PREMARIN) 0.45 MG tablet Take 0.45 mg by mouth daily.     .Marland Kitchenlosartan (COZAAR) 50 MG tablet Take 1 tablet (50 mg total) by mouth 2 (two) times daily. 180 tablet 2  . LOTEMAX 0.5 % ophthalmic suspension Place 1 drop into both eyes daily. As needed. For dry eyes    . magnesium hydroxide (MILK OF MAGNESIA) 400 MG/5ML suspension Take by mouth daily as needed for moderate constipation.     .  metoprolol succinate (TOPROL-XL) 25 MG 24 hr tablet TAKE 1/2 TABLET EVERY DAY 30 tablet 4  . Multiple Vitamin (MULTIVITAMIN) capsule Take 1 capsule by mouth daily.    . Omega-3 Fatty Acids (FISH OIL PO) Take 1 tablet by mouth daily.    . polyethylene glycol (MIRALAX / GLYCOLAX) packet Take 17 g by mouth daily as needed.    .Marland KitchenQUEtiapine (SEROQUEL) 25 MG tablet TAKE 1 TABLET BY MOUTH EVERYDAY AT BEDTIME 90 tablet 1  . sertraline (ZOLOFT) 25 MG tablet TAKE 1 TABLET BY MOUTH EVERY DAY 90 tablet 1  . SYNTHROID 75 MCG tablet Take 75 mcg by mouth daily. Takes 112 mg daily except Saturday a half tab and Sunday no pill  1  . traZODone (DESYREL) 100 MG tablet Take 100 mg by mouth at bedtime.   0  . Wheat Dextrin (BENEFIBER DRINK MIX PO) Take by mouth. Two teaspoons daily     No current facility-administered medications for this visit.     Allergies as of 12/28/2018 - Review Complete 12/28/2018  Allergen Reaction Noted  . Iodinated diagnostic agents Hives 12/18/2012  . Benazepril Cough 05/15/2015  . Hydrocodone-acetaminophen  02/12/2010  . Sulfonamide derivatives  07/22/2008  . Hydrocodone-acetaminophen Rash 06/25/2013    Vitals: BP 129/66   Pulse (!) 59   Ht 5' 4.5" (1.638 m)   Wt 145 lb (65.8 kg)   BMI 24.50 kg/m  Last Weight:  Wt Readings from Last 1 Encounters:  12/28/18 145 lb (65.8 kg)   BBWL:SLHTmass index is 24.5 kg/m.     Last Height:   Ht Readings from Last  1 Encounters:  12/28/18 5' 4.5" (1.638 m)    Physical exam:  General: The patient is awake, alert and appears not in acute distress. The patient is well groomed. She smiles, laughs and is much more relaxed.  Head: Normocephalic, atraumatic. Neck is supple. Mallampati 3,  neck circumference:14. Nasal airflow patent ,  Cardiovascular:  Regular rate and rhythm without  murmurs or carotid bruit, and without distended neck veins. Respiratory: Lungs are clear to auscultation. Skin:  Without evidence of edema, or rash Trunk:  BMI is low . The patient's posture is erect, her body language is that of a very anxious and worried individual.   Neurologic exam : The patient is awake and alert, oriented to place and time.   Memory subjective described as intact.   Attention span & concentration ability appears normal.  Speech is fluent,  without dysarthria, dysphonia -not Having aphasia.  Mood and affect are less anxious. Cranial nerves: Pupils are equal and briskly reactive to light. Facial sensation intact to fine touch. Facial motor strength is symmetric and tongue and uvula move midline. Shoulder shrug was symmetrical.  She has a mild titubation, a mild twitching under left eye and at the left mouth- and in both hands. Low amplitude.   Motor exam: tremulous muscle tone,  muscle bulk and symmetric strength in all extremities. Sensory:  Fine touch, pinprick and vibration were normal.  Finger-to-nose maneuver normal without evidence of ataxia, dysmetria but mild  tremor. Gait and station: Patient walks without assistive device and is able unassisted to climb up to the exam table. Turns with 3 Steps. Romberg testing is  negative. Deep tendon reflexes: in the upper and lower extremities are symmetric and intact.   Assessment:  After physical and neurologic examination, review of laboratory studies,  Personal review of imaging studies, reports of  neurophysiology testing and pre-existing records as far as provided in visit., my assessment is   1) she is much more relaxed and less anxious. New thyroid levels look normal, she feels more herself.   ( strongly suspected her thyroid function is cause of her insomnia and the recent exacerbation- she appears clinically to have hyperthyroidism.  She is jittery, has an increased muscle tone and is restless. Almost hypomanic, but in a very non -euphoric way, just feeling under pressure).   2) anxiety- she persisting low amplitude tremor contributes to this.  Mrs. Jump still  wakes up in the middle of the night but she usually gets a second sleep after period of wakefulness-this also in turn raises her level of anxiety and she does feel that the tremor the inner tremulousness is worse at night.  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 I will start her on Seroquel 25 mg at night to help her sleep.  Keep her benzodiazepines- she is too long on it to stop abruptly.  She can take a benzodiazepine in the middle of the night if she wakes up.  4) burning feet - neuropathy, electrolyte imbalance. I usually like for people to make sure that the electrolyte balance is preserved, she may want to introduce some V8, pickle or mustard into her dinner.   The patient was advised of the nature of the diagnosed disorder , the treatment options and the  risks for general health and wellness arising from not treating the condition.   I spent more than 60 minutes of face to face time with the patient.  Greater than 50% of  time was spent in counseling and coordination of care. We have discussed the diagnosis and differential and I answered the patient's questions.    Plan:  Treatment plan and additional workup: PRN Seroquel 25 mg- nightly.  Repeat CMET Sleep study is not necessary  EMG NCV ? - no loss of DTRs and no weakness, just numbness.   Rv in 12- 15  weeks.      Larey Seat, MD 0/42/4731, 9:24 PM  Certified in Neurology by ABPN Certified in [place] by [facility] [address], [place], [place] [address]

## 2018-12-28 NOTE — Addendum Note (Signed)
Addended by: Inis Sizer D on: 12/28/2018 03:11 PM   Modules accepted: Orders

## 2018-12-29 LAB — COMPREHENSIVE METABOLIC PANEL
A/G RATIO: 2.2 (ref 1.2–2.2)
ALT: 19 IU/L (ref 0–32)
AST: 27 IU/L (ref 0–40)
Albumin: 4.8 g/dL — ABNORMAL HIGH (ref 3.7–4.7)
Alkaline Phosphatase: 97 IU/L (ref 39–117)
BILIRUBIN TOTAL: 0.3 mg/dL (ref 0.0–1.2)
BUN/Creatinine Ratio: 11 — ABNORMAL LOW (ref 12–28)
BUN: 10 mg/dL (ref 8–27)
CO2: 23 mmol/L (ref 20–29)
Calcium: 9.9 mg/dL (ref 8.7–10.3)
Chloride: 102 mmol/L (ref 96–106)
Creatinine, Ser: 0.87 mg/dL (ref 0.57–1.00)
GFR calc Af Amer: 75 mL/min/{1.73_m2} (ref >59)
GFR calc non Af Amer: 65 mL/min/{1.73_m2} (ref >59)
Globulin, Total: 2.2 g/dL (ref 1.5–4.5)
Glucose: 89 mg/dL (ref 65–99)
Potassium: 4.4 mmol/L (ref 3.5–5.2)
Sodium: 142 mmol/L (ref 134–144)
Total Protein: 7 g/dL (ref 6.0–8.5)

## 2019-01-08 ENCOUNTER — Telehealth: Payer: Self-pay | Admitting: Neurology

## 2019-01-08 ENCOUNTER — Other Ambulatory Visit: Payer: Self-pay | Admitting: Neurology

## 2019-01-08 ENCOUNTER — Encounter: Payer: Self-pay | Admitting: Neurology

## 2019-01-08 MED ORDER — ROPINIROLE HCL 0.25 MG PO TABS: 0.2500 mg | ORAL_TABLET | Freq: Every day | ORAL | 1 refills | Status: DC

## 2019-01-08 NOTE — Telephone Encounter (Signed)
Patient is calling in requesting a call back , she states she has some personal issues she needs to discuss

## 2019-01-08 NOTE — Telephone Encounter (Signed)
Low dose requip at night for the inner shakiness an leg pain.

## 2019-01-08 NOTE — Telephone Encounter (Signed)
Called the patient back and she was just wanting to make it known that since her last week visit with Dr Dohmeier she and Dr Dohmeier discussed the pain she has been having in her legs and feet. She has been having worsening pain in legs and feet bilaterally. Pt states that at the visit Dr Dohmeier discussed potentially starting something to help with that but at the visit she declined. She states that since this is increasing she would like to discuss potentially discussing starting something that will help with that. Patient also wanted to advise Dr Dohmeier that the shakiness that she mentioned in the visit and showed Dr Roxy Horseman is worsening as well. Patient has requested to be placed on wait list if a cancellation comes available to get in sooner then 4/9 for EMG/NCV. I went ahead and took a look at the schedule and there was an opening on wed march 11 at 3:15 and 4 pm. Patient took that and I cancelled the April 9th apt for her. I advised that I will make Dr Dohmeier aware of her concerns but informed her that since we pushed the testing up to Wednesday that it may be worth waiting and seeing what the test shows before starting medication. Informed her that if Dr Brett Fairy would like to go ahead and start her on something to help I will notify her and let her know. Pt verbalized understanding.

## 2019-01-10 ENCOUNTER — Ambulatory Visit (INDEPENDENT_AMBULATORY_CARE_PROVIDER_SITE_OTHER): Payer: Medicare Other | Admitting: Neurology

## 2019-01-10 ENCOUNTER — Other Ambulatory Visit: Payer: Self-pay

## 2019-01-10 ENCOUNTER — Encounter: Payer: Self-pay | Admitting: Neurology

## 2019-01-10 ENCOUNTER — Ambulatory Visit: Payer: Medicare Other | Admitting: Neurology

## 2019-01-10 DIAGNOSIS — M79605 Pain in left leg: Secondary | ICD-10-CM | POA: Diagnosis not present

## 2019-01-10 DIAGNOSIS — R946 Abnormal results of thyroid function studies: Secondary | ICD-10-CM

## 2019-01-10 DIAGNOSIS — M79604 Pain in right leg: Secondary | ICD-10-CM

## 2019-01-10 DIAGNOSIS — E0590 Thyrotoxicosis, unspecified without thyrotoxic crisis or storm: Secondary | ICD-10-CM

## 2019-01-10 DIAGNOSIS — E079 Disorder of thyroid, unspecified: Secondary | ICD-10-CM

## 2019-01-10 DIAGNOSIS — R251 Tremor, unspecified: Secondary | ICD-10-CM

## 2019-01-10 DIAGNOSIS — I10 Essential (primary) hypertension: Secondary | ICD-10-CM

## 2019-01-10 DIAGNOSIS — G63 Polyneuropathy in diseases classified elsewhere: Secondary | ICD-10-CM

## 2019-01-10 NOTE — Progress Notes (Signed)
Please refer to EMG and nerve conduction procedure note.  

## 2019-01-10 NOTE — Progress Notes (Signed)
Nice    Nerve / Sites Muscle Latency Ref. Amplitude Ref. Rel Amp Segments Distance Velocity Ref. Area    ms ms mV mV %  cm m/s m/s mVms  R Peroneal - EDB     Ankle EDB 4.3 ?6.5 4.5 ?2.0 100 Ankle - EDB 9   13.2     Fib head EDB 10.2  3.8  84.8 Fib head - Ankle 26 45 ?44 11.9     Pop fossa EDB 12.4  3.8  102 Pop fossa - Fib head 10 45 ?44 12.1         Pop fossa - Ankle      L Peroneal - EDB     Ankle EDB 3.9 ?6.5 3.6 ?2.0 100 Ankle - EDB 9   11.5     Fib head EDB 9.3  3.0  84.4 Fib head - Ankle 25 46 ?44 11.0     Pop fossa EDB 11.5  3.0  98.7 Pop fossa - Fib head 10 46 ?44 11.4         Pop fossa - Ankle      R Tibial - AH     Ankle AH 3.7 ?5.8 10.0 ?4.0 100 Ankle - AH 9   19.7     Pop fossa AH 12.0  6.4  64.3 Pop fossa - Ankle 34 41 ?41 15.7  L Tibial - AH     Ankle AH 3.8 ?5.8 8.5 ?4.0 100 Ankle - AH 9   26.4     Pop fossa AH 12.2  8.5  100 Pop fossa - Ankle 34 41 ?41 21.8             SNC    Nerve / Sites Rec. Site Peak Lat Ref.  Amp Ref. Segments Distance    ms ms V V  cm  R Sural - Ankle (Calf)     Calf Ankle 3.3 ?4.4 6 ?6 Calf - Ankle 14  L Sural - Ankle (Calf)     Calf Ankle 3.5 ?4.4 6 ?6 Calf - Ankle 14  R Superficial peroneal - Ankle     Lat leg Ankle 4.4 ?4.4 2 ?6 Lat leg - Ankle 14  L Superficial peroneal - Ankle     Lat leg Ankle 4.3 ?4.4 3 ?6 Lat leg - Ankle 14              F  Wave    Nerve F Lat Ref.   ms ms  R Tibial - AH 52.6 ?56.0  L Tibial - AH 52.0 ?56.0

## 2019-01-10 NOTE — Procedures (Signed)
     HISTORY:  Debbie Wells is a 78 year old patient with a history of hypothyroidism.  The patient was found to have hyperthyroidism approximately 6 months ago, she began having numbness in both feet around that time with pain that goes up the legs to the hip level frequently.  The patient claims that the pain is equal from the right side to the left side without pain or numbness on the body or any low back pain.  She does feel that she has some gait instability at times.  She is being evaluated for a possible neuropathy or a radiculopathy.  NERVE CONDUCTION STUDIES:  Nerve conduction studies were performed on both lower extremities. The distal motor latencies and motor amplitudes for the peroneal and posterior tibial nerves were within normal limits. The nerve conduction velocities for these nerves were also normal. The sensory latencies for the peroneal and sural nerves were within normal limits. The F wave latencies for the posterior tibial nerves were within normal limits.   EMG STUDIES:  EMG study was performed on the right lower extremity:  The tibialis anterior muscle reveals 2 to 4K motor units with full recruitment. No fibrillations or positive waves were seen. The peroneus tertius muscle reveals 2 to 4K motor units with full recruitment. No fibrillations or positive waves were seen. The medial gastrocnemius muscle reveals 1 to 3K motor units with full recruitment. No fibrillations or positive waves were seen. The vastus lateralis muscle reveals 2 to 4K motor units with full recruitment. No fibrillations or positive waves were seen. The iliopsoas muscle reveals 2 to 4K motor units with full recruitment. No fibrillations or positive waves were seen. The biceps femoris muscle (long head) reveals 2 to 4K motor units with full recruitment. No fibrillations or positive waves were seen. The lumbosacral paraspinal muscles were tested at 3 levels, and revealed no abnormalities of insertional  activity at all 3 levels tested. There was good relaxation.   IMPRESSION:  Nerve conduction studies done on both lower extremities were within normal limits.  No evidence of a peripheral neuropathy was seen.  A small fiber neuropathy may be missed by standard nerve conduction studies, however.  Clinical correlation is required.  EMG evaluation of the right lower extremity was unremarkable, no evidence of an overlying lumbosacral radiculopathy was seen.  Jill Alexanders MD 01/10/2019 4:23 PM  [facility] [address] [place], [place] [address]  Phone [phone] Fax [phone]

## 2019-01-12 ENCOUNTER — Telehealth: Payer: Self-pay | Admitting: *Deleted

## 2019-01-12 NOTE — Telephone Encounter (Signed)
I spoke with the patient and we discussed her results from the EMG. All of her questions were answered. We discussed her requip as well. She will call back in a week or two if she doesn't see improvement in her leg pain. She verbalized appreciation for the call. Next appt scheduled for May 01, 2019.

## 2019-01-12 NOTE — Telephone Encounter (Signed)
-----   Message from Larey Seat, MD sent at 01/11/2019 12:16 PM EDT ----- IMPRESSION:  Nerve conduction studies done on both lower extremities were  within normal limits. No evidence of a peripheral neuropathy was  seen. A small fiber neuropathy may be missed by standard nerve  conduction studies, however. Clinical correlation is required.  EMG evaluation of the right lower extremity was unremarkable, no  evidence of an overlying lumbosacral radiculopathy was seen.  Jill Alexanders MD 01/10/2019 4:23 PM  Good news- She is still improving - thyroid levels respond slowly and she has not had signs of Neuropathy in medium or large nerves.

## 2019-02-03 ENCOUNTER — Other Ambulatory Visit: Payer: Self-pay | Admitting: Neurology

## 2019-02-07 ENCOUNTER — Other Ambulatory Visit: Payer: Self-pay | Admitting: Cardiology

## 2019-02-07 NOTE — Telephone Encounter (Signed)
Metoprolol succ 25 mg refilled. 

## 2019-02-08 ENCOUNTER — Ambulatory Visit: Payer: Medicare Other | Admitting: Neurology

## 2019-02-08 ENCOUNTER — Encounter: Payer: Medicare Other | Admitting: Diagnostic Neuroimaging

## 2019-03-01 ENCOUNTER — Other Ambulatory Visit: Payer: Self-pay | Admitting: Cardiology

## 2019-03-01 NOTE — Telephone Encounter (Signed)
Amlodipine 5 mg refilled. 

## 2019-04-03 ENCOUNTER — Other Ambulatory Visit: Payer: Self-pay | Admitting: Neurology

## 2019-04-30 ENCOUNTER — Other Ambulatory Visit: Payer: Self-pay

## 2019-04-30 ENCOUNTER — Ambulatory Visit: Payer: Medicare Other | Admitting: Neurology

## 2019-04-30 ENCOUNTER — Encounter: Payer: Self-pay | Admitting: Neurology

## 2019-04-30 VITALS — BP 124/63 | HR 61 | Temp 98.7°F | Ht 64.5 in | Wt 146.0 lb

## 2019-04-30 DIAGNOSIS — M79604 Pain in right leg: Secondary | ICD-10-CM | POA: Diagnosis not present

## 2019-04-30 DIAGNOSIS — M79605 Pain in left leg: Secondary | ICD-10-CM

## 2019-04-30 DIAGNOSIS — E079 Disorder of thyroid, unspecified: Secondary | ICD-10-CM | POA: Diagnosis not present

## 2019-04-30 DIAGNOSIS — D508 Other iron deficiency anemias: Secondary | ICD-10-CM

## 2019-04-30 DIAGNOSIS — G63 Polyneuropathy in diseases classified elsewhere: Secondary | ICD-10-CM

## 2019-04-30 DIAGNOSIS — R251 Tremor, unspecified: Secondary | ICD-10-CM | POA: Diagnosis not present

## 2019-04-30 MED ORDER — ZALEPLON 5 MG PO CAPS: 5.0000 mg | ORAL_CAPSULE | Freq: Every evening | ORAL | 0 refills | Status: DC | PRN

## 2019-04-30 NOTE — Progress Notes (Signed)
SLEEP MEDICINE CLINIC   Provider:    , M.D.   Primary Care Physician:  South, Stephen, MD  Referring Provider: South, Stephen, MD   Chief Complaint  Patient presents with  . Follow-up    pt alone, rm 10. pt here following up.    HPI:  Debbie Wells is a 78 y.o. female patient , seen here on 10-12-2018  in a referral from Dr. 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  The patient was referred to Cardiology for lower extremity pain,  The circulation was fine.  Her thyroid function has been high, her TSH was very low for years and in May 2019 , after had Dr South adjusted the dose-the TSH  increased to 2.2 from previously 0.18 (!) and 0.68 since 2014.  At the time her TSH was 0.18 she had been cold, reportedly had bradycardia, and was reporting low energy. She felt jittery , too- unexpectedly for a high thyroid function. She developed also numbness in her legs- which has improved over the last 2 month.   The increase in TSH was likely taking place at the same time during which  her insomnia became more prominent, an this has gotten worse.   Chief complaint according to patient :" I can't sleep, I am jittery"  Sleep habits are as follows: goes to bed at 11 o clock and can go to sleep but not stay asleep- wakes about 2 -3 hours later, stays awake until she gets up to get  Second dose, such as benadryl, Xanax.On a 14 day routine right now. averaging 5 hours of sleep all together, not able to nap   Sleep medical history: 09/16/2018 - HTN peaked, started Amlodipine.  She just recieved a diagnosis of hyponatremia. Long standing "hyperthyroidism" acc. to low TSH.  Family sleep history:  Mother had MEN, multiple endocrine neoplasia . Parathyroid disease. She had kidney disease.  Mother also have suffered from Cushing's disease resulting in Addison's disease, hypertension which has affected not just mother but also maternal grandmother and maternal aunts.  2 maternal aunts have  suffered from breast cancer, a paternal uncle took his own life after a struggle with severe depression.  Social history: married. Retired. Non smoker, non drinker, caffeine : coffee in early AM, no sodas, no caffeinated iced tea.  Interval history : Summarized I have the pleasure of seeing Debbie Wells today in the presence of her husband about 3 weeks after we met last.   Today is 08 November 2018 the patient has a quite significant abnormality on her thyroid comprehensive panel.  The free T4 hormone was elevated at 1.86 with total T4 was elevated at 14.5 mcg/dL her T3 uptake was normal her free thyroxine was normal and her free T3 hormone was in normal range her TSH is now in normal range after years of being too low but it came at the upper on the account of a very high T4.  Her symptoms are reflective of hyper thyroidism.  She feels jittery , anxious, hypervigilant has trouble to sleep has almost unformed stools, she feels also weaker.  She has paresthesias that affects the lower limbs mostly and I think this may also be related to the thyroid disease I actually think this is the only explanation.  She has burning foot  pain and numbness, not longer leg pain.  Interval history 12-28-2018;  Patient has slowly gotten better, less jittery and more calm.  She would like to discuss burning feet and   numbness, she has some trembling when emotional, she sleeps some night well and others less well.  Nausea comes and goes. She begun snacking to cut the nausea. She is on other BP medication , away form the diuretics that caused hyponatremia.   We will concentrate on tremor today:   04-30-2019 interval visit with this 78 year old caucasian, right handed female patient who underwent EMG and NCV with dr Willis here at [facility]. She had a significant improvement in sleep since November 2019 , but some symptoms of neuropathy had been persistently present. The NCV and EMG did not indicate any NP or radiculopathy. She  reports being more anxious, attributed this to the Coronavirus pandemic. She wakes up early and can't go back to sleep.  seroquel did not work well, felt groggy and hypervigilant- the opposite that is was intended to do.  She has no indication that her thyroid function is impaired or imbalanced other than she believes she has hair loss, she showed me that fingernails have changed, these are ridged. .  Dr. South confirmed normal labs- free T4 and TSH, and a slight anemia.-  in a virtual visit on May 28th .  I am not sure what else I can do fer her- we could use a short acting sleep aid for early morning arousals, start biotin and treat anxiety with CBT, plus: have her endocrinologist follow her thyroid function.         Review of Systems: Out of a complete 14 system review, the patient complains of only the following symptoms, and all other reviewed systems are negative. Constant constipation, nausea but no vomiting. Mesenteric arterial stenosis, due to acuate ligament ( Dr. Gessner).  Explaining the liquid diarrhea?.  Epworth Sleepiness score still 7/24 points , Fatigue severity score 55/63  , depression score n/a- she is not longer tearful.     Social History   Socioeconomic History  . Marital status: Married    Spouse name: Not on file  . Number of children: 2  . Years of education: Not on file  . Highest education level: Not on file  Occupational History  . Occupation: retired  Social Needs  . Financial resource strain: Not on file  . Food insecurity    Worry: Not on file    Inability: Not on file  . Transportation needs    Medical: Not on file    Non-medical: Not on file  Tobacco Use  . Smoking status: Former Smoker    Types: Cigarettes    Quit date: 01/23/1993    Years since quitting: 26.2  . Smokeless tobacco: Never Used  Substance and Sexual Activity  . Alcohol use: Yes    Alcohol/week: 2.0 standard drinks    Types: 2 Glasses of wine per week  . Drug use: No  .  Sexual activity: Not on file  Lifestyle  . Physical activity    Days per week: Not on file    Minutes per session: Not on file  . Stress: Not on file  Relationships  . Social connections    Talks on phone: Not on file    Gets together: Not on file    Attends religious service: Not on file    Active member of club or organization: Not on file    Attends meetings of clubs or organizations: Not on file    Relationship status: Not on file  . Intimate partner violence    Fear of current or ex partner: Not on file      Emotionally abused: Not on file    Physically abused: Not on file    Forced sexual activity: Not on file  Other Topics Concern  . Not on file  Social History Narrative   Patient is married she is retired and has 2 children   About 2 alcoholic beverages a week no drugs, no current tobacco she is a former smoker    Family History  Problem Relation Age of Onset  . Esophageal cancer Father   . Stomach cancer Father        mets from esophagus  . Heart disease Mother   . Irritable bowel syndrome Mother   . Kidney disease Mother   . Other Mother        cushings disease  . Addison's disease Mother   . Heart failure Mother   . Stroke Mother   . Hypertension Mother   . Stroke Maternal Grandmother   . Hypertension Maternal Grandmother   . Colon cancer Neg Hx   . Heart attack Neg Hx     Past Medical History:  Diagnosis Date  . Adenomatous colon polyp   . Adenomatous polyp of colon 12/03/2004   44m  . Anxiety   . Chronic headaches   . Depression   . Diverticulosis   . External hemorrhoids   . Hyperlipidemia   . Hypertension   . Hypothyroidism   . Umbilical hernia     Past Surgical History:  Procedure Laterality Date  . ABDOMINAL HYSTERECTOMY  1975  . APPENDECTOMY  1975  . COLONOSCOPY  03/03/2010,12/03/2004  . ESOPHAGOGASTRODUODENOSCOPY  12/16/11   Dr. CSilvano Rusk . OOPHORECTOMY    . spinal lipoma removed  2006  . TOTAL ABDOMINAL HYSTERECTOMY      Current  Outpatient Medications  Medication Sig Dispense Refill  . ALPRAZolam (XANAX) 0.5 MG tablet Take 0.25-0.5 mg by mouth 2 (two) times daily. Pt takes a full tab at night and whole a tab during day    . amLODipine (NORVASC) 5 MG tablet TAKE 1 TABLET BY MOUTH EVERY DAY 90 tablet 3  . aspirin 81 MG tablet Take 81 mg by mouth daily.     .Marland Kitchenatorvastatin (LIPITOR) 10 MG tablet TAKE 1 TABLET BY MOUTH EVERY DAY 90 tablet 2  . bacitracin ophthalmic ointment Place 1 application into both eyes as needed. Dry eyes  0  . cephALEXin (KEFLEX) 250 MG capsule Take 250 mg by mouth daily.    .Marland Kitchenestrogens, conjugated, (PREMARIN) 0.45 MG tablet Take 0.45 mg by mouth daily.     .Marland Kitchenlosartan (COZAAR) 50 MG tablet Take 1 tablet (50 mg total) by mouth 2 (two) times daily. 180 tablet 2  . LOTEMAX 0.5 % ophthalmic suspension Place 1 drop into both eyes daily. As needed. For dry eyes    . magnesium hydroxide (MILK OF MAGNESIA) 400 MG/5ML suspension Take by mouth daily as needed for moderate constipation.     . metoprolol succinate (TOPROL-XL) 25 MG 24 hr tablet TAKE 1/2 TABLET EVERY DAY 30 tablet 4  . Multiple Vitamin (MULTIVITAMIN) capsule Take 1 capsule by mouth daily.    . Omega-3 Fatty Acids (FISH OIL PO) Take 1 tablet by mouth daily.    . polyethylene glycol (MIRALAX / GLYCOLAX) packet Take 17 g by mouth daily as needed.    .Marland KitchenQUEtiapine (SEROQUEL) 25 MG tablet TAKE 1 TABLET BY MOUTH EVERYDAY AT BEDTIME 90 tablet 1  . sertraline (ZOLOFT) 25 MG tablet TAKE 1 TABLET BY MOUTH EVERY DAY  90 tablet 1  . SYNTHROID 75 MCG tablet Take 75 mcg by mouth daily. Takes 112 mg daily except Saturday a half tab and Sunday no pill  1  . traZODone (DESYREL) 100 MG tablet Take 100 mg by mouth at bedtime.   0  . Wheat Dextrin (BENEFIBER DRINK MIX PO) Take by mouth. Two teaspoons daily     No current facility-administered medications for this visit.     Allergies as of 04/30/2019 - Review Complete 04/30/2019  Allergen Reaction Noted  .  Iodinated diagnostic agents Hives 12/18/2012  . Benazepril Cough 05/15/2015  . Hydrocodone-acetaminophen  02/12/2010  . Sulfonamide derivatives  07/22/2008  . Hydrocodone-acetaminophen Rash 06/25/2013    Vitals: BP 124/63   Pulse 61   Temp 98.7 F (37.1 C)   Ht 5' 4.5" (1.638 m)   Wt 146 lb (66.2 kg)   BMI 24.67 kg/m  Last Weight:  Wt Readings from Last 1 Encounters:  04/30/19 146 lb (66.2 kg)   BMI:Body mass index is 24.67 kg/m.     Last Height:   Ht Readings from Last 1 Encounters:  04/30/19 5' 4.5" (1.638 m)    Physical exam:  General: The patient is awake, alert and appears not in acute distress. The patient is well groomed. She smiles, laughs and is much more relaxed.  Head: Normocephalic, atraumatic. Neck is supple. Mallampati 3,  neck circumference:14. Nasal airflow patent ,  Cardiovascular:  Regular rate and rhythm without  murmurs or carotid bruit, and without distended neck veins. Respiratory: Lungs are clear to auscultation. Skin:  Without evidence of edema, or rash Trunk: BMI is low . The patient's posture is erect, her body language is that of a very anxious and worried individual.   Neurologic exam : The patient is awake and alert, oriented to place and time.   Memory subjective described as intact.   Attention span & concentration ability appears normal.  Speech is fluent,  without dysarthria, dysphonia -not Having aphasia.  Mood and affect are less anxious. Cranial nerves: Pupils are equal and briskly reactive to light. Facial sensation intact to fine touch. Facial motor strength is symmetric and tongue and uvula move midline. Shoulder shrug was symmetrical.  She has a mild titubation, a mild twitching under left eye and at the left mouth- and in both hands. Low amplitude- the tremor is not a handicap for her. .   Motor exam: tremulous muscle tone,  muscle bulk and symmetric strength in all extremities. Sensory:  Fine touch, pinprick and vibration were  felt in both ankles norma- she feels subjectively numb. .  Finger-to-nose maneuver normal without evidence of ataxia, dysmetria but mild tremor. Gait and station: Patient walks without assistive device and is able unassisted to climb up to the exam table. Turns with 3 Steps. Romberg testing is  negative. Deep tendon reflexes: in the upper and lower extremities are symmetric and intact.   Assessment:  After physical and neurologic examination, review of laboratory studies,  Personal review of imaging studies, reports of  neurophysiology testing and pre-existing records as far as provided in visit., my assessment is   1) she is much more relaxed but is again  anxious. New thyroid levels look normal, she reports  more hairloss and ridged nails. .   ( I strongly suspected her thyroid function was cause of her insomnia and the recent exacerbation- she appears clinically to have had hyperthyroidism.  She is jittery, has an increased muscle tone and is restless. Almost   hypomanic, but in a very non -euphoric way, just feeling under pressure). She is now more calm but still anxious. .   2) anxiety- she persisting low amplitude tremor contributes to this.  Debbie Wells still wakes up in the middle of the night but she usually gets a second sleep after period of wakefulness-this also in turn raises her level of anxiety and she does feel that the tremor the inner tremulousness is worse at night.  3) could not tolerate  Seroquel 25 mg at night to help her sleep.  Keep her benzodiazepines- she is too long on it to stop abruptly.  She can take a benzodiazepine in the middle of the night if she wakes up. Start Sonata prn in the middle of the night.   4) fine fiber NP ?  burning feet - neuropathy. May need magnesium.,   The patient was advised of the nature of the diagnosed disorder , the treatment options and the  risks for general health and wellness arising from not treating the condition.   I spent more than  25 minutes of face to face time with the patient.  Greater than 50% of time was spent in counseling and coordination of care. We have discussed the diagnosis and differential and I answered the patient's questions.    Plan:  Treatment plan and additional workup: Sonata -prn  Reviewed labs from [facility]-  Had May 4th TSH and T4 free and CBC diff.  Sleep study is still not necessary    Rv in 12- 15  weeks.       , MD 04/30/2019, 10:38 AM  Certified in Neurology by ABPN Certified in Sleep Medicine by ABSM  [facility] [address], [address] [place], [place] [address]           

## 2019-04-30 NOTE — Patient Instructions (Signed)
Hyperthyroidism  Hyperthyroidism is when the thyroid gland is too active (overactive). The thyroid gland is a small gland located in the lower front part of the neck, just in front of the windpipe (trachea). This gland makes hormones that help control how the body uses food for energy (metabolism) as well as how the heart and brain function. These hormones also play a role in keeping your bones strong. When the thyroid is overactive, it produces too much of a hormone called thyroxine. What are the causes? This condition may be caused by:  Graves' disease. This is a disorder in which the body's disease-fighting system (immune system) attacks the thyroid gland. This is the most common cause.  Inflammation of the thyroid gland.  A tumor in the thyroid gland.  Use of certain medicines, including: ? Prescription thyroid hormone replacement. ? Herbal supplements that mimic thyroid hormones. ? Amiodarone therapy.  Solid or fluid-filled lumps within your thyroid gland (thyroid nodules).  Taking in a large amount of iodine from foods or medicines. What increases the risk? You are more likely to develop this condition if:  You are female.  You have a family history of thyroid conditions.  You smoke tobacco.  You use a medicine called lithium.  You take medicines that affect the immune system (immunosuppressants). What are the signs or symptoms? Symptoms of this condition include:  Nervousness.  Inability to tolerate heat.  Unexplained weight loss.  Diarrhea.  Change in the texture of hair or skin.  Heart skipping beats or making extra beats.  Rapid heart rate.  Loss of menstruation.  Shaky hands.  Fatigue.  Restlessness.  Sleep problems.  Enlarged thyroid gland or a lump in the thyroid (nodule). You may also have symptoms of Graves' disease, which may include:  Protruding eyes.  Dry eyes.  Red or swollen eyes.  Problems with vision. How is this diagnosed?  This condition may be diagnosed based on:  Your symptoms and medical history.  A physical exam.  Blood tests.  Thyroid ultrasound. This test involves using sound waves to produce images of the thyroid gland.  A thyroid scan. A radioactive substance is injected into a vein, and images show how much iodine is present in the thyroid.  Radioactive iodine uptake test (RAIU). A small amount of radioactive iodine is given by mouth to see how much iodine the thyroid absorbs after a certain amount of time. How is this treated? Treatment depends on the cause and severity of the condition. Treatment may include:  Medicines to reduce the amount of thyroid hormone your body makes.  Radioactive iodine treatment (radioiodine therapy). This involves swallowing a small dose of radioactive iodine, in capsule or liquid form, to kill thyroid cells.  Surgery to remove part or all of your thyroid gland. You may need to take thyroid hormone replacement medicine for the rest of your life after thyroid surgery.  Medicines to help manage your symptoms. Follow these instructions at home:   Take over-the-counter and prescription medicines only as told by your health care provider.  Do not use any products that contain nicotine or tobacco, such as cigarettes and e-cigarettes. If you need help quitting, ask your health care provider.  Follow any instructions from your health care provider about diet. You may be instructed to limit foods that contain iodine.  Keep all follow-up visits as told by your health care provider. This is important. ? You will need to have blood tests regularly so that your health care provider can   monitor your condition. Contact a health care provider if:  Your symptoms do not get better with treatment.  You have a fever.  You are taking thyroid hormone replacement medicine and you: ? Have symptoms of depression. ? Feel like you are tired all the time. ? Gain weight. Get help  right away if:  You have chest pain.  You have decreased alertness or a change in your awareness.  You have abdominal pain.  You feel dizzy.  You have a rapid heartbeat.  You have an irregular heartbeat.  You have difficulty breathing. Summary  The thyroid gland is a small gland located in the lower front part of the neck, just in front of the windpipe (trachea).  Hyperthyroidism is when the thyroid gland is too active (overactive) and produces too much of a hormone called thyroxine.  The most common cause is Graves' disease, a disorder in which your immune system attacks the thyroid gland.  Hyperthyroidism can cause various symptoms, such as unexplained weight loss, nervousness, inability to tolerate heat, or changes in your heartbeat.  Treatment may include medicine to reduce the amount of thyroid hormone your body makes, radioiodine therapy, surgery, or medicines to manage symptoms. This information is not intended to replace advice given to you by your health care provider. Make sure you discuss any questions you have with your health care provider. Document Released: 10/18/2005 Document Revised: 09/30/2017 Document Reviewed: 09/28/2017 Elsevier Patient Education  [place]. Hypothyroidism  Hypothyroidism is when the thyroid gland does not make enough of certain hormones (it is underactive). The thyroid gland is a small gland located in the lower front part of the neck, just in front of the windpipe (trachea). This gland makes hormones that help control how the body uses food for energy (metabolism) as well as how the heart and brain function. These hormones also play a role in keeping your bones strong. When the thyroid is underactive, it produces too little of the hormones thyroxine (T4) and triiodothyronine (T3). What are the causes? This condition may be caused by:  Hashimoto's disease. This is a disease in which the body's disease-fighting system (immune system)  attacks the thyroid gland. This is the most common cause.  Viral infections.  Pregnancy.  Certain medicines.  Birth defects.  Past radiation treatments to the head or neck for cancer.  Past treatment with radioactive iodine.  Past exposure to radiation in the environment.  Past surgical removal of part or all of the thyroid.  Problems with a gland in the center of the brain (pituitary gland).  Lack of enough iodine in the diet. What increases the risk? You are more likely to develop this condition if:  You are female.  You have a family history of thyroid conditions.  You use a medicine called lithium.  You take medicines that affect the immune system (immunosuppressants). What are the signs or symptoms? Symptoms of this condition include:  Feeling as though you have no energy (lethargy).  Not being able to tolerate cold.  Weight gain that is not explained by a change in diet or exercise habits.  Lack of appetite.  Dry skin.  Coarse hair.  Menstrual irregularity.  Slowing of thought processes.  Constipation.  Sadness or depression. How is this diagnosed? This condition may be diagnosed based on:  Your symptoms, your medical history, and a physical exam.  Blood tests. You may also have imaging tests, such as an ultrasound or MRI. How is this treated? This condition is  treated with medicine that replaces the thyroid hormones that your body does not make. After you begin treatment, it may take several weeks for symptoms to go away. Follow these instructions at home:  Take over-the-counter and prescription medicines only as told by your health care provider.  If you start taking any new medicines, tell your health care provider.  Keep all follow-up visits as told by your health care provider. This is important. ? As your condition improves, your dosage of thyroid hormone medicine may change. ? You will need to have blood tests regularly so that your  health care provider can monitor your condition. Contact a health care provider if:  Your symptoms do not get better with treatment.  You are taking thyroid replacement medicine and you: ? Sweat a lot. ? Have tremors. ? Feel anxious. ? Lose weight rapidly. ? Cannot tolerate heat. ? Have emotional swings. ? Have diarrhea. ? Feel weak. Get help right away if you have:  Chest pain.  An irregular heartbeat.  A rapid heartbeat.  Difficulty breathing. Summary  Hypothyroidism is when the thyroid gland does not make enough of certain hormones (it is underactive).  When the thyroid is underactive, it produces too little of the hormones thyroxine (T4) and triiodothyronine (T3).  The most common cause is Hashimoto's disease, a disease in which the body's disease-fighting system (immune system) attacks the thyroid gland. The condition can also be caused by viral infections, medicine, pregnancy, or past radiation treatment to the head or neck.  Symptoms may include weight gain, dry skin, constipation, feeling as though you do not have energy, and not being able to tolerate cold.  This condition is treated with medicine to replace the thyroid hormones that your body does not make. This information is not intended to replace advice given to you by your health care provider. Make sure you discuss any questions you have with your health care provider. Document Released: 10/18/2005 Document Revised: 09/30/2017 Document Reviewed: 09/28/2017 Elsevier Patient Education  2020 [employer].

## 2019-05-01 ENCOUNTER — Telehealth: Payer: Self-pay | Admitting: Neurology

## 2019-05-01 ENCOUNTER — Ambulatory Visit: Payer: Medicare Other | Admitting: Neurology

## 2019-05-01 LAB — IRON,TIBC AND FERRITIN PANEL
Ferritin: 209 ng/mL — ABNORMAL HIGH (ref 15–150)
Iron Saturation: 15 % (ref 15–55)
Iron: 46 ug/dL (ref 27–139)
Total Iron Binding Capacity: 314 ug/dL (ref 250–450)
UIBC: 268 ug/dL (ref 118–369)

## 2019-05-01 LAB — TSH+FREE T4
Free T4: 1.43 ng/dL (ref 0.82–1.77)
TSH: 1.71 u[IU]/mL (ref 0.450–4.500)

## 2019-05-01 NOTE — Telephone Encounter (Signed)
Called the patient and reviewed the lab work results. Informed her with the low iron saturation she would recommend taking a otc prenatal supplement with iron. Pt verbalized understanding. Patient asked about a medication for sleep. Reviewed the notes and appears she sent a script for sonata for the patient to the pharmacy. Pt verbalized understanding. Pt had no questions at this time but was encouraged to call back if questions arise.

## 2019-05-01 NOTE — Telephone Encounter (Signed)
-----   Message from Larey Seat, MD sent at 05/01/2019  2:20 PM EDT ----- Low normal iron saturation and high ferritin, result not likely to explain RLS symptoms. High ferritin with low iron saturation is rarely seen and can be related to inflammation.  Normal free T4 ( elevated 6 month ago) and TSH normal, too.

## 2019-05-09 ENCOUNTER — Telehealth: Payer: Self-pay | Admitting: *Deleted

## 2019-05-09 NOTE — Telephone Encounter (Signed)
A message was left, re: follow up visit. 

## 2019-05-11 ENCOUNTER — Other Ambulatory Visit: Payer: Self-pay | Admitting: Cardiology

## 2019-07-02 ENCOUNTER — Other Ambulatory Visit: Payer: Self-pay

## 2019-07-02 ENCOUNTER — Ambulatory Visit (INDEPENDENT_AMBULATORY_CARE_PROVIDER_SITE_OTHER): Payer: Medicare Other | Admitting: Cardiology

## 2019-07-02 ENCOUNTER — Encounter: Payer: Self-pay | Admitting: Cardiology

## 2019-07-02 VITALS — BP 128/74 | HR 59 | Temp 97.7°F | Ht 64.5 in | Wt 147.0 lb

## 2019-07-02 DIAGNOSIS — E785 Hyperlipidemia, unspecified: Secondary | ICD-10-CM

## 2019-07-02 DIAGNOSIS — I10 Essential (primary) hypertension: Secondary | ICD-10-CM

## 2019-07-02 DIAGNOSIS — R002 Palpitations: Secondary | ICD-10-CM | POA: Diagnosis not present

## 2019-07-02 NOTE — Patient Instructions (Signed)

## 2019-07-02 NOTE — Progress Notes (Signed)
HPI: FU hypertension. She has a history of hypertension which has been somewhat difficult to control.She had previously worn an outpatient 24-hour blood pressure monitor which showed wide swings of blood pressure.She has had a 24-hour urine collection for metanephrines which was normal. She has had blood work in Dr. Jerilynn Som which has ruled out Cushing's syndrome.Her cardiac workup has included a Lexiscan Myoview stress test on 12/07/12 showing no evidence of ischemia and her ejection fraction was 60% and there were no wall motion abnormalities. She had a previous echocardiogram 09/17/11 showing normal LV function and mild mitral regurgitation. Renal dopplers 2014 showed mild 1-59 right RAS. She has had problems with chronic steady pain in the left epigastrium and left upper quadrant and has seen her gastroenterologist Dr. Carlean Purl. She has seen Dr. Mamie Nick regarding question of mesenteric and or celiac artery stenosis as a possible cause of her left upper quadrant pain. Dr. Kellie Simmering did not feel that the quality of her pain suggested mesenteric ischemia. She has also been seen by vascular surgery at [facility]. They did not feel that she would benefit from surgery. Previous cough with ACEI. Bradycardia with higher doses of beta blocker. Patient had lifeline screening previously and her carotids were normal, no aneurysm and normal ABIs.Since last seen,  there is no dyspnea, chest pain, palpitations or syncope.  She does describe fatigue.  Current Outpatient Medications  Medication Sig Dispense Refill  . ALPRAZolam (XANAX) 0.5 MG tablet Take 0.25-0.5 mg by mouth 2 (two) times daily. Pt takes a full tab at night and whole a tab during day    . amLODipine (NORVASC) 5 MG tablet TAKE 1 TABLET BY MOUTH EVERY DAY (Patient taking differently: Take 2.5 mg by mouth daily. ) 90 tablet 3  . aspirin 81 MG tablet Take 81 mg by mouth daily.     Marland Kitchen atorvastatin (LIPITOR) 10 MG tablet TAKE 1 TABLET BY  MOUTH EVERY DAY 90 tablet 2  . bacitracin ophthalmic ointment Place 1 application into both eyes as needed. Dry eyes  0  . cephALEXin (KEFLEX) 250 MG capsule Take 250 mg by mouth daily.    Marland Kitchen estrogens, conjugated, (PREMARIN) 0.45 MG tablet Take 0.45 mg by mouth daily.     Marland Kitchen losartan (COZAAR) 50 MG tablet TAKE 2 TABLETS BY MOUTH EVERY DAY 180 tablet 0  . LOTEMAX 0.5 % ophthalmic suspension Place 1 drop into both eyes daily. As needed. For dry eyes    . magnesium hydroxide (MILK OF MAGNESIA) 400 MG/5ML suspension Take by mouth daily as needed for moderate constipation.     . metoprolol succinate (TOPROL-XL) 25 MG 24 hr tablet TAKE 1/2 TABLET EVERY DAY 30 tablet 4  . Multiple Vitamin (MULTIVITAMIN) capsule Take 1 capsule by mouth daily.    . Omega-3 Fatty Acids (FISH OIL PO) Take 1 tablet by mouth daily.    . polyethylene glycol (MIRALAX / GLYCOLAX) packet Take 17 g by mouth daily as needed.    . sertraline (ZOLOFT) 25 MG tablet TAKE 1 TABLET BY MOUTH EVERY DAY 90 tablet 1  . SYNTHROID 75 MCG tablet Take 75 mcg by mouth daily. Takes 112 mg daily except Saturday a half tab and Sunday no pill  1  . traZODone (DESYREL) 100 MG tablet Take 100 mg by mouth at bedtime.   0  . Wheat Dextrin (BENEFIBER DRINK MIX PO) Take by mouth. Two teaspoons daily    . zaleplon (SONATA) 5 MG capsule Take 1 capsule (  5 mg total) by mouth at bedtime as needed for sleep. 30 capsule 0   No current facility-administered medications for this visit.      Past Medical History:  Diagnosis Date  . Adenomatous colon polyp   . Adenomatous polyp of colon 12/03/2004   18mm  . Anxiety   . Chronic headaches   . Depression   . Diverticulosis   . External hemorrhoids   . Hyperlipidemia   . Hypertension   . Hypothyroidism   . Umbilical hernia     Past Surgical History:  Procedure Laterality Date  . ABDOMINAL HYSTERECTOMY  1975  . APPENDECTOMY  1975  . COLONOSCOPY  03/03/2010,12/03/2004  . ESOPHAGOGASTRODUODENOSCOPY  12/16/11    Dr. Silvano Rusk  . OOPHORECTOMY    . spinal lipoma removed  2006  . TOTAL ABDOMINAL HYSTERECTOMY      Social History   Socioeconomic History  . Marital status: Married    Spouse name: Not on file  . Number of children: 2  . Years of education: Not on file  . Highest education level: Not on file  Occupational History  . Occupation: retired  Scientific laboratory technician  . Financial resource strain: Not on file  . Food insecurity    Worry: Not on file    Inability: Not on file  . Transportation needs    Medical: Not on file    Non-medical: Not on file  Tobacco Use  . Smoking status: Former Smoker    Types: Cigarettes    Quit date: 01/23/1993    Years since quitting: 26.4  . Smokeless tobacco: Never Used  Substance and Sexual Activity  . Alcohol use: Yes    Alcohol/week: 2.0 standard drinks    Types: 2 Glasses of wine per week  . Drug use: No  . Sexual activity: Not on file  Lifestyle  . Physical activity    Days per week: Not on file    Minutes per session: Not on file  . Stress: Not on file  Relationships  . Social Herbalist on phone: Not on file    Gets together: Not on file    Attends religious service: Not on file    Active member of club or organization: Not on file    Attends meetings of clubs or organizations: Not on file    Relationship status: Not on file  . Intimate partner violence    Fear of current or ex partner: Not on file    Emotionally abused: Not on file    Physically abused: Not on file    Forced sexual activity: Not on file  Other Topics Concern  . Not on file  Social History Narrative   Patient is married she is retired and has 2 children   About 2 alcoholic beverages a week no drugs, no current tobacco she is a former smoker    Family History  Problem Relation Age of Onset  . Esophageal cancer Father   . Stomach cancer Father        mets from esophagus  . Heart disease Mother   . Irritable bowel syndrome Mother   . Kidney disease  Mother   . Other Mother        cushings disease  . Addison's disease Mother   . Heart failure Mother   . Stroke Mother   . Hypertension Mother   . Stroke Maternal Grandmother   . Hypertension Maternal Grandmother   . Colon cancer Neg Hx   .  Heart attack Neg Hx     ROS: Burning and tingling in feet bilaterally but no fevers or chills, productive cough, hemoptysis, dysphasia, odynophagia, melena, hematochezia, dysuria, hematuria, rash, seizure activity, orthopnea, PND, pedal edema, claudication. Remaining systems are negative.  Physical Exam: Well-developed well-nourished in no acute distress.  Skin is warm and dry.  HEENT is normal.  Neck is supple.  Chest is clear to auscultation with normal expansion.  Cardiovascular exam is regular rate and rhythm.  Abdominal exam nontender or distended. No masses palpated. Extremities show no edema. neuro grossly intact  ECG- sinus rhythm at a rate of 59, no ST changes.  Personally reviewed  A/P  1 hypertension-patient's blood pressure is controlled.  Continue present medications and follow.  She does have some fatigue.  We could consider discontinuing beta-blocker in the future to see if symptoms improve but I explained that palpitations may worsen.  2 history of palpitations-well controlled.  Continue beta-blocker.  3 hyperlipidemia-continue statin.  4 chronic abdominal pain-followed by gastroenterology and primary care.  Kirk Ruths, MD

## 2019-07-16 ENCOUNTER — Other Ambulatory Visit: Payer: Self-pay | Admitting: Cardiology

## 2019-07-18 ENCOUNTER — Other Ambulatory Visit: Payer: Self-pay | Admitting: Cardiology

## 2019-07-24 ENCOUNTER — Telehealth: Payer: Self-pay | Admitting: Cardiology

## 2019-07-24 NOTE — Telephone Encounter (Signed)
If patient is currently experiencing edema and difficulty urinating, she should contact PCP or urologist for complete assessment and recommendations. Using HCTZ as needed for difficulty urinating is not an appropriate management.  During most recent OV with DR Stanford Breed her BP and palpitations were under control. No medication changes were recommended and no HCTZ was active on her medical record.

## 2019-07-24 NOTE — Telephone Encounter (Signed)
New message   Patient has a question about reinstating her prescription for hydrochlorothiazide. Please call to discuss.

## 2019-07-24 NOTE — Telephone Encounter (Signed)
Called patient, spoke with husband. Advised of message below- he will let her know and advised her to call back if any further questions.

## 2019-07-24 NOTE — Telephone Encounter (Signed)
Called patient, she states that the last prescription she had for HCTZ was over a year ago- she states she feels she needs a diuretic because her feet swell all the time- she has difficulty urinating and she thinks that this would help. She would like to know if it was okay to take PRN - BP has been okay per patient did not give range, she states that she uses them as needed now- but the RX is out and she would like to know if she can get the RX refilled. Patient denies cardiac issues- she denies changes in diet/ she was asked if she elevates her feet at night and she did not answer- only states that she has had this issue for a long time- and has the occasional swelling.  Advised message would be sent back to be advised.

## 2019-08-09 ENCOUNTER — Other Ambulatory Visit: Payer: Self-pay | Admitting: Cardiology

## 2019-08-13 ENCOUNTER — Telehealth: Payer: Self-pay | Admitting: Neurology

## 2019-08-13 NOTE — Telephone Encounter (Addendum)
Pt is asking for a call to discuss  Numbness in feet,foot & leg pain, shaking and balance affected.  Pt states these are symptoms she initially had when she first came to see Dr Brett Fairy.  Pt wants is known she is now taking Gabapentin.

## 2019-08-15 NOTE — Telephone Encounter (Signed)
Called the patient and discussed concerns with her. She states that she has been having increase in the numbness and tingling sensation. She is questioning why and what could be causing this discomfort still to this day. Over the weekend her PCP called in gabapentin which she is taking and per Dr Charleen Kirks can take up to TID. Advised the patient that neuropathy pains can be lifelong and that its a matter of finding the right medication to help control the discomfort. Informed her we can set up a apt with NP to discuss this further. Patient accepted an apt 10/22 at 11 am with check in of 10:30 am

## 2019-08-21 ENCOUNTER — Other Ambulatory Visit: Payer: Self-pay | Admitting: Neurology

## 2019-08-21 ENCOUNTER — Ambulatory Visit: Payer: Self-pay | Admitting: Adult Health

## 2019-08-23 ENCOUNTER — Encounter: Payer: Self-pay | Admitting: Adult Health

## 2019-08-23 ENCOUNTER — Ambulatory Visit (INDEPENDENT_AMBULATORY_CARE_PROVIDER_SITE_OTHER): Payer: Medicare Other | Admitting: Adult Health

## 2019-08-23 ENCOUNTER — Other Ambulatory Visit: Payer: Self-pay

## 2019-08-23 VITALS — BP 122/68 | HR 52 | Temp 98.1°F | Ht 64.5 in | Wt 151.0 lb

## 2019-08-23 DIAGNOSIS — G479 Sleep disorder, unspecified: Secondary | ICD-10-CM | POA: Diagnosis not present

## 2019-08-23 DIAGNOSIS — G629 Polyneuropathy, unspecified: Secondary | ICD-10-CM | POA: Diagnosis not present

## 2019-08-23 DIAGNOSIS — F329 Major depressive disorder, single episode, unspecified: Secondary | ICD-10-CM

## 2019-08-23 DIAGNOSIS — R350 Frequency of micturition: Secondary | ICD-10-CM | POA: Diagnosis not present

## 2019-08-23 DIAGNOSIS — F32A Depression, unspecified: Secondary | ICD-10-CM

## 2019-08-23 MED ORDER — SERTRALINE HCL 50 MG PO TABS: 50.0000 mg | ORAL_TABLET | Freq: Every day | ORAL | 3 refills | Status: DC

## 2019-08-23 NOTE — Progress Notes (Signed)
PATIENT: Debbie Wells DOB: [birth year]  REASON FOR VISIT: follow up HISTORY FROM: patient  HISTORY OF PRESENT ILLNESS: Today 08/23/19:  Debbie Wells is a 78 year old female with a history of neuropathy and sleep disturbance.  She returns today for follow-up.  Debbie Wells has questions concerning her EMG and diagnosis of neuropathy and ongoing discomfort.  I reviewed her nerve conduction study and EMG with her.  She has been placed on gabapentin by her PCP.  She states that taking 100 mg at bedtime has offered her some benefit but has not resolved her discomfort.  She states that she did try taking it during the day for a couple days but it caused drowsiness.  The patient states that she has been under a lot of stress with the pandemic and also losing a best friend.  She states that she is struggling with depression.  She is currently on Zoloft 25 mg at bedtime.  She also notes that since she started gabapentin she has had urinary frequency.  She is not sure if this is a side effect or coincidence.  She denies any burning or tingling with urination.  She reports some changes with her gait and balance.  She states that she feels unsteady but fortunately has not had any falls.  She returns today for an evaluation.   HISTORY 04-30-2019 interval visit with this 78 year old caucasian, right handed female patient who underwent EMG and NCV with dr Jannifer Franklin here at [facility]. She had a significant improvement in sleep since November 2019 , but some symptoms of neuropathy had been persistently present. The NCV and EMG did not indicate any NP or radiculopathy. She reports being more anxious, attributed this to the Coronavirus pandemic. She wakes up early and can't go back to sleep.  seroquel did not work well, felt groggy and hypervigilant- the opposite that is was intended to do.  She has no indication that her thyroid function is impaired or imbalanced other than she believes she has hair loss, she showed me that  fingernails have changed, these are ridged. .  Dr. Forde Dandy confirmed normal labs- free T4 and TSH, and a slight anemia.-  in a virtual visit on May 28th .  I am not sure what else I can do fer her- we could use a short acting sleep aid for early morning arousals, start biotin and treat anxiety with CBT, plus: have her endocrinologist follow her thyroid function.   REVIEW OF SYSTEMS: Out of a complete 14 system review of symptoms, the patient complains only of the following symptoms, and all other reviewed systems are negative.  See HPI ALLERGIES: Allergies  Allergen Reactions  . Iodinated Diagnostic Agents Hives    Per patient she has developed hives and itching in upper trunk of body after last 2 CT scans once she got homes that she has just treated at home with benadryl. She has not told anyone until now. States she was able to bear it   . Benazepril Cough  . Hydrocodone-Acetaminophen     Vicodin=REACTION: rash  . Sulfonamide Derivatives     REACTION: rash  . Hydrocodone-Acetaminophen Rash    HOME MEDICATIONS: Outpatient Medications Prior to Visit  Medication Sig Dispense Refill  . ALPRAZolam (XANAX) 0.5 MG tablet Take 0.25-0.5 mg by mouth 2 (two) times daily. Pt takes a full tab at night and whole a tab during day    . amLODipine (NORVASC) 5 MG tablet TAKE 1 TABLET BY MOUTH EVERY DAY (  Patient taking differently: Take 2.5 mg by mouth daily. ) 90 tablet 3  . aspirin 81 MG tablet Take 81 mg by mouth daily.     Marland Kitchen atorvastatin (LIPITOR) 10 MG tablet TAKE 1 TABLET BY MOUTH EVERY DAY 90 tablet 2  . bacitracin ophthalmic ointment Place 1 application into both eyes as needed. Dry eyes  0  . cephALEXin (KEFLEX) 250 MG capsule Take 250 mg by mouth daily.    Marland Kitchen estrogens, conjugated, (PREMARIN) 0.45 MG tablet Take 0.45 mg by mouth daily.     Marland Kitchen gabapentin (NEURONTIN) 100 MG capsule TAKE 1 CAPSULE AT NIGHT TO SEE IF HELPS WITH NEUROPATHIC TYPE PAIN.    Marland Kitchen losartan (COZAAR) 50 MG tablet TAKE 2 TABLETS  BY MOUTH EVERY DAY 180 tablet 0  . LOTEMAX 0.5 % ophthalmic suspension Place 1 drop into both eyes daily. As needed. For dry eyes    . magnesium hydroxide (MILK OF MAGNESIA) 400 MG/5ML suspension Take by mouth daily as needed for moderate constipation.     . metoprolol succinate (TOPROL-XL) 25 MG 24 hr tablet TAKE 1/2 TABLET EVERY DAY 30 tablet 4  . Multiple Vitamin (MULTIVITAMIN) capsule Take 1 capsule by mouth daily.    . Omega-3 Fatty Acids (FISH OIL PO) Take 1 tablet by mouth daily.    . polyethylene glycol (MIRALAX / GLYCOLAX) packet Take 17 g by mouth daily as needed.    . sertraline (ZOLOFT) 25 MG tablet TAKE 1 TABLET BY MOUTH EVERY DAY 90 tablet 1  . SYNTHROID 75 MCG tablet Take 75 mcg by mouth daily. Takes 112 mg daily except Saturday a half tab and Sunday no pill  1  . traZODone (DESYREL) 100 MG tablet Take 100 mg by mouth at bedtime.   0  . Wheat Dextrin (BENEFIBER DRINK MIX PO) Take by mouth. Two teaspoons daily    . zaleplon (SONATA) 5 MG capsule Take 1 capsule (5 mg total) by mouth at bedtime as needed for sleep. 30 capsule 0   No facility-administered medications prior to visit.     PAST MEDICAL HISTORY: Past Medical History:  Diagnosis Date  . Adenomatous colon polyp   . Adenomatous polyp of colon 12/03/2004   103mm  . Anxiety   . Chronic headaches   . Depression   . Diverticulosis   . External hemorrhoids   . Hyperlipidemia   . Hypertension   . Hypothyroidism   . Umbilical hernia     PAST SURGICAL HISTORY: Past Surgical History:  Procedure Laterality Date  . ABDOMINAL HYSTERECTOMY  1975  . APPENDECTOMY  1975  . COLONOSCOPY  03/03/2010,12/03/2004  . ESOPHAGOGASTRODUODENOSCOPY  12/16/11   Dr. Silvano Rusk  . OOPHORECTOMY    . spinal lipoma removed  2006  . TOTAL ABDOMINAL HYSTERECTOMY      FAMILY HISTORY: Family History  Problem Relation Age of Onset  . Esophageal cancer Father   . Stomach cancer Father        mets from esophagus  . Heart disease Mother   .  Irritable bowel syndrome Mother   . Kidney disease Mother   . Other Mother        cushings disease  . Addison's disease Mother   . Heart failure Mother   . Stroke Mother   . Hypertension Mother   . Stroke Maternal Grandmother   . Hypertension Maternal Grandmother   . Colon cancer Neg Hx   . Heart attack Neg Hx     SOCIAL HISTORY: Social History  Socioeconomic History  . Marital status: Married    Spouse name: Not on file  . Number of children: 2  . Years of education: Not on file  . Highest education level: Not on file  Occupational History  . Occupation: retired  Scientific laboratory technician  . Financial resource strain: Not on file  . Food insecurity    Worry: Not on file    Inability: Not on file  . Transportation needs    Medical: Not on file    Non-medical: Not on file  Tobacco Use  . Smoking status: Former Smoker    Types: Cigarettes    Quit date: 01/23/1993    Years since quitting: 26.5  . Smokeless tobacco: Never Used  Substance and Sexual Activity  . Alcohol use: Yes    Alcohol/week: 2.0 standard drinks    Types: 2 Glasses of wine per week  . Drug use: No  . Sexual activity: Not on file  Lifestyle  . Physical activity    Days per week: Not on file    Minutes per session: Not on file  . Stress: Not on file  Relationships  . Social Herbalist on phone: Not on file    Gets together: Not on file    Attends religious service: Not on file    Active member of club or organization: Not on file    Attends meetings of clubs or organizations: Not on file    Relationship status: Not on file  . Intimate partner violence    Fear of current or ex partner: Not on file    Emotionally abused: Not on file    Physically abused: Not on file    Forced sexual activity: Not on file  Other Topics Concern  . Not on file  Social History Narrative   Patient is married she is retired and has 2 children   About 2 alcoholic beverages a week no drugs, no current tobacco she is a  former smoker      PHYSICAL EXAM  Vitals:   08/23/19 1100 08/23/19 1148  BP: (!) 162/78 122/68  Pulse: 60 (!) 52  Temp: 98.1 F (36.7 C)   Weight: 151 lb (68.5 kg)   Height: 5' 4.5" (1.638 m)    Body mass index is 25.52 kg/m.  Generalized: Well developed, in no acute distress   Neurological examination  Mentation: Alert oriented to time, place, history taking. Follows all commands speech and language fluent.  Tearful Cranial nerve II-XII: Pupils were equal round reactive to light. Extraocular movements were full, visual field were full on confrontational test.  Head turning and shoulder shrug  were normal and symmetric. Motor: The motor testing reveals 5 over 5 strength of all 4 extremities. Good symmetric motor tone is noted throughout.  Sensory: Sensory testing is intact to soft touch on all 4 extremities. No evidence of extinction is noted.  Coordination: Cerebellar testing reveals good finger-nose-finger and heel-to-shin bilaterally.  Gait and station: Gait is normal.  Reflexes: Deep tendon reflexes are symmetric and normal bilaterally.   DIAGNOSTIC DATA (LABS, IMAGING, TESTING) - I reviewed patient records, labs, notes, testing and imaging myself where available.  Lab Results  Component Value Date   WBC 6.7 11/14/2012   HGB 13.9 11/14/2012   HCT 41.5 11/14/2012   MCV 90.2 11/14/2012   PLT 229 11/14/2012      Component Value Date/Time   NA 142 12/28/2018 1515   K 4.4 12/28/2018 1515   CL  102 12/28/2018 1515   CO2 23 12/28/2018 1515   GLUCOSE 89 12/28/2018 1515   GLUCOSE 89 08/28/2013 1111   BUN 10 12/28/2018 1515   CREATININE 0.87 12/28/2018 1515   CALCIUM 9.9 12/28/2018 1515   PROT 7.0 12/28/2018 1515   ALBUMIN 4.8 (H) 12/28/2018 1515   AST 27 12/28/2018 1515   ALT 19 12/28/2018 1515   ALKPHOS 97 12/28/2018 1515   BILITOT 0.3 12/28/2018 1515   GFRNONAA 65 12/28/2018 1515   GFRAA 75 12/28/2018 1515   No results found for: CHOL, HDL, LDLCALC, LDLDIRECT,  TRIG, CHOLHDL No results found for: HGBA1C No results found for: VITAMINB12 Lab Results  Component Value Date   TSH 1.710 04/30/2019      ASSESSMENT AND PLAN 78 y.o. year old female  has a past medical history of Adenomatous colon polyp, Adenomatous polyp of colon (12/03/2004), Anxiety, Chronic headaches, Depression, Diverticulosis, External hemorrhoids, Hyperlipidemia, Hypertension, Hypothyroidism, and Umbilical hernia. here with:  1.  Neuropathy 2.  Depression 3.  Sleep disturbance 4. Urinary Frequency  The patient will continue on gabapentin.  I advised that she could try taking 1 tablet in the morning and 1 tablet in the evening.  I did advise that typically the side effects subside the longer that she takes the medication.  Patient reports that she will try it again.  I will increase Zoloft to 50 mg at bedtime.She is on trazodone for sleep. We discussed the potential for serotonin syndrome with Zoloft and trazodone.  I went over the signs and symptoms of this.  I will also check a urinalysis for urinary frequency.  She will follow-up in January with Dr. Brett Fairy.      Ward Givens, MSN, NP-C 08/23/2019, 10:51 AM [facility] [address] [place], [place] [address] [phone]

## 2019-08-23 NOTE — Patient Instructions (Signed)
Increase Zoloft to 50 mg  Continue Gabapentin. Can take 1 tablet twice a day If your symptoms worsen or you develop new symptoms please let us know.

## 2019-08-24 LAB — MICROSCOPIC EXAMINATION
Bacteria, UA: NONE SEEN
Casts: NONE SEEN /lpf

## 2019-08-24 LAB — URINALYSIS, ROUTINE W REFLEX MICROSCOPIC
Bilirubin, UA: NEGATIVE
Glucose, UA: NEGATIVE
Ketones, UA: NEGATIVE
Nitrite, UA: NEGATIVE
Protein,UA: NEGATIVE
RBC, UA: NEGATIVE
Specific Gravity, UA: 1.007 (ref 1.005–1.030)
Urobilinogen, Ur: 0.2 mg/dL (ref 0.2–1.0)
pH, UA: 7.5 (ref 5.0–7.5)

## 2019-08-27 ENCOUNTER — Telehealth: Payer: Self-pay

## 2019-08-27 NOTE — Telephone Encounter (Signed)
Called pt to inform her about her recent lab results...  "Notes recorded by Ward Givens, NP on 08/27/2019 at 8:11 AM EDT  Labs results are unremarkable. Please call patient with results. "  -Per NP Ward Givens.

## 2019-11-01 ENCOUNTER — Other Ambulatory Visit: Payer: Self-pay | Admitting: Cardiology

## 2019-11-07 ENCOUNTER — Ambulatory Visit: Payer: Medicare Other | Admitting: Neurology

## 2019-11-17 ENCOUNTER — Ambulatory Visit: Payer: Medicare Other | Attending: Internal Medicine

## 2019-11-17 DIAGNOSIS — Z23 Encounter for immunization: Secondary | ICD-10-CM | POA: Insufficient documentation

## 2019-11-17 NOTE — Progress Notes (Signed)
   Covid-19 Vaccination Clinic  Name:  Debbie Wells    MRN: [id number] DOB: [birth year]  11/17/2019  Debbie Wells was observed post Covid-19 immunization for 15 minutes without incidence. She was provided with Vaccine Information Sheet and instruction to access the V-Safe system.   Debbie Wells was instructed to call 911 with any severe reactions post vaccine: Marland Kitchen Difficulty breathing  . Swelling of your face and throat  . A fast heartbeat  . A bad rash all over your body  . Dizziness and weakness    Immunizations Administered    Name Date Dose VIS Date Route   Pfizer COVID-19 Vaccine 11/17/2019 10:30 AM 0.3 mL 10/12/2019 Intramuscular   Manufacturer: [employer], [employer]   Lot: [id number]   [place]: [id number]

## 2019-11-20 ENCOUNTER — Other Ambulatory Visit: Payer: Self-pay | Admitting: Cardiology

## 2019-12-01 ENCOUNTER — Other Ambulatory Visit: Payer: Self-pay | Admitting: Cardiology

## 2019-12-06 ENCOUNTER — Ambulatory Visit: Payer: Medicare Other | Attending: Internal Medicine

## 2019-12-06 DIAGNOSIS — Z23 Encounter for immunization: Secondary | ICD-10-CM | POA: Insufficient documentation

## 2019-12-06 NOTE — Progress Notes (Signed)
   Covid-19 Vaccination Clinic  Name:  JUDYE KIETZMAN    MRN: [id number] DOB: Apr 28, 1941  12/06/2019  Ms. Sabir was observed post Covid-19 immunization for 15 minutes without incidence. She was provided with Vaccine Information Sheet and instruction to access the V-Safe system.   Ms. Gunnin was instructed to call 911 with any severe reactions post vaccine: Marland Kitchen Difficulty breathing  . Swelling of your face and throat  . A fast heartbeat  . A bad rash all over your body  . Dizziness and weakness    Immunizations Administered    Name Date Dose VIS Date Route   Pfizer COVID-19 Vaccine 12/06/2019  2:48 PM 0.3 mL 10/12/2019 Intramuscular   Manufacturer: [place]   Lot: [id number]   [place]: [id number]

## 2020-01-07 DIAGNOSIS — N1831 Chronic kidney disease, stage 3a: Secondary | ICD-10-CM | POA: Insufficient documentation

## 2020-01-08 DIAGNOSIS — I129 Hypertensive chronic kidney disease with stage 1 through stage 4 chronic kidney disease, or unspecified chronic kidney disease: Secondary | ICD-10-CM | POA: Insufficient documentation

## 2020-01-10 ENCOUNTER — Telehealth: Payer: Self-pay | Admitting: Cardiology

## 2020-01-10 NOTE — Telephone Encounter (Signed)
New Message   Pt informed me that she has been diagnosed with stage 3 kidney disease. Pt is worried about the blood pressure medication that she is taking because of the diagnosis.   Please call to discuss

## 2020-01-10 NOTE — Telephone Encounter (Signed)
I do not think her meds are causing issues; will review at [facility]

## 2020-01-10 NOTE — Telephone Encounter (Signed)
Spoke with pt, she just found out yesterday and would like to know if any of her medications are causing a problem. Her medications were confirmed. Her bp usually runs around 130's/70's. She has a follow up appointment the end of this month. She wants to know if there is anything she needs to do prior to her appointment. Will forward for dr Stanford Breed review

## 2020-01-11 NOTE — Telephone Encounter (Signed)
Spoke with pt, Aware of dr crenshaw's recommendations.  °

## 2020-01-16 NOTE — Progress Notes (Signed)
HPI: FU hypertension. She has a history of hypertension which has been somewhat difficult to control.She had previously worn an outpatient 24-hour blood pressure monitor which showed wide swings of blood pressure.She has had a 24-hour urine collection for metanephrines which was normal. She has had blood work in Dr. Jerilynn Som which has ruled out Cushing's syndrome.Her cardiac workup has included a Lexiscan Myoview stress test on 12/07/12 showing no evidence of ischemia and her ejection fraction was 60% and there were no wall motion abnormalities. She had a previous echocardiogram 09/17/11 showing normal LV function and mild mitral regurgitation. Renal dopplers 2014 showed mild 1-59 right RAS. She has had problems with chronic steady pain in the left epigastrium and left upper quadrant and has seen her gastroenterologist Dr. Carlean Purl. She has seen Dr. Mamie Nick regarding question of mesenteric and or celiac artery stenosis as a possible cause of her left upper quadrant pain. Dr. Kellie Simmering did not feel that the quality of her pain suggested mesenteric ischemia. She has also been seen by vascular surgery at [facility]. They did not feel that she would benefit from surgery. Previous cough with ACEI. Bradycardia with higher doses of beta blocker. Patient had lifeline screening previously and her carotids were normal, no aneurysm and normal ABIs.Since last seen,she denies dyspnea, chest pain, palpitations or syncope.  She recently was diagnosed with stage III kidney disease and is concerned.  Current Outpatient Medications  Medication Sig Dispense Refill  . ALPRAZolam (XANAX) 0.5 MG tablet Take 0.25-0.5 mg by mouth 2 (two) times daily. Pt takes a full tab at night and whole a tab during day    . amLODipine (NORVASC) 5 MG tablet TAKE 1 TABLET BY MOUTH EVERY DAY (Patient taking differently: Take 5 mg by mouth 2 (two) times daily. ) 90 tablet 3  . aspirin 81 MG tablet Take 81 mg by mouth daily.      . bacitracin ophthalmic ointment Place 1 application into both eyes as needed. Dry eyes  0  . estrogens, conjugated, (PREMARIN) 0.45 MG tablet Take 0.45 mg by mouth daily.     Marland Kitchen gabapentin (NEURONTIN) 100 MG capsule TAKE 1 CAPSULE AT NIGHT TO SEE IF HELPS WITH NEUROPATHIC TYPE PAIN.    Marland Kitchen losartan (COZAAR) 50 MG tablet Take 2 tablets (100 mg total) by mouth daily. Keep upcoming appointment for refills 60 tablet 5  . LOTEMAX 0.5 % ophthalmic suspension Place 1 drop into both eyes daily. As needed. For dry eyes    . magnesium hydroxide (MILK OF MAGNESIA) 400 MG/5ML suspension Take by mouth daily as needed for moderate constipation.     . metoprolol succinate (TOPROL-XL) 25 MG 24 hr tablet TAKE 1/2 TABLET BY MOUTH EVERY DAY 45 tablet 3  . Multiple Vitamin (MULTIVITAMIN) capsule Take 1 capsule by mouth daily.    . Omega-3 Fatty Acids (FISH OIL PO) Take 1 tablet by mouth daily.    . polyethylene glycol (MIRALAX / GLYCOLAX) packet Take 17 g by mouth daily as needed.    Marland Kitchen SYNTHROID 75 MCG tablet Take 75 mcg by mouth daily.   1  . traZODone (DESYREL) 100 MG tablet Take 100 mg by mouth at bedtime.   0  . Wheat Dextrin (BENEFIBER DRINK MIX PO) Take by mouth as needed. Two teaspoons     No current facility-administered medications for this visit.     Past Medical History:  Diagnosis Date  . Adenomatous colon polyp   . Adenomatous polyp of colon  12/03/2004   47mm  . Anxiety   . Chronic headaches   . Depression   . Diverticulosis   . External hemorrhoids   . Hyperlipidemia   . Hypertension   . Hypothyroidism   . Umbilical hernia     Past Surgical History:  Procedure Laterality Date  . ABDOMINAL HYSTERECTOMY  1975  . APPENDECTOMY  1975  . COLONOSCOPY  03/03/2010,12/03/2004  . ESOPHAGOGASTRODUODENOSCOPY  12/16/11   Dr. Silvano Rusk  . OOPHORECTOMY    . spinal lipoma removed  2006  . TOTAL ABDOMINAL HYSTERECTOMY      Social History   Socioeconomic History  . Marital status: Married     Spouse name: Not on file  . Number of children: 2  . Years of education: Not on file  . Highest education level: Not on file  Occupational History  . Occupation: retired  Tobacco Use  . Smoking status: Former Smoker    Types: Cigarettes    Quit date: 01/23/1993    Years since quitting: 27.0  . Smokeless tobacco: Never Used  Substance and Sexual Activity  . Alcohol use: Yes    Alcohol/week: 2.0 standard drinks    Types: 2 Glasses of wine per week  . Drug use: No  . Sexual activity: Not on file  Other Topics Concern  . Not on file  Social History Narrative   Patient is married she is retired and has 2 children   About 2 alcoholic beverages a week no drugs, no current tobacco she is a former smoker      Lives at home with her husband   Right handed   Social Determinants of Health   Financial Resource Strain:   . Difficulty of Paying Living Expenses:   Food Insecurity:   . Worried About Charity fundraiser in the Last Year:   . Arboriculturist in the Last Year:   Transportation Needs:   . Film/video editor (Medical):   Marland Kitchen Lack of Transportation (Non-Medical):   Physical Activity:   . Days of Exercise per Week:   . Minutes of Exercise per Session:   Stress:   . Feeling of Stress :   Social Connections:   . Frequency of Communication with Friends and Family:   . Frequency of Social Gatherings with Friends and Family:   . Attends Religious Services:   . Active Member of Clubs or Organizations:   . Attends Archivist Meetings:   Marland Kitchen Marital Status:   Intimate Partner Violence:   . Fear of Current or Ex-Partner:   . Emotionally Abused:   Marland Kitchen Physically Abused:   . Sexually Abused:     Family History  Problem Relation Age of Onset  . Esophageal cancer Father   . Stomach cancer Father        mets from esophagus  . Heart disease Mother   . Irritable bowel syndrome Mother   . Kidney disease Mother   . Other Mother        cushings disease  . Addison's  disease Mother   . Heart failure Mother   . Stroke Mother   . Hypertension Mother   . Stroke Maternal Grandmother   . Hypertension Maternal Grandmother   . Colon cancer Neg Hx   . Heart attack Neg Hx     ROS: no fevers or chills, productive cough, hemoptysis, dysphasia, odynophagia, melena, hematochezia, dysuria, hematuria, rash, seizure activity, orthopnea, PND, pedal edema, claudication. Remaining systems are negative.  Physical Exam: Well-developed well-nourished in no acute distress.  Skin is warm and dry.  HEENT is normal.  Neck is supple.  Chest is clear to auscultation with normal expansion.  Cardiovascular exam is regular rate and rhythm.  Abdominal exam nontender or distended. No masses palpated. Extremities show no edema. neuro grossly intact   A/P  1 hypertension-blood pressure is controlled.  Continue present medications and follow.  2 palpitations-symptoms are controlled.  Continue beta-blocker.  3 hyperlipidemia-Followed by primary care.  4 chronic abdominal pain-Per gastroenterology.  Kirk Ruths, MD

## 2020-01-28 ENCOUNTER — Ambulatory Visit: Payer: Medicare PPO | Admitting: Cardiology

## 2020-01-28 ENCOUNTER — Other Ambulatory Visit: Payer: Self-pay

## 2020-01-28 ENCOUNTER — Encounter: Payer: Self-pay | Admitting: Cardiology

## 2020-01-28 VITALS — BP 125/73 | HR 74 | Temp 97.2°F | Ht 64.0 in | Wt 152.4 lb

## 2020-01-28 DIAGNOSIS — R002 Palpitations: Secondary | ICD-10-CM

## 2020-01-28 DIAGNOSIS — I10 Essential (primary) hypertension: Secondary | ICD-10-CM

## 2020-01-28 DIAGNOSIS — E785 Hyperlipidemia, unspecified: Secondary | ICD-10-CM | POA: Diagnosis not present

## 2020-01-28 NOTE — Patient Instructions (Signed)

## 2020-03-14 ENCOUNTER — Other Ambulatory Visit: Payer: Self-pay | Admitting: Cardiology

## 2020-03-15 ENCOUNTER — Other Ambulatory Visit: Payer: Self-pay | Admitting: Neurology

## 2020-03-20 ENCOUNTER — Encounter: Payer: Self-pay | Admitting: Neurology

## 2020-03-20 ENCOUNTER — Ambulatory Visit: Payer: Medicare PPO | Admitting: Neurology

## 2020-03-20 ENCOUNTER — Other Ambulatory Visit: Payer: Self-pay

## 2020-03-20 VITALS — BP 130/66 | HR 61 | Ht 64.5 in | Wt 151.0 lb

## 2020-03-20 DIAGNOSIS — F418 Other specified anxiety disorders: Secondary | ICD-10-CM | POA: Diagnosis not present

## 2020-03-20 DIAGNOSIS — G63 Polyneuropathy in diseases classified elsewhere: Secondary | ICD-10-CM

## 2020-03-20 DIAGNOSIS — E079 Disorder of thyroid, unspecified: Secondary | ICD-10-CM | POA: Diagnosis not present

## 2020-03-20 DIAGNOSIS — F5104 Psychophysiologic insomnia: Secondary | ICD-10-CM | POA: Diagnosis not present

## 2020-03-20 NOTE — Patient Instructions (Signed)
Neuropathic Pain Neuropathic pain is pain caused by damage to the nerves that are responsible for certain sensations in your body (sensory nerves). The pain can be caused by:  Damage to the sensory nerves that send signals to your spinal cord and brain (peripheral nervous system).  Damage to the sensory nerves in your brain or spinal cord (central nervous system). Neuropathic pain can make you more sensitive to pain. Even a minor sensation can feel very painful. This is usually a long-term condition that can be difficult to treat. The type of pain differs from person to person. It may:  Start suddenly (acute), or it may develop slowly and last for a long time (chronic).  Come and go as damaged nerves heal, or it may stay at the same level for years.  Cause emotional distress, loss of sleep, and a lower quality of life. What are the causes? The most common cause of this condition is diabetes. Many other diseases and conditions can also cause neuropathic pain. Causes of neuropathic pain can be classified as:  Toxic. This is caused by medicines and chemicals. The most common cause of toxic neuropathic pain is damage from cancer treatments (chemotherapy).  Metabolic. This can be caused by: ? Diabetes. This is the most common disease that damages the nerves. ? Lack of vitamin B from long-term alcohol abuse.  Traumatic. Any injury that cuts, crushes, or stretches a nerve can cause damage and pain. A common example is feeling pain after losing an arm or leg (phantom limb pain).  Compression-related. If a sensory nerve gets trapped or compressed for a long period of time, the blood supply to the nerve can be cut off.  Vascular. Many blood vessel diseases can cause neuropathic pain by decreasing blood supply and oxygen to nerves.  Autoimmune. This type of pain results from diseases in which the body's defense system (immune system) mistakenly attacks sensory nerves. Examples of autoimmune diseases  that can cause neuropathic pain include lupus and multiple sclerosis.  Infectious. Many types of viral infections can damage sensory nerves and cause pain. Shingles infection is a common cause of this type of pain.  Inherited. Neuropathic pain can be a symptom of many diseases that are passed down through families (genetic). What increases the risk? You are more likely to develop this condition if:  You have diabetes.  You smoke.  You drink too much alcohol.  You are taking certain medicines, including medicines that kill cancer cells (chemotherapy) or that treat immune system disorders. What are the signs or symptoms? The main symptom is pain. Neuropathic pain is often described as:  Burning.  Shock-like.  Stinging.  Hot or cold.  Itching. How is this diagnosed? No single test can diagnose neuropathic pain. It is diagnosed based on:  Physical exam and your symptoms. Your health care provider will ask you about your pain. You may be asked to use a pain scale to describe how bad your pain is.  Tests. These may be done to see if you have a high sensitivity to pain and to help find the cause and location of any sensory nerve damage. They include: ? Nerve conduction studies to test how well nerve signals travel through your sensory nerves (electrodiagnostic testing). ? Stimulating your sensory nerves through electrodes on your skin and measuring the response in your spinal cord and brain (somatosensory evoked potential).  Imaging studies, such as: ? X-rays. ? CT scan. ? MRI. How is this treated? Treatment for neuropathic pain may change   over time. You may need to try different treatment options or a combination of treatments. Some options include:  Treating the underlying cause of the neuropathy, such as diabetes, kidney disease, or vitamin deficiencies.  Stopping medicines that can cause neuropathy, such as chemotherapy.  Medicine to relieve pain. Medicines may  include: ? Prescription or over-the-counter pain medicine. ? Anti-seizure medicine. ? Antidepressant medicines. ? Pain-relieving patches that are applied to painful areas of skin. ? A medicine to numb the area (local anesthetic), which can be injected as a nerve block.  Transcutaneous nerve stimulation. This uses electrical currents to block painful nerve signals. The treatment is painless.  Alternative treatments, such as: ? Acupuncture. ? Meditation. ? Massage. ? Physical therapy. ? Pain management programs. ? Counseling. Follow these instructions at home: Medicines   Take over-the-counter and prescription medicines only as told by your health care provider.  Do not drive or use heavy machinery while taking prescription pain medicine.  If you are taking prescription pain medicine, take actions to prevent or treat constipation. Your health care provider may recommend that you: ? Drink enough fluid to keep your urine pale yellow. ? Eat foods that are high in fiber, such as fresh fruits and vegetables, whole grains, and beans. ? Limit foods that are high in fat and processed sugars, such as fried or sweet foods. ? Take an over-the-counter or prescription medicine for constipation. Lifestyle   Have a good support system at home.  Consider joining a chronic pain support group.  Do not use any products that contain nicotine or tobacco, such as cigarettes and e-cigarettes. If you need help quitting, ask your health care provider.  Do not drink alcohol. General instructions  Learn as much as you can about your condition.  Work closely with all your health care providers to find the treatment plan that works best for you.  Ask your health care provider what activities are safe for you.  Keep all follow-up visits as told by your health care provider. This is important. Contact a health care provider if:  Your pain treatments are not working.  You are having side effects  from your medicines.  You are struggling with tiredness (fatigue), mood changes, depression, or anxiety. Summary  Neuropathic pain is pain caused by damage to the nerves that are responsible for certain sensations in your body (sensory nerves).  Neuropathic pain may come and go as damaged nerves heal, or it may stay at the same level for years.  Neuropathic pain is usually a long-term condition that can be difficult to treat. Consider joining a chronic pain support group. This information is not intended to replace advice given to you by your health care provider. Make sure you discuss any questions you have with your health care provider. Document Revised: 02/08/2019 Document Reviewed: 11/04/2017 Elsevier Patient Education  [place]. Insomnia Insomnia is a sleep disorder that makes it difficult to fall asleep or stay asleep. Insomnia can cause fatigue, low energy, difficulty concentrating, mood swings, and poor performance at work or school. There are three different ways to classify insomnia:  Difficulty falling asleep.  Difficulty staying asleep.  Waking up too early in the morning. Any type of insomnia can be long-term (chronic) or short-term (acute). Both are common. Short-term insomnia usually lasts for three months or less. Chronic insomnia occurs at least three times a week for longer than three months. What are the causes? Insomnia may be caused by another condition, situation, or substance, such as:  Anxiety.  Certain medicines.  Gastroesophageal reflux disease (GERD) or other gastrointestinal conditions.  Asthma or other breathing conditions.  Restless legs syndrome, sleep apnea, or other sleep disorders.  Chronic pain.  Menopause.  Stroke.  Abuse of alcohol, tobacco, or illegal drugs.  Mental health conditions, such as depression.  Caffeine.  Neurological disorders, such as Alzheimer's disease.  An overactive thyroid  (hyperthyroidism). Sometimes, the cause of insomnia may not be known. What increases the risk? Risk factors for insomnia include:  Gender. Women are affected more often than men.  Age. Insomnia is more common as you get older.  Stress.  Lack of exercise.  Irregular work schedule or working night shifts.  Traveling between different time zones.  Certain medical and mental health conditions. What are the signs or symptoms? If you have insomnia, the main symptom is having trouble falling asleep or having trouble staying asleep. This may lead to other symptoms, such as:  Feeling fatigued or having low energy.  Feeling nervous about going to sleep.  Not feeling rested in the morning.  Having trouble concentrating.  Feeling irritable, anxious, or depressed. How is this diagnosed? This condition may be diagnosed based on:  Your symptoms and medical history. Your health care provider may ask about: ? Your sleep habits. ? Any medical conditions you have. ? Your mental health.  A physical exam. How is this treated? Treatment for insomnia depends on the cause. Treatment may focus on treating an underlying condition that is causing insomnia. Treatment may also include:  Medicines to help you sleep.  Counseling or therapy.  Lifestyle adjustments to help you sleep better. Follow these instructions at home: Eating and drinking   Limit or avoid alcohol, caffeinated beverages, and cigarettes, especially close to bedtime. These can disrupt your sleep.  Do not eat a large meal or eat spicy foods right before bedtime. This can lead to digestive discomfort that can make it hard for you to sleep. Sleep habits   Keep a sleep diary to help you and your health care provider figure out what could be causing your insomnia. Write down: ? When you sleep. ? When you wake up during the night. ? How well you sleep. ? How rested you feel the next day. ? Any side effects of medicines you  are taking. ? What you eat and drink.  Make your bedroom a dark, comfortable place where it is easy to fall asleep. ? Put up shades or blackout curtains to block light from outside. ? Use a white noise machine to block noise. ? Keep the temperature cool.  Limit screen use before bedtime. This includes: ? Watching TV. ? Using your smartphone, tablet, or computer.  Stick to a routine that includes going to bed and waking up at the same times every day and night. This can help you fall asleep faster. Consider making a quiet activity, such as reading, part of your nighttime routine.  Try to avoid taking naps during the day so that you sleep better at night.  Get out of bed if you are still awake after 15 minutes of trying to sleep. Keep the lights down, but try reading or doing a quiet activity. When you feel sleepy, go back to bed. General instructions  Take over-the-counter and prescription medicines only as told by your health care provider.  Exercise regularly, as told by your health care provider. Avoid exercise starting several hours before bedtime.  Use relaxation techniques to manage stress. Ask your health care provider  to suggest some techniques that may work well for you. These may include: ? Breathing exercises. ? Routines to release muscle tension. ? Visualizing peaceful scenes.  Make sure that you drive carefully. Avoid driving if you feel very sleepy.  Keep all follow-up visits as told by your health care provider. This is important. Contact a health care provider if:  You are tired throughout the day.  You have trouble in your daily routine due to sleepiness.  You continue to have sleep problems, or your sleep problems get worse. Get help right away if:  You have serious thoughts about hurting yourself or someone else. If you ever feel like you may hurt yourself or others, or have thoughts about taking your own life, get help right away. You can go to your nearest  emergency department or call:  Your local emergency services (911 in the [place]  A suicide crisis helpline, such as the [facility] at (256) 296-1657. This is open 24 hours a day. Summary  Insomnia is a sleep disorder that makes it difficult to fall asleep or stay asleep.  Insomnia can be long-term (chronic) or short-term (acute).  Treatment for insomnia depends on the cause. Treatment may focus on treating an underlying condition that is causing insomnia.  Keep a sleep diary to help you and your health care provider figure out what could be causing your insomnia. This information is not intended to replace advice given to you by your health care provider. Make sure you discuss any questions you have with your health care provider. Document Revised: 09/30/2017 Document Reviewed: 07/28/2017 Elsevier Patient Education  2020 [employer].

## 2020-03-20 NOTE — Progress Notes (Signed)
PATIENT: Debbie Wells DOB: Oct 26, 1941  REASON FOR VISIT: follow up HISTORY FROM: patient  HISTORY OF PRESENT ILLNESS: Today 03/20/20:   Rv with [name],MD , 03-20-2020. 79 year old female Patient, married to a pharmacist, and appears today very depressed, lost her long time friend to ESRD and has "questions about myopathy (?)".  Her husband  Is developing dementia, she fears. he suffered a burn on his foot, through frying oil.   She is not " truly"in pain, not any fibromyalgia in the upper body- It's numbness in feet and legs, denies RLS- but then reports burning, pin and needle dyseasthesias. 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 And yes, she has to mash her legs, but she feels she doesn't have to move- massaging the legs and feet doesn't change - it doesn't feel better.  Sleep is affected, and in the morning she feels exhausted. She has not tried a weighted blanket. She has not tried Requip- will order a tiny dose.      [name] is a 79 year old female with a history of neuropathy and sleep disturbance. 79 y.o. year old female  And has a past medical history of Adenomatous colon polyp, Adenomatous polyp of colon (12/03/2004), Anxiety, Chronic headaches, Depression, Diverticulosis, External hemorrhoids, Hyperlipidemia, Hypertension, Hypothyroidism, and Umbilical hernia. here with:  She returns today for follow-up.  [name] has questions concerning her EMG and diagnosis of neuropathy and ongoing discomfort.  I reviewed her nerve conduction study and EMG with her.  She has been placed on gabapentin by her PCP.  She states that taking 100 mg at bedtime has offered her some benefit but has not resolved her discomfort.  She states that she did try taking it during the day for a couple days but it caused drowsiness.  The patient states that she has been under a lot of stress with the pandemic and also losing a best friend.  She states that she is struggling with depression.  She is  currently on Zoloft 25 mg at bedtime.  She also notes that since she started gabapentin she has had urinary frequency.  She is not sure if this is a side effect or coincidence.  She denies any burning or tingling with urination.  She reports some changes with her gait and balance.  She states that she feels unsteady but fortunately has not had any falls.  She returns today for an evaluation.   HISTORY 04-30-2019 interval visit with this 79 year old caucasian, right handed female patient who underwent EMG and NCV with dr Jannifer Franklin here at [facility]. She had a significant improvement in sleep since November 2019 , but some symptoms of neuropathy had been persistently present. The NCV and EMG did not indicate any NP or radiculopathy. She reports being more anxious, attributed this to the Coronavirus pandemic. She wakes up early and can't go back to sleep.  seroquel did not work well, felt groggy and hypervigilant- the opposite that is was intended to do.  She has no indication that her thyroid function is impaired or imbalanced other than she believes she has hair loss, she showed me that fingernails have changed, these are ridged. .  Dr. Forde Dandy confirmed normal labs- free T4 and TSH, and a slight anemia.-  in a virtual visit on May 28th .  I am not sure what else I can do fer her- we could use a short acting sleep aid for early morning arousals, start biotin and treat anxiety with CBT, plus:  have her endocrinologist follow her thyroid function.   REVIEW OF SYSTEMS: Out of a complete 14 system review of symptoms, the patient complains only of the following symptoms, and all other reviewed systems are negative.   Depression-  Highly anxious, very worried, very diminutive.   See HPI ALLERGIES: Allergies  Allergen Reactions  . Iodinated Diagnostic Agents Hives    Per patient she has developed hives and itching in upper trunk of body after last 2 CT scans once she got homes that she has just treated at home with  benadryl. She has not told anyone until now. States she was able to bear it   . Benazepril Cough  . Hydrocodone-Acetaminophen     Vicodin=REACTION: rash  . Sulfonamide Derivatives     REACTION: rash  . Hydrocodone-Acetaminophen Rash    HOME MEDICATIONS: Outpatient Medications Prior to Visit  Medication Sig Dispense Refill  . ALPRAZolam (XANAX) 0.5 MG tablet Take 0.25-0.5 mg by mouth 2 (two) times daily. Pt takes a full tab at night and whole a tab during day    . amLODipine (NORVASC) 5 MG tablet Take 5 mg by mouth daily.    Marland Kitchen aspirin 81 MG tablet Take 81 mg by mouth daily.     . bacitracin ophthalmic ointment Place 1 application into both eyes as needed. Dry eyes  0  . estrogens, conjugated, (PREMARIN) 0.45 MG tablet Take 0.45 mg by mouth daily.     Marland Kitchen gabapentin (NEURONTIN) 100 MG capsule TAKE 1 CAPSULE AT NIGHT TO SEE IF HELPS WITH NEUROPATHIC TYPE PAIN.    Marland Kitchen losartan (COZAAR) 50 MG tablet Take 2 tablets (100 mg total) by mouth daily. Keep upcoming appointment for refills 60 tablet 5  . magnesium hydroxide (MILK OF MAGNESIA) 400 MG/5ML suspension Take by mouth daily as needed for moderate constipation.     . metoprolol succinate (TOPROL-XL) 25 MG 24 hr tablet TAKE 1/2 TABLET BY MOUTH EVERY DAY 45 tablet 3  . Multiple Vitamin (MULTIVITAMIN) capsule Take 1 capsule by mouth daily.    . Omega-3 Fatty Acids (FISH OIL PO) Take 1 tablet by mouth daily.    . polyethylene glycol (MIRALAX / GLYCOLAX) packet Take 17 g by mouth daily as needed.    Marland Kitchen SYNTHROID 75 MCG tablet Take 75 mcg by mouth daily.   1  . traZODone (DESYREL) 100 MG tablet Take 100 mg by mouth at bedtime.   0  . Wheat Dextrin (BENEFIBER DRINK MIX PO) Take by mouth as needed. Two teaspoons    . zaleplon (SONATA) 5 MG capsule Take 5 mg by mouth at bedtime as needed for sleep.    Marland Kitchen amLODipine (NORVASC) 5 MG tablet Take 1 tablet (5 mg total) by mouth 2 (two) times daily. (Patient taking differently: Take 5 mg by mouth daily. ) 60  tablet 2  . LOTEMAX 0.5 % ophthalmic suspension Place 1 drop into both eyes daily. As needed. For dry eyes     No facility-administered medications prior to visit.    PAST MEDICAL HISTORY: Past Medical History:  Diagnosis Date  . Adenomatous colon polyp   . Adenomatous polyp of colon 12/03/2004   57mm  . Anxiety   . Chronic headaches   . Depression   . Diverticulosis   . External hemorrhoids   . Hyperlipidemia   . Hypertension   . Hypothyroidism   . Umbilical hernia     PAST SURGICAL HISTORY: Past Surgical History:  Procedure Laterality Date  . ABDOMINAL HYSTERECTOMY  1975  . APPENDECTOMY  1975  . COLONOSCOPY  03/03/2010,12/03/2004  . ESOPHAGOGASTRODUODENOSCOPY  12/16/11   Dr. Silvano Rusk  . OOPHORECTOMY    . spinal lipoma removed  2006  . TOTAL ABDOMINAL HYSTERECTOMY      FAMILY HISTORY: Family History  Problem Relation Age of Onset  . Esophageal cancer Father   . Stomach cancer Father        mets from esophagus  . Heart disease Mother   . Irritable bowel syndrome Mother   . Kidney disease Mother   . Other Mother        cushings disease  . Addison's disease Mother   . Heart failure Mother   . Stroke Mother   . Hypertension Mother   . Stroke Maternal Grandmother   . Hypertension Maternal Grandmother   . Colon cancer Neg Hx   . Heart attack Neg Hx     SOCIAL HISTORY: Social History   Socioeconomic History  . Marital status: Married    Spouse name: Not on file  . Number of children: 2  . Years of education: Not on file  . Highest education level: Not on file  Occupational History  . Occupation: retired  Tobacco Use  . Smoking status: Former Smoker    Types: Cigarettes    Quit date: 01/23/1993    Years since quitting: 27.1  . Smokeless tobacco: Never Used  Substance and Sexual Activity  . Alcohol use: Yes    Alcohol/week: 2.0 standard drinks    Types: 2 Glasses of wine per week  . Drug use: No  . Sexual activity: Not on file  Other Topics Concern   . Not on file  Social History Narrative   Patient is married she is retired and has 2 children   About 2 alcoholic beverages a week no drugs, no current tobacco she is a former smoker      Lives at home with her husband   Right handed   Social Determinants of Health   Financial Resource Strain:   . Difficulty of Paying Living Expenses:   Food Insecurity:   . Worried About Charity fundraiser in the Last Year:   . Arboriculturist in the Last Year:   Transportation Needs:   . Film/video editor (Medical):   Marland Kitchen Lack of Transportation (Non-Medical):   Physical Activity:   . Days of Exercise per Week:   . Minutes of Exercise per Session:   Stress:   . Feeling of Stress :   Social Connections:   . Frequency of Communication with Friends and Family:   . Frequency of Social Gatherings with Friends and Family:   . Attends Religious Services:   . Active Member of Clubs or Organizations:   . Attends Archivist Meetings:   Marland Kitchen Marital Status:   Intimate Partner Violence:   . Fear of Current or Ex-Partner:   . Emotionally Abused:   Marland Kitchen Physically Abused:   . Sexually Abused:       PHYSICAL EXAM  Vitals:   03/20/20 0822  BP: 130/66  Pulse: 61  SpO2: 97%  Weight: 151 lb (68.5 kg)  Height: 5' 4.5" (1.638 m)   Body mass index is 25.52 kg/m.  Physical exam:  General: The patient is awake, alert and appears worried, anxious, depressed. The patient is well groomed. She smiles, but is not relaxed.  Head: Normocephalic, atraumatic. Neck is supple. Mallampati 2,  neck circumference:13.75. Nasal airflow patent ,  Cardiovascular:  Regular rate and rhythm without  murmurs or carotid bruit, and without distended neck veins. Respiratory: Lungs are clear to auscultation. Skin:  Without evidence of edema, or rash Trunk: BMI is low - 25.52 kg/m2.  The patient's posture is erect, her body language is that of a very anxious and worried individual.   Neurologic exam : The  patient is awake and alert, oriented to place and time.   Memory subjective described as intact.   Attention span & concentration ability appears normal.  Speech is fluent,  without dysarthria, dysphonia -not Having aphasia.  Mood and affect are less anxious. Cranial nerves: Pupils are equal and briskly reactive to light. Facial sensation intact to fine touch. Facial motor strength is symmetric and tongue and uvula move midline. Shoulder shrug was symmetrical.  She has a mild titubation, a mild twitching under left eye and at the left mouth- and in both hands. Low amplitude- the tremor is not a handicap for her. .   Motor exam: normal muscle tone.   All flexion- , extension-, adduction- and abduction is 4 - normal, good grip ! Normal foot lift.  muscle bulk and symmetric strength in all extremities. Sensory:  Fine touch and vibration were felt in both ankles but diminshed in comparison to 2 years ago  she feels subjectively numb. She reports burning. Finger-to-nose maneuver normal without evidence of ataxia, dysmetria but mild tremor. Gait and station: Patient walks without assistive device and turns with 4 Steps.  Romberg testing is  negative. Deep tendon reflexes: in the upper and lower extremities are symmetric and intact.   DIAGNOSTIC DATA (LABS, IMAGING, TESTING):  - I reviewed patient records, labs, notes, testing and imaging myself where available.  Lab Results  Component Value Date   WBC 6.7 11/14/2012   HGB 13.9 11/14/2012   HCT 41.5 11/14/2012   MCV 90.2 11/14/2012   PLT 229 11/14/2012      Component Value Date/Time   NA 142 12/28/2018 1515   K 4.4 12/28/2018 1515   CL 102 12/28/2018 1515   CO2 23 12/28/2018 1515   GLUCOSE 89 12/28/2018 1515   GLUCOSE 89 08/28/2013 1111   BUN 10 12/28/2018 1515   CREATININE 0.87 12/28/2018 1515   CALCIUM 9.9 12/28/2018 1515   PROT 7.0 12/28/2018 1515   ALBUMIN 4.8 (H) 12/28/2018 1515   AST 27 12/28/2018 1515   ALT 19 12/28/2018  1515   ALKPHOS 97 12/28/2018 1515   BILITOT 0.3 12/28/2018 1515   GFRNONAA 65 12/28/2018 1515   GFRAA 75 12/28/2018 1515   No results found for: CHOL, HDL, LDLCALC, LDLDIRECT, TRIG, CHOLHDL No results found for: HGBA1C No results found for: VITAMINB12 Lab Results  Component Value Date   TSH 1.710 04/30/2019    Patient reported having had a 'LIFELINE" screening. I can't comment on this.  Mrs. Costabile had labs obtained through the [employer] labs on Dr. Forde Dandy orders these are dated January 07, 2020.  Fasting glucose was 97 mg/dL, BUN 13 mg/dL creatinine 1.0 mg/dL.  Glomerular filtration rate estimated 53.8, TSH third generation 2.15 Free T4 hormone 1.3 ng both normal vitamin D 48.7 this is in the mid range for vitamin D.  Normal white blood cells normal red blood cells hematocrit hemoglobin, MCV.  Total protein 7.6 albumin 4.3 all normal limits AST and ALT in normal limits 24 and 20 international units/L direct bilirubin 0.2 mg/dL.  Iron binding capacity 322 midrange, iron saturation 23% good, iron 74 mcg/dL  also in normal range pressure was perfectly fine from creatinine to clearance and albumin.     Assessment:  After physical and neurologic examination, review of laboratory studies,  Personal review of imaging studies, reports of  neurophysiology testing and pre-existing records as far as provided in visit., my assessment is   1.  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  2.  Depression and Sleep disturbance 3.  Urinary Frequency  The patient will continue on gabapentin, but we keep the lowest dose allowing for better sleep.   I advised that she could try taking 1 tablet in the morning  PRN dose) and always 1 tablet gabapentin in the evening.  She could not tolerate Zoloft 50 mg at bedtime, barely felt any effect on Zoloft 25 mg.  She is on trazodone for sleep.  We had discussed the potential for serotonin syndrome with Zoloft and  trazodone.  I went over the signs and symptoms of this.   I like for her to see Urologist for weak urinary stream.     Dr. Brett Fairy 03/20/2020, 8:42 AM [facility] [address] [place], [place] [address] [phone]

## 2020-03-21 ENCOUNTER — Telehealth: Payer: Self-pay | Admitting: Neurology

## 2020-03-21 DIAGNOSIS — F418 Other specified anxiety disorders: Secondary | ICD-10-CM

## 2020-03-21 DIAGNOSIS — E079 Disorder of thyroid, unspecified: Secondary | ICD-10-CM

## 2020-03-21 NOTE — Telephone Encounter (Signed)
I will order a CYSTATIN C level for renal function evaluation- that test is not influenced by muscle mass. I spoke to the patient and she will come by in the next 14 days to have it done.

## 2020-03-24 NOTE — Telephone Encounter (Signed)
Noted thank you

## 2020-03-26 ENCOUNTER — Other Ambulatory Visit (INDEPENDENT_AMBULATORY_CARE_PROVIDER_SITE_OTHER): Payer: Self-pay

## 2020-03-26 ENCOUNTER — Other Ambulatory Visit: Payer: Self-pay

## 2020-03-26 ENCOUNTER — Other Ambulatory Visit: Payer: Self-pay | Admitting: Cardiology

## 2020-03-26 DIAGNOSIS — F418 Other specified anxiety disorders: Secondary | ICD-10-CM

## 2020-03-26 DIAGNOSIS — Z0289 Encounter for other administrative examinations: Secondary | ICD-10-CM

## 2020-03-26 DIAGNOSIS — G63 Polyneuropathy in diseases classified elsewhere: Secondary | ICD-10-CM

## 2020-03-27 ENCOUNTER — Encounter: Payer: Self-pay | Admitting: Neurology

## 2020-03-27 LAB — CYSTATIN C: CYSTATIN C: 0.79 mg/L (ref 0.62–1.16)

## 2020-03-27 NOTE — Progress Notes (Signed)
This is an excellent, normal level of Cystatin C and there is no indication of progressive renal impairment !  Your renal function is fine. You can have imaging studies with contrast when indicated, and medication dosing does not need to be adjusted as in CKD.

## 2020-04-18 ENCOUNTER — Telehealth: Payer: Self-pay | Admitting: Cardiology

## 2020-04-18 NOTE — Telephone Encounter (Signed)
New Message  Pt c/o medication issue:  1. Name of Medication:  losartan (COZAAR) 50 MG tablet  metoprolol succinate (TOPROL-XL) 25 MG 24 hr tablet   amLODipine (NORVASC) 5 MG tablet  2. How are you currently taking this medication (dosage and times per day)? As directed  3. Are you having a reaction (difficulty breathing--STAT)? Pain in legs and ankles, trouble walking,   4. What is your medication issue? Patient believes that she is having a reaction to the medications. Having pain in her legs and ankles and trouble walking. Please give patient a call back to advise.

## 2020-04-18 NOTE — Telephone Encounter (Signed)
Spoke with patient. Patient complains of leg pain and difficulty walking. She says since she started Norvasc in addition to her Losartan she noticed the pain.   She has seen her PCP and a neurologist. The neurologist prescribed gabapentin but patient states it keeps her up urinating and causes right sided chest pain until the excessive urination stops. She has talked to the neurologist about the side effects but no changes have been made.   She states she asked Dr. Stanford Breed before if her BP meds could be causing the leg pain and he said no. She wants this re-evaluated. She also has an upcoming office visit on 04/23/20.  Will route to MD for review.

## 2020-04-19 NOTE — Telephone Encounter (Signed)
Will review meds at [facility]

## 2020-04-21 NOTE — Telephone Encounter (Signed)
Spoke with pt, Aware of dr crenshaw's recommendations.  °

## 2020-04-22 NOTE — Progress Notes (Signed)
Cardiology Clinic Note   Patient Name: Debbie Wells Date of Encounter: 04/23/2020  Primary Care Provider:  [name], MD Primary Cardiologist:  Debbie Ruths, MD  Patient Profile    Debbie Wells 79 year old female presents today for a review of her medications (Norvasc) and complaints of lower extremity pain.  Past Medical History    Past Medical History:  Diagnosis Date  . Adenomatous colon polyp   . Adenomatous polyp of colon 12/03/2004   38mm  . Anxiety   . Chronic headaches   . Depression   . Diverticulosis   . External hemorrhoids   . Hyperlipidemia   . Hypertension   . Hypothyroidism   . Umbilical hernia    Past Surgical History:  Procedure Laterality Date  . ABDOMINAL HYSTERECTOMY  1975  . APPENDECTOMY  1975  . COLONOSCOPY  03/03/2010,12/03/2004  . ESOPHAGOGASTRODUODENOSCOPY  12/16/11   Debbie Wells  . OOPHORECTOMY    . spinal lipoma removed  2006  . TOTAL ABDOMINAL HYSTERECTOMY      Allergies  Allergies  Allergen Reactions  . Iodinated Diagnostic Agents Hives    Per patient she has developed hives and itching in upper trunk of body after last 2 CT scans once she got homes that she has just treated at home with benadryl. She has not told anyone until now. States she was able to bear it   . Benazepril Cough  . Hydrocodone-Acetaminophen     Vicodin=REACTION: rash  . Sulfonamide Derivatives     REACTION: rash  . Hydrocodone-Acetaminophen Rash    History of Present Illness    Debbie Wells has a PMH of essential hypertension, IBS, chronic constipation, hypercholesterolemia, anxiety about health, and chronic insomnia.  She was last seen by Debbie Wells on 01/28/2020 for a follow-up evaluation of her hypertension.  She has worn an outpatient 24-hour blood pressure monitor which indicated wide swings in her blood pressure.  She also had a 24-hour urine for metanephrines which was normal.  She was also ruled out for Cushing's syndrome by her PCP.  A  Lexiscan Myoview 12/07/2012 showed no ischemia and an EF of 60% with no wall motion abnormalities.  An echocardiogram 11/12 showed normal LV function and mild mitral regurgitation.  Renal Dopplers 2014 showed mild 1-59% right RAS.  She has a history of chronic epigastric pain in her left upper quadrant and has been seen by GI.  She was seen by Debbie Wells for evaluation of mesenteric or celiac artery stenosis as a possible cause of left upper quadrant pain.  Debbie Wells did not feel mesenteric ischemia was not the cause of her pain.  She has also been seen by vascular surgery at [facility].  They did not feel she would benefit from surgery.  She has a previous cough with ACE inhibitor's.  She is also noted to have bradycardia with higher doses of beta-blocker.  She previously had [facility] screening which showed normal carotids, no aneurysm, and normal ABIs.  During her last clinic visit she denied dyspnea, chest pain, palpitations, and syncope.  She had been diagnosed with stage III CKD prior to her last clinic visit and was concerned with this news.  She presents to the clinic today for follow-up evaluation and states for the last 4 months she has noticed numbness burning and tingling in her bilateral lower extremities.  She states that her left is greater than her right.  She has been to see neurology who did  nerve conduction study and prescribed gabapentin.  She states that she had some relief with gabapentin however, this also acted as a diuretic.  She states that she wanted to reassure herself that her symptoms were not being caused by her blood pressure medication or cardiac related issues.  I have instructed her to follow-up with neurology to review gabapentin and talk about other medication options.  I will have her follow-up in 6 months with Debbie Wells.  Today she denies chest pain, shortness of breath, lower extremity edema, fatigue, palpitations, melena, hematuria, hemoptysis,  diaphoresis, weakness, presyncope, syncope, orthopnea, and PND.   Home Medications    Prior to Admission medications   Medication Sig Start Date End Date Taking? Authorizing Provider  ALPRAZolam Duanne Moron) 0.5 MG tablet Take 0.25-0.5 mg by mouth 2 (two) times daily. Pt takes a full tab at night and whole a tab during day    [provider]  amLODipine (NORVASC) 5 MG tablet Take 5 mg by mouth daily.    [provider]  aspirin 81 MG tablet Take 81 mg by mouth daily.     [provider]  bacitracin ophthalmic ointment Place 1 application into both eyes as needed. Dry eyes 10/01/15   [provider]  estrogens, conjugated, (PREMARIN) 0.45 MG tablet Take 0.45 mg by mouth daily.     [provider]  gabapentin (NEURONTIN) 100 MG capsule TAKE 1 CAPSULE AT NIGHT TO SEE IF HELPS WITH NEUROPATHIC TYPE PAIN. 07/07/19   [provider]  losartan (COZAAR) 50 MG tablet TAKE 2 TABLETS (100 MG TOTAL) BY MOUTH DAILY. KEEP UPCOMING APPOINTMENT FOR REFILLS 03/26/20   Debbie Perla, MD  magnesium hydroxide (MILK OF MAGNESIA) 400 MG/5ML suspension Take by mouth daily as needed for moderate constipation.     [provider]  metoprolol succinate (TOPROL-XL) 25 MG 24 hr tablet TAKE 1/2 TABLET BY MOUTH EVERY DAY 11/21/19   Debbie Perla, MD  Multiple Vitamin (MULTIVITAMIN) capsule Take 1 capsule by mouth daily.    [provider]  Omega-3 Fatty Acids (FISH OIL PO) Take 1 tablet by mouth daily.    [provider]  polyethylene glycol (MIRALAX / GLYCOLAX) packet Take 17 g by mouth daily as needed.    [provider]  SYNTHROID 75 MCG tablet Take 75 mcg by mouth daily.  03/15/18   [provider]  traZODone (DESYREL) 100 MG tablet Take 100 mg by mouth at bedtime.  11/12/14   [provider]  Wheat Dextrin (BENEFIBER DRINK MIX PO) Take by mouth as needed. Two teaspoons    [provider]  zaleplon (SONATA)  5 MG capsule Take 5 mg by mouth at bedtime as needed for sleep.    [provider]    Family History    Family History  Problem Relation Age of Onset  . Esophageal cancer Father   . Stomach cancer Father        mets from esophagus  . Heart disease Mother   . Irritable bowel syndrome Mother   . Kidney disease Mother   . Other Mother        cushings disease  . Addison's disease Mother   . Heart failure Mother   . Stroke Mother   . Hypertension Mother   . Stroke Maternal Grandmother   . Hypertension Maternal Grandmother   . Colon cancer Neg Hx   . Heart attack Neg Hx    She indicated that her mother is deceased.  She indicated that her father is deceased. She indicated that her maternal grandmother is deceased. She indicated that her maternal grandfather is deceased. She indicated that her paternal grandmother is deceased. She indicated that her paternal grandfather is deceased. She indicated that the status of her neg hx is unknown.  Social History    Social History   Socioeconomic History  . Marital status: Married    Spouse name: Not on file  . Number of children: 2  . Years of education: Not on file  . Highest education level: Not on file  Occupational History  . Occupation: retired  Tobacco Use  . Smoking status: Former Smoker    Types: Cigarettes    Quit date: 01/23/1993    Years since quitting: 27.2  . Smokeless tobacco: Never Used  Vaping Use  . Vaping Use: Never used  Substance and Sexual Activity  . Alcohol use: Yes    Alcohol/week: 2.0 standard drinks    Types: 2 Glasses of wine per week  . Drug use: No  . Sexual activity: Not on file  Other Topics Concern  . Not on file  Social History Narrative   Patient is married she is retired and has 2 children   About 2 alcoholic beverages a week no drugs, no current tobacco she is a former smoker      Lives at home with her husband   Right handed   Social Determinants of Health   Financial Resource  Strain:   . Difficulty of Paying Living Expenses:   Food Insecurity:   . Worried About Charity fundraiser in the Last Year:   . Arboriculturist in the Last Year:   Transportation Needs:   . Film/video editor (Medical):   Marland Kitchen Lack of Transportation (Non-Medical):   Physical Activity:   . Days of Exercise per Week:   . Minutes of Exercise per Session:   Stress:   . Feeling of Stress :   Social Connections:   . Frequency of Communication with Friends and Family:   . Frequency of Social Gatherings with Friends and Family:   . Attends Religious Services:   . Active Member of Clubs or Organizations:   . Attends Archivist Meetings:   Marland Kitchen Marital Status:   Intimate Partner Violence:   . Fear of Current or Ex-Partner:   . Emotionally Abused:   Marland Kitchen Physically Abused:   . Sexually Abused:      Review of Systems    General:  No chills, fever, night sweats or weight changes.  Cardiovascular:  No chest pain, dyspnea on exertion, edema, orthopnea, palpitations, paroxysmal nocturnal dyspnea. Dermatological: No rash, lesions/masses Respiratory: No cough, dyspnea Urologic: No hematuria, dysuria Abdominal:   No nausea, vomiting, diarrhea, bright red blood per rectum, melena, or hematemesis Neurologic:  No visual changes, wkns, changes in mental status. All other systems reviewed and are otherwise negative except as noted above.  Physical Exam    VS:  BP 128/80   Pulse (!) 59   Temp (!) 97 F (36.1 C)   Ht 5\' 4"  (1.626 m)   Wt 140 lb (63.5 kg)   SpO2 96%   BMI 24.03 kg/m  , BMI Body mass index is 24.03 kg/m. GEN: Well nourished, well developed, in no acute distress. HEENT: normal. Neck: Supple, no JVD, carotid bruits, or masses. Cardiac: RRR, no murmurs, rubs, or gallops. No clubbing, cyanosis, edema.  Radials/DP/PT 2+ and equal bilaterally.  Respiratory:  Respirations  regular and unlabored, clear to auscultation bilaterally. GI: Soft, nontender, nondistended, BS + x  4. MS: no deformity or atrophy. Skin: warm and dry, no rash. Neuro:  Strength and sensation are intact.  Lower extremity burning/numbness Psych: Normal affect.  Accessory Clinical Findings    ECG personally reviewed by me today-none today.  EKG 07/02/2019 Sinus bradycardia 59 bpm  Echocardiogram 01/10/2013 Study Conclusions   - Left ventricle: The cavity size was normal. Wall thickness  was normal. Systolic function was normal. The estimated  ejection fraction was in the range of 60% to 65%. Wall  motion was normal; there were no regional wall motion  abnormalities. Doppler parameters are consistent with  abnormal left ventricular relaxation (grade 1 diastolic  dysfunction).  - Atrial septum: No defect or patent foramen ovale was  identified.  A Lexiscan Myoview 12/07/2012 showed no ischemia and an EF of 60% with no wall motion abnormalities.   Assessment & Plan   1.  Lower extremity pain-as noted pain for 4 months.  Felt the pain may be related to her blood pressure medication regimen.  No lower extremity edema.  Has been evaluated by neurology and gabapentin was prescribed.  Gabapentin provided some relief but also increased urination at night. Consult neurology in follow-up. Reassured it was not related to cardiac causes.  Essential hypertension-BP today 128/80.  Well-controlled at home. Continue amlodipine, losartan, metoprolol Heart healthy low-sodium diet-salty 6 given Increase physical activity as tolerated  Palpitations-heart rate today 59.  No recent episodes of accelerated heart rate or skipped beats.  Noted to have bradycardia with higher doses of beta-blocker. Continue metoprolol Heart healthy low-sodium diet-salty 6 given Avoid triggers caffeine, chocolate, EtOH etc.   Hyperlipidemia-LDL 89 on 03/07/2019 Heart healthy low-sodium high-fiber diet Increase physical activity as tolerated Followed by PCP  Chronic abdominal pain-has been thoroughly  evaluated and is followed by GI.  Disposition: Follow-up with Debbie Wells or me in 6 months.  Jossie Ng. Gertrude Bucks NP-C    04/23/2020, 3:53 PM [address] Office [phone] Fax [phone]

## 2020-04-23 ENCOUNTER — Ambulatory Visit: Payer: Medicare PPO | Admitting: General Practice

## 2020-04-23 ENCOUNTER — Other Ambulatory Visit: Payer: Self-pay

## 2020-04-23 ENCOUNTER — Encounter: Payer: Self-pay | Admitting: General Practice

## 2020-04-23 VITALS — BP 128/80 | HR 59 | Temp 97.0°F | Ht 64.0 in | Wt 140.0 lb

## 2020-04-23 DIAGNOSIS — E785 Hyperlipidemia, unspecified: Secondary | ICD-10-CM

## 2020-04-23 DIAGNOSIS — R109 Unspecified abdominal pain: Secondary | ICD-10-CM

## 2020-04-23 DIAGNOSIS — M79605 Pain in left leg: Secondary | ICD-10-CM

## 2020-04-23 DIAGNOSIS — R002 Palpitations: Secondary | ICD-10-CM | POA: Diagnosis not present

## 2020-04-23 DIAGNOSIS — G8929 Other chronic pain: Secondary | ICD-10-CM

## 2020-04-23 DIAGNOSIS — I10 Essential (primary) hypertension: Secondary | ICD-10-CM

## 2020-04-23 DIAGNOSIS — M79604 Pain in right leg: Secondary | ICD-10-CM

## 2020-04-23 NOTE — Patient Instructions (Signed)
Medication Instructions:  The current medical regimen is effective;  continue present plan and medications as directed. Please refer to the Current Medication list given to you today. *If you need a refill on your cardiac medications before your next appointment, please call your pharmacy*  Special Instructions MAINTAIN HYDRATION  Follow-Up: Your next appointment:  6 month(s) In Person with Kirk Ruths, MD  At [facility], you and your health needs are our priority.  As part of our continuing mission to provide you with exceptional heart care, we have created designated Provider Care Teams.  These Care Teams include your primary Cardiologist (physician) and Advanced Practice Providers (APPs -  Physician Assistants and Nurse Practitioners) who all work together to provide you with the care you need, when you need it.

## 2020-04-29 ENCOUNTER — Other Ambulatory Visit: Payer: Self-pay | Admitting: Cardiology

## 2020-05-06 DIAGNOSIS — G609 Hereditary and idiopathic neuropathy, unspecified: Secondary | ICD-10-CM | POA: Insufficient documentation

## 2020-05-30 ENCOUNTER — Other Ambulatory Visit: Payer: Self-pay | Admitting: Urology

## 2020-06-25 ENCOUNTER — Telehealth: Payer: Self-pay | Admitting: Neurology

## 2020-06-25 NOTE — Telephone Encounter (Signed)
Pt ask if  gabapentin (NEURONTIN) 100 MG capsule and Sonata (old medication) had been prescribed by Dr. Brett Fairy 04/30/19 can be taken together. Also having sleeping issues, pain, numbness n feet and legs. Would like nurse to call back.

## 2020-06-26 MED ORDER — GABAPENTIN 100 MG PO CAPS: ORAL_CAPSULE | ORAL | 1 refills | Status: DC

## 2020-06-26 MED ORDER — ZALEPLON 5 MG PO CAPS: 5.0000 mg | ORAL_CAPSULE | Freq: Every evening | ORAL | 1 refills | Status: DC | PRN

## 2020-06-26 NOTE — Telephone Encounter (Signed)
Called the pt back she has been having difficulty with getting sufficient sleep.  She is currently prescribed gabapentin 100 mg at bedtime (not consistently), xanax 0.5 mg at bedtime, trazodone 100 mg at bedtime.  In past Dr Brett Fairy has prescribed sonata 5 mg at bedtime but the patient states that she really didn't take it.  She was desperate for sleep the other night and she did take one that she had when previously prescribed and she was able to sleep through the night. (not taking her gabapentin on this night)  She wanted to verify that Dr Brett Fairy thinks safe since she has the other medications. (she has been on trazodone and xanax for yrs and doesn't want to come off of that) She wanted to verify the gabapentin is safe to also take with the sonata as well as the others. She assumed it is since was prescribed previously and she was on those medications but wanted to clarify. Advised I would discuss further with Dr Brett Fairy and contact her back. She was appreciative.

## 2020-06-26 NOTE — Telephone Encounter (Signed)
Called the patient back to advise I discussed her concerns with Dr. Brett Fairy.  Dr Dohmeier feels that it is ok for the patient to take the Sonata, gabapentin and trazodone. She would advise that patient not take the xanax at bedtime with the sonata. She has been on the xanax for years and is unable to really come off of that medication. I advised she could talk to PCP to see if they are ok with her taking the bedtime dose earlier in the evening. She verbalized understanding. She also advised that the gabapentin she has her bottle says take TID prn. I informed her I only had 1 cap at bedtime on our medication list. When I looked at Dr Brett Fairy most recent note she states patient should always take 1 at bedtime and an additional capsule in morning if needed. I have corrected the script on file to reflect the current recommendation from Dr Brett Fairy and advised the patient. Patient has sonata and gabapentin and does not need a refill at this time.  Patient wanted to advise that her BP has been more elevated at bedtime. She was asking if this could play a role in her restlessness. Informed her yes if she is having pain and anxiety that could cause increase in bp. Advised that with increase BP we also see patient with sleep apnea elevated BP. Dr Dohmeier had advised previously states that she was not a candidate for a sleep study. Advised that if after starting this medication regimin she finds that this is still an issue we can address completing a sleep study. Pt verbalized understanding.

## 2020-06-26 NOTE — Addendum Note (Signed)
Addended by: Darleen Crocker on: 06/26/2020 12:09 PM   Modules accepted: Orders

## 2020-06-30 ENCOUNTER — Telehealth: Payer: Self-pay | Admitting: Cardiology

## 2020-06-30 NOTE — Telephone Encounter (Signed)
Returned the call to the patient. She stated that she has had insomnia for years and the last few months this has been getting worse. She wants to know if the Losartan can cause insomnia. She previously took the Losartan all in the morning and then was advised to take it 50 mg bid.   She is also on Metoprolol 12.5 mg daily (at bedtime) and Amlodipine 5 mg in the morning. She does not take the amlodipine 5 mg bid and stated she was not aware that was how it was prescribed.  Yesterday: morning blood pressure after medications 126/70 HR 64 Evening blood pressure was 138/68 and HR was 68  She stated that she is also seeing a neurologist for the insomnia but wanted to see if Dr. Stanford Breed had any advice such as taking the Losartan 100 mg all in the morning instead of twice daily. She has been advised that if the insomnia is now getting worse then it is not likely to be the cardiac medications.

## 2020-06-30 NOTE — Telephone Encounter (Signed)
Pt c/o medication issue:  1. Name of Medication: losartan (COZAAR) 50 MG tablet  metoprolol succinate (TOPROL-XL) 25 MG 24 hr tablet    2. How are you currently taking this medication (dosage and times per day)? As directed   3. Are you having a reaction (difficulty breathing--STAT)? No   4. What is your medication issue?   Patient is requesting to speak with Dr. Jacalyn Lefevre nurse regarding medication. She states she has had trouble sleeping and she assumes this medication may be keeping her up during the night.

## 2020-06-30 NOTE — Telephone Encounter (Signed)
Spoke with pt, Aware of dr crenshaw's recommendations.  °

## 2020-06-30 NOTE — Telephone Encounter (Signed)
I do not think losartan is causing insomnia.  Taking 100 mg in the morning would be fine versus twice daily. Kirk Ruths

## 2020-07-24 ENCOUNTER — Telehealth: Payer: Self-pay | Admitting: Cardiology

## 2020-07-24 NOTE — Telephone Encounter (Signed)
Pt c/o medication issue:  1. Name of Medication:  losartan (COZAAR) 50 MG tablet 2x daily (100 mg total)  gabapentin (NEURONTIN) 100 MG capsule once daily, up to 2x if needed   2. How are you currently taking this medication (dosage and times per day)?   3. Are you having a reaction (difficulty breathing--STAT)? Occasional increased urination   4. What is your medication issue? Patient wanted to know if it is safe for her to take these medications at the same time. She has stage 3 chronic kidney disease and is concerned about her kidneys or any other potential interactions

## 2020-07-24 NOTE — Telephone Encounter (Signed)
Called patient back. Patient given recommendations from the pharm D. Patient verbalized understanding.

## 2020-07-24 NOTE — Telephone Encounter (Signed)
Pt's SCr on 05/29/20 was 0.82, eGFR was 69, CrCl is 70m/min.  Her SCr is normal, but eGFR and CrCl are slightly reduced mainly due to age and lower body weight.  There is no issue with her taking either medication - losartan is actually protective in patients with kidney disease, and she takes a low dose of gabapentin that is fine as well. No issue with her taking these medications at the same time.

## 2020-08-12 ENCOUNTER — Telehealth: Payer: Self-pay | Admitting: Neurology

## 2020-08-12 NOTE — Telephone Encounter (Signed)
I returned the call to the patient. Her husband answered the phone and said she had stepped out. I provided our number to call back.   She has a pending appt with Dr. Brett Fairy on 08/19/20.

## 2020-08-12 NOTE — Telephone Encounter (Signed)
Pt called stating that she is needing to speak to the RN about having problems sleeping and that she needed to see the MD asap. I offered pt the soonest appt that the MD had available but pt was not happy that I was the one scheduling it and took the appt but still requested to speak to the RN about the same topic. Please advise.

## 2020-08-19 ENCOUNTER — Encounter: Payer: Self-pay | Admitting: Neurology

## 2020-08-19 ENCOUNTER — Ambulatory Visit: Payer: Medicare PPO | Admitting: Neurology

## 2020-08-19 ENCOUNTER — Telehealth: Payer: Self-pay | Admitting: Internal Medicine

## 2020-08-19 VITALS — BP 131/73 | HR 82 | Ht 64.5 in | Wt 133.0 lb

## 2020-08-19 DIAGNOSIS — G629 Polyneuropathy, unspecified: Secondary | ICD-10-CM

## 2020-08-19 DIAGNOSIS — E079 Disorder of thyroid, unspecified: Secondary | ICD-10-CM

## 2020-08-19 DIAGNOSIS — E0590 Thyrotoxicosis, unspecified without thyrotoxic crisis or storm: Secondary | ICD-10-CM | POA: Diagnosis not present

## 2020-08-19 DIAGNOSIS — F5104 Psychophysiologic insomnia: Secondary | ICD-10-CM

## 2020-08-19 DIAGNOSIS — G63 Polyneuropathy in diseases classified elsewhere: Secondary | ICD-10-CM

## 2020-08-19 DIAGNOSIS — F418 Other specified anxiety disorders: Secondary | ICD-10-CM

## 2020-08-19 DIAGNOSIS — R4589 Other symptoms and signs involving emotional state: Secondary | ICD-10-CM

## 2020-08-19 DIAGNOSIS — R946 Abnormal results of thyroid function studies: Secondary | ICD-10-CM

## 2020-08-19 NOTE — Telephone Encounter (Signed)
Pt is requesting a call back from a nurse to discuss her constipation, loss of appetite, unexplained weight loss, pt would like to see Dr Carlean Purl but no appts available anytime soon.

## 2020-08-19 NOTE — Patient Instructions (Addendum)
Dear Ms. Debbie Wells -  I have shared this information with [name], NP.   At bedtime: gabapentin for neuropathic sensory changes, as well as  trazodone and xanax , she can't sleep without them. She sometimes sleeps only 3-4 hours  , but gabapentin helps to prolong that sleep.  No sonata while taking ambien/ xanax. She is on Xanax for 35 years (!)-ever since endometriosis was treated by a radical hysterectomy. Stayed on HRT to this day.  Anxiety should be addressed by a therapist. She also lost weight , 132 pounds, and reports loss of appetite and severe constipation. Depression ?

## 2020-08-19 NOTE — Progress Notes (Signed)
PATIENT: Debbie Wells DOB: Feb 09, 1941  REASON FOR VISIT: follow up HISTORY FROM: patient; presents today with sleep concerns still remaining an issue. States she is having increasing symptoms with neuropathy in feet and legs.  HISTORY OF PRESENT ILLNESS: Today 08/19/20:  RV on 08-19-2020 , with Debbie Wells, a 79 year old caucasian married female and patient of Dr. [name], MD.  She reports worsening pain and discomfort in feet and sometimes the legs, progression.  She is taking gabapentin twice daily, she wakes up with pain, not from pain.  It's numbness in feet and legs, but she denies the irresistible urge to move ( not  RLS)- but then reports numbness, also burning, pin and needle dyseasthesias. 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 And yes, she has to mash her legs, but she feels she doesn't have to move- massaging the legs and feet doesn't change - it doesn't feel better.  Sleep is affected, and in the morning she feels exhausted.  She is concerned about polypharmacy. We are looking at sleep medication-   At bedtime: gabapentin,  trazodone and xanax , she can't sleep without them. She sometimes sleeps only 3-4 hours  , but gabapentin helps to prolong that sleep.  No sonata while taking ambien/ xanax. She is on Xanax for 35 years (!)-ever since endometriosis was treated by a radical hysterectomy. Stayed on HRT to this day.  Anxiety should be addressed by a therapist. She also lost weight , 132 pounds, and reports loss of appetite and severe constipation. Depression ?       Rv with Asencion Partridge Webber Michiels,MD , 03-20-2020. 79 year old female Patient, married to a pharmacist, and appears today very depressed, lost her long time friend to ESRD and has "questions about myopathy (?)".  Her husband  Is developing dementia, she fears. he suffered a burn on his foot, through frying oil.  She is not " truly"in pain, not any fibromyalgia in the upper body- It's numbness in feet and legs,  denies RLS- but then reports burning, pin and needle dyseasthesias. 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 And yes, she has to mash her legs, but she feels she doesn't have to move- massaging the legs and feet doesn't change - it doesn't feel better.  Sleep is affected, and in the morning she feels exhausted. She has not tried a weighted blanket. She has not tried Requip- will order a tiny dose.      Debbie Wells is a 79 year old female with a history of neuropathy and sleep disturbance. 79 y.o. year old female  And has a past medical history of Adenomatous colon polyp, Adenomatous polyp of colon (12/03/2004), Anxiety, Chronic headaches, Depression, Diverticulosis, External hemorrhoids, Hyperlipidemia, Hypertension, Hypothyroidism, and Umbilical hernia. here with:  She returns today for follow-up.  Debbie Wells has questions concerning her EMG and diagnosis of neuropathy and ongoing discomfort.  I reviewed her nerve conduction study and EMG with her.  She has been placed on gabapentin by her PCP.  She states that taking 100 mg at bedtime has offered her some benefit but has not resolved her discomfort.  She states that she did try taking it during the day for a couple days but it caused drowsiness.  The patient states that she has been under a lot of stress with the pandemic and also losing a best friend.  She states that she is struggling with depression.  She is currently on Zoloft 25 mg at bedtime.  She also notes that since  she started gabapentin she has had urinary frequency.  She is not sure if this is a side effect or coincidence.  She denies any burning or tingling with urination.  She reports some changes with her gait and balance.  She states that she feels unsteady but fortunately has not had any falls.  She returns today for an evaluation.   HISTORY 04-30-2019 interval visit with this 79 year old caucasian, right handed female patient who underwent EMG and NCV with dr Jannifer Franklin here at [facility]. She had a  significant improvement in sleep since November 2019 , but some symptoms of neuropathy had been persistently present. The NCV and EMG did not indicate any NP or radiculopathy. She reports being more anxious, attributed this to the Coronavirus pandemic. She wakes up early and can't go back to sleep.  seroquel did not work well, felt groggy and hypervigilant- the opposite that is was intended to do.  She has no indication that her thyroid function is impaired or imbalanced other than she believes she has hair loss, she showed me that fingernails have changed, these are ridged. .  Dr. Forde Dandy confirmed normal labs- free T4 and TSH, and a slight anemia.-  in a virtual visit on May 28th .  I am not sure what else I can do fer her- we could use a short acting sleep aid for early morning arousals, start biotin and treat anxiety with CBT, plus: have her endocrinologist follow her thyroid function.   REVIEW OF SYSTEMS: Out of a complete 14 system review of symptoms, the patient complains only of the following symptoms, and all other reviewed systems are negative.   Depression-  Highly anxious, very worried, very diminutive.   At bedtime: gabapentin,  trazodone and xanax , she can't sleep without them. She sometimes sleeps only 3-4 hours  , but gabapentin helps to prolong that sleep.  No sonata while taking ambien/ xanax. She is on Xanax for 35 years (!)-ever since endometriosis was treated by a radical hysterectomy. Stayed on HRT to this day.  Anxiety should be addressed by a therapist. She also lost weight , 132 pounds, and reports loss of appetite and severe constipation. Depression ?      See HPI ALLERGIES: Allergies  Allergen Reactions  . Iodinated Diagnostic Agents Hives    Per patient she has developed hives and itching in upper trunk of body after last 2 CT scans once she got homes that she has just treated at home with benadryl. She has not told anyone until now. States she was able to bear it    . Benazepril Cough  . Hydrocodone-Acetaminophen     Vicodin=REACTION: rash  . Sulfonamide Derivatives     REACTION: rash  . Hydrocodone-Acetaminophen Rash    HOME MEDICATIONS: Outpatient Medications Prior to Visit  Medication Sig Dispense Refill  . ALPRAZolam (XANAX) 0.5 MG tablet Take 0.25-0.5 mg by mouth 3 (three) times daily as needed. Pt takes a full tab at night and whole a tab during day    . amLODipine (NORVASC) 5 MG tablet TAKE 1 TABLET BY MOUTH TWICE A DAY (Patient taking differently: Take 5 mg by mouth daily. ) 180 tablet 1  . aspirin 81 MG tablet Take 81 mg by mouth daily.     . bacitracin ophthalmic ointment Place 1 application into both eyes as needed. Dry eyes  0  . cephALEXin (KEFLEX) 250 MG capsule     . estrogens, conjugated, (PREMARIN) 0.45 MG tablet Take 0.45 mg  by mouth daily.     Marland Kitchen gabapentin (NEURONTIN) 100 MG capsule Pt may take 1 capsule in the morning as needed and Always 1 capsule at bedtime 180 capsule 1  . losartan (COZAAR) 50 MG tablet TAKE 2 TABLETS (100 MG TOTAL) BY MOUTH DAILY. KEEP UPCOMING APPOINTMENT FOR REFILLS 180 tablet 2  . magnesium hydroxide (MILK OF MAGNESIA) 400 MG/5ML suspension Take by mouth daily as needed for moderate constipation.     . metoprolol succinate (TOPROL-XL) 25 MG 24 hr tablet TAKE 1/2 TABLET BY MOUTH EVERY DAY 45 tablet 3  . Multiple Vitamin (MULTIVITAMIN) capsule Take 1 capsule by mouth daily.    . Omega-3 Fatty Acids (FISH OIL PO) Take 1 tablet by mouth daily.    . polyethylene glycol (MIRALAX / GLYCOLAX) packet Take 17 g by mouth daily as needed.    Marland Kitchen SYNTHROID 75 MCG tablet Take 75 mcg by mouth daily.   1  . traZODone (DESYREL) 100 MG tablet Take 100-150 mg by mouth at bedtime.   0  . Wheat Dextrin (BENEFIBER DRINK MIX PO) Take by mouth as needed. Two teaspoons    . zolpidem (AMBIEN) 5 MG tablet Take 5 mg by mouth at bedtime as needed for sleep.    . zaleplon (SONATA) 5 MG capsule Take 1 capsule (5 mg total) by mouth at  bedtime as needed for sleep. (Patient not taking: Reported on 08/19/2020) 90 capsule 1   No facility-administered medications prior to visit.    PAST MEDICAL HISTORY: Past Medical History:  Diagnosis Date  . Adenomatous colon polyp   . Adenomatous polyp of colon 12/03/2004   64mm  . Anxiety   . Chronic headaches   . Depression   . Diverticulosis   . External hemorrhoids   . Hyperlipidemia   . Hypertension   . Hypothyroidism   . Umbilical hernia     PAST SURGICAL HISTORY: Past Surgical History:  Procedure Laterality Date  . ABDOMINAL HYSTERECTOMY  1975  . APPENDECTOMY  1975  . COLONOSCOPY  03/03/2010,12/03/2004  . ESOPHAGOGASTRODUODENOSCOPY  12/16/11   Dr. Silvano Rusk  . OOPHORECTOMY    . spinal lipoma removed  2006  . TOTAL ABDOMINAL HYSTERECTOMY      FAMILY HISTORY: Family History  Problem Relation Age of Onset  . Esophageal cancer Father   . Stomach cancer Father        mets from esophagus  . Heart disease Mother   . Irritable bowel syndrome Mother   . Kidney disease Mother   . Other Mother        cushings disease  . Addison's disease Mother   . Heart failure Mother   . Stroke Mother   . Hypertension Mother   . Stroke Maternal Grandmother   . Hypertension Maternal Grandmother   . Colon cancer Neg Hx   . Heart attack Neg Hx     SOCIAL HISTORY: Social History   Socioeconomic History  . Marital status: Married    Spouse name: Not on file  . Number of children: 2  . Years of education: Not on file  . Highest education level: Not on file  Occupational History  . Occupation: retired  Tobacco Use  . Smoking status: Former Smoker    Types: Cigarettes    Quit date: 01/23/1993    Years since quitting: 27.5  . Smokeless tobacco: Never Used  Vaping Use  . Vaping Use: Never used  Substance and Sexual Activity  . Alcohol use: Yes  Alcohol/week: 2.0 standard drinks    Types: 2 Glasses of wine per week  . Drug use: No  . Sexual activity: Not on file  Other  Topics Concern  . Not on file  Social History Narrative   Patient is married she is retired and has 2 children   About 2 alcoholic beverages a week no drugs, no current tobacco she is a former smoker      Lives at home with her husband   Right handed   Social Determinants of Health   Financial Resource Strain:   . Difficulty of Paying Living Expenses: Not on file  Food Insecurity:   . Worried About Charity fundraiser in the Last Year: Not on file  . Ran Out of Food in the Last Year: Not on file  Transportation Needs:   . Lack of Transportation (Medical): Not on file  . Lack of Transportation (Non-Medical): Not on file  Physical Activity:   . Days of Exercise per Week: Not on file  . Minutes of Exercise per Session: Not on file  Stress:   . Feeling of Stress : Not on file  Social Connections:   . Frequency of Communication with Friends and Family: Not on file  . Frequency of Social Gatherings with Friends and Family: Not on file  . Attends Religious Services: Not on file  . Active Member of Clubs or Organizations: Not on file  . Attends Archivist Meetings: Not on file  . Marital Status: Not on file  Intimate Partner Violence:   . Fear of Current or Ex-Partner: Not on file  . Emotionally Abused: Not on file  . Physically Abused: Not on file  . Sexually Abused: Not on file      PHYSICAL EXAM  Vitals:   08/19/20 1346  BP: 131/73  Pulse: 82  Weight: 133 lb (60.3 kg)  Height: 5' 4.5" (1.638 m)   Body mass index is 22.48 kg/m.  Physical exam:  General: The patient is awake, alert and appears worried, anxious, depressed. The patient is well groomed. She smiles, but is not relaxed.  Head: Normocephalic, atraumatic. Neck is supple. Mallampati 2,  neck circumference:13.75. Nasal airflow patent ,   Cardiovascular:  Regular rate and rhythm without  murmurs or carotid bruit, and without distended neck veins. Respiratory: Lungs are clear to auscultation. Skin:   Without evidence of edema, or rash Trunk: BMI is low - 25.52 kg/m2.  The patient's posture is erect, her body language is that of a very anxious and worried individual.   Neurologic exam : The patient is awake and alert, oriented to place and time.   Memory subjective described as intact.   Attention span & concentration ability appears normal.  Speech is fluent,  without dysarthria, dysphonia -not Having aphasia.  Mood and affect are less anxious. Cranial nerves: Pupils are equal and briskly reactive to light. Facial sensation intact to fine touch. Facial motor strength is symmetric and tongue and uvula move midline. Shoulder shrug was symmetrical.  She has a mild titubation, a mild twitching under left eye and at the left mouth- and in both hands. Low amplitude- the tremor is not a handicap for her. .   Motor exam: normal muscle tone.   All flexion- , extension-, adduction- and abduction is 4 - normal, good grip ! Normal foot lift.  muscle bulk and symmetric strength in all extremities. Sensory:  Fine touch and vibration were felt in both ankles but diminshed in  comparison to 2 years ago  she feels subjectively numb. She reports burning. Finger-to-nose maneuver normal without evidence of ataxia, dysmetria but mild tremor. Gait and station: Patient walks without assistive device and turns with 4 Steps.  Romberg testing is  negative. Deep tendon reflexes: in the upper and lower extremities are symmetric and intact.   DIAGNOSTIC DATA (LABS, IMAGING, TESTING):  - I reviewed patient records, labs, notes, testing and imaging myself where available.  Lab Results  Component Value Date   WBC 6.7 11/14/2012   HGB 13.9 11/14/2012   HCT 41.5 11/14/2012   MCV 90.2 11/14/2012   PLT 229 11/14/2012      Component Value Date/Time   NA 142 12/28/2018 1515   K 4.4 12/28/2018 1515   CL 102 12/28/2018 1515   CO2 23 12/28/2018 1515   GLUCOSE 89 12/28/2018 1515   GLUCOSE 89 08/28/2013 1111    BUN 10 12/28/2018 1515   CREATININE 0.87 12/28/2018 1515   CALCIUM 9.9 12/28/2018 1515   PROT 7.0 12/28/2018 1515   ALBUMIN 4.8 (H) 12/28/2018 1515   AST 27 12/28/2018 1515   ALT 19 12/28/2018 1515   ALKPHOS 97 12/28/2018 1515   BILITOT 0.3 12/28/2018 1515   GFRNONAA 65 12/28/2018 1515   GFRAA 75 12/28/2018 1515   No results found for: CHOL, HDL, LDLCALC, LDLDIRECT, TRIG, CHOLHDL No results found for: HGBA1C No results found for: VITAMINB12 Lab Results  Component Value Date   TSH 1.710 04/30/2019       Assessment:  32 minutes of counseling, discussion of psychological need for support.   After physical and neurologic examination, review of laboratory studies,  Personal review of imaging studies, reports of  neurophysiology testing and pre-existing records as far as provided in visit., my assessment is   1.  Neuropathy is likely associated with endocrine disorder- non diabetic , dyseasthesias. check T4. T3 and thyroid globulin.  2.  Depression and Sleep disturbance- INSOMNIA with anxiety.  3.  Constipation.  We clarified today : At bedtime: gabapentin,  trazodone and xanax , she can't sleep without them. She sometimes sleeps only 3-4 hours  , but gabapentin helps to prolong that sleep.  No sonata while taking ambien/ xanax. She is on Xanax for 35 years (!)-ever since endometriosis was treated by a radical hysterectomy. Stayed on HRT to this day.  Anxiety should be addressed by a therapist.  She also lost weight , 132 pounds, and reports loss of appetite and severe constipation. Depression ? Thyroid imbalance?  Had normal TSH ad T4 earlier this year. I referred her to psychologist/ behavioral therapist.     Dr. Asencion Partridge Omega Slager 08/19/2020, 1:52 PM [facility] [address] [place], [place] [address] [phone]

## 2020-08-20 ENCOUNTER — Encounter: Payer: Self-pay | Admitting: Neurology

## 2020-08-20 LAB — THYROID PEROXIDASE ANTIBODY: Thyroperoxidase Ab SerPl-aCnc: <8 IU/mL (ref 0–34)

## 2020-08-20 LAB — TSH+FREE T4
Free T4: 1.75 ng/dL (ref 0.82–1.77)
TSH: 1.97 u[IU]/mL (ref 0.450–4.500)

## 2020-08-20 LAB — T3 UPTAKE: T3 Uptake Ratio: 29 % (ref 24–39)

## 2020-08-20 NOTE — Progress Notes (Signed)
All thyroid lab tests in normal range

## 2020-08-20 NOTE — Telephone Encounter (Signed)
Patient will come in and see Dr. Carlean Purl tomorrow at 8:50

## 2020-08-21 ENCOUNTER — Encounter: Payer: Self-pay | Admitting: Internal Medicine

## 2020-08-21 ENCOUNTER — Other Ambulatory Visit (INDEPENDENT_AMBULATORY_CARE_PROVIDER_SITE_OTHER): Payer: Medicare PPO

## 2020-08-21 ENCOUNTER — Telehealth: Payer: Self-pay | Admitting: Internal Medicine

## 2020-08-21 ENCOUNTER — Ambulatory Visit: Payer: Medicare PPO | Admitting: Internal Medicine

## 2020-08-21 VITALS — BP 122/68 | HR 65 | Ht 64.5 in | Wt 134.2 lb

## 2020-08-21 DIAGNOSIS — R630 Anorexia: Secondary | ICD-10-CM

## 2020-08-21 DIAGNOSIS — K5904 Chronic idiopathic constipation: Secondary | ICD-10-CM | POA: Diagnosis not present

## 2020-08-21 DIAGNOSIS — R634 Abnormal weight loss: Secondary | ICD-10-CM

## 2020-08-21 DIAGNOSIS — G4700 Insomnia, unspecified: Secondary | ICD-10-CM | POA: Diagnosis not present

## 2020-08-21 MED ORDER — PREDNISONE 50 MG PO TABS: ORAL_TABLET | ORAL | 0 refills | Status: DC

## 2020-08-21 NOTE — Patient Instructions (Addendum)
Your provider has requested that you go to the basement level for lab work before leaving today. Press "B" on the elevator. The lab is located at the first door on the left as you exit the elevator.  We will get your records from [facility] and also from Dr South for review.  We have sent the following medications to your pharmacy for you to pick up at your convenience: Prednisone  Also buy Benedryl , you need one 50mg tablet to take before your CT scan, take this at 4:00pm on 08/28/2020.  You have been scheduled for a CT scan of the abdomen and pelvis at [facility].  You are scheduled on 08/29/19 at 5:00pm. You should arrive 15 minutes prior to your appointment time for registration. Please follow the written instructions below on the day of your exam:  WARNING: IF YOU ARE ALLERGIC TO IODINE/X-RAY DYE, PLEASE NOTIFY RADIOLOGY IMMEDIATELY AT [phone]! YOU WILL BE GIVEN A 13 HOUR PREMEDICATION PREP.  1) Do not eat or drink anything after 1:00pm (4 hours prior to your test)  Drink 1 bottle of contrast @ 3:00pm (2 hours prior to your exam)  Drink 1 bottle of contrast @ 4:00pm (1 hour prior to your exam) Please go to the hospital to pick up your contrast.  You may take any medications as prescribed with a small amount of water, if necessary. If you take any of the following medications: METFORMIN, GLUCOPHAGE, GLUCOVANCE, AVANDAMET, RIOMET, FORTAMET, ACTOPLUS MET, JANUMET, GLUMETZA or METAGLIP, you MAY be asked to HOLD this medication 48 hours AFTER the exam.  The purpose of you drinking the oral contrast is to aid in the visualization of your intestinal tract. The contrast solution may cause some diarrhea. Depending on your individual set of symptoms, you may also receive an intravenous injection of x-ray contrast/dye. Plan on being at  Hospital for 30 minutes or longer, depending on the type of exam you are having performed.  This test typically takes 30-45 minutes  to complete.  If you have any questions regarding your exam or if you need to reschedule, you may call the CT department at [phone] between the hours of 8:00 am and 5:00 pm, Monday-Friday.  ________________________________________________________________________ ___________________   I appreciate the opportunity to care for you. Carl Gessner, MD, FACG 

## 2020-08-21 NOTE — Telephone Encounter (Signed)
Patient called states she is missing the instruction sheet that indicate the 13 hr prep with contrast

## 2020-08-21 NOTE — Progress Notes (Addendum)
Debbie Wells 79 y.o. [birth year] [id number]  Assessment & Plan:   Encounter Diagnoses  Name Primary?  . Loss of weight Yes  . Anorexia   . Chronic idiopathic constipation   . Insomnia, unspecified type    I think she needs a contrasted CT so we will do that with the pretreatment protocol since she has an allergy.  [facility] of information from [facility].  Get labs that she says she has had with Dr. Forde Dandy in the last month or so.  Depending upon CT results consider colonoscopy and possibly EGD though her constipation is really very chronic and may not really be different than in the past question her memory on this  Mirtazapine might be a very good medication for all of her problems though before introducing that would coordinate with Dr. Brett Fairy who has been prescribing trazodone and alprazolam.  Would not want to add it to the mix most likely would probably subtract trazodone possibly.  I did not do a depression scale but she certainly seems depressed to me she certainly has vegetative/physical symptoms.  Need to appropriately exclude other causes of her weight loss first.  Another therapeutic option could be duloxetine to help with chronic pain issues and neuropathy perhaps.  Difficult problem overall.  Orders Placed This Encounter  Procedures  . CT Abdomen Pelvis W Contrast  . Basic metabolic panel   I recommended she contact primary care versus urology regarding her dysuria   I appreciate the opportunity to care for this patient. CC: Debbie Bowen, MD Debbie Seat MD [facility] records have arrived subsequent to her departure after the visit  July 23, 2020 was complaining of bladder pressure dysuria and increased frequency.  Cystoscopy July 2021 diffuse bladder erythema did not look like carcinoma in situ no ulcers.  Hydrodistention did not change the findings.  Bladder biopsies demonstrated cystitis.  There is no firm evidence of  interstitial cystitis.  Records reviewed through March 2021 similar symptoms of dysuria complaining of shakiness, on multiple different suppressive antibiotics she did have a positive culture in the summer and was treated for that gram-negative rod but otherwise lots of dysuria and pelvic pain without objective findings other than the chronic cystitis to support it but not thought to have interstitial cystitis.  Dr. Matilde Sprang recommended pelvic floor physical therapy but she declined.  I think that could be helpful for her.  Addendum 09/03/2020 Records from Dr. Forde Dandy have arrived and are reviewed he visited with her on 08/07/2020.  September labs showed a normal CBC with hemoglobin 13.7, normal TSH free T4.  On electrolytes her sodium was slightly low at 133 otherwise normal.  Dr. Forde Dandy had the impression that many of her issues are related to anxiety depression and obsessive-compulsive tendency.  I would concur with that.  He had recommended psychiatric therapy.  I think that is very reasonable as well.  We will still proceed with a CT scan and see where that leads [place] to try not to overdo things here.  I still think it is reasonable to proceed with that.  CC: Debbie Bowen, MD Dr. Nicki Reaper Mcdiarmid    Subjective:   Chief Complaint: Constipation weight loss anorexia  HPI  The patient is a 79 year old white woman with IBS constipation predominant and history of recurrent abdominal pain and IBS issues last seen in October 2019  She is here today with multiple complaints today include worsening constipation saying she has to take a laxative typically  milk of magnesia and her stools tend to be liquid.  She has lost weight she thinks she was about 152 pounds 4 months ago.  Anorexia severe.  No abdominal pain at this point.  Review of neurology notes show that she appeared depressed, she has severe insomnia taking alprazolam and trazodone for many years, and her husband apparently is developing  dementia and she lost along term friend to end-stage renal disease from notes in May.  Has peripheral neuropathy which is bothersome as well.  She is concerned that she could have mesenteric ischemia.  There is no sitophobia no postprandial pain.  She has seen urology and apparently had hydrodistention of the bladder but does not say she has a diagnosis of interstitial cystitis.  Overnight she awakened with dysuria which is not unusual but apparently maybe the first time since she had the hydrodistention.   CT abdomen and pelvis without contrast through [facility] due to pelvic pain and perineal pain in July of this year-images reviewed personally IMPRESSION: Colonic diverticulosis, without radiographic evidence of diverticulitis or other acute findings.  Stable small paraumbilical ventral hernia containing only fat.  Aortic Atherosclerosis (ICD10-I70.0).  She does feel depressed about the way she is feeling but tells me other things in life are "okay".  Last colonoscopy 2014 -, she was having abdominal wall pain at that time   Rectal exam from October 2019 visit  [name], LPN present Anoderm inspection revealed slight erythema Anal wink was not present Digital exam revealed normal resting tone and voluntary squeeze. No mass or rectocele present. Simulated defecation with valsalva revealed appropriate abdominal contraction and descent.  Wt Readings from Last 3 Encounters:  08/21/20 134 lb 4 oz (60.9 kg)  08/19/20 133 lb (60.3 kg)  04/23/20 140 lb (63.5 kg)     Allergies  Allergen Reactions  . Iodinated Diagnostic Agents Hives    Per patient she has developed hives and itching in upper trunk of body after last 2 CT scans once she got homes that she has just treated at home with benadryl. She has not told anyone until now. States she was able to bear it   . Benazepril Cough  . Hydrocodone-Acetaminophen     Vicodin=REACTION: rash  . Sulfonamide Derivatives      REACTION: rash  . Hydrocodone-Acetaminophen Rash   Current Meds  Medication Sig  . ALPRAZolam (XANAX) 0.5 MG tablet Take 0.25-0.5 mg by mouth 3 (three) times daily as needed. Pt takes a full tab at night and whole a tab during day  . amLODipine (NORVASC) 5 MG tablet TAKE 1 TABLET BY MOUTH TWICE A DAY (Patient taking differently: Take 5 mg by mouth daily. )  . aspirin 81 MG tablet Take 81 mg by mouth daily.   . bacitracin ophthalmic ointment Place 1 application into both eyes as needed. Dry eyes  . cephALEXin (KEFLEX) 250 MG capsule   . estrogens, conjugated, (PREMARIN) 0.45 MG tablet Take 0.45 mg by mouth daily.   Marland Kitchen gabapentin (NEURONTIN) 100 MG capsule Pt may take 1 capsule in the morning as needed and Always 1 capsule at bedtime  . losartan (COZAAR) 50 MG tablet TAKE 2 TABLETS (100 MG TOTAL) BY MOUTH DAILY. KEEP UPCOMING APPOINTMENT FOR REFILLS  . magnesium hydroxide (MILK OF MAGNESIA) 400 MG/5ML suspension Take by mouth daily as needed for moderate constipation.   . metoprolol succinate (TOPROL-XL) 25 MG 24 hr tablet TAKE 1/2 TABLET BY MOUTH EVERY DAY  . Multiple Vitamin (MULTIVITAMIN) capsule Take  1 capsule by mouth daily.  . Omega-3 Fatty Acids (FISH OIL PO) Take 1 tablet by mouth daily.  . polyethylene glycol (MIRALAX / GLYCOLAX) packet Take 17 g by mouth daily as needed.  Marland Kitchen SYNTHROID 75 MCG tablet Take 75 mcg by mouth daily.   . traZODone (DESYREL) 100 MG tablet Take 100-150 mg by mouth at bedtime.   . Wheat Dextrin (BENEFIBER DRINK MIX PO) Take by mouth as needed. Two teaspoons   Past Medical History:  Diagnosis Date  . Adenomatous colon polyp   . Adenomatous polyp of colon 12/03/2004   61mm  . Anxiety   . Chronic headaches   . Chronic insomnia   . Depression   . Diverticulosis   . External hemorrhoids   . Hyperlipidemia   . Hypertension   . Hyperthyroidism   . Hypothyroidism   . Umbilical hernia    Past Surgical History:  Procedure Laterality Date  . ABDOMINAL  HYSTERECTOMY  1975  . APPENDECTOMY  1975  . COLONOSCOPY  03/03/2010,12/03/2004  . ESOPHAGOGASTRODUODENOSCOPY  12/16/11   Dr. Silvano Rusk  . OOPHORECTOMY    . spinal lipoma removed  2006  . TOTAL ABDOMINAL HYSTERECTOMY     Social History   Social History Narrative   Patient is married she is retired and has 2 children   About 2 alcoholic beverages a week no drugs, no current tobacco she is a former smoker      Lives at home with her husband   Right handed   family history includes Addison's disease in her mother; Esophageal cancer in her father; Heart disease in her mother; Heart failure in her mother; Hypertension in her maternal grandmother and mother; Irritable bowel syndrome in her mother; Kidney disease in her mother; Other in her mother; Stomach cancer in her father; Stroke in her maternal grandmother and mother.   Review of Systems As per HPI  Objective:   Physical Exam BP 122/68   Pulse 65   Ht 5' 4.5" (1.638 m)   Wt 134 lb 4 oz (60.9 kg)   BMI 22.69 kg/m  NAD abd soft and NT  Maya CMA present  Rectal- no impaction

## 2020-08-21 NOTE — Telephone Encounter (Signed)
Left Kushi a detailed message that the instructions will be on the prednisone bottle she picks up at the drug store and I reminded her to get the one Benadryl 50mg  tablet as well.

## 2020-08-22 LAB — BASIC METABOLIC PANEL
BUN: 6 mg/dL (ref 6–23)
CO2: 29 mEq/L (ref 19–32)
Calcium: 10 mg/dL (ref 8.4–10.5)
Chloride: 95 mEq/L — ABNORMAL LOW (ref 96–112)
Creatinine, Ser: 0.86 mg/dL (ref 0.40–1.20)
GFR: 69 mL/min (ref >60.00)
Glucose, Bld: 97 mg/dL (ref 70–99)
Potassium: 4.5 mEq/L (ref 3.5–5.1)
Sodium: 132 mEq/L — ABNORMAL LOW (ref 135–145)

## 2020-08-26 ENCOUNTER — Telehealth: Payer: Self-pay | Admitting: Internal Medicine

## 2020-08-26 NOTE — Telephone Encounter (Signed)
Pt's spouse is requesting a call back from a nurse to reschedule pt's CT scan.

## 2020-08-26 NOTE — Telephone Encounter (Signed)
Patient has a UTI and wants to postpone her CT.  She is provided the number to central scheduling and she will call them and reschedule at her convenience.

## 2020-08-28 ENCOUNTER — Ambulatory Visit (HOSPITAL_COMMUNITY): Payer: Medicare PPO

## 2020-09-05 ENCOUNTER — Telehealth: Payer: Self-pay | Admitting: Internal Medicine

## 2020-09-05 NOTE — Telephone Encounter (Signed)
Pt called stating that she feels like she is severely impacted. She would like some advise on what to do.

## 2020-09-05 NOTE — Telephone Encounter (Signed)
No Bm in 4 days- woke up feeling unwell-has a history of constipation. Takes miralax 1 1/2 caps daily.  Was told to take 4 tablespoons MOM at her last office visit.  She did that about 30 min ago, she will wait and see if that produces good results.  If not she is going to take 4-6 doses of miralax.  If no response after that she will call back.

## 2020-09-06 ENCOUNTER — Telehealth: Payer: Self-pay | Admitting: Nurse Practitioner

## 2020-09-06 NOTE — Telephone Encounter (Signed)
Patient followed by Dr Carlean Purl for chronic constipation and most recently weight loss / anorexia.   Patient and husband call with constipation. She had a small amount of results from MOM as recommended our office. She already takes Miralax, nearly everyday. We apparently recommended increasing Miralax but she cannot remember what we told her. We stills constipated, inquiring about coming to ED.   We had scheduled her for a CT scan to evaluate weight loss but she was too sick to have it done.    I advised patient to take 1 capful in miralax now, another at 5pm and another at bedtime today. I also advised trial of suppository or even an enema. Recommended she try these things at home and avoid the ED if she can.

## 2020-09-08 ENCOUNTER — Telehealth: Payer: Self-pay | Admitting: Internal Medicine

## 2020-09-08 NOTE — Telephone Encounter (Signed)
See notes from this weekend.  Patient is advised to take the Miralax as Tye Savoy RNP recommended.  She is reassured that the MOM she took will not harm her as long as she is following the package instructions. She will call back for any additional questions or concerns.

## 2020-09-08 NOTE — Telephone Encounter (Signed)
Patients husband called stated they had a terrible night she has diarrhea and a lot of abdominal discomfort they called 911 and was checked out by EMT they declined going to ER. They were able to sleep after but now has a lot of pelvic pain and is concerned about the chronic diarrhea and her condition and they would like to know what they can do. Patient states she had an upset stomach because she took a lot of milk of magnesia because it didn't work one day so she ended up taking more. Also took an Imodium is seeking advise.

## 2020-09-08 NOTE — Telephone Encounter (Signed)
Pt called again, she is very concerned about the damage of taking too much medication could cause her kidneys. She stated that she took imodium to stop diarrhea but now she is constipated and has abd pain.

## 2020-09-10 ENCOUNTER — Emergency Department (HOSPITAL_COMMUNITY): Payer: Medicare PPO

## 2020-09-10 ENCOUNTER — Emergency Department (HOSPITAL_COMMUNITY)
Admission: EM | Admit: 2020-09-10 | Discharge: 2020-09-10 | Disposition: A | Discharge status: Home / Self Care | Payer: Medicare PPO | Attending: Emergency Medicine | Admitting: Emergency Medicine

## 2020-09-10 ENCOUNTER — Encounter (HOSPITAL_COMMUNITY): Payer: Self-pay

## 2020-09-10 DIAGNOSIS — Z79890 Hormone replacement therapy (postmenopausal): Secondary | ICD-10-CM | POA: Insufficient documentation

## 2020-09-10 DIAGNOSIS — Z79899 Other long term (current) drug therapy: Secondary | ICD-10-CM | POA: Insufficient documentation

## 2020-09-10 DIAGNOSIS — Z87891 Personal history of nicotine dependence: Secondary | ICD-10-CM | POA: Diagnosis not present

## 2020-09-10 DIAGNOSIS — R339 Retention of urine, unspecified: Secondary | ICD-10-CM | POA: Insufficient documentation

## 2020-09-10 DIAGNOSIS — E039 Hypothyroidism, unspecified: Secondary | ICD-10-CM | POA: Diagnosis not present

## 2020-09-10 DIAGNOSIS — Z7982 Long term (current) use of aspirin: Secondary | ICD-10-CM | POA: Diagnosis not present

## 2020-09-10 DIAGNOSIS — R110 Nausea: Secondary | ICD-10-CM | POA: Diagnosis not present

## 2020-09-10 DIAGNOSIS — K5900 Constipation, unspecified: Secondary | ICD-10-CM | POA: Insufficient documentation

## 2020-09-10 DIAGNOSIS — I10 Essential (primary) hypertension: Secondary | ICD-10-CM | POA: Insufficient documentation

## 2020-09-10 LAB — CBC
HCT: 34.7 % — ABNORMAL LOW (ref 36.0–46.0)
Hemoglobin: 11.5 g/dL — ABNORMAL LOW (ref 12.0–15.0)
MCH: 31.1 pg (ref 26.0–34.0)
MCHC: 33.1 g/dL (ref 30.0–36.0)
MCV: 93.8 fL (ref 80.0–100.0)
Platelets: 229 10*3/uL (ref 150–400)
RBC: 3.7 MIL/uL — ABNORMAL LOW (ref 3.87–5.11)
RDW: 13.2 % (ref 11.5–15.5)
WBC: 5.4 10*3/uL (ref 4.0–10.5)
nRBC: 0 % (ref 0.0–0.2)

## 2020-09-10 LAB — COMPREHENSIVE METABOLIC PANEL
ALT: 21 U/L (ref 0–44)
AST: 22 U/L (ref 15–41)
Albumin: 3.9 g/dL (ref 3.5–5.0)
Alkaline Phosphatase: 48 U/L (ref 38–126)
Anion gap: 10 (ref 5–15)
BUN: 12 mg/dL (ref 8–23)
CO2: 24 mmol/L (ref 22–32)
Calcium: 9.1 mg/dL (ref 8.9–10.3)
Chloride: 96 mmol/L — ABNORMAL LOW (ref 98–111)
Creatinine, Ser: 0.88 mg/dL (ref 0.44–1.00)
GFR, Estimated: >60 mL/min (ref >60)
Glucose, Bld: 107 mg/dL — ABNORMAL HIGH (ref 70–99)
Potassium: 4.4 mmol/L (ref 3.5–5.1)
Sodium: 130 mmol/L — ABNORMAL LOW (ref 135–145)
Total Bilirubin: 1 mg/dL (ref 0.3–1.2)
Total Protein: 6.6 g/dL (ref 6.5–8.1)

## 2020-09-10 LAB — URINALYSIS, ROUTINE W REFLEX MICROSCOPIC
Bilirubin Urine: NEGATIVE
Glucose, UA: NEGATIVE mg/dL
Hgb urine dipstick: NEGATIVE
Ketones, ur: NEGATIVE mg/dL
Nitrite: NEGATIVE
Protein, ur: NEGATIVE mg/dL
Specific Gravity, Urine: 1.009 (ref 1.005–1.030)
pH: 6 (ref 5.0–8.0)

## 2020-09-10 LAB — LIPASE, BLOOD: Lipase: 39 U/L (ref 11–51)

## 2020-09-10 MED ORDER — SENNOSIDES-DOCUSATE SODIUM 8.6-50 MG PO TABS: 1.0000 | ORAL_TABLET | Freq: Every day | ORAL | 0 refills | Status: DC

## 2020-09-10 NOTE — ED Provider Notes (Signed)
[facility] DEPT Provider Note   CSN: [id number] Arrival date & time: 09/10/20  1013     History Chief Complaint  Patient presents with   Constipation   Urinary Retention    Debbie Wells is a 79 y.o. female.  Presents to ER with concern for constipation, urinary retention, generally unwell, weight loss.  She reports that she has had weight loss over the last few months, progressive fatigue, malaise.  Has been struggling with diarrhea and constipation.  Had significant constipation last week and then on Sunday had very loose stools, green.  Then has been unable to have a bowel movement since Sunday.  Also this morning she was concerned that she was having difficulty with urination, felt like she was unable to urinate.  No pain with urination, no blood in urine.  No abdominal pain.  No vomiting but has had some nausea.  Out pt GI doc had ordered CT but patient felt too sick to make it to the appt.   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[place]) 12/28/2018   Insomnia due to medical condition 10/12/2018   Hyperthyroidism determined by thyroid function test 10/12/2018   Hypertension due to endocrine disorder 10/12/2018   Tremor observed on examination 10/12/2018   Accelerated hypertension 09/27/2018   Anxiety about health 09/27/2018   Chronic constipation 06/19/2018   Essential hypertension 02/16/2016   IBS (irritable bowel syndrome) 03/12/2014   Personal history of colonic adenoma 02/12/2013   LUQ abdominal  pain 01/23/2013   Chest pain 12/05/2012   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 Silvano Rusk   OOPHORECTOMY     spinal lipoma removed  2006   TOTAL ABDOMINAL HYSTERECTOMY       OB History   No obstetric history on file.     Family History  Problem Relation Age of Onset   Esophageal cancer Father    Stomach cancer Father        mets from esophagus   Heart disease Mother    Irritable bowel syndrome Mother    Kidney disease Mother    Other Mother        cushings disease   Addison's disease Mother    Heart failure Mother    Stroke Mother    Hypertension Mother    Stroke Maternal Grandmother    Hypertension Maternal Grandmother    Colon cancer Neg Hx    Heart attack Neg Hx     Social History   Tobacco Use   Smoking status: Former Smoker    Types: Cigarettes    Quit date: 01/23/1993    Years  since quitting: 27.6   Smokeless tobacco: Never Used  Vaping Use   Vaping Use: Never used  Substance Use Topics   Alcohol use: Yes    Alcohol/week: 2.0 standard drinks    Types: 2 Glasses of wine per week   Drug use: No    Home Medications Prior to Admission medications   Medication Sig Start Date End Date Taking? Authorizing Provider  ALPRAZolam Duanne Moron) 0.5 MG tablet Take 0.25-0.5 mg by mouth 3 (three) times daily as needed. Pt takes a full tab at night and whole a tab during day    [provider]  amLODipine (NORVASC) 5 MG tablet TAKE 1 TABLET BY MOUTH TWICE A DAY Patient taking differently: Take 5 mg by mouth daily.  04/29/20   Lelon Perla, MD  aspirin 81 MG tablet Take 81 mg  by mouth daily.     [provider]  bacitracin ophthalmic ointment Place 1 application into both eyes as needed. Dry eyes 10/01/15   [provider]  cephALEXin (KEFLEX) 250 MG capsule  04/09/20   [provider]  estrogens, conjugated, (PREMARIN) 0.45 MG tablet Take 0.45 mg by mouth daily.     [provider]  gabapentin (NEURONTIN) 100 MG capsule Pt may take 1 capsule in the morning as needed and Always 1 capsule at bedtime 06/26/20   Dohmeier, Asencion Partridge, MD  losartan (COZAAR) 50 MG tablet TAKE 2 TABLETS (100 MG TOTAL) BY MOUTH DAILY. KEEP UPCOMING APPOINTMENT FOR REFILLS 03/26/20   Lelon Perla, MD  magnesium hydroxide (MILK OF MAGNESIA) 400 MG/5ML suspension Take by mouth daily as needed for moderate constipation.     [provider]  metoprolol succinate (TOPROL-XL) 25 MG 24 hr tablet TAKE 1/2 TABLET BY MOUTH EVERY DAY 11/21/19   Lelon Perla, MD  Multiple Vitamin (MULTIVITAMIN) capsule Take 1 capsule by mouth daily.    [provider]  Omega-3 Fatty Acids (FISH OIL PO) Take 1 tablet by mouth daily.    [provider]  polyethylene glycol (MIRALAX / GLYCOLAX) packet Take 17 g by mouth daily as needed.    [provider]  predniSONE (DELTASONE) 50 MG tablet CT pre-meds Take one tablet by mouth at 4AM on 08/28/2020, then take one tablet by mouth at 10:00am on 08/28/2020, then take one tablet by mouth at 4:00pm on 08/28/2020 08/21/20   Gatha Mayer, MD  SYNTHROID 75 MCG tablet Take 75 mcg by mouth daily.  03/15/18   [provider]  traZODone (DESYREL) 100 MG tablet Take 100-150 mg by mouth at bedtime.  11/12/14   [provider]  Wheat Dextrin (BENEFIBER DRINK MIX PO) Take by mouth as needed. Two teaspoons    [provider]    Allergies    Iodinated diagnostic agents, Benazepril, Hydrocodone-acetaminophen, Sulfonamide derivatives, and Hydrocodone-acetaminophen  Review of Systems   Review  of Systems  Constitutional: Negative for chills and fever.  HENT: Negative for ear pain and sore throat.   Eyes: Negative for pain and visual disturbance.  Respiratory: Negative for cough and shortness of breath.   Cardiovascular: Negative for chest pain and palpitations.  Gastrointestinal: Positive for abdominal pain. Negative for vomiting.  Genitourinary: Negative for dysuria and hematuria.  Musculoskeletal: Negative for arthralgias and back pain.  Skin: Negative for color change and rash.  Neurological: Negative for seizures and syncope.  All other systems reviewed and are negative.   Physical Exam Updated Vital Signs BP (!) 142/67    Pulse  78    Temp 97.9 F (36.6 C) (Oral)    Resp 10    Ht 5' 4.5" (1.638 m)    Wt 59 kg    SpO2 98%    BMI 21.97 kg/m   Physical Exam Vitals and nursing note reviewed.  Constitutional:      General: She is not in acute distress.    Appearance: She is well-developed.  HENT:     Head: Normocephalic and atraumatic.  Eyes:     Conjunctiva/sclera: Conjunctivae normal.  Cardiovascular:     Rate and Rhythm: Normal rate and regular rhythm.     Heart sounds: No murmur heard.   Pulmonary:     Effort: Pulmonary effort is normal. No respiratory distress.     Breath sounds: Normal breath sounds.  Abdominal:     Tenderness: There is no abdominal tenderness.     Comments: Some tenderness noted in her right lower quadrant, left lower quadrant, no rebound or guarding  Musculoskeletal:        General: No deformity or signs of injury.     Cervical back: Neck supple.  Skin:    General: Skin is warm and dry.  Neurological:     General: No focal deficit present.     Mental Status: She is alert.  Psychiatric:        Mood and Affect: Mood normal.     ED Results / Procedures / Treatments   Labs (all labs ordered are listed, but only abnormal results are displayed) Labs Reviewed  COMPREHENSIVE METABOLIC PANEL - Abnormal; Notable for the following  components:      Result Value   Sodium 130 (*)    Chloride 96 (*)    Glucose, Bld 107 (*)    All other components within normal limits  CBC - Abnormal; Notable for the following components:   RBC 3.70 (*)    Hemoglobin 11.5 (*)    HCT 34.7 (*)    All other components within normal limits  URINALYSIS, ROUTINE W REFLEX MICROSCOPIC - Abnormal; Notable for the following components:   Leukocytes,Ua SMALL (*)    Bacteria, UA FEW (*)    All other components within normal limits  LIPASE, BLOOD    EKG None  Radiology No results found.  Procedures Procedures (including critical care time)  Medications Ordered in ED Medications - No data to display  ED Course  I have reviewed the triage vital signs and the nursing notes.  Pertinent labs & imaging results that were available during my care of the patient were reviewed by me and considered in my medical decision making (see chart for details).    MDM Rules/Calculators/A&P                          79 year old lady presenting to ER with concern for diarrhea, constipation, malaise, concern for possible urinary retention.  Is well-appearing with normal vital signs.  Abdomen soft but did note some tenderness in her lower belly.  UA negative for infection, labs grossly stable with mild hyponatremia.  CT scan ordered to further assess given the tenderness that was noted on my exam.  Rule out acute abdominal pathology.  Regarding her concern for urinary retention, patient was able to void spontaneously multiple times in ER but she was noted to have PVR in 200s.  Recommended placing Foley catheter however patient was very reluctant to do this and declined. In lieu of Foley, recommended close urology  follow-up to further discuss  While awaiting CT scan and reassessment, patient signed out to Dr. Roderic Palau.  Final Clinical Impression(s) / ED Diagnoses Final diagnoses:  Urinary retention  Constipation, unspecified constipation type    Rx / DC  Orders ED Discharge Orders    None       Lucrezia Starch, MD 09/10/20 1502

## 2020-09-10 NOTE — Discharge Instructions (Addendum)
Please follow-up both with your gastroenterologist as well as your urologist.  Continue milk of magnesia or MiraLAX and recommend starting Colace or Peri-Colace.  If you have any further issues with urinary retention, worsening abdominal pain or other new concerning symptom, return to ER for reassessment.,     follow up with dr. Leroy Kennedy

## 2020-09-10 NOTE — Progress Notes (Signed)
Chaplain engaged in initial visit with Debbie Wells and her husband.  Debbie Wells was standing outside of [facility] room in need of assistance.  Chaplain provided assistance by getting a cup of water for Debbie Wells to be able to digest medicine that doctor stated she was cleared to take.  Debbie Wells was also in need of a mask.  Chaplain brought him a mask.   Chaplain offered support and explained her role.  Chaplain will follow-up as needed.     09/10/20 1200  Clinical Encounter Type  Visited With Patient and family together  Visit Type Initial

## 2020-09-10 NOTE — ED Triage Notes (Addendum)
Pt brought by husband  Pt reports diarrhea all day Sunday and last BM Sunday and woke up this morning and unable to urinate.  C/O dry mouth  Denies pain at this time Denies N/V at this time  Pt in wheelchair    A/OX3 patient did not know the month

## 2020-09-10 NOTE — ED Notes (Signed)
Pt transported to CT ?

## 2020-09-12 ENCOUNTER — Telehealth: Payer: Self-pay | Admitting: Internal Medicine

## 2020-09-12 ENCOUNTER — Other Ambulatory Visit: Payer: Self-pay | Admitting: Cardiology

## 2020-09-12 NOTE — Telephone Encounter (Signed)
I spoke with Debbie Wells and she said she had to go to the ED on Wednesday 09/10/20, and she had a CT scan. She has no fever, very little abdominal pain. She is taking Miralax BID, her last bowel movement was last Sunday and today had only a "smear" of stool. When she took MOM it gave her diarrhea and it was green which concerns her. She wanted to know why was it green.

## 2020-09-12 NOTE — Telephone Encounter (Signed)
She wants to know Sir what to do to get un constipated.

## 2020-09-12 NOTE — Telephone Encounter (Signed)
Green color is from bile that can be seen at times  This is not a problem  We can cancel the CT that was ordered by [place] - I do not think there is a date attached to it as she had postponed it

## 2020-09-15 ENCOUNTER — Telehealth: Payer: Self-pay | Admitting: Cardiology

## 2020-09-15 NOTE — Telephone Encounter (Signed)
Pt is concerned because she feels she is not urinating as much as she should be. She states she is not on a diuretic. She has not been on a diuretic for years. She was on a diuretic the last time she was on blood pressure medicine.  She said her bladder has not been emptying like normal. She recently went to the ER and she has an appointment at [facility] tomorrow.

## 2020-09-15 NOTE — Telephone Encounter (Signed)
Toniann informed and will try this plan and she will let us know if still having problems.

## 2020-09-15 NOTE — Telephone Encounter (Signed)
MiraLax daily Prn Dulcolax 1-2

## 2020-09-15 NOTE — Telephone Encounter (Signed)
Pt will await appt with Urology tomorrow. She will go to the ER should issues arise.

## 2020-09-17 ENCOUNTER — Ambulatory Visit: Payer: Medicare PPO | Admitting: Internal Medicine

## 2020-09-21 ENCOUNTER — Other Ambulatory Visit: Payer: Self-pay

## 2020-09-21 ENCOUNTER — Encounter (HOSPITAL_COMMUNITY): Payer: Self-pay

## 2020-09-21 ENCOUNTER — Emergency Department (HOSPITAL_COMMUNITY): Payer: Medicare PPO

## 2020-09-21 ENCOUNTER — Emergency Department (HOSPITAL_COMMUNITY)
Admission: EM | Admit: 2020-09-21 | Discharge: 2020-09-21 | Disposition: A | Discharge status: Home / Self Care | Payer: Medicare PPO | Attending: Emergency Medicine | Admitting: Emergency Medicine

## 2020-09-21 DIAGNOSIS — R350 Frequency of micturition: Secondary | ICD-10-CM | POA: Diagnosis present

## 2020-09-21 DIAGNOSIS — Z7982 Long term (current) use of aspirin: Secondary | ICD-10-CM | POA: Diagnosis not present

## 2020-09-21 DIAGNOSIS — R300 Dysuria: Secondary | ICD-10-CM | POA: Insufficient documentation

## 2020-09-21 DIAGNOSIS — Z87891 Personal history of nicotine dependence: Secondary | ICD-10-CM | POA: Insufficient documentation

## 2020-09-21 DIAGNOSIS — E039 Hypothyroidism, unspecified: Secondary | ICD-10-CM | POA: Insufficient documentation

## 2020-09-21 DIAGNOSIS — R339 Retention of urine, unspecified: Secondary | ICD-10-CM

## 2020-09-21 DIAGNOSIS — K5900 Constipation, unspecified: Secondary | ICD-10-CM | POA: Insufficient documentation

## 2020-09-21 DIAGNOSIS — I119 Hypertensive heart disease without heart failure: Secondary | ICD-10-CM | POA: Diagnosis not present

## 2020-09-21 DIAGNOSIS — Z79899 Other long term (current) drug therapy: Secondary | ICD-10-CM | POA: Diagnosis not present

## 2020-09-21 DIAGNOSIS — R1030 Lower abdominal pain, unspecified: Secondary | ICD-10-CM | POA: Diagnosis not present

## 2020-09-21 LAB — URINALYSIS, ROUTINE W REFLEX MICROSCOPIC
Bilirubin Urine: NEGATIVE
Glucose, UA: NEGATIVE mg/dL
Hgb urine dipstick: NEGATIVE
Ketones, ur: NEGATIVE mg/dL
Nitrite: NEGATIVE
Protein, ur: NEGATIVE mg/dL
Specific Gravity, Urine: 1.003 — ABNORMAL LOW (ref 1.005–1.030)
pH: 8 (ref 5.0–8.0)

## 2020-09-21 LAB — CBC WITH DIFFERENTIAL/PLATELET
Abs Immature Granulocytes: 0.02 10*3/uL (ref 0.00–0.07)
Basophils Absolute: 0 10*3/uL (ref 0.0–0.1)
Basophils Relative: 1 %
Eosinophils Absolute: 0.1 10*3/uL (ref 0.0–0.5)
Eosinophils Relative: 1 %
HCT: 37.8 % (ref 36.0–46.0)
Hemoglobin: 12.8 g/dL (ref 12.0–15.0)
Immature Granulocytes: 0 %
Lymphocytes Relative: 15 %
Lymphs Abs: 0.9 10*3/uL (ref 0.7–4.0)
MCH: 31.1 pg (ref 26.0–34.0)
MCHC: 33.9 g/dL (ref 30.0–36.0)
MCV: 91.7 fL (ref 80.0–100.0)
Monocytes Absolute: 0.7 10*3/uL (ref 0.1–1.0)
Monocytes Relative: 12 %
Neutro Abs: 4 10*3/uL (ref 1.7–7.7)
Neutrophils Relative %: 71 %
Platelets: 264 10*3/uL (ref 150–400)
RBC: 4.12 MIL/uL (ref 3.87–5.11)
RDW: 12.9 % (ref 11.5–15.5)
WBC: 5.7 10*3/uL (ref 4.0–10.5)
nRBC: 0 % (ref 0.0–0.2)

## 2020-09-21 LAB — COMPREHENSIVE METABOLIC PANEL
ALT: 23 U/L (ref 0–44)
AST: 29 U/L (ref 15–41)
Albumin: 4.4 g/dL (ref 3.5–5.0)
Alkaline Phosphatase: 63 U/L (ref 38–126)
Anion gap: 5 (ref 5–15)
BUN: 9 mg/dL (ref 8–23)
CO2: 28 mmol/L (ref 22–32)
Calcium: 9.3 mg/dL (ref 8.9–10.3)
Chloride: 95 mmol/L — ABNORMAL LOW (ref 98–111)
Creatinine, Ser: 0.8 mg/dL (ref 0.44–1.00)
GFR, Estimated: >60 mL/min (ref >60)
Glucose, Bld: 107 mg/dL — ABNORMAL HIGH (ref 70–99)
Potassium: 4.1 mmol/L (ref 3.5–5.1)
Sodium: 128 mmol/L — ABNORMAL LOW (ref 135–145)
Total Bilirubin: 0.9 mg/dL (ref 0.3–1.2)
Total Protein: 7.4 g/dL (ref 6.5–8.1)

## 2020-09-21 MED ORDER — SODIUM CHLORIDE 0.9 % IV BOLUS
1000.0000 mL | Freq: Once | INTRAVENOUS | Status: AC
  Administered 2020-09-21: 1000 mL via INTRAVENOUS

## 2020-09-21 MED ORDER — FLEET ENEMA 7-19 GM/118ML RE ENEM
1.0000 | ENEMA | Freq: Once | RECTAL | Status: AC
  Administered 2020-09-21: 1 via RECTAL
  Filled 2020-09-21: qty 1

## 2020-09-21 MED ORDER — ONDANSETRON HCL 4 MG/2ML IJ SOLN
4.0000 mg | Freq: Once | INTRAMUSCULAR | Status: AC
  Administered 2020-09-21: 4 mg via INTRAVENOUS
  Filled 2020-09-21: qty 2

## 2020-09-21 MED ORDER — MORPHINE SULFATE (PF) 4 MG/ML IV SOLN
4.0000 mg | Freq: Once | INTRAVENOUS | Status: AC
  Administered 2020-09-21: 4 mg via INTRAVENOUS
  Filled 2020-09-21: qty 1

## 2020-09-21 MED ORDER — LIDOCAINE HCL URETHRAL/MUCOSAL 2 % EX GEL
1.0000 "application " | Freq: Once | CUTANEOUS | Status: AC
  Administered 2020-09-21: 1 via TOPICAL
  Filled 2020-09-21: qty 11

## 2020-09-21 NOTE — ED Provider Notes (Signed)
Debbie Wells   CSN: [id number] Arrival date & time: 09/21/20  8756     History No chief complaint on file.   Debbie Wells is a 79 y.o. female.  79 yo F with a chief complaints of increased urinary frequency and dysuria.  This is been going on since yesterday.  She took some over-the-counter medicine which she thinks made it worse.  Denies fevers or flank pain.  Was seen in the ED about a month ago with similar symptoms.  She feels like it was similar but now is worse.  No nausea or vomiting.  Constipated.  The history is provided by the patient and the EMS personnel.  Illness Severity:  Moderate Onset quality:  Gradual Duration:  1 day Timing:  Constant Progression:  Worsening Chronicity:  Recurrent Associated symptoms: abdominal pain   Associated symptoms: no chest pain, no congestion, no fever, no headaches, no myalgias, no nausea, no rhinorrhea, no shortness of breath, no vomiting and no wheezing        Past Medical History:  Diagnosis Date  . Adenomatous colon polyp   . Adenomatous polyp of colon 12/03/2004   64mm  . Anxiety   . Chronic headaches   . Chronic insomnia   . Depression   . Diverticulosis   . External hemorrhoids   . Hyperlipidemia   . Hypertension   . Hyperthyroidism   . Hypothyroidism   . Umbilical hernia     Patient Active Problem List   Diagnosis Date Noted  . Chronic insomnia 03/20/2020  . 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 Insomnia due to medical condition 10/12/2018  . Hyperthyroidism determined by thyroid function test 10/12/2018  . Hypertension due to endocrine disorder 10/12/2018  . Tremor observed on examination 10/12/2018  . Accelerated hypertension 09/27/2018  . Anxiety about health 09/27/2018  . Chronic constipation 06/19/2018  . Essential hypertension 02/16/2016  . IBS (irritable bowel syndrome) 03/12/2014  . Personal history of colonic adenoma  02/12/2013  . LUQ abdominal pain 01/23/2013  . Chest pain 12/05/2012  . Benign hypertensive heart disease without heart failure 12/05/2012  . Pure hypercholesterolemia 12/05/2012  . Umbilical hernia 43/32/9518  . Chronic  upper abdominal pain 06/24/2012  . HEMORRHOIDS 07/22/2008    Past Surgical History:  Procedure Laterality Date  . ABDOMINAL HYSTERECTOMY  1975  . APPENDECTOMY  1975  . COLONOSCOPY  03/03/2010,12/03/2004  . ESOPHAGOGASTRODUODENOSCOPY  12/16/11   Dr. Silvano Rusk  . OOPHORECTOMY    . spinal lipoma removed  2006  . TOTAL ABDOMINAL HYSTERECTOMY       OB History   No obstetric history on file.     Family History  Problem Relation Age of Onset  . Esophageal cancer Father   . Stomach cancer Father        mets from esophagus  . Heart disease Mother   . Irritable bowel syndrome Mother   . Kidney disease Mother   . Other Mother        cushings disease  . Addison's disease Mother   . Heart failure Mother   . Stroke Mother   . Hypertension Mother   . Stroke Maternal Grandmother   . Hypertension Maternal Grandmother   . Colon cancer Neg Hx   . Heart attack Neg Hx     Social History   Tobacco Use  . Smoking status: Former Smoker    Types: Cigarettes    Quit date: 01/23/1993  Years since quitting: 27.6  . Smokeless tobacco: Never Used  Vaping Use  . Vaping Use: Never used  Substance Use Topics  . Alcohol use: Yes    Alcohol/week: 2.0 standard drinks    Types: 2 Glasses of wine per week  . Drug use: No    Home Medications Prior to Admission medications   Medication Sig Start Date End Date Taking? Authorizing Provider  ALPRAZolam Duanne Moron) 0.5 MG tablet Take 0.25-0.5 mg by mouth 3 (three) times daily as needed for anxiety or sleep. Pt takes a full tab at night and whole a tab during day   Yes [provider]  amLODipine (NORVASC) 5 MG tablet TAKE 1 TABLET BY MOUTH TWICE A DAY Patient taking differently: Take 5 mg by mouth daily.  04/29/20  Yes  Lelon Perla, MD  aspirin 81 MG tablet Take 81 mg by mouth daily.    Yes [provider]  bacitracin ophthalmic ointment Place 1 application into both eyes daily as needed for wound care. Dry eyes 10/01/15  Yes [provider]  cephALEXin (KEFLEX) 250 MG capsule Take 250 mg by mouth daily.  04/09/20  Yes [provider]  escitalopram (LEXAPRO) 10 MG tablet Take 10 mg by mouth daily. 08/04/20  Yes [provider]  estrogens, conjugated, (PREMARIN) 0.45 MG tablet Take 0.45 mg by mouth daily.    Yes [provider]  gabapentin (NEURONTIN) 100 MG capsule Pt may take 1 capsule in the morning as needed and Always 1 capsule at bedtime Patient taking differently: Take 100 mg by mouth 3 (three) times daily.  06/26/20  Yes Dohmeier, Asencion Partridge, MD  losartan (COZAAR) 50 MG tablet TAKE 2 TABLETS (100 MG TOTAL) BY MOUTH DAILY. KEEP UPCOMING APPOINTMENT FOR REFILLS Patient taking differently: Take 50 mg by mouth in the morning and at bedtime.  03/26/20  Yes Lelon Perla, MD  Meth-Hyo-M Barnett Hatter Phos-Ph Sal (URIBEL) 118 MG CAPS Take 1 capsule by mouth daily as needed (For urine).  09/19/20  Yes [provider]  metoprolol succinate (TOPROL-XL) 25 MG 24 hr tablet TAKE 1/2 TABLET BY MOUTH EVERY DAY Patient taking differently: Take 12.5 mg by mouth daily.  09/12/20  Yes Lelon Perla, MD  Multiple Vitamin (MULTIVITAMIN) capsule Take 1 capsule by mouth daily.   Yes [provider]  Omega-3 Fatty Acids (FISH OIL PO) Take 1 tablet by mouth daily.   Yes [provider]  polyethylene glycol (MIRALAX / GLYCOLAX) packet Take 17 g by mouth daily as needed for mild constipation.    Yes [provider]  SYNTHROID 75 MCG tablet Take 75 mcg by mouth daily.  03/15/18  Yes [provider]  traZODone (DESYREL) 100 MG tablet Take 150 mg by mouth at bedtime.  11/12/14  Yes [provider]  predniSONE (DELTASONE) 50 MG tablet CT  pre-meds Take one tablet by mouth at 4AM on 08/28/2020, then take one tablet by mouth at 10:00am on 08/28/2020, then take one tablet by mouth at 4:00pm on 08/28/2020 Patient not taking: Reported on 09/10/2020 08/21/20   Gatha Mayer, MD  senna-docusate (SENOKOT-S) 8.6-50 MG tablet Take 1 tablet by mouth at bedtime. Patient not taking: Reported on 09/21/2020 09/10/20   Lucrezia Starch, MD    Allergies    Iodinated diagnostic agents, Benazepril, Hydrocodone-acetaminophen, and Sulfonamide derivatives  Review of Systems   Review of Systems  Constitutional: Negative for chills and fever.  HENT: Negative for congestion and rhinorrhea.   Eyes:  Negative for redness and visual disturbance.  Respiratory: Negative for shortness of breath and wheezing.   Cardiovascular: Negative for chest pain and palpitations.  Gastrointestinal: Positive for abdominal pain. Negative for nausea and vomiting.  Genitourinary: Positive for dysuria and frequency. Negative for urgency.  Musculoskeletal: Negative for arthralgias and myalgias.  Skin: Negative for pallor and wound.  Neurological: Negative for dizziness and headaches.    Physical Exam Updated Vital Signs BP (!) 168/65   Pulse 92   Temp 97.9 F (36.6 C) (Oral)   Resp 17   SpO2 97%   Physical Exam Vitals and nursing Wells reviewed.  Constitutional:      General: She is not in acute distress.    Appearance: She is well-developed. She is not diaphoretic.  HENT:     Head: Normocephalic and atraumatic.  Eyes:     Pupils: Pupils are equal, round, and reactive to light.  Cardiovascular:     Rate and Rhythm: Normal rate and regular rhythm.     Heart sounds: No murmur heard.  No friction rub. No gallop.   Pulmonary:     Effort: Pulmonary effort is normal.     Breath sounds: No wheezing or rales.  Abdominal:     General: There is no distension.     Palpations: Abdomen is soft.     Tenderness: There is abdominal tenderness (mild suprapubic).    Genitourinary:    Comments: No hemorrhoids, no stool in the vault.  External vaginal exam largely unremarkable.  No erythema no lichen sclerosis no discharge.  No erythema around the urethra. Musculoskeletal:        General: No tenderness.     Cervical back: Normal range of motion and neck supple.  Skin:    General: Skin is warm and dry.  Neurological:     Mental Status: She is alert and oriented to person, place, and time.  Psychiatric:        Behavior: Behavior normal.     ED Results / Procedures / Treatments   Labs (all labs ordered are listed, but only abnormal results are displayed) Labs Reviewed  COMPREHENSIVE METABOLIC PANEL - Abnormal; Notable for the following components:      Result Value   Sodium 128 (*)    Chloride 95 (*)    Glucose, Bld 107 (*)    All other components within normal limits  URINALYSIS, ROUTINE W REFLEX MICROSCOPIC - Abnormal; Notable for the following components:   Color, Urine GREEN (*)    Specific Gravity, Urine 1.003 (*)    Leukocytes,Ua SMALL (*)    Bacteria, UA FEW (*)    All other components within normal limits  URINE CULTURE  CBC WITH DIFFERENTIAL/PLATELET    EKG None  Radiology CT ABDOMEN PELVIS WO CONTRAST  Result Date: 09/21/2020 CLINICAL DATA:  Abdominal pain, constipation, dysuria EXAM: CT ABDOMEN AND PELVIS WITHOUT CONTRAST TECHNIQUE: Multidetector CT imaging of the abdomen and pelvis was performed following the standard protocol without IV contrast. COMPARISON:  09/10/2020 FINDINGS: Lower chest: New small area of atelectasis or consolidation at the left lung base. Hepatobiliary: No focal liver abnormality is seen. No gallstones, gallbladder wall thickening, or biliary dilatation. Pancreas: Unremarkable. Spleen: Unremarkable. Adrenals/Urinary Tract: Adrenal is unremarkable. There is bilateral hydroureteronephrosis. No obstructing calculus. Bladder is distended. Stomach/Bowel: Small hiatal hernia. Bowel is normal in caliber.  Sigmoid diverticulosis. Vascular/Lymphatic: Aortic atherosclerosis. No enlarged lymph nodes identified Reproductive: Status post hysterectomy. No adnexal masses. Other: No ascites.  No acute abnormality of the  abdominal wall. Musculoskeletal: No acute osseous abnormality. IMPRESSION: Distended bladder with bilateral hydroureteronephrosis. No obstructing calculi identified. Sigmoid diverticulosis. New small area of atelectasis or consolidation at the left lung base. Electronically Signed   By: Macy Mis M.D.   On: 09/21/2020 13:16    Procedures Procedures (including critical care time)  Medications Ordered in ED Medications  sodium chloride 0.9 % bolus 1,000 mL (0 mLs Intravenous Stopped 09/21/20 1239)  morphine 4 MG/ML injection 4 mg (4 mg Intravenous Given 09/21/20 1102)  ondansetron (ZOFRAN) injection 4 mg (4 mg Intravenous Given 09/21/20 1102)  sodium phosphate (FLEET) 7-19 GM/118ML enema 1 enema (1 enema Rectal Given 09/21/20 1239)    ED Course  I have reviewed the triage vital signs and the nursing notes.  Pertinent labs & imaging results that were available during my care of the patient were reviewed by me and considered in my medical decision making (see chart for details).    MDM Rules/Calculators/A&P                          79 yo F with a chief complaints of increased urinary frequency and dysuria.  Also having lower abdominal pain.  Was seen in the ED about a month ago with similar presentation.  Had a CT scan at that time that was negative for acute pathology but showed a moderate stool burden.  Will obtain a laboratory evaluation again today.  UA.   Family has arrived and is able to provide further history.  Not eating and drinking as well as normal for the past couple weeks.  Recurrent abdominal pain.  Problems with constipation off and on.  Rectal exam with no significant stool in the vault.  She had 980 cc of urine on bladder scan.  Patient feels like she really needs  to have a bowel movement.  Will give an enema and reassess.  Patient is now had multiple bowel movements and initially felt a little bit better and then later started by me that she was having some continued pelvic discomfort.  She had a repeat ultrasound of the bladder that showed that she had over 550 cc of urine.  External vaginal exam without obvious anatomical cause.  Will place a Foley.  Signed out to Dr. Ralene Bathe, please see their Wells for further details of care in the ED.  The patients results and plan were reviewed and discussed.   Any x-rays performed were independently reviewed by myself.   Differential diagnosis were considered with the presenting HPI.  Medications  sodium chloride 0.9 % bolus 1,000 mL (0 mLs Intravenous Stopped 09/21/20 1239)  morphine 4 MG/ML injection 4 mg (4 mg Intravenous Given 09/21/20 1102)  ondansetron (ZOFRAN) injection 4 mg (4 mg Intravenous Given 09/21/20 1102)  sodium phosphate (FLEET) 7-19 GM/118ML enema 1 enema (1 enema Rectal Given 09/21/20 1239)    Vitals:   09/21/20 0915 09/21/20 0916 09/21/20 1115 09/21/20 1308  BP: (!) 172/68  (!) 143/50 (!) 168/65  Pulse: 66  69 92  Resp: 13  12 17   Temp:  97.9 F (36.6 C)    TempSrc:  Oral    SpO2: 98%  95% 97%    Final diagnoses:  Urinary retention  Constipation, unspecified constipation type    Admission/ observation were discussed with the admitting physician, patient and/or family and they are comfortable with the plan.     Final Clinical Impression(s) / ED Diagnoses Final diagnoses:  Urinary retention  Constipation, unspecified constipation type    Rx / DC Orders ED Discharge Orders    None       Deno Etienne, DO 09/21/20 1538

## 2020-09-21 NOTE — ED Provider Notes (Signed)
Patient care assumed at 1500. Patient here with acute on chronic pelvic pain, constipation. Patient care assumed pending trial of Foley catheter placement for urinary retention. Initially after patient's catheter was placed she had increased pain but after small amount of time her pain returned to her baseline chronic pain. Patient is requesting admission to the hospital for ongoing pain. Source of her chronic pain is unclear but she has had extensive outpatient workup. Discussed with hospitalist option for observation for ongoing pain and admission was declined due to no clear admission criteria at this time. Discussed with patient options for managing her pain at home as well as close urology follow-up. Also recommend Uro-gyn follow-up.   Quintella Reichert, MD 09/21/20 2036

## 2020-09-21 NOTE — Discharge Instructions (Addendum)
Follow up with your family doc and urologist in the office.

## 2020-09-21 NOTE — ED Triage Notes (Signed)
Patient presented c/o constipation and painful urination with burning sensation.

## 2020-09-22 LAB — URINE CULTURE: Culture: NO GROWTH

## 2020-09-23 ENCOUNTER — Ambulatory Visit: Payer: Medicare PPO | Admitting: Adult Health

## 2020-09-24 ENCOUNTER — Telehealth: Payer: Self-pay | Admitting: Neurology

## 2020-09-24 NOTE — Telephone Encounter (Signed)
Pt.'s son Gwyndolyn Saxon is on [place]. He would like to speak with Dr. Directly about mom's situation, but mostly about her medication. Please advise.

## 2020-09-24 NOTE — Telephone Encounter (Addendum)
I returned the call to the patient's son, Takiah Maiden (on [employer], [employer]). Reports she has been feeling jittery and noticing tremors recently. They feel these symptoms may be related to some of her prescriptions. They are trying to rule out different medications as the culprit. Specifically, he wants to explore the option of changing gabapentin to pregabalin. He is requesting to have this conversation directly with Dr. Brett Fairy and understands she is our of the office this week. He would like a call back from her when she returns. He mentioned that mother is very happy with her care here and I felt this was noteworthy.

## 2020-09-26 ENCOUNTER — Other Ambulatory Visit: Payer: Self-pay

## 2020-09-26 ENCOUNTER — Emergency Department (HOSPITAL_COMMUNITY)
Admission: EM | Admit: 2020-09-26 | Discharge: 2020-09-26 | Disposition: A | Discharge status: Home / Self Care | Payer: Medicare PPO | Attending: Emergency Medicine | Admitting: Emergency Medicine

## 2020-09-26 ENCOUNTER — Encounter (HOSPITAL_COMMUNITY): Payer: Self-pay | Admitting: Emergency Medicine

## 2020-09-26 DIAGNOSIS — Z87891 Personal history of nicotine dependence: Secondary | ICD-10-CM | POA: Diagnosis not present

## 2020-09-26 DIAGNOSIS — E039 Hypothyroidism, unspecified: Secondary | ICD-10-CM | POA: Diagnosis not present

## 2020-09-26 DIAGNOSIS — Z79899 Other long term (current) drug therapy: Secondary | ICD-10-CM | POA: Insufficient documentation

## 2020-09-26 DIAGNOSIS — Z20822 Contact with and (suspected) exposure to covid-19: Secondary | ICD-10-CM | POA: Diagnosis not present

## 2020-09-26 DIAGNOSIS — I10 Essential (primary) hypertension: Secondary | ICD-10-CM | POA: Insufficient documentation

## 2020-09-26 DIAGNOSIS — W01198A Fall on same level from slipping, tripping and stumbling with subsequent striking against other object, initial encounter: Secondary | ICD-10-CM | POA: Diagnosis not present

## 2020-09-26 DIAGNOSIS — F321 Major depressive disorder, single episode, moderate: Secondary | ICD-10-CM | POA: Insufficient documentation

## 2020-09-26 DIAGNOSIS — R197 Diarrhea, unspecified: Secondary | ICD-10-CM | POA: Diagnosis not present

## 2020-09-26 DIAGNOSIS — Y9289 Other specified places as the place of occurrence of the external cause: Secondary | ICD-10-CM | POA: Insufficient documentation

## 2020-09-26 DIAGNOSIS — Y9389 Activity, other specified: Secondary | ICD-10-CM | POA: Insufficient documentation

## 2020-09-26 DIAGNOSIS — W19XXXA Unspecified fall, initial encounter: Secondary | ICD-10-CM

## 2020-09-26 LAB — RESP PANEL BY RT-PCR (FLU A&B, COVID) ARPGX2
Influenza A by PCR: NEGATIVE
Influenza B by PCR: NEGATIVE
SARS Coronavirus 2 by RT PCR: NEGATIVE

## 2020-09-26 LAB — RAPID URINE DRUG SCREEN, HOSP PERFORMED
Amphetamines: NOT DETECTED
Barbiturates: NOT DETECTED
Benzodiazepines: POSITIVE — AB
Cocaine: NOT DETECTED
Opiates: NOT DETECTED
Tetrahydrocannabinol: NOT DETECTED

## 2020-09-26 LAB — CBC WITH DIFFERENTIAL/PLATELET
Abs Immature Granulocytes: 0.03 10*3/uL (ref 0.00–0.07)
Basophils Absolute: 0 10*3/uL (ref 0.0–0.1)
Basophils Relative: 1 %
Eosinophils Absolute: 0.1 10*3/uL (ref 0.0–0.5)
Eosinophils Relative: 1 %
HCT: 37.5 % (ref 36.0–46.0)
Hemoglobin: 12.7 g/dL (ref 12.0–15.0)
Immature Granulocytes: 0 %
Lymphocytes Relative: 15 %
Lymphs Abs: 1 10*3/uL (ref 0.7–4.0)
MCH: 31.1 pg (ref 26.0–34.0)
MCHC: 33.9 g/dL (ref 30.0–36.0)
MCV: 91.9 fL (ref 80.0–100.0)
Monocytes Absolute: 0.6 10*3/uL (ref 0.1–1.0)
Monocytes Relative: 9 %
Neutro Abs: 5.1 10*3/uL (ref 1.7–7.7)
Neutrophils Relative %: 74 %
Platelets: 298 10*3/uL (ref 150–400)
RBC: 4.08 MIL/uL (ref 3.87–5.11)
RDW: 12.9 % (ref 11.5–15.5)
WBC: 6.9 10*3/uL (ref 4.0–10.5)
nRBC: 0 % (ref 0.0–0.2)

## 2020-09-26 LAB — COMPREHENSIVE METABOLIC PANEL
ALT: 25 U/L (ref 0–44)
AST: 28 U/L (ref 15–41)
Albumin: 4.5 g/dL (ref 3.5–5.0)
Alkaline Phosphatase: 63 U/L (ref 38–126)
Anion gap: 12 (ref 5–15)
BUN: 11 mg/dL (ref 8–23)
CO2: 24 mmol/L (ref 22–32)
Calcium: 9.6 mg/dL (ref 8.9–10.3)
Chloride: 94 mmol/L — ABNORMAL LOW (ref 98–111)
Creatinine, Ser: 0.8 mg/dL (ref 0.44–1.00)
GFR, Estimated: >60 mL/min (ref >60)
Glucose, Bld: 107 mg/dL — ABNORMAL HIGH (ref 70–99)
Potassium: 4.2 mmol/L (ref 3.5–5.1)
Sodium: 130 mmol/L — ABNORMAL LOW (ref 135–145)
Total Bilirubin: 0.6 mg/dL (ref 0.3–1.2)
Total Protein: 7.5 g/dL (ref 6.5–8.1)

## 2020-09-26 LAB — ETHANOL: Alcohol, Ethyl (B): <10 mg/dL (ref <10)

## 2020-09-26 NOTE — ED Notes (Signed)
Tried to draw lab on left ac and right ac without success. Called phlebotomy to draw the labs.

## 2020-09-26 NOTE — ED Notes (Signed)
Pt is on the toilet and will not/can not get up because of having a "constant soft BM".  This Probation officer demonstrated how to use the call bell in the bathroom by activating it with her with instructions to use it when she is ready to get up from the toilet.

## 2020-09-26 NOTE — BH Assessment (Signed)
Tele Assessment Note   Patient Name: Debbie Wells MRN: [id number] Referring Physician: Dr. Dewaine Conger Location of Patient: [facility] Location of Provider: [place] is an 79 y.o. female.  -Clinician reviewed note by Dr. Dewaine Conger.  Patient was taken to behavioral health due to anxiety poor sleep poor concentration anhedonia.  Denies suicidal homicidal ideation, denies concerns for psychotic features or manic features.   Patient's husband was present during assessment.  Pt was asked if she wanted husband to leave during assessment, she elected to have him present.  Patient says that she has had a lot of medical issues lately that have affected her mood and anxiety.  She says that she is depressed.  She has medication to help her sleep but feels that she sleeps less than 8 hours.  Patient also does not feel like getting out of bed sometimes and she is tearful at times.  She worries about status of health and her medications and their interactions.    Patient denies any suicidal ideations and has no past hx of attempts.  No plan or intention to harm herself.  Patient denies any HI or A/V hallucinations.  No hx of substance abuse.  Patient is quiet and soft spoken.  She said she has some hearing loss.  Her eye contact is poor and she is oriented x3.  Patient does not show evidence of responding to internal stimuli nor delusional thought process.  Patient thought process is clear and coherent, and goal oriented.  Patient does appear to be anxious.  She reports poor appetite being poor with losing 20 pounds over last 6 months.  Sleep is medicated and not always 8 hours worth.  Pt is interested in talking with a counselor if referral information can be given.  Pt denies any past inpatient or outpatient psychiatric care.  -Clinician discussed patient care with Trinna Post, PA who did not recommend inpatient psychiatric care.  He did recommend outpatient referrals.   Clinician informed Dr. Ron Parker.  He said that was fine.  Clinician faxed over referral information for therapists/counselors in our area.  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 Adenomatous colon polyp   . Adenomatous polyp of colon 12/03/2004   9mm  . Anxiety   . Chronic headaches   . Chronic insomnia   . Depression   . Diverticulosis   . External hemorrhoids   . Hyperlipidemia   . Hypertension   . Hyperthyroidism   . Hypothyroidism   . Umbilical hernia     Past Surgical History:  Procedure Laterality Date  . ABDOMINAL HYSTERECTOMY  1975  . APPENDECTOMY  1975  . COLONOSCOPY  03/03/2010,12/03/2004  . ESOPHAGOGASTRODUODENOSCOPY  12/16/11   Dr. Silvano Rusk  . OOPHORECTOMY    . spinal lipoma removed  2006  . TOTAL ABDOMINAL HYSTERECTOMY      Family History:  Family History  Problem Relation Age of Onset  . Esophageal cancer Father   . Stomach cancer Father        mets from esophagus  . Heart disease Mother   . Irritable bowel syndrome Mother   . Kidney disease Mother   . Other Mother        cushings disease  . Addison's disease Mother   . Heart failure Mother   . Stroke Mother   . Hypertension Mother   . Stroke Maternal Grandmother   . Hypertension  Maternal Grandmother   . Colon cancer Neg Hx   . Heart attack Neg Hx     Social History:  reports that she quit smoking about 27 years ago. Her smoking use included cigarettes. She has never used smokeless tobacco. She reports current alcohol use of about 2.0 standard drinks of alcohol per week. She reports that she does not use drugs.  Additional Social History:  Alcohol / Drug Use Pain Medications: See PTA medication list Prescriptions: See PTA medication list Over the Counter: See PTA medication list History of alcohol / drug use?: No history of alcohol / drug abuse  CIWA: CIWA-Ar BP: 135/78 Pulse Rate: 97 COWS:    Allergies:  Allergies   Allergen Reactions  . Iodinated Diagnostic Agents Hives    Per patient she has developed hives and itching in upper trunk of body after last 2 CT scans once she got homes that she has just treated at home with benadryl. She has not told anyone until now. States she was able to bear it   . Benazepril Cough  . Hydrocodone-Acetaminophen     Vicodin=REACTION: rash  . Sulfonamide Derivatives     REACTION: rash    Home Medications: (Not in a hospital admission)   OB/GYN Status:  No LMP recorded. Patient has had a hysterectomy.  General Assessment Data Location of Assessment: [facility] TTS Assessment: In system Is this a Tele or Face-to-Face Assessment?: Tele Assessment Is this an Initial Assessment or a Re-assessment for this encounter?: Initial Assessment Patient Accompanied by:: Other (Husband Osvaldo Angst) Language Other than English: No Living Arrangements: Other (Comment) (Lives with husband Gwyndolyn Saxon.) What gender do you identify as?: Female Date Telepsych consult ordered in [facility]: 09/26/20 Time Telepsych consult ordered in [facility]: 1853 Marital status: Married Pregnancy Status: No Living Arrangements: Spouse/significant other Can pt return to current living arrangement?: Yes Admission Status: Voluntary Is patient capable of signing voluntary admission?: Yes Referral Source: Self/Family/Friend Insurance type: [facility] Living Arrangements: Spouse/significant other Name of Psychiatrist: None Name of Therapist: None  Education Status Is patient currently in school?: No Is the patient employed, unemployed or receiving disability?:  (Retired)  Risk to self with the past 6 months Suicidal Ideation: No Has patient been a risk to self within the past 6 months prior to admission? : No Suicidal Intent: No Has patient had any suicidal intent within the past 6 months prior to admission? : No Is patient at risk for suicide?: No Suicidal Plan?: No Has patient had  any suicidal plan within the past 6 months prior to admission? : No Access to Means: No What has been your use of drugs/alcohol within the last 12 months?: N/A Previous Attempts/Gestures: No How many times?: 0 Other Self Harm Risks: None Triggers for Past Attempts: None known Intentional Self Injurious Behavior: None Family Suicide History: No Recent stressful life event(s): Recent negative physical changes Persecutory voices/beliefs?: No Depression: Yes Depression Symptoms: Despondent, Fatigue, Loss of interest in usual pleasures, Feeling worthless/self pity, Tearfulness Substance abuse history and/or treatment for substance abuse?: No Suicide prevention information given to non-admitted patients: Not applicable  Risk to Others within the past 6 months Homicidal Ideation: No Does patient have any lifetime risk of violence toward others beyond the six months prior to admission? : No Thoughts of Harm to Others: No Current Homicidal Intent: No Current Homicidal Plan: No Access to Homicidal Means: No Identified Victim: No one History of harm to others?: No Assessment  of Violence: None Noted Violent Behavior Description: None reported Does patient have access to weapons?: No Criminal Charges Pending?: No Does patient have a court date: No Is patient on probation?: No  Psychosis Hallucinations: None noted Delusions: None noted  Mental Status Report Appearance/Hygiene: Disheveled Eye Contact: Poor Motor Activity: Freedom of movement Speech: Logical/coherent Level of Consciousness: Alert Mood: Anxious, Sad Affect: Anxious, Sad Anxiety Level: Moderate Thought Processes: Coherent, Relevant Judgement: Unimpaired Orientation: Person, Place, Situation, Time Obsessive Compulsive Thoughts/Behaviors: None  Cognitive Functioning Concentration: Decreased Memory: Recent Impaired, Remote Intact Is patient IDD: No Insight: Fair Impulse Control: Fair Appetite: Poor Have you had  any weight changes? : Loss Amount of the weight change? (lbs):  (20lbs in last 6 months) Sleep: No Change Total Hours of Sleep:  (Needs meds to help sleep.  Not a full 8 hours.) Vegetative Symptoms: Staying in bed ("Sometimes")  ADLScreening [facility] Assessment Services) Patient's cognitive ability adequate to safely complete daily activities?: Yes Patient able to express need for assistance with ADLs?: Yes Independently performs ADLs?: Yes (appropriate for developmental age)  Prior Inpatient Therapy Prior Inpatient Therapy: No  Prior Outpatient Therapy Prior Outpatient Therapy: No Does patient have an ACCT team?: No Does patient have Intensive In-House Services?  : No Does patient have [facility] services? : No Does patient have P4CC services?: No  ADL Screening (condition at time of admission) Patient's cognitive ability adequate to safely complete daily activities?: Yes Is the patient deaf or have difficulty hearing?: Yes (Some difficulty hearing.) Does the patient have difficulty seeing, even when wearing glasses/contacts?: Yes (Uses reading glasses.) Does the patient have difficulty concentrating, remembering, or making decisions?: Yes Patient able to express need for assistance with ADLs?: Yes Does the patient have difficulty dressing or bathing?: No (Is taking more time to complete.) Independently performs ADLs?: Yes (appropriate for developmental age) Does the patient have difficulty walking or climbing stairs?: No Weakness of Legs: Both Weakness of Arms/Hands: Both (Shakiness in both hands.)       Abuse/Neglect Assessment (Assessment to be complete while patient is alone) Abuse/Neglect Assessment Can Be Completed: Yes Physical Abuse: Denies Verbal Abuse: Denies Sexual Abuse: Denies Exploitation of patient/patient's resources: Denies Self-Neglect: Denies                Disposition:  Disposition Initial Assessment Completed for this Encounter: Yes  This service  was provided via telemedicine using a 2-way, interactive audio and video technology.  Names of all persons participating in this telemedicine service and their role in this encounter. Name: Debbie Wells Role: patient  Name: Osvaldo Angst Role: husband  Name: Curlene Dolphin, M.S. LCAS QP Role: clinician  Name:  Role:     Raymondo Band 09/26/2020 8:03 PM

## 2020-09-26 NOTE — ED Provider Notes (Signed)
[place] DEPT Provider Note   CSN: [id number] Arrival date & time: 09/26/20  1521      History Chief Complaint  Patient presents with  . Fall    depressed    Debbie Wells is a 79 y.o. female.  Patient was taken to behavioral health due to anxiety poor sleep poor concentration anhedonia.  Denies suicidal homicidal ideation, denies concerns for psychotic features or manic features.  Has been in poor health lately having urinary retention requiring Foley catheter placement.  And today developing diarrhea.  Nonbloody nonmelanic diarrhea.  Denies fevers chills sick contacts denies abdominal pain denies nausea vomiting.  Able to tolerate p.o.        Past Medical History:  Diagnosis Date  . Adenomatous colon polyp   . Adenomatous polyp of colon 12/03/2004   55mm  . Anxiety   . Chronic headaches   . Chronic insomnia   . Depression   . Diverticulosis   . External hemorrhoids   . Hyperlipidemia   . Hypertension   . Hyperthyroidism   . Hypothyroidism   . Umbilical hernia     Patient Active Problem List   Diagnosis Date Noted  . Chronic insomnia 03/20/2020  . 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 Insomnia due to medical condition 10/12/2018  . Hyperthyroidism determined by thyroid function test 10/12/2018  . Hypertension due to endocrine disorder 10/12/2018  . Tremor observed on examination 10/12/2018  . Accelerated hypertension 09/27/2018  . Anxiety about health 09/27/2018  . Chronic constipation 06/19/2018  . Essential hypertension 02/16/2016  . IBS (irritable bowel syndrome) 03/12/2014  . Personal history of colonic adenoma 02/12/2013  . LUQ abdominal pain 01/23/2013  . Chest pain 12/05/2012  . Benign hypertensive heart disease without heart failure 12/05/2012  . Pure hypercholesterolemia 12/05/2012  . Umbilical hernia 53/66/4403  . Chronic  upper abdominal pain 06/24/2012  . HEMORRHOIDS 07/22/2008     Past Surgical History:  Procedure Laterality Date  . ABDOMINAL HYSTERECTOMY  1975  . APPENDECTOMY  1975  . COLONOSCOPY  03/03/2010,12/03/2004  . ESOPHAGOGASTRODUODENOSCOPY  12/16/11   Dr. Silvano Rusk  . OOPHORECTOMY    . spinal lipoma removed  2006  . TOTAL ABDOMINAL HYSTERECTOMY       OB History   No obstetric history on file.     Family History  Problem Relation Age of Onset  . Esophageal cancer Father   . Stomach cancer Father        mets from esophagus  . Heart disease Mother   . Irritable bowel syndrome Mother   . Kidney disease Mother   . Other Mother        cushings disease  . Addison's disease Mother   . Heart failure Mother   . Stroke Mother   . Hypertension Mother   . Stroke Maternal Grandmother   . Hypertension Maternal Grandmother   . Colon cancer Neg Hx   . Heart attack Neg Hx     Social History   Tobacco Use  . Smoking status: Former Smoker    Types: Cigarettes    Quit date: 01/23/1993    Years since quitting: 27.6  . Smokeless tobacco: Never Used  Vaping Use  . Vaping Use: Never used  Substance Use Topics  . Alcohol use: Yes    Alcohol/week: 2.0 standard drinks    Types: 2 Glasses of wine per week  . Drug use: No    Home Medications Prior to Admission medications  Medication Sig Start Date End Date Taking? Authorizing Provider  ALPRAZolam Duanne Moron) 0.5 MG tablet Take 0.25-0.5 mg by mouth 3 (three) times daily as needed for anxiety or sleep. Pt takes a full tab at night and whole a tab during day    [provider]  amLODipine (NORVASC) 5 MG tablet TAKE 1 TABLET BY MOUTH TWICE A DAY Patient taking differently: Take 5 mg by mouth daily.  04/29/20   Lelon Perla, MD  aspirin 81 MG tablet Take 81 mg by mouth daily.     [provider]  bacitracin ophthalmic ointment Place 1 application into both eyes daily as needed for wound care. Dry eyes 10/01/15   [provider]  cephALEXin (KEFLEX) 250 MG capsule Take 250 mg  by mouth daily.  04/09/20   [provider]  escitalopram (LEXAPRO) 10 MG tablet Take 10 mg by mouth daily. 08/04/20   [provider]  estrogens, conjugated, (PREMARIN) 0.45 MG tablet Take 0.45 mg by mouth daily.     [provider]  gabapentin (NEURONTIN) 100 MG capsule Pt may take 1 capsule in the morning as needed and Always 1 capsule at bedtime Patient taking differently: Take 100 mg by mouth 3 (three) times daily.  06/26/20   Dohmeier, Asencion Partridge, MD  losartan (COZAAR) 50 MG tablet TAKE 2 TABLETS (100 MG TOTAL) BY MOUTH DAILY. KEEP UPCOMING APPOINTMENT FOR REFILLS Patient taking differently: Take 50 mg by mouth in the morning and at bedtime.  03/26/20   Lelon Perla, MD  Meth-Hyo-M Bl-Na Phos-Ph Sal (URIBEL) 118 MG CAPS Take 1 capsule by mouth daily as needed (For urine).  09/19/20   [provider]  metoprolol succinate (TOPROL-XL) 25 MG 24 hr tablet TAKE 1/2 TABLET BY MOUTH EVERY DAY Patient taking differently: Take 12.5 mg by mouth daily.  09/12/20   Lelon Perla, MD  Multiple Vitamin (MULTIVITAMIN) capsule Take 1 capsule by mouth daily.    [provider]  Omega-3 Fatty Acids (FISH OIL PO) Take 1 tablet by mouth daily.    [provider]  polyethylene glycol (MIRALAX / GLYCOLAX) packet Take 17 g by mouth daily as needed for mild constipation.     [provider]  predniSONE (DELTASONE) 50 MG tablet CT pre-meds Take one tablet by mouth at 4AM on 08/28/2020, then take one tablet by mouth at 10:00am on 08/28/2020, then take one tablet by mouth at 4:00pm on 08/28/2020 08/21/20   Gatha Mayer, MD  senna-docusate (SENOKOT-S) 8.6-50 MG tablet Take 1 tablet by mouth at bedtime. 09/10/20   Lucrezia Starch, MD  SYNTHROID 75 MCG tablet Take 75 mcg by mouth daily.  03/15/18   [provider]  traZODone (DESYREL) 100 MG tablet Take 150 mg by mouth at bedtime.  11/12/14   [provider]    Allergies    Iodinated  diagnostic agents, Benazepril, Hydrocodone-acetaminophen, and Sulfonamide derivatives  Review of Systems   Review of Systems  Constitutional: Negative for chills and fever.  HENT: Negative for congestion and rhinorrhea.   Respiratory: Negative for cough and shortness of breath.   Cardiovascular: Negative for chest pain and palpitations.  Gastrointestinal: Positive for diarrhea. Negative for nausea and vomiting.  Genitourinary: Negative for difficulty urinating and dysuria.  Musculoskeletal: Negative for arthralgias and back pain.  Skin: Negative for rash and wound.  Neurological: Negative for light-headedness and headaches.  Psychiatric/Behavioral: Positive for decreased concentration, dysphoric mood and sleep disturbance. Negative for hallucinations, self-injury and suicidal  ideas. The patient is nervous/anxious. The patient is not hyperactive.     Physical Exam Updated Vital Signs BP 135/78 (BP Location: Right Arm)   Pulse 97   Temp 97.7 F (36.5 C) (Oral)   Resp 14   SpO2 96%   Physical Exam Vitals and nursing note reviewed. Exam conducted with a chaperone present.  Constitutional:      General: She is not in acute distress.    Appearance: Normal appearance.  HENT:     Head: Normocephalic and atraumatic.     Nose: No rhinorrhea.  Eyes:     General:        Right eye: No discharge.        Left eye: No discharge.     Conjunctiva/sclera: Conjunctivae normal.  Cardiovascular:     Rate and Rhythm: Normal rate and regular rhythm.  Pulmonary:     Effort: Pulmonary effort is normal. No respiratory distress.     Breath sounds: No stridor.  Abdominal:     General: Abdomen is flat. There is no distension.     Palpations: Abdomen is soft.  Musculoskeletal:        General: No tenderness or signs of injury.       Arms:  Skin:    General: Skin is warm and dry.  Neurological:     General: No focal deficit present.     Mental Status: She is alert. Mental status is at baseline.       Motor: No weakness.  Psychiatric:        Attention and Perception: Attention normal.        Mood and Affect: Mood is anxious.        Speech: Speech normal.        Behavior: Behavior normal. Behavior is cooperative.        Thought Content: Thought content normal. Thought content is not paranoid or delusional. Thought content does not include homicidal or suicidal ideation. Thought content does not include homicidal or suicidal plan.        Judgment: Judgment normal.     ED Results / Procedures / Treatments   Labs (all labs ordered are listed, but only abnormal results are displayed) Labs Reviewed  COMPREHENSIVE METABOLIC PANEL - Abnormal; Notable for the following components:      Result Value   Sodium 130 (*)    Chloride 94 (*)    Glucose, Bld 107 (*)    All other components within normal limits  RAPID URINE DRUG SCREEN, HOSP PERFORMED - Abnormal; Notable for the following components:   Benzodiazepines POSITIVE (*)    All other components within normal limits  RESP PANEL BY RT-PCR (FLU A&B, COVID) ARPGX2  ETHANOL  CBC WITH DIFFERENTIAL/PLATELET    EKG EKG Interpretation  Date/Time:  Friday September 26 2020 17:05:59 EST Ventricular Rate:  77 PR Interval:  184 QRS Duration: 78 QT Interval:  376 QTC Calculation: 425 R Axis:   88 Text Interpretation: Normal sinus rhythm Lateral infarct , age undetermined Abnormal ECG Confirmed by Dewaine Conger [phone]) on 09/26/2020 6:52:14 PM   Radiology No results found.  Procedures Procedures (including critical care time)  Medications Ordered in ED Medications - No data to display  ED Course  I have reviewed the triage vital signs and the nursing notes.  Pertinent labs & imaging results that were available during my care of the patient were reviewed by me and considered in my medical decision making (see chart for details).  MDM Rules/Calculators/A&P                          Brought to the emergency room for medical  clearance.  Chronic indwelling Foley catheter secondary to urinary retention.  We will get urine studies.  Fall today as she was getting of bed tripped on her Foley catheter.  No imaging needed.  We will get screening laboratory studies for medical clearance for behavioral health assessment.  Denies any acute, psychiatric features at this time.  But will get TTS consult.  Patient does not need involuntary commitment at this time.  Patient's labs were reviewed by me and shows the EKG, no acute ischemic change interval abnormality or arrhythmia.  Labs show only mild hyponatremia, appears to be chronic compared to prior.  At this point patient is medically cleared still waiting for screening viral panel. TTS pending   [facility] providers do not see the need for inpatient treatment.  I agree.  This patient will need outpatient resources.  Laboratory studies unremarkable safe for discharge home   Final Clinical Impression(s) / ED Diagnoses Final diagnoses:  Fall, initial encounter  Diarrhea, unspecified type    Rx / DC Orders ED Discharge Orders    None       Breck Coons, MD 09/26/20 2051

## 2020-09-26 NOTE — ED Triage Notes (Signed)
Pt was taken to [facility] by her family because they wanted her to have a psychiatric evaluation. She has a temporary indwelling urinary catheter (unknown reason). Urine is clear,yellow. She is sitting on the toilet at this time and does not feel that she can get up because she is having a chronic, soft BM today (per pt).  She appears very anxious. She is frail appearing. States that she is not sleeping or eating well and she fell last night. Although she fell on her right side, she is having pain around her left ribs. Both arms hurt. She appears to have not difficulty moving them.

## 2020-09-26 NOTE — ED Notes (Signed)
EKG completed and given to Dr. Ron Parker.

## 2020-09-26 NOTE — ED Notes (Signed)
Pt and husband verbalized understanding of discharge instructions; no signature pad was available for signature.

## 2020-09-26 NOTE — ED Notes (Signed)
Pt's husband reports that pt has chronic constipation but today she reports that she is going constantly. It does not appear to be ongoing but anxiety.

## 2020-09-26 NOTE — ED Notes (Signed)
Pt's husband informed this Probation officer that pt has home medications in her bag and wanted to know if she could have them now. This Probation officer told him that the doctor would be ordering medications for her if indicated and would start home medications if she stays the night.  This Probation officer informed night nurse Wells Guiles that the home medications are in pt's locker and that they should be returned if pt leaves or sent to pharmacy if she is kept overnight.

## 2020-09-26 NOTE — ED Notes (Signed)
Pt had bm that was soft formed and 933ml of urine emptied from catheter

## 2020-09-30 ENCOUNTER — Telehealth: Payer: Self-pay | Admitting: Physician Assistant

## 2020-09-30 NOTE — Telephone Encounter (Signed)
Paged by answering service.  Spoke with daughter.  Patient recently dealing with dementia and bladder issue.  During urology appointment noted ankle edema which seems new issue.  Her blood pressure was 100/60 at office visit.  Patient thinks that she might had took accident extra antihypertensive overnight but no clear history.  During my evaluation her blood pressure was 142/90.  I have advised daughter and her husband to keep current dose of amlodipine 5 mg daily, losartan 50 mg twice daily and Toprol-XL daily.  Keep log of blood pressure.  Elevate leg and cut back salt.  If ongoing issue, daughter will call us for further recommendation.  She is appreciative of call.

## 2020-09-30 NOTE — Telephone Encounter (Signed)
Debbie Wells [phone]

## 2020-09-30 NOTE — Telephone Encounter (Signed)
Which phone number is the son to be reached at ?

## 2020-10-01 NOTE — Telephone Encounter (Signed)
I spoke to Debbie Wells's son today. He reports his mother is highly anxious, frantic, and cannot be calmed down.  Does not sleep, does not eat- over fixated on symptoms.  I suggested to present her to the ER and ask for a refderral to [facility] geronto-psychiatry.  He answered his parents had just this last Saturday been in the local  ED but after his mom was asked if she would hurt herself or others  and she was send home (upon answering "no").  I suggested respite care in a nursing facility to give not just his mother the attention she needs but also to give his father some rest.   I will share this note with her Internist,Dr. Gaston .  FYI to my nurse, CB

## 2020-10-01 NOTE — Progress Notes (Deleted)
HPI: FU hypertension. She has a history of hypertension which has been somewhat difficult to control.She had previously worn an outpatient 24-hour blood pressure monitor which showed wide swings of blood pressure.She has had a 24-hour urine collection for metanephrines which was normal. She has had blood work in Dr. Jerilynn Som which has ruled out Cushing's syndrome.Her cardiac workup has included a Lexiscan Myoview stress test on 12/07/12 showing no evidence of ischemia and her ejection fraction was 60% and there were no wall motion abnormalities. She had a previous echocardiogram 09/17/11 showing normal LV function and mild mitral regurgitation. Renal dopplers 2014 showed mild 1-59 right RAS. She has had problems with chronic steady pain in the left epigastrium and left upper quadrant and has seen her gastroenterologist Dr. Carlean Purl. She has seen Dr. Mamie Nick regarding question of mesenteric and or celiac artery stenosis as a possible cause of her left upper quadrant pain. Dr. Kellie Simmering did not feel that the quality of her pain suggested mesenteric ischemia. She has also been seen by vascular surgery at [facility]. They did not feel that she would benefit from surgery. Previous cough with ACEI. Bradycardia with higher doses of beta blocker. Patient had lifeline screening previously and her carotids were normal, no aneurysm and normal ABIs.Since last seen,  Current Outpatient Medications  Medication Sig Dispense Refill  . ALPRAZolam (XANAX) 0.5 MG tablet Take 0.25-0.5 mg by mouth 3 (three) times daily as needed for anxiety or sleep. Pt takes a full tab at night and whole a tab during day    . amLODipine (NORVASC) 5 MG tablet TAKE 1 TABLET BY MOUTH TWICE A DAY (Patient taking differently: Take 5 mg by mouth daily. ) 180 tablet 1  . aspirin 81 MG tablet Take 81 mg by mouth daily.     . bacitracin ophthalmic ointment Place 1 application into both eyes daily as needed for wound care. Dry  eyes  0  . cephALEXin (KEFLEX) 250 MG capsule Take 250 mg by mouth daily.     Marland Kitchen escitalopram (LEXAPRO) 10 MG tablet Take 10 mg by mouth daily.    Marland Kitchen estrogens, conjugated, (PREMARIN) 0.45 MG tablet Take 0.45 mg by mouth daily.     Marland Kitchen gabapentin (NEURONTIN) 100 MG capsule Pt may take 1 capsule in the morning as needed and Always 1 capsule at bedtime (Patient taking differently: Take 100 mg by mouth 3 (three) times daily. ) 180 capsule 1  . losartan (COZAAR) 50 MG tablet TAKE 2 TABLETS (100 MG TOTAL) BY MOUTH DAILY. KEEP UPCOMING APPOINTMENT FOR REFILLS (Patient taking differently: Take 50 mg by mouth in the morning and at bedtime. ) 180 tablet 2  . Meth-Hyo-M Bl-Na Phos-Ph Sal (URIBEL) 118 MG CAPS Take 1 capsule by mouth daily as needed (For urine).     . metoprolol succinate (TOPROL-XL) 25 MG 24 hr tablet TAKE 1/2 TABLET BY MOUTH EVERY DAY (Patient taking differently: Take 12.5 mg by mouth daily. ) 45 tablet 3  . Multiple Vitamin (MULTIVITAMIN) capsule Take 1 capsule by mouth daily.    . Omega-3 Fatty Acids (FISH OIL PO) Take 1 tablet by mouth daily.    . polyethylene glycol (MIRALAX / GLYCOLAX) packet Take 17 g by mouth daily as needed for mild constipation.     . predniSONE (DELTASONE) 50 MG tablet CT pre-meds Take one tablet by mouth at 4AM on 08/28/2020, then take one tablet by mouth at 10:00am on 08/28/2020, then take one tablet by mouth at  4:00pm on 08/28/2020 3 tablet 0  . senna-docusate (SENOKOT-S) 8.6-50 MG tablet Take 1 tablet by mouth at bedtime. 30 tablet 0  . SYNTHROID 75 MCG tablet Take 75 mcg by mouth daily.   1  . traZODone (DESYREL) 100 MG tablet Take 150 mg by mouth at bedtime.   0   No current facility-administered medications for this visit.     Past Medical History:  Diagnosis Date  . Adenomatous colon polyp   . Adenomatous polyp of colon 12/03/2004   58mm  . Anxiety   . Chronic headaches   . Chronic insomnia   . Depression   . Diverticulosis   . External hemorrhoids    . Hyperlipidemia   . Hypertension   . Hyperthyroidism   . Hypothyroidism   . Umbilical hernia     Past Surgical History:  Procedure Laterality Date  . ABDOMINAL HYSTERECTOMY  1975  . APPENDECTOMY  1975  . COLONOSCOPY  03/03/2010,12/03/2004  . ESOPHAGOGASTRODUODENOSCOPY  12/16/11   Dr. Silvano Rusk  . OOPHORECTOMY    . spinal lipoma removed  2006  . TOTAL ABDOMINAL HYSTERECTOMY      Social History   Socioeconomic History  . Marital status: Married    Spouse name: Not on file  . Number of children: 2  . Years of education: Not on file  . Highest education level: Not on file  Occupational History  . Occupation: retired  Tobacco Use  . Smoking status: Former Smoker    Types: Cigarettes    Quit date: 01/23/1993    Years since quitting: 27.7  . Smokeless tobacco: Never Used  Vaping Use  . Vaping Use: Never used  Substance and Sexual Activity  . Alcohol use: Yes    Alcohol/week: 2.0 standard drinks    Types: 2 Glasses of wine per week  . Drug use: No  . Sexual activity: Not on file  Other Topics Concern  . Not on file  Social History Narrative   Patient is married she is retired and has 2 children   About 2 alcoholic beverages a week no drugs, no current tobacco she is a former smoker      Lives at home with her husband   Right handed   Social Determinants of Health   Financial Resource Strain:   . Difficulty of Paying Living Expenses: Not on file  Food Insecurity:   . Worried About Charity fundraiser in the Last Year: Not on file  . Ran Out of Food in the Last Year: Not on file  Transportation Needs:   . Lack of Transportation (Medical): Not on file  . Lack of Transportation (Non-Medical): Not on file  Physical Activity:   . Days of Exercise per Week: Not on file  . Minutes of Exercise per Session: Not on file  Stress:   . Feeling of Stress : Not on file  Social Connections:   . Frequency of Communication with Friends and Family: Not on file  . Frequency of  Social Gatherings with Friends and Family: Not on file  . Attends Religious Services: Not on file  . Active Member of Clubs or Organizations: Not on file  . Attends Archivist Meetings: Not on file  . Marital Status: Not on file  Intimate Partner Violence:   . Fear of Current or Ex-Partner: Not on file  . Emotionally Abused: Not on file  . Physically Abused: Not on file  . Sexually Abused: Not on file  Family History  Problem Relation Age of Onset  . Esophageal cancer Father   . Stomach cancer Father        mets from esophagus  . Heart disease Mother   . Irritable bowel syndrome Mother   . Kidney disease Mother   . Other Mother        cushings disease  . Addison's disease Mother   . Heart failure Mother   . Stroke Mother   . Hypertension Mother   . Stroke Maternal Grandmother   . Hypertension Maternal Grandmother   . Colon cancer Neg Hx   . Heart attack Neg Hx     ROS: no fevers or chills, productive cough, hemoptysis, dysphasia, odynophagia, melena, hematochezia, dysuria, hematuria, rash, seizure activity, orthopnea, PND, pedal edema, claudication. Remaining systems are negative.  Physical Exam: Well-developed well-nourished in no acute distress.  Skin is warm and dry.  HEENT is normal.  Neck is supple.  Chest is clear to auscultation with normal expansion.  Cardiovascular exam is regular rate and rhythm.  Abdominal exam nontender or distended. No masses palpated. Extremities show no edema. neuro grossly intact  ECG- personally reviewed  A/P  1 hypertension-patient's blood pressure is controlled.  Continue present medical regimen.  2 palpitations-continue beta-blocker.  Symptoms are recently well controlled.  3 hyperlipidemia-Per primary care.  4 chronic abdominal pain-followed by gastroenterology.  Kirk Ruths, MD

## 2020-10-03 DIAGNOSIS — F332 Major depressive disorder, recurrent severe without psychotic features: Secondary | ICD-10-CM | POA: Insufficient documentation

## 2020-10-03 DIAGNOSIS — E785 Hyperlipidemia, unspecified: Secondary | ICD-10-CM | POA: Insufficient documentation

## 2020-10-03 DIAGNOSIS — Z87440 Personal history of urinary (tract) infections: Secondary | ICD-10-CM | POA: Insufficient documentation

## 2020-10-03 DIAGNOSIS — R748 Abnormal levels of other serum enzymes: Secondary | ICD-10-CM | POA: Insufficient documentation

## 2020-10-03 DIAGNOSIS — R339 Retention of urine, unspecified: Secondary | ICD-10-CM | POA: Insufficient documentation

## 2020-10-13 ENCOUNTER — Ambulatory Visit: Payer: Medicare PPO | Admitting: Cardiology

## 2020-11-06 ENCOUNTER — Telehealth: Payer: Self-pay | Admitting: Internal Medicine

## 2020-11-06 NOTE — Telephone Encounter (Signed)
Pt's spouse is requesting a call back from a nurse to discuss the pt's continuous diarrhea (black stools) going on for  5 days, caller would like to further discuss with the nurse.

## 2020-11-07 NOTE — Telephone Encounter (Signed)
Left message for patient to call back  

## 2020-11-07 NOTE — Telephone Encounter (Signed)
Inbound call from patient's husband returning your call.  Requesting to be called at [phone] please.

## 2020-11-07 NOTE — Telephone Encounter (Signed)
Patients husband calling to follow up on previous message states her stool is now black and is seeking help

## 2020-11-10 NOTE — Telephone Encounter (Signed)
Patient is having black stools and diarrhea.  3-4 episodes a day.  She has been taking Pepto Bismol with no relief.  I reviewed with her husband that the Pepto Bismol will turn her stools black.  He is advised to have her take imodium for the diarrhea.  She will come in on Thursday 1/13 11:10 to see Dr. Carlean Purl.  Offered an earlier appointment, but patient not able to get here before lunch time.

## 2020-11-13 ENCOUNTER — Ambulatory Visit: Payer: Medicare PPO | Admitting: Internal Medicine

## 2020-11-13 NOTE — Telephone Encounter (Signed)
OK no charge ?

## 2020-11-13 NOTE — Telephone Encounter (Signed)
Pt's husband Gwyndolyn Saxon called to let you know that pt will not be able to come to her appt today at 11:00 am. He was very apologetic and acknowledge that you went above and beyond to accommodate pt. He stated that pt lives in an assisted living facility and he was inform that pt was not feeling well and will not make it to appt He will call back to r/s after he speaks with his wife.

## 2020-12-22 ENCOUNTER — Other Ambulatory Visit: Payer: Self-pay

## 2020-12-22 ENCOUNTER — Ambulatory Visit (INDEPENDENT_AMBULATORY_CARE_PROVIDER_SITE_OTHER): Payer: Medicare PPO | Admitting: Adult Health

## 2020-12-22 ENCOUNTER — Encounter: Payer: Self-pay | Admitting: Adult Health

## 2020-12-22 VITALS — BP 139/71 | HR 71 | Ht 64.5 in | Wt 146.0 lb

## 2020-12-22 DIAGNOSIS — F5104 Psychophysiologic insomnia: Secondary | ICD-10-CM

## 2020-12-22 DIAGNOSIS — G4700 Insomnia, unspecified: Secondary | ICD-10-CM

## 2020-12-22 DIAGNOSIS — F418 Other specified anxiety disorders: Secondary | ICD-10-CM | POA: Diagnosis not present

## 2020-12-22 DIAGNOSIS — F331 Major depressive disorder, recurrent, moderate: Secondary | ICD-10-CM

## 2020-12-22 DIAGNOSIS — I700 Atherosclerosis of aorta: Secondary | ICD-10-CM | POA: Insufficient documentation

## 2020-12-22 DIAGNOSIS — N390 Urinary tract infection, site not specified: Secondary | ICD-10-CM | POA: Insufficient documentation

## 2020-12-22 DIAGNOSIS — R4589 Other symptoms and signs involving emotional state: Secondary | ICD-10-CM

## 2020-12-22 DIAGNOSIS — R404 Transient alteration of awareness: Secondary | ICD-10-CM | POA: Insufficient documentation

## 2020-12-22 NOTE — Progress Notes (Signed)
[facility] MD/PA/NP Initial Note  12/22/2020 2:53 PM Debbie Wells  MRN:  [id number]  Chief Complaint:    HPI:  Describes mood today as "ok". Pleasant. Denies tearfulness. Mood symptoms - denies depression - "not really" - "wanting to feel normal and in control", anxiety - "still there and not leveled out", and denies irritability. 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 Stating "I'm doing so much better now". Stating "I wasn't doing well, but I got better in the hospital". Gets concerned about a "relapse". Stating "I'm trying to stay "optimistic". Feels like worsening symptoms came for "worrying about things too much". Stating "I was worried about old age and what we were going to do over the years". She and husband are now living together in a 2 bedroom apartment at [facility] [place]. Stable interest and motivation. Taking medications as prescribed.  Energy levels improved. Active, does not have a regular exercise routine.  Enjoys some usual interests and activities. Married. Lives with husband x 58 years. Has 2 children - son 76 in [place], and daughter in [place]. Spending time with family. Appetite increased. Weight loss 25 pounds - has gained the weight back since hospitalization.   Sleeps well most nights. Averages 8.5 hours. Focus and concentration "sporadic". Stating "I am forgetful". Gets startled when she thinks she's done things that she hasn't. More forgetful and difficult to stay on tasks. Completing tasks. Managing aspects of household.  Denies SI or HI.  Denies AH or VH.  Previous medication trials: Unknown  Visit Diagnosis:    ICD-10-CM   1. Anxiety about health  F41.8   2. Insomnia, unspecified type  G47.00   3. Major depressive disorder, recurrent episode, moderate (HCC)  F33.1   4. Chronic insomnia  F51.04     Past Psychiatric History: Recent admission to [facility] x 3 weeks. Past Medical History:  Past Medical History:   Diagnosis Date  . Adenomatous colon polyp   . Adenomatous polyp of colon 12/03/2004   34mm  . Anxiety   . Chronic headaches   . Chronic insomnia   . Depression   . Diverticulosis   . External hemorrhoids   . Hyperlipidemia   . Hypertension   . Hyperthyroidism   . Hypothyroidism   . Umbilical hernia     Past Surgical History:  Procedure Laterality Date  . ABDOMINAL HYSTERECTOMY  1975  . APPENDECTOMY  1975  . COLONOSCOPY  03/03/2010,12/03/2004  . ESOPHAGOGASTRODUODENOSCOPY  12/16/11   Dr. Silvano Rusk  . OOPHORECTOMY    . spinal lipoma removed  2006  . TOTAL ABDOMINAL HYSTERECTOMY      Family Psychiatric History: Denies any family history of mental illness.  Family History:  Family History  Problem Relation Age of Onset  . Esophageal cancer Father   . Stomach cancer Father        mets from esophagus  . Heart disease Mother   . Irritable bowel syndrome Mother   . Kidney disease Mother   . Other Mother        cushings disease  . Addison's disease Mother   . Heart failure Mother   . Stroke Mother   . Hypertension Mother   . Stroke Maternal Grandmother   . Hypertension Maternal Grandmother   . Colon cancer Neg Hx   . Heart attack Neg Hx     Social History:  Social History   Socioeconomic History  . Marital status: Married    Spouse name: Not on  file  . Number of children: 2  . Years of education: Not on file  . Highest education level: Not on file  Occupational History  . Occupation: retired  Tobacco Use  . Smoking status: Former Smoker    Types: Cigarettes    Quit date: 01/23/1993    Years since quitting: 27.9  . Smokeless tobacco: Never Used  Vaping Use  . Vaping Use: Never used  Substance and Sexual Activity  . Alcohol use: Yes    Alcohol/week: 2.0 standard drinks    Types: 2 Glasses of wine per week  . Drug use: No  . Sexual activity: Not on file  Other Topics Concern  . Not on file  Social History Narrative   Patient is married she is retired and  has 2 children   About 2 alcoholic beverages a week no drugs, no current tobacco she is a former smoker      Lives at home with her husband   Right handed   Social Determinants of Health   Financial Resource Strain: Not on file  Food Insecurity: Not on file  Transportation Needs: Not on file  Physical Activity: Not on file  Stress: Not on file  Social Connections: Not on file    Allergies:  Allergies  Allergen Reactions  . Iodinated Diagnostic Agents Hives    Per patient she has developed hives and itching in upper trunk of body after last 2 CT scans once she got homes that she has just treated at home with benadryl. She has not told anyone until now. States she was able to bear it   . Benazepril Cough  . Hydrocodone-Acetaminophen     Vicodin=REACTION: rash  . Sulfonamide Derivatives     REACTION: rash    Metabolic Disorder Labs: No results found for: HGBA1C, MPG No results found for: PROLACTIN No results found for: CHOL, TRIG, HDL, CHOLHDL, VLDL, LDLCALC Lab Results  Component Value Date   TSH 1.970 08/19/2020   TSH 1.710 04/30/2019    Therapeutic Level Labs: No results found for: LITHIUM No results found for: VALPROATE No components found for:  CBMZ  Current Medications: Current Outpatient Medications  Medication Sig Dispense Refill  . ALPRAZolam (XANAX) 0.5 MG tablet Take 0.25-0.5 mg by mouth 3 (three) times daily as needed for anxiety or sleep. Pt takes a full tab at night and whole a tab during day    . amLODipine (NORVASC) 5 MG tablet TAKE 1 TABLET BY MOUTH TWICE A DAY (Patient taking differently: Take 5 mg by mouth daily. ) 180 tablet 1  . aspirin 81 MG tablet Take 81 mg by mouth daily.     . bacitracin ophthalmic ointment Place 1 application into both eyes daily as needed for wound care. Dry eyes  0  . cephALEXin (KEFLEX) 250 MG capsule Take 250 mg by mouth daily.     Marland Kitchen escitalopram (LEXAPRO) 10 MG tablet Take 10 mg by mouth daily.    Marland Kitchen estrogens,  conjugated, (PREMARIN) 0.45 MG tablet Take 0.45 mg by mouth daily.     Marland Kitchen gabapentin (NEURONTIN) 100 MG capsule Pt may take 1 capsule in the morning as needed and Always 1 capsule at bedtime (Patient taking differently: Take 100 mg by mouth 3 (three) times daily. ) 180 capsule 1  . losartan (COZAAR) 50 MG tablet TAKE 2 TABLETS (100 MG TOTAL) BY MOUTH DAILY. KEEP UPCOMING APPOINTMENT FOR REFILLS (Patient taking differently: Take 50 mg by mouth in the morning and at bedtime. )  180 tablet 2  . Meth-Hyo-M Bl-Na Phos-Ph Sal (URIBEL) 118 MG CAPS Take 1 capsule by mouth daily as needed (For urine).     . metoprolol succinate (TOPROL-XL) 25 MG 24 hr tablet TAKE 1/2 TABLET BY MOUTH EVERY DAY (Patient taking differently: Take 12.5 mg by mouth daily. ) 45 tablet 3  . Multiple Vitamin (MULTIVITAMIN) capsule Take 1 capsule by mouth daily.    . Omega-3 Fatty Acids (FISH OIL PO) Take 1 tablet by mouth daily.    . polyethylene glycol (MIRALAX / GLYCOLAX) packet Take 17 g by mouth daily as needed for mild constipation.     . predniSONE (DELTASONE) 50 MG tablet CT pre-meds Take one tablet by mouth at 4AM on 08/28/2020, then take one tablet by mouth at 10:00am on 08/28/2020, then take one tablet by mouth at 4:00pm on 08/28/2020 3 tablet 0  . senna-docusate (SENOKOT-S) 8.6-50 MG tablet Take 1 tablet by mouth at bedtime. 30 tablet 0  . SYNTHROID 75 MCG tablet Take 75 mcg by mouth daily.   1  . traZODone (DESYREL) 100 MG tablet Take 150 mg by mouth at bedtime.   0   No current facility-administered medications for this visit.    Medication Side Effects: none  Orders placed this visit:  No orders of the defined types were placed in this encounter.   Psychiatric Specialty Exam:  Review of Systems  Musculoskeletal: Negative for gait problem.  Neurological: Negative for tremors.  Psychiatric/Behavioral:       Please refer to HPI    There were no vitals taken for this visit.There is no height or weight on file to  calculate BMI.  General Appearance: Neat and Well Groomed  Eye Contact:  Good  Speech:  Clear and Coherent and Normal Rate  Volume:  Normal  Mood:  Euthymic  Affect:  Appropriate and Congruent  Thought Process:  Coherent and Descriptions of Associations: Intact  Orientation:  Full (Time, Place, and Person)  Thought Content: Logical   Suicidal Thoughts:  No  Homicidal Thoughts:  No  Memory:  WNL  Judgement:  Good  Insight:  Good  Psychomotor Activity:  Normal  Concentration:  Concentration: Good  Recall:  Good  Fund of Knowledge: Good  Language: Good  Assets:  Communication Skills Desire for Improvement Financial Resources/Insurance Housing Intimacy Leisure Time Physical Health Resilience Social Support Talents/Skills Transportation Vocational/Educational  ADL's:  Intact  Cognition: WNL  Prognosis:  Good   Screenings:  Mini-Mental   Flowsheet Row Office Visit from 12/22/2020 in [facility]  Total Score (max 30 points ) 29    [place] [facility] from 09/26/2020 in [facility]  C-SSRS RISK CATEGORY No Risk      Receiving Psychotherapy: Yes   Treatment Plan/Recommendations:   Plan:  PDMP reviewed  1. Buspar 30mg   2. Zyprexa 2.5mg  BID 3. Canax 0.5mg  TID 4. Cymbalta 60mg  daily   MMSE 29/30  Read and reviewed note with patient for accuracy.   Recurrent UTI  Greater than 50% of face to face time with patient was spent on counseling and coordination of care. We discussed recent hospitalization and medication management.   RTC 3 weeks  Patient advised to contact office with any questions, adverse effects, or acute worsening in signs and symptoms.  Discussed potential benefits, risk, and side effects of benzodiazepines to include potential risk of tolerance and dependence, as well as possible drowsiness.  Advised patient not to drive if experiencing drowsiness and to  take lowest possible effective dose to  minimize risk of dependence and tolerance.  Discussed potential metabolic side effects associated with atypical antipsychotics, as well as potential risk for movement side effects. Advised pt to contact office if movement side effects occur.    Aloha Gell, NP

## 2021-01-15 ENCOUNTER — Other Ambulatory Visit: Payer: Self-pay

## 2021-01-15 ENCOUNTER — Ambulatory Visit (INDEPENDENT_AMBULATORY_CARE_PROVIDER_SITE_OTHER): Payer: Medicare PPO | Admitting: Adult Health

## 2021-01-15 ENCOUNTER — Encounter: Payer: Self-pay | Admitting: Adult Health

## 2021-01-15 DIAGNOSIS — F5104 Psychophysiologic insomnia: Secondary | ICD-10-CM

## 2021-01-15 DIAGNOSIS — G4700 Insomnia, unspecified: Secondary | ICD-10-CM

## 2021-01-15 DIAGNOSIS — F331 Major depressive disorder, recurrent, moderate: Secondary | ICD-10-CM

## 2021-01-15 NOTE — Progress Notes (Signed)
Debbie Wells [id number] 1941/05/14 80 y.o.  Subjective:   Patient ID:  Debbie Wells is a 80 y.o. (DOB January 11, 1941) female.  Chief Complaint: No chief complaint on file.   HPI   Debbie Wells presents to the office today for follow-up of GAD, MDD, and insomnia.  Describes mood today as "ok". Pleasant. Denies tearfulness. Mood symptoms - reports depression - "some". Stating "I would like to be more settled than I am". Feels anxious at times - has a lot of decisions to make about she and husband's living situation. Denies irritability. Struggling with staying awake after morning medications. No "excessive" worrying. Stating "I do feel better". She and husband are now living together in a 2 bedroom apartment at [facility]. Stable interest and motivation. Taking medications as prescribed.  Energy levels improved. Active, does not have a regular exercise routine.  Enjoys some usual interests and activities. Married. Lives with husband x 58 years. Has 2 children - son 33 in [place], and daughter in [place]. Spending time with family. Appetite increased. Weight gain - now stabilizing. Sleeps well most nights. Averages 8.5 hours. Focus and concentration improved. Forgetful at times. Still able to drive. Completing tasks. Managing aspects of household.  Denies SI or HI.  Denies AH or VH.  Previous medication trials: Unknown   Mini-Mental   Flowsheet Row Office Visit from 12/22/2020 in [facility]  Total Score (max 30 points ) 29    Flowsheet Row ED from 09/26/2020 in [facility] DEPT  C-SSRS RISK CATEGORY No Risk       Review of Systems:  Review of Systems  Musculoskeletal: Negative for gait problem.  Neurological: Negative for tremors.  Psychiatric/Behavioral:       Please refer to HPI    Medications: I have reviewed the patient's current medications.  Current Outpatient Medications  Medication Sig Dispense Refill  . ALPRAZolam  (XANAX) 0.5 MG tablet Take 0.25-0.5 mg by mouth 3 (three) times daily as needed for anxiety or sleep. Pt takes a full tab at night and whole a tab during day    . amLODipine (NORVASC) 5 MG tablet TAKE 1 TABLET BY MOUTH TWICE A DAY (Patient taking differently: Take 5 mg by mouth daily. ) 180 tablet 1  . aspirin 81 MG tablet Take 81 mg by mouth daily.     . bacitracin ophthalmic ointment Place 1 application into both eyes daily as needed for wound care. Dry eyes  0  . cephALEXin (KEFLEX) 250 MG capsule Take 250 mg by mouth daily.     Marland Kitchen escitalopram (LEXAPRO) 10 MG tablet Take 10 mg by mouth daily.    Marland Kitchen estrogens, conjugated, (PREMARIN) 0.45 MG tablet Take 0.45 mg by mouth daily.     Marland Kitchen gabapentin (NEURONTIN) 100 MG capsule Pt may take 1 capsule in the morning as needed and Always 1 capsule at bedtime (Patient taking differently: Take 100 mg by mouth 3 (three) times daily. ) 180 capsule 1  . losartan (COZAAR) 50 MG tablet TAKE 2 TABLETS (100 MG TOTAL) BY MOUTH DAILY. KEEP UPCOMING APPOINTMENT FOR REFILLS (Patient taking differently: Take 50 mg by mouth in the morning and at bedtime. ) 180 tablet 2  . Meth-Hyo-M Bl-Na Phos-Ph Sal (URIBEL) 118 MG CAPS Take 1 capsule by mouth daily as needed (For urine).     . metoprolol succinate (TOPROL-XL) 25 MG 24 hr tablet TAKE 1/2 TABLET BY MOUTH EVERY DAY (Patient taking differently: Take 12.5 mg by mouth  daily. ) 45 tablet 3  . Multiple Vitamin (MULTIVITAMIN) capsule Take 1 capsule by mouth daily.    . Omega-3 Fatty Acids (FISH OIL PO) Take 1 tablet by mouth daily.    . polyethylene glycol (MIRALAX / GLYCOLAX) packet Take 17 g by mouth daily as needed for mild constipation.     . predniSONE (DELTASONE) 50 MG tablet CT pre-meds Take one tablet by mouth at 4AM on 08/28/2020, then take one tablet by mouth at 10:00am on 08/28/2020, then take one tablet by mouth at 4:00pm on 08/28/2020 3 tablet 0  . senna-docusate (SENOKOT-S) 8.6-50 MG tablet Take 1 tablet by mouth at  bedtime. 30 tablet 0  . SYNTHROID 75 MCG tablet Take 75 mcg by mouth daily.   1  . traZODone (DESYREL) 100 MG tablet Take 150 mg by mouth at bedtime.   0   No current facility-administered medications for this visit.    Medication Side Effects: None  Allergies:  Allergies  Allergen Reactions  . Hydrocodone-Acetaminophen Rash    Vicodin=REACTION: rash  . Iodinated Diagnostic Agents Hives and Rash    Per patient she has developed hives and itching in upper trunk of body after last 2 CT scans once she got homes that she has just treated at home with benadryl. She has not told anyone until now. States she was able to bear it  Other reaction(s): Rash Per patient she has developed hives and itching in upper trunk of body after last 2 CT scans once she got homes that she has just treated at home with benadryl. She has not told anyone until now. States she was able to bear it   . Sulfa Antibiotics Rash    REACTION: rash Other reaction(s): Unknown  . Codeine     Other reaction(s): Unknown  . Hydrocodone-Acetaminophen     Vicodin=REACTION: rash  . Sulfonamide Derivatives     REACTION: rash  . Terbinafine     Other reaction(s): Unknown  . Benazepril Cough    Other reaction(s): Cough    Past Medical History:  Diagnosis Date  . Adenomatous colon polyp   . Adenomatous polyp of colon 12/03/2004   12mm  . Anxiety   . Chronic headaches   . Chronic insomnia   . Depression   . Diverticulosis   . External hemorrhoids   . Hyperlipidemia   . Hypertension   . Hyperthyroidism   . Hypothyroidism   . Umbilical hernia     Family History  Problem Relation Age of Onset  . Esophageal cancer Father   . Stomach cancer Father        mets from esophagus  . Heart disease Mother   . Irritable bowel syndrome Mother   . Kidney disease Mother   . Other Mother        cushings disease  . Addison's disease Mother   . Heart failure Mother   . Stroke Mother   . Hypertension Mother   . Stroke  Maternal Grandmother   . Hypertension Maternal Grandmother   . Colon cancer Neg Hx   . Heart attack Neg Hx     Social History   Socioeconomic History  . Marital status: Married    Spouse name: Not on file  . Number of children: 2  . Years of education: Not on file  . Highest education level: Not on file  Occupational History  . Occupation: retired  Tobacco Use  . Smoking status: Former Smoker    Types: Cigarettes  Quit date: 01/23/1993    Years since quitting: 27.9  . Smokeless tobacco: Never Used  Vaping Use  . Vaping Use: Never used  Substance and Sexual Activity  . Alcohol use: Yes    Alcohol/week: 2.0 standard drinks    Types: 2 Glasses of wine per week  . Drug use: No  . Sexual activity: Not on file  Other Topics Concern  . Not on file  Social History Narrative   Patient is married she is retired and has 2 children   About 2 alcoholic beverages a week no drugs, no current tobacco she is a former smoker      Lives at home with her husband   Right handed   Social Determinants of Health   Financial Resource Strain: Not on file  Food Insecurity: Not on file  Transportation Needs: Not on file  Physical Activity: Not on file  Stress: Not on file  Social Connections: Not on file  Intimate Partner Violence: Not on file    Past Medical History, Surgical history, Social history, and Family history were reviewed and updated as appropriate.   Please see review of systems for further details on the patient's review from today.   Objective:   Physical Exam:  There were no vitals taken for this visit.  Physical Exam Constitutional:      General: She is not in acute distress. Musculoskeletal:        General: No deformity.  Neurological:     Mental Status: She is alert and oriented to person, place, and time.     Coordination: Coordination normal.  Psychiatric:        Attention and Perception: Attention and perception normal. She does not perceive auditory or  visual hallucinations.        Mood and Affect: Mood normal. Mood is not anxious or depressed. Affect is not labile, blunt, angry or inappropriate.        Speech: Speech normal.        Behavior: Behavior normal.        Thought Content: Thought content normal. Thought content is not paranoid or delusional. Thought content does not include homicidal or suicidal ideation. Thought content does not include homicidal or suicidal plan.        Cognition and Memory: Cognition and memory normal.        Judgment: Judgment normal.     Comments: Insight intact     Lab Review:     Component Value Date/Time   NA 130 (L) 09/26/2020 1745   NA 142 12/28/2018 1515   K 4.2 09/26/2020 1745   CL 94 (L) 09/26/2020 1745   CO2 24 09/26/2020 1745   GLUCOSE 107 (H) 09/26/2020 1745   BUN 11 09/26/2020 1745   BUN 10 12/28/2018 1515   CREATININE 0.80 09/26/2020 1745   CALCIUM 9.6 09/26/2020 1745   PROT 7.5 09/26/2020 1745   PROT 7.0 12/28/2018 1515   ALBUMIN 4.5 09/26/2020 1745   ALBUMIN 4.8 (H) 12/28/2018 1515   AST 28 09/26/2020 1745   ALT 25 09/26/2020 1745   ALKPHOS 63 09/26/2020 1745   BILITOT 0.6 09/26/2020 1745   BILITOT 0.3 12/28/2018 1515   GFRNONAA >60 09/26/2020 1745   GFRAA 75 12/28/2018 1515       Component Value Date/Time   WBC 6.9 09/26/2020 1745   RBC 4.08 09/26/2020 1745   HGB 12.7 09/26/2020 1745   HCT 37.5 09/26/2020 1745   PLT 298 09/26/2020 1745   MCV 91.9  09/26/2020 1745   MCH 31.1 09/26/2020 1745   MCHC 33.9 09/26/2020 1745   RDW 12.9 09/26/2020 1745   LYMPHSABS 1.0 09/26/2020 1745   MONOABS 0.6 09/26/2020 1745   EOSABS 0.1 09/26/2020 1745   BASOSABS 0.0 09/26/2020 1745    No results found for: POCLITH, LITHIUM   No results found for: PHENYTOIN, PHENOBARB, VALPROATE, CBMZ   .res Assessment: Plan:    Plan:  PDMP reviewed  1. Buspar 30mg   2. Zyprexa 2.5mg  daily 3. Xanax 0.5mg  TID - decrease morning dose if too sedating 4. Cymbalta 60mg  daily   RTC 3  weeks  Patient advised to contact office with any questions, adverse effects, or acute worsening in signs and symptoms.  Discussed potential benefits, risk, and side effects of benzodiazepines to include potential risk of tolerance and dependence, as well as possible drowsiness.  Advised patient not to drive if experiencing drowsiness and to take lowest possible effective dose to minimize risk of dependence and tolerance.   Diagnoses and all orders for this visit:  Chronic insomnia  Major depressive disorder, recurrent episode, moderate (HCC)  Insomnia, unspecified type     Please see After Visit Summary for patient specific instructions.  Future Appointments  Date Time Provider [place]  02/16/2021  1:40 PM Newel Oien, Berdie Ogren, NP [facility] None  02/19/2021  1:30 PM Ward Givens, NP [facility] None    No orders of the defined types were placed in this encounter.   -------------------------------

## 2021-01-23 ENCOUNTER — Telehealth: Payer: Self-pay | Admitting: Adult Health

## 2021-01-23 NOTE — Telephone Encounter (Signed)
Lets have her reduce the evening dose of zyprexa to 1/2 tablet and have her call back next week with an update. If doing well, may discontinue. 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

## 2021-01-23 NOTE — Telephone Encounter (Signed)
If it does effect sleep, we can try another medication. She has gained some much needed weight since starting the Zyprexa, but is now getting concerned. Lets just try it over the weekend. I don't have any type of order form. Is there someone we can call at [place] to give a verbal since it is not controlled?

## 2021-01-23 NOTE — Telephone Encounter (Signed)
Spoke with [facility] and was transferred to [facility]. Receptionist advised to write an order and fax to [phone].  Instructed to make sure to discontinue Zyprexa then write the new order.    Order was faxed.    Follow up on Monday

## 2021-01-23 NOTE — Telephone Encounter (Signed)
Pt called and was checking on the zyprexa she is taking. She said that you were going to check if there was something like zyprexa that didn't cause weight gain she could take. If the answer is no that is ok she just wants to know. Please give her a call at [phone]

## 2021-01-23 NOTE — Telephone Encounter (Signed)
Rtc to pt and she reports an order will have to be faxed over or called. Pt lives in assisted living at [facility] and they give her the medication. She is concerned the decrease will affect her sleep but she will try it if that's what Barnett Applebaum prefers.

## 2021-01-23 NOTE — Telephone Encounter (Signed)
Was this Lybalvi or something else?

## 2021-02-16 ENCOUNTER — Encounter: Payer: Self-pay | Admitting: Adult Health

## 2021-02-16 ENCOUNTER — Other Ambulatory Visit: Payer: Self-pay

## 2021-02-16 ENCOUNTER — Ambulatory Visit (INDEPENDENT_AMBULATORY_CARE_PROVIDER_SITE_OTHER): Payer: Medicare PPO | Admitting: Adult Health

## 2021-02-16 DIAGNOSIS — G4700 Insomnia, unspecified: Secondary | ICD-10-CM

## 2021-02-16 DIAGNOSIS — F331 Major depressive disorder, recurrent, moderate: Secondary | ICD-10-CM | POA: Diagnosis not present

## 2021-02-16 DIAGNOSIS — F418 Other specified anxiety disorders: Secondary | ICD-10-CM | POA: Diagnosis not present

## 2021-02-16 DIAGNOSIS — F5104 Psychophysiologic insomnia: Secondary | ICD-10-CM | POA: Diagnosis not present

## 2021-02-16 NOTE — Progress Notes (Signed)
Debbie Wells [id number] 26-Dec-1940 80 y.o.  Subjective:   Patient ID:  Debbie Wells is a 80 y.o. (DOB [birth year]) female.  Chief Complaint: No chief complaint on file.   HPI MATTHEW PAIS presents to the office today for follow-up of GAD, MDD, and insomnia.  Describes mood today as "ok". Pleasant. Denies tearfulness. Mood symptoms - denies irritability. Some depression with the multiple changes she and husband going through. Increased worry "not like it was before". Feels anxious about leaving her home and moving into an apartment at [facility]. Has a lot of decisions to make with liquidating her assets. Continues to struggle with staying awake after morning medications. Stable interest and motivation. Taking medications as prescribed.  Energy levels improved. Active, does not have a regular exercise routine.  Enjoys some usual interests and activities. Married. Lives with husband x 58 years. Has 2 children - son 47 in [place], and daughter in [place]. Spending time with family. Appetite increased. Weight gain with Zyprexa.  Sleeps well most nights. Averages 8.5 hours. Focus and concentration improved. Completing tasks. Managing aspects of household. Retired. Denies SI or HI.  Denies AH or VH.  Previous medication trials: Unknown   Mini-Mental   Flowsheet Row Office Visit from 12/22/2020 in [facility]  Total Score (max 30 points ) 29    Flowsheet Row ED from 09/26/2020 in [facility] DEPT  C-SSRS RISK CATEGORY No Risk       Review of Systems:  Review of Systems  Musculoskeletal: Negative for gait problem.  Neurological: Negative for tremors.  Psychiatric/Behavioral:       Please refer to HPI    Medications: I have reviewed the patient's current medications.  Current Outpatient Medications  Medication Sig Dispense Refill  . ALPRAZolam (XANAX) 0.5 MG tablet Take 0.25-0.5 mg by mouth 3 (three) times daily as needed for  anxiety or sleep. Pt takes a full tab at night and whole a tab during day    . amLODipine (NORVASC) 5 MG tablet TAKE 1 TABLET BY MOUTH TWICE A DAY (Patient taking differently: Take 5 mg by mouth daily. ) 180 tablet 1  . aspirin 81 MG tablet Take 81 mg by mouth daily.     . bacitracin ophthalmic ointment Place 1 application into both eyes daily as needed for wound care. Dry eyes  0  . cephALEXin (KEFLEX) 250 MG capsule Take 250 mg by mouth daily.     Marland Kitchen escitalopram (LEXAPRO) 10 MG tablet Take 10 mg by mouth daily.    Marland Kitchen estrogens, conjugated, (PREMARIN) 0.45 MG tablet Take 0.45 mg by mouth daily.     Marland Kitchen gabapentin (NEURONTIN) 100 MG capsule Pt may take 1 capsule in the morning as needed and Always 1 capsule at bedtime (Patient taking differently: Take 100 mg by mouth 3 (three) times daily. ) 180 capsule 1  . losartan (COZAAR) 50 MG tablet TAKE 2 TABLETS (100 MG TOTAL) BY MOUTH DAILY. KEEP UPCOMING APPOINTMENT FOR REFILLS (Patient taking differently: Take 50 mg by mouth in the morning and at bedtime. ) 180 tablet 2  . Meth-Hyo-M Bl-Na Phos-Ph Sal (URIBEL) 118 MG CAPS Take 1 capsule by mouth daily as needed (For urine).     . metoprolol succinate (TOPROL-XL) 25 MG 24 hr tablet TAKE 1/2 TABLET BY MOUTH EVERY DAY (Patient taking differently: Take 12.5 mg by mouth daily. ) 45 tablet 3  . Multiple Vitamin (MULTIVITAMIN) capsule Take 1 capsule by mouth daily.    Marland Kitchen  Omega-3 Fatty Acids (FISH OIL PO) Take 1 tablet by mouth daily.    . polyethylene glycol (MIRALAX / GLYCOLAX) packet Take 17 g by mouth daily as needed for mild constipation.     . predniSONE (DELTASONE) 50 MG tablet CT pre-meds Take one tablet by mouth at 4AM on 08/28/2020, then take one tablet by mouth at 10:00am on 08/28/2020, then take one tablet by mouth at 4:00pm on 08/28/2020 3 tablet 0  . senna-docusate (SENOKOT-S) 8.6-50 MG tablet Take 1 tablet by mouth at bedtime. 30 tablet 0  . SYNTHROID 75 MCG tablet Take 75 mcg by mouth daily.   1  .  traZODone (DESYREL) 100 MG tablet Take 150 mg by mouth at bedtime.   0   No current facility-administered medications for this visit.    Medication Side Effects: None  Allergies:  Allergies  Allergen Reactions  . Hydrocodone-Acetaminophen Rash    Vicodin=REACTION: rash  . Iodinated Diagnostic Agents Hives and Rash    Per patient she has developed hives and itching in upper trunk of body after last 2 CT scans once she got homes that she has just treated at home with benadryl. She has not told anyone until now. States she was able to bear it  Other reaction(s): Rash Per patient she has developed hives and itching in upper trunk of body after last 2 CT scans once she got homes that she has just treated at home with benadryl. She has not told anyone until now. States she was able to bear it   . Sulfa Antibiotics Rash    REACTION: rash Other reaction(s): Unknown  . Codeine     Other reaction(s): Unknown  . Hydrocodone-Acetaminophen     Vicodin=REACTION: rash  . Sulfonamide Derivatives     REACTION: rash  . Terbinafine     Other reaction(s): Unknown  . Benazepril Cough    Other reaction(s): Cough    Past Medical History:  Diagnosis Date  . Adenomatous colon polyp   . Adenomatous polyp of colon 12/03/2004   105mm  . Anxiety   . Chronic headaches   . Chronic insomnia   . Depression   . Diverticulosis   . External hemorrhoids   . Hyperlipidemia   . Hypertension   . Hyperthyroidism   . Hypothyroidism   . Umbilical hernia     Family History  Problem Relation Age of Onset  . Esophageal cancer Father   . Stomach cancer Father        mets from esophagus  . Heart disease Mother   . Irritable bowel syndrome Mother   . Kidney disease Mother   . Other Mother        cushings disease  . Addison's disease Mother   . Heart failure Mother   . Stroke Mother   . Hypertension Mother   . Stroke Maternal Grandmother   . Hypertension Maternal Grandmother   . Colon cancer Neg Hx   .  Heart attack Neg Hx     Social History   Socioeconomic History  . Marital status: Married    Spouse name: Not on file  . Number of children: 2  . Years of education: Not on file  . Highest education level: Not on file  Occupational History  . Occupation: retired  Tobacco Use  . Smoking status: Former Smoker    Types: Cigarettes    Quit date: 01/23/1993    Years since quitting: 28.0  . Smokeless tobacco: Never Used  Vaping Use  .  Vaping Use: Never used  Substance and Sexual Activity  . Alcohol use: Yes    Alcohol/week: 2.0 standard drinks    Types: 2 Glasses of wine per week  . Drug use: No  . Sexual activity: Not on file  Other Topics Concern  . Not on file  Social History Narrative   Patient is married she is retired and has 2 children   About 2 alcoholic beverages a week no drugs, no current tobacco she is a former smoker      Lives at home with her husband   Right handed   Social Determinants of Health   Financial Resource Strain: Not on file  Food Insecurity: Not on file  Transportation Needs: Not on file  Physical Activity: Not on file  Stress: Not on file  Social Connections: Not on file  Intimate Partner Violence: Not on file    Past Medical History, Surgical history, Social history, and Family history were reviewed and updated as appropriate.   Please see review of systems for further details on the patient's review from today.   Objective:   Physical Exam:  There were no vitals taken for this visit.  Physical Exam Constitutional:      General: She is not in acute distress. Musculoskeletal:        General: No deformity.  Neurological:     Mental Status: She is alert and oriented to person, place, and time.     Coordination: Coordination normal.  Psychiatric:        Attention and Perception: Attention and perception normal. She does not perceive auditory or visual hallucinations.        Mood and Affect: Mood normal. Mood is not anxious or  depressed. Affect is not labile, blunt, angry or inappropriate.        Speech: Speech normal.        Behavior: Behavior normal.        Thought Content: Thought content normal. Thought content is not paranoid or delusional. Thought content does not include homicidal or suicidal ideation. Thought content does not include homicidal or suicidal plan.        Cognition and Memory: Cognition and memory normal.        Judgment: Judgment normal.     Comments: Insight intact     Lab Review:     Component Value Date/Time   NA 130 (L) 09/26/2020 1745   NA 142 12/28/2018 1515   K 4.2 09/26/2020 1745   CL 94 (L) 09/26/2020 1745   CO2 24 09/26/2020 1745   GLUCOSE 107 (H) 09/26/2020 1745   BUN 11 09/26/2020 1745   BUN 10 12/28/2018 1515   CREATININE 0.80 09/26/2020 1745   CALCIUM 9.6 09/26/2020 1745   PROT 7.5 09/26/2020 1745   PROT 7.0 12/28/2018 1515   ALBUMIN 4.5 09/26/2020 1745   ALBUMIN 4.8 (H) 12/28/2018 1515   AST 28 09/26/2020 1745   ALT 25 09/26/2020 1745   ALKPHOS 63 09/26/2020 1745   BILITOT 0.6 09/26/2020 1745   BILITOT 0.3 12/28/2018 1515   GFRNONAA >60 09/26/2020 1745   GFRAA 75 12/28/2018 1515       Component Value Date/Time   WBC 6.9 09/26/2020 1745   RBC 4.08 09/26/2020 1745   HGB 12.7 09/26/2020 1745   HCT 37.5 09/26/2020 1745   PLT 298 09/26/2020 1745   MCV 91.9 09/26/2020 1745   MCH 31.1 09/26/2020 1745   MCHC 33.9 09/26/2020 1745   RDW 12.9 09/26/2020 1745  LYMPHSABS 1.0 09/26/2020 1745   MONOABS 0.6 09/26/2020 1745   EOSABS 0.1 09/26/2020 1745   BASOSABS 0.0 09/26/2020 1745    No results found for: POCLITH, LITHIUM   No results found for: PHENYTOIN, PHENOBARB, VALPROATE, CBMZ   .res Assessment: Plan:    Plan:  PDMP reviewed  1. Buspar 30mg   2. Zyprexa 1/2 of a 2.5mg  daily 3. Xanax 0.5mg  TID - only taking twice daily 4. Cymbalta 60mg  daily   RTC 4 weeks  Patient advised to contact office with any questions, adverse effects, or acute  worsening in signs and symptoms.  Discussed potential benefits, risk, and side effects of benzodiazepines to include potential risk of tolerance and dependence, as well as possible drowsiness.  Advised patient not to drive if experiencing drowsiness and to take lowest possible effective dose to minimize risk of dependence and tolerance.  Discussed potential metabolic side effects associated with atypical antipsychotics, as well as potential risk for movement side effects. Advised pt to contact office if movement side effects occur.     Diagnoses and all orders for this visit:  Anxiety about health  Insomnia, unspecified type  Major depressive disorder, recurrent episode, moderate (HCC)  Chronic insomnia     Please see After Visit Summary for patient specific instructions.  Future Appointments  Date Time Provider [place]  02/19/2021 10:00 AM Dohmeier, Asencion Partridge, MD [facility] None    No orders of the defined types were placed in this encounter.   -------------------------------

## 2021-02-19 ENCOUNTER — Encounter: Payer: Self-pay | Admitting: Neurology

## 2021-02-19 ENCOUNTER — Ambulatory Visit: Payer: Medicare PPO | Admitting: Adult Health

## 2021-02-19 ENCOUNTER — Ambulatory Visit: Payer: Medicare PPO | Admitting: Neurology

## 2021-03-16 ENCOUNTER — Ambulatory Visit (INDEPENDENT_AMBULATORY_CARE_PROVIDER_SITE_OTHER): Payer: Medicare PPO | Admitting: Adult Health

## 2021-03-16 ENCOUNTER — Other Ambulatory Visit: Payer: Self-pay

## 2021-03-16 ENCOUNTER — Encounter: Payer: Self-pay | Admitting: Adult Health

## 2021-03-16 DIAGNOSIS — F331 Major depressive disorder, recurrent, moderate: Secondary | ICD-10-CM | POA: Diagnosis not present

## 2021-03-16 DIAGNOSIS — F418 Other specified anxiety disorders: Secondary | ICD-10-CM

## 2021-03-16 DIAGNOSIS — G4700 Insomnia, unspecified: Secondary | ICD-10-CM | POA: Diagnosis not present

## 2021-03-16 NOTE — Progress Notes (Signed)
Debbie Wells [id number] January 21, 1941 80 y.o.  Subjective:   Patient ID:  Debbie Wells is a 80 y.o. (DOB [birth year]) female.  Chief Complaint: No chief complaint on file.   HPI Debbie Wells presents to the office today for follow-up of GAD, MDD, and insomnia.  Describes mood today as "ok". Pleasant. Denies tearfulness. Mood symptoms - denies depression. Feels anxious "always". Reports irritability - at times. Concerns about memory issues - plans to see neurologist. Husband with recent falls and can't do his normal routine. Stating "we have gotten on each other's nerves some". Trying to be more patient. Feels stressed with having to liquidate house and belongings. Children trying to help, but are also busy.  Still working to get things out of her house she wants to keep. She and husband have moved into an apartment at [facility]. Feels happy with the apartment - "I was able to keep the things I wanted". Stable interest and motivation. Taking medications as prescribed.  Energy levels "not as good as I would like". Active, does not have a regular exercise routine.  Enjoys some usual interests and activities. Married. Lives with husband. Has 2 children - son 28 in [place], and daughter in [place]. Spending time with family. Appetite increased. Weight gain with Zyprexa - now at 160 pounds.  Sleeps well most nights. Averages 8.5 hours. Focus and concentration improved. Completing tasks. Managing aspects of household. Retired. Denies SI or HI.  Denies AH or VH.  Previous medication trials: Unknown    Mini-Mental   Flowsheet Row Office Visit from 12/22/2020 in [facility]  Total Score (max 30 points ) 29    Flowsheet Row ED from 09/26/2020 in [facility] [facility] DEPT  C-SSRS RISK CATEGORY No Risk       Review of Systems:  Review of Systems  Musculoskeletal: Negative for gait problem.  Neurological: Negative for tremors.   Psychiatric/Behavioral:       Please refer to HPI    Medications: I have reviewed the patient's current medications.  Current Outpatient Medications  Medication Sig Dispense Refill  . ALPRAZolam (XANAX) 0.5 MG tablet Take 0.25-0.5 mg by mouth 3 (three) times daily as needed for anxiety or sleep. Pt takes a full tab at night and whole a tab during day    . amLODipine (NORVASC) 5 MG tablet TAKE 1 TABLET BY MOUTH TWICE A DAY (Patient taking differently: Take 5 mg by mouth daily. ) 180 tablet 1  . aspirin 81 MG tablet Take 81 mg by mouth daily.     . bacitracin ophthalmic ointment Place 1 application into both eyes daily as needed for wound care. Dry eyes  0  . cephALEXin (KEFLEX) 250 MG capsule Take 250 mg by mouth daily.     Marland Kitchen escitalopram (LEXAPRO) 10 MG tablet Take 10 mg by mouth daily.    Marland Kitchen estrogens, conjugated, (PREMARIN) 0.45 MG tablet Take 0.45 mg by mouth daily.     Marland Kitchen gabapentin (NEURONTIN) 100 MG capsule Pt may take 1 capsule in the morning as needed and Always 1 capsule at bedtime (Patient taking differently: Take 100 mg by mouth 3 (three) times daily. ) 180 capsule 1  . losartan (COZAAR) 50 MG tablet TAKE 2 TABLETS (100 MG TOTAL) BY MOUTH DAILY. KEEP UPCOMING APPOINTMENT FOR REFILLS (Patient taking differently: Take 50 mg by mouth in the morning and at bedtime. ) 180 tablet 2  . Meth-Hyo-M Bl-Na Phos-Ph Sal (URIBEL) 118 MG CAPS Take 1 capsule  by mouth daily as needed (For urine).     . metoprolol succinate (TOPROL-XL) 25 MG 24 hr tablet TAKE 1/2 TABLET BY MOUTH EVERY DAY (Patient taking differently: Take 12.5 mg by mouth daily. ) 45 tablet 3  . Multiple Vitamin (MULTIVITAMIN) capsule Take 1 capsule by mouth daily.    . Omega-3 Fatty Acids (FISH OIL PO) Take 1 tablet by mouth daily.    . polyethylene glycol (MIRALAX / GLYCOLAX) packet Take 17 g by mouth daily as needed for mild constipation.     . predniSONE (DELTASONE) 50 MG tablet CT pre-meds Take one tablet by mouth at 4AM on  08/28/2020, then take one tablet by mouth at 10:00am on 08/28/2020, then take one tablet by mouth at 4:00pm on 08/28/2020 3 tablet 0  . senna-docusate (SENOKOT-S) 8.6-50 MG tablet Take 1 tablet by mouth at bedtime. 30 tablet 0  . SYNTHROID 75 MCG tablet Take 75 mcg by mouth daily.   1   No current facility-administered medications for this visit.    Medication Side Effects: None  Allergies:  Allergies  Allergen Reactions  . Hydrocodone-Acetaminophen Rash    Vicodin=REACTION: rash  . Iodinated Diagnostic Agents Hives and Rash    Per patient she has developed hives and itching in upper trunk of body after last 2 CT scans once she got homes that she has just treated at home with benadryl. She has not told anyone until now. States she was able to bear it  Other reaction(s): Rash Per patient she has developed hives and itching in upper trunk of body after last 2 CT scans once she got homes that she has just treated at home with benadryl. She has not told anyone until now. States she was able to bear it   . Sulfa Antibiotics Rash    REACTION: rash Other reaction(s): Unknown  . Codeine     Other reaction(s): Unknown  . Hydrocodone-Acetaminophen     Vicodin=REACTION: rash  . Sulfonamide Derivatives     REACTION: rash  . Terbinafine     Other reaction(s): Unknown  . Benazepril Cough    Other reaction(s): Cough    Past Medical History:  Diagnosis Date  . Adenomatous colon polyp   . Adenomatous polyp of colon 12/03/2004   55mm  . Anxiety   . Chronic headaches   . Chronic insomnia   . Depression   . Diverticulosis   . External hemorrhoids   . Hyperlipidemia   . Hypertension   . Hyperthyroidism   . Hypothyroidism   . Umbilical hernia     Past Medical History, Surgical history, Social history, and Family history were reviewed and updated as appropriate.   Please see review of systems for further details on the patient's review from today.   Objective:   Physical Exam:  There  were no vitals taken for this visit.  Physical Exam Constitutional:      General: She is not in acute distress. Musculoskeletal:        General: No deformity.  Neurological:     Mental Status: She is alert and oriented to person, place, and time.     Coordination: Coordination normal.  Psychiatric:        Attention and Perception: Attention and perception normal. She does not perceive auditory or visual hallucinations.        Mood and Affect: Mood normal. Mood is not anxious or depressed. Affect is not labile, blunt, angry or inappropriate.  Speech: Speech normal.        Behavior: Behavior normal.        Thought Content: Thought content normal. Thought content is not paranoid or delusional. Thought content does not include homicidal or suicidal ideation. Thought content does not include homicidal or suicidal plan.        Cognition and Memory: Cognition and memory normal.        Judgment: Judgment normal.     Comments: Insight intact     Lab Review:     Component Value Date/Time   NA 130 (L) 09/26/2020 1745   NA 142 12/28/2018 1515   K 4.2 09/26/2020 1745   CL 94 (L) 09/26/2020 1745   CO2 24 09/26/2020 1745   GLUCOSE 107 (H) 09/26/2020 1745   BUN 11 09/26/2020 1745   BUN 10 12/28/2018 1515   CREATININE 0.80 09/26/2020 1745   CALCIUM 9.6 09/26/2020 1745   PROT 7.5 09/26/2020 1745   PROT 7.0 12/28/2018 1515   ALBUMIN 4.5 09/26/2020 1745   ALBUMIN 4.8 (H) 12/28/2018 1515   AST 28 09/26/2020 1745   ALT 25 09/26/2020 1745   ALKPHOS 63 09/26/2020 1745   BILITOT 0.6 09/26/2020 1745   BILITOT 0.3 12/28/2018 1515   GFRNONAA >60 09/26/2020 1745   GFRAA 75 12/28/2018 1515       Component Value Date/Time   WBC 6.9 09/26/2020 1745   RBC 4.08 09/26/2020 1745   HGB 12.7 09/26/2020 1745   HCT 37.5 09/26/2020 1745   PLT 298 09/26/2020 1745   MCV 91.9 09/26/2020 1745   MCH 31.1 09/26/2020 1745   MCHC 33.9 09/26/2020 1745   RDW 12.9 09/26/2020 1745   LYMPHSABS 1.0  09/26/2020 1745   MONOABS 0.6 09/26/2020 1745   EOSABS 0.1 09/26/2020 1745   BASOSABS 0.0 09/26/2020 1745    No results found for: POCLITH, LITHIUM   No results found for: PHENYTOIN, PHENOBARB, VALPROATE, CBMZ   .res Assessment: Plan:     Plan:  PDMP reviewed  1. Buspar 30mg   2. D/C Zyprexa 1/2 of a 2.5mg  daily 3. Xanax 0.5mg  TID - only taking twice daily 4. Cymbalta 60mg  daily   RTC 4 weeks  Patient advised to contact office with any questions, adverse effects, or acute worsening in signs and symptoms.  Discussed potential benefits, risk, and side effects of benzodiazepines to include potential risk of tolerance and dependence, as well as possible drowsiness.  Advised patient not to drive if experiencing drowsiness and to take lowest possible effective dose to minimize risk of dependence and tolerance.  Discussed potential metabolic side effects associated with atypical antipsychotics, as well as potential risk for movement side effects. Advised pt to contact office if movement side effects occur.   Diagnoses and all orders for this visit:  Insomnia, unspecified type  Major depressive disorder, recurrent episode, moderate ([place])  Anxiety about health     Please see After Visit Summary for patient specific instructions.  No future appointments.  No orders of the defined types were placed in this encounter.   -------------------------------

## 2021-03-20 ENCOUNTER — Telehealth: Payer: Self-pay | Admitting: Adult Health

## 2021-03-20 NOTE — Telephone Encounter (Signed)
Called x 2 leaving voice message to return call.

## 2021-03-31 ENCOUNTER — Telehealth: Payer: Self-pay | Admitting: Adult Health

## 2021-03-31 NOTE — Telephone Encounter (Signed)
Pt left a message that she was returning regina's call. Please call her back at [phone]

## 2021-03-31 NOTE — Telephone Encounter (Signed)
Did you try to call her?  °

## 2021-04-01 NOTE — Telephone Encounter (Signed)
Noted  

## 2021-04-15 ENCOUNTER — Ambulatory Visit: Payer: Medicare PPO | Admitting: Adult Health

## 2021-04-22 ENCOUNTER — Ambulatory Visit: Payer: Medicare PPO | Attending: Internal Medicine

## 2021-04-22 ENCOUNTER — Other Ambulatory Visit (HOSPITAL_BASED_OUTPATIENT_CLINIC_OR_DEPARTMENT_OTHER): Payer: Self-pay

## 2021-04-22 DIAGNOSIS — Z23 Encounter for immunization: Secondary | ICD-10-CM

## 2021-04-22 MED ORDER — PFIZER-BIONT COVID-19 VAC-TRIS 30 MCG/0.3ML IM SUSP
INTRAMUSCULAR | 0 refills | Status: DC
  Filled 2021-04-22: qty 0.3, 1d supply, fill #0

## 2021-04-22 NOTE — Progress Notes (Signed)
   Covid-19 Vaccination Clinic  Name:  SHARILYN GEISINGER    MRN: [id number] DOB: 1941/02/04  04/22/2021  Ms. Morris was observed post Covid-19 immunization for 15 minutes without incident. She was provided with Vaccine Information Sheet and instruction to access the V-Safe system.   Ms. Recchia was instructed to call 911 with any severe reactions post vaccine: Difficulty breathing  Swelling of face and throat  A fast heartbeat  A bad rash all over body  Dizziness and weakness   Immunizations Administered     Name Date Dose VIS Date Route   PFIZER Comrnaty(Gray TOP) Covid-19 Vaccine 04/22/2021  3:25 PM 0.3 mL 10/09/2020 Intramuscular   Manufacturer: [employer]   Lot: [id number]   [place]: [phone]

## 2021-04-23 ENCOUNTER — Other Ambulatory Visit (HOSPITAL_BASED_OUTPATIENT_CLINIC_OR_DEPARTMENT_OTHER): Payer: Self-pay

## 2021-08-03 ENCOUNTER — Other Ambulatory Visit: Payer: Self-pay

## 2021-08-03 ENCOUNTER — Encounter: Payer: Self-pay | Admitting: Adult Health

## 2021-08-03 ENCOUNTER — Ambulatory Visit (INDEPENDENT_AMBULATORY_CARE_PROVIDER_SITE_OTHER): Payer: Medicare PPO | Admitting: Adult Health

## 2021-08-03 DIAGNOSIS — F331 Major depressive disorder, recurrent, moderate: Secondary | ICD-10-CM | POA: Diagnosis not present

## 2021-08-03 DIAGNOSIS — F418 Other specified anxiety disorders: Secondary | ICD-10-CM | POA: Diagnosis not present

## 2021-08-03 DIAGNOSIS — G4700 Insomnia, unspecified: Secondary | ICD-10-CM

## 2021-08-03 NOTE — Progress Notes (Signed)
Debbie Wells [id number] [birth year] 80 y.o.  Subjective:   Patient ID:  Debbie Wells is a 80 y.o. (DOB 22-Feb-1941) female.  Chief Complaint: No chief complaint on file.   HPI Debbie Wells presents to the office today for follow-up of GAD, MDD, and insomnia.  Describes mood today as "ok". Pleasant. Denies tearfulness - "very little". Mood symptoms - reports depression - "I have those feelings more often than I would like" - "more situational". Feels anxious "not all the time". Reports irritability - "sometimes". Reports some rumination over events leading to she and husband living at [facility]. Stating "I've come to realize that things are different". Husband recovering from back injury. Concerns about memory issues - plans to see neurologist. Stable interest and motivation. Taking medications as prescribed.  Energy levels lower. Active, has a regular exercise routine.  Enjoys some usual interests and activities. Married. Lives with husband. Has 2 children - son in [place], and daughter in [place]. Spending time with family. Appetite increased. Weight stable 160 pounds.  Sleeps well most nights. Averages 8.5 hours most nights. Focus and concentration improved. Completing tasks. Managing aspects of household. Retired. Denies SI or HI.  Denies AH or VH.  Previous medication trials: Unknown   Mini-Mental    Flowsheet Row Office Visit from 12/22/2020 in [facility]  Total Score (max 30 points ) 29      Flowsheet Row ED from 09/26/2020 in [facility] DEPT  C-SSRS RISK CATEGORY No Risk        Review of Systems:  Review of Systems  Musculoskeletal:  Negative for gait problem.  Neurological:  Negative for tremors.  Psychiatric/Behavioral:         Please refer to HPI   Medications: I have reviewed the patient's current medications.  Current Outpatient Medications  Medication Sig Dispense Refill   ALPRAZolam (XANAX) 0.5 MG  tablet Take 0.25-0.5 mg by mouth 3 (three) times daily as needed for anxiety or sleep. Pt takes a full tab at night and whole a tab during day     amLODipine (NORVASC) 5 MG tablet TAKE 1 TABLET BY MOUTH TWICE A DAY (Patient taking differently: Take 5 mg by mouth daily. ) 180 tablet 1   aspirin 81 MG tablet Take 81 mg by mouth daily.      bacitracin ophthalmic ointment Place 1 application into both eyes daily as needed for wound care. Dry eyes  0   cephALEXin (KEFLEX) 250 MG capsule Take 250 mg by mouth daily.      COVID-19 mRNA Vac-TriS, Pfizer, (PFIZER-BIONT COVID-19 VAC-TRIS) SUSP injection Inject into the muscle. 0.3 mL 0   escitalopram (LEXAPRO) 10 MG tablet Take 10 mg by mouth daily.     estrogens, conjugated, (PREMARIN) 0.45 MG tablet Take 0.45 mg by mouth daily.      gabapentin (NEURONTIN) 100 MG capsule Pt may take 1 capsule in the morning as needed and Always 1 capsule at bedtime (Patient taking differently: Take 100 mg by mouth 3 (three) times daily. ) 180 capsule 1   losartan (COZAAR) 50 MG tablet TAKE 2 TABLETS (100 MG TOTAL) BY MOUTH DAILY. KEEP UPCOMING APPOINTMENT FOR REFILLS (Patient taking differently: Take 50 mg by mouth in the morning and at bedtime. ) 180 tablet 2   Meth-Hyo-M Bl-Na Phos-Ph Sal (URIBEL) 118 MG CAPS Take 1 capsule by mouth daily as needed (For urine).      metoprolol succinate (TOPROL-XL) 25 MG 24 hr tablet TAKE 1/2  TABLET BY MOUTH EVERY DAY (Patient taking differently: Take 12.5 mg by mouth daily. ) 45 tablet 3   Multiple Vitamin (MULTIVITAMIN) capsule Take 1 capsule by mouth daily.     Omega-3 Fatty Acids (FISH OIL PO) Take 1 tablet by mouth daily.     polyethylene glycol (MIRALAX / GLYCOLAX) packet Take 17 g by mouth daily as needed for mild constipation.      predniSONE (DELTASONE) 50 MG tablet CT pre-meds Take one tablet by mouth at 4AM on 08/28/2020, then take one tablet by mouth at 10:00am on 08/28/2020, then take one tablet by mouth at 4:00pm on 08/28/2020  3 tablet 0   senna-docusate (SENOKOT-S) 8.6-50 MG tablet Take 1 tablet by mouth at bedtime. 30 tablet 0   SYNTHROID 75 MCG tablet Take 75 mcg by mouth daily.   1   No current facility-administered medications for this visit.    Medication Side Effects: None  Allergies:  Allergies  Allergen Reactions   Hydrocodone-Acetaminophen Rash    Vicodin=REACTION: rash   Iodinated Diagnostic Agents Hives and Rash    Per patient she has developed hives and itching in upper trunk of body after last 2 CT scans once she got homes that she has just treated at home with benadryl. She has not told anyone until now. States she was able to bear it  Other reaction(s): Rash Per patient she has developed hives and itching in upper trunk of body after last 2 CT scans once she got homes that she has just treated at home with benadryl. She has not told anyone until now. States she was able to bear it    Sulfa Antibiotics Rash    REACTION: rash Other reaction(s): Unknown   Codeine     Other reaction(s): Unknown   Hydrocodone-Acetaminophen     Vicodin=REACTION: rash   Sulfonamide Derivatives     REACTION: rash   Terbinafine     Other reaction(s): Unknown   Benazepril Cough    Other reaction(s): Cough    Past Medical History:  Diagnosis Date   Adenomatous colon polyp    Adenomatous polyp of colon 12/03/2004   55mm   Anxiety    Chronic headaches    Chronic insomnia    Depression    Diverticulosis    External hemorrhoids    Hyperlipidemia    Hypertension    Hyperthyroidism    Hypothyroidism    Umbilical hernia     Past Medical History, Surgical history, Social history, and Family history were reviewed and updated as appropriate.   Please see review of systems for further details on the patient's review from today.   Objective:   Physical Exam:  There were no vitals taken for this visit.  Physical Exam Constitutional:      General: She is not in acute distress. Musculoskeletal:         General: No deformity.  Neurological:     Mental Status: She is alert and oriented to person, place, and time.     Coordination: Coordination normal.  Psychiatric:        Attention and Perception: Attention and perception normal. She does not perceive auditory or visual hallucinations.        Mood and Affect: Mood normal. Mood is not anxious or depressed. Affect is not labile, blunt, angry or inappropriate.        Speech: Speech normal.        Behavior: Behavior normal.        Thought Content:  Thought content normal. Thought content is not paranoid or delusional. Thought content does not include homicidal or suicidal ideation. Thought content does not include homicidal or suicidal plan.        Cognition and Memory: Cognition and memory normal.        Judgment: Judgment normal.     Comments: Insight intact    Lab Review:     Component Value Date/Time   NA 130 (L) 09/26/2020 1745   NA 142 12/28/2018 1515   K 4.2 09/26/2020 1745   CL 94 (L) 09/26/2020 1745   CO2 24 09/26/2020 1745   GLUCOSE 107 (H) 09/26/2020 1745   BUN 11 09/26/2020 1745   BUN 10 12/28/2018 1515   CREATININE 0.80 09/26/2020 1745   CALCIUM 9.6 09/26/2020 1745   PROT 7.5 09/26/2020 1745   PROT 7.0 12/28/2018 1515   ALBUMIN 4.5 09/26/2020 1745   ALBUMIN 4.8 (H) 12/28/2018 1515   AST 28 09/26/2020 1745   ALT 25 09/26/2020 1745   ALKPHOS 63 09/26/2020 1745   BILITOT 0.6 09/26/2020 1745   BILITOT 0.3 12/28/2018 1515   GFRNONAA >60 09/26/2020 1745   GFRAA 75 12/28/2018 1515       Component Value Date/Time   WBC 6.9 09/26/2020 1745   RBC 4.08 09/26/2020 1745   HGB 12.7 09/26/2020 1745   HCT 37.5 09/26/2020 1745   PLT 298 09/26/2020 1745   MCV 91.9 09/26/2020 1745   MCH 31.1 09/26/2020 1745   MCHC 33.9 09/26/2020 1745   RDW 12.9 09/26/2020 1745   LYMPHSABS 1.0 09/26/2020 1745   MONOABS 0.6 09/26/2020 1745   EOSABS 0.1 09/26/2020 1745   BASOSABS 0.0 09/26/2020 1745    No results found for: POCLITH,  LITHIUM   No results found for: PHENYTOIN, PHENOBARB, VALPROATE, CBMZ   .res Assessment: Plan:    Plan:  PDMP reviewed  1. Buspar 30mg   2. D/C Zyprexa 1/2 of a 2.5mg  daily 3. Xanax 0.5mg  TID - only taking twice daily 4. Cymbalta 60mg  daily   RTC 4 weeks  Patient advised to contact office with any questions, adverse effects, or acute worsening in signs and symptoms.  Discussed potential benefits, risk, and side effects of benzodiazepines to include potential risk of tolerance and dependence, as well as possible drowsiness.  Advised patient not to drive if experiencing drowsiness and to take lowest possible effective dose to minimize risk of dependence and tolerance.  Discussed potential metabolic side effects associated with atypical antipsychotics, as well as potential risk for movement side effects. Advised pt to contact office if movement side effects occur.  There are no diagnoses linked to this encounter.   Please see After Visit Summary for patient specific instructions.  No future appointments.  No orders of the defined types were placed in this encounter.   -------------------------------

## 2021-08-07 ENCOUNTER — Other Ambulatory Visit: Payer: Self-pay

## 2021-08-07 ENCOUNTER — Emergency Department (HOSPITAL_BASED_OUTPATIENT_CLINIC_OR_DEPARTMENT_OTHER): Payer: Medicare PPO

## 2021-08-07 ENCOUNTER — Emergency Department (HOSPITAL_BASED_OUTPATIENT_CLINIC_OR_DEPARTMENT_OTHER)
Admission: EM | Admit: 2021-08-07 | Discharge: 2021-08-07 | Disposition: A | Discharge status: Home / Self Care | Payer: Medicare PPO | Attending: Emergency Medicine | Admitting: Emergency Medicine

## 2021-08-07 DIAGNOSIS — E039 Hypothyroidism, unspecified: Secondary | ICD-10-CM | POA: Insufficient documentation

## 2021-08-07 DIAGNOSIS — Z87891 Personal history of nicotine dependence: Secondary | ICD-10-CM | POA: Insufficient documentation

## 2021-08-07 DIAGNOSIS — R110 Nausea: Secondary | ICD-10-CM | POA: Diagnosis not present

## 2021-08-07 DIAGNOSIS — Z79899 Other long term (current) drug therapy: Secondary | ICD-10-CM | POA: Insufficient documentation

## 2021-08-07 DIAGNOSIS — E871 Hypo-osmolality and hyponatremia: Secondary | ICD-10-CM | POA: Insufficient documentation

## 2021-08-07 DIAGNOSIS — N1831 Chronic kidney disease, stage 3a: Secondary | ICD-10-CM | POA: Diagnosis not present

## 2021-08-07 DIAGNOSIS — R531 Weakness: Secondary | ICD-10-CM | POA: Diagnosis present

## 2021-08-07 DIAGNOSIS — Z7982 Long term (current) use of aspirin: Secondary | ICD-10-CM | POA: Diagnosis not present

## 2021-08-07 DIAGNOSIS — Z20822 Contact with and (suspected) exposure to covid-19: Secondary | ICD-10-CM | POA: Diagnosis not present

## 2021-08-07 DIAGNOSIS — I129 Hypertensive chronic kidney disease with stage 1 through stage 4 chronic kidney disease, or unspecified chronic kidney disease: Secondary | ICD-10-CM | POA: Insufficient documentation

## 2021-08-07 LAB — CBC WITH DIFFERENTIAL/PLATELET
Abs Immature Granulocytes: 0.01 10*3/uL (ref 0.00–0.07)
Basophils Absolute: 0.1 10*3/uL (ref 0.0–0.1)
Basophils Relative: 2 %
Eosinophils Absolute: 0.3 10*3/uL (ref 0.0–0.5)
Eosinophils Relative: 7 %
HCT: 36.2 % (ref 36.0–46.0)
Hemoglobin: 12 g/dL (ref 12.0–15.0)
Immature Granulocytes: 0 %
Lymphocytes Relative: 22 %
Lymphs Abs: 1.2 10*3/uL (ref 0.7–4.0)
MCH: 30.1 pg (ref 26.0–34.0)
MCHC: 33.1 g/dL (ref 30.0–36.0)
MCV: 90.7 fL (ref 80.0–100.0)
Monocytes Absolute: 0.8 10*3/uL (ref 0.1–1.0)
Monocytes Relative: 15 %
Neutro Abs: 2.9 10*3/uL (ref 1.7–7.7)
Neutrophils Relative %: 54 %
Platelets: 221 10*3/uL (ref 150–400)
RBC: 3.99 MIL/uL (ref 3.87–5.11)
RDW: 12.7 % (ref 11.5–15.5)
WBC: 5.2 10*3/uL (ref 4.0–10.5)
nRBC: 0 % (ref 0.0–0.2)

## 2021-08-07 LAB — COMPREHENSIVE METABOLIC PANEL
ALT: 16 U/L (ref 0–44)
AST: 21 U/L (ref 15–41)
Albumin: 4 g/dL (ref 3.5–5.0)
Alkaline Phosphatase: 55 U/L (ref 38–126)
Anion gap: 8 (ref 5–15)
BUN: 16 mg/dL (ref 8–23)
CO2: 27 mmol/L (ref 22–32)
Calcium: 9.1 mg/dL (ref 8.9–10.3)
Chloride: 99 mmol/L (ref 98–111)
Creatinine, Ser: 0.91 mg/dL (ref 0.44–1.00)
GFR, Estimated: >60 mL/min (ref >60)
Glucose, Bld: 91 mg/dL (ref 70–99)
Potassium: 4.4 mmol/L (ref 3.5–5.1)
Sodium: 134 mmol/L — ABNORMAL LOW (ref 135–145)
Total Bilirubin: 0.4 mg/dL (ref 0.3–1.2)
Total Protein: 6.9 g/dL (ref 6.5–8.1)

## 2021-08-07 LAB — URINALYSIS, ROUTINE W REFLEX MICROSCOPIC
Bilirubin Urine: NEGATIVE
Glucose, UA: NEGATIVE mg/dL
Hgb urine dipstick: NEGATIVE
Ketones, ur: NEGATIVE mg/dL
Leukocytes,Ua: NEGATIVE
Nitrite: NEGATIVE
Protein, ur: NEGATIVE mg/dL
Specific Gravity, Urine: 1.009 (ref 1.005–1.030)
pH: 6.5 (ref 5.0–8.0)

## 2021-08-07 LAB — RESP PANEL BY RT-PCR (FLU A&B, COVID) ARPGX2
Influenza A by PCR: NEGATIVE
Influenza B by PCR: NEGATIVE
SARS Coronavirus 2 by RT PCR: NEGATIVE

## 2021-08-07 MED ORDER — SODIUM CHLORIDE 0.9 % IV BOLUS
500.0000 mL | Freq: Once | INTRAVENOUS | Status: AC
  Administered 2021-08-07: 500 mL via INTRAVENOUS

## 2021-08-07 NOTE — ED Triage Notes (Signed)
Pt states nausea and lethargy for several weeks.  She saw he MD earlier this week and had labs drawn.  Pt received call from her MD today stating her sodium levels are very low and advised she come to ED for evaluation.

## 2021-08-07 NOTE — ED Provider Notes (Signed)
Care of patient assumed from Dr. Reather Converse at [facility].  This patient presents for lethargy, headache, and nausea.  She was sent by her PCP due to concerns of hyponatremia.  On lab work, sodium is low-normal.  Follow-up on results of CT head and reassess. Physical Exam  BP 138/64 (BP Location: Left Arm)   Pulse 62   Temp 98.2 F (36.8 C) (Oral)   Resp 16   Ht 5\' 4"  (1.626 m)   Wt 72.1 kg   SpO2 96%   BMI 27.29 kg/m   Physical Exam Constitutional:      General: She is not in acute distress.    Appearance: Normal appearance. She is normal weight. She is not ill-appearing, toxic-appearing or diaphoretic.  HENT:     Head: Normocephalic and atraumatic.     Nose: Nose normal.  Eyes:     Extraocular Movements: Extraocular movements intact.     Conjunctiva/sclera: Conjunctivae normal.  Skin:    General: Skin is warm and dry.  Neurological:     General: No focal deficit present.     Mental Status: She is alert and oriented to person, place, and time.     Cranial Nerves: No cranial nerve deficit.     Sensory: No sensory deficit.     Motor: No weakness.  Psychiatric:        Mood and Affect: Mood normal.        Behavior: Behavior normal.    ED Course/Procedures     Procedures  MDM  CT scan of head showed no acute intracranial abnormalities.  On assessment, patient sitting up on ED stretcher, resting comfortably.  Her husband is at bedside.  Patient stated that she felt fine.  She has had ongoing diminished appetite, decreased energy, and sleep disturbance over the past year.  Over this time, she has been followed by behavioral health for anxiety and depression.  She reports that her mood has improved and has been stable.  Ongoing symptoms of decreased appetite, energy, and sleep could be from dysphoric mood and/or medications.  She has been working with her PCP to optimize her medically.  She has undergone recent changes in thyroid medication.  It is from 1 of these follow-up appointments  where there was concern of hyponatremia.  Patient's baseline hyponatremia is 130-135.  Today it is 134.  Patient was informed that her labs, including her sodium level are reassuring.  She denies any other concerns at this time.  Patient was discharged in good condition.       Godfrey Pick, MD 08/09/21 1242

## 2021-08-07 NOTE — ED Notes (Signed)
Pt ambulated to bathroom, gait steady

## 2021-08-07 NOTE — ED Provider Notes (Signed)
[facility] Provider Note   CSN: [id number] Arrival date & time: 08/07/21  1336     History Chief Complaint  Patient presents with   Nausea    Debbie Wells is a 80 y.o. female.  Patient with history of anxiety, chronic headaches, hypothyroidism, high blood pressure, vascular disease, urine infections presents with general weakness, lethargy, fatigue.  Patient saw primary doctor earlier this week and had blood work done received a call that her sodium levels were low and advised to go to the emergency department immediately.  No seizure activity witnessed by husband.  Patient been weaker than normal.  Nothing is improved.  No new medications.  No alcohol use.      Past Medical History:  Diagnosis Date   Adenomatous colon polyp    Adenomatous polyp of colon 12/03/2004   22mm   Anxiety    Chronic headaches    Chronic insomnia    Depression    Diverticulosis    External hemorrhoids    Hyperlipidemia    Hypertension    Hyperthyroidism    Hypothyroidism    Umbilical hernia     Patient Active Problem List   Diagnosis Date Noted   Altered consciousness 12/22/2020   Hardening of the aorta (main artery of the heart) ([place]) 12/22/2020   Moderate recurrent major depression ([place]) 12/22/2020   Recurrent urinary tract infection 12/22/2020   Elevated liver enzymes 10/03/2020   History of recurrent UTIs 10/03/2020   Hyperlipidemia 10/03/2020   Major depressive disorder, recurrent severe without psychotic features ([place]) 10/03/2020   Urinary retention 10/03/2020   Hereditary and idiopathic neuropathy, unspecified 05/06/2020   Chronic insomnia 03/20/2020   Chronic kidney disease due to hypertension 01/08/2020   Chronic kidney disease, stage 3a ([place]) 01/07/2020   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[place]) 12/28/2018   Insomnia due to medical condition 10/12/2018   Hyperthyroidism determined by thyroid function test 10/12/2018   Hypertension due to  endocrine disorder 10/12/2018   Tremor observed on examination 10/12/2018   Accelerated hypertension 09/27/2018   Hypo-osmolality and hyponatremia 09/11/2018   Impaired fasting glucose 09/11/2018   Migraine 09/11/2018   Chronic constipation 06/19/2018   Maxillary sinus cyst 12/14/2017   Referred otalgia of left ear 12/14/2017   Tinnitus of left ear 03/03/2017   Cramp in lower leg associated with rest 05/31/2016   IBS (irritable bowel syndrome) 03/12/2014   Personal history of colonic adenoma 02/12/2013   LUQ abdominal pain 01/23/2013   Chest pain 12/05/2012   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 Silvano Rusk   OOPHORECTOMY     spinal lipoma removed  2006   TOTAL ABDOMINAL HYSTERECTOMY       OB History   No obstetric history on file.     Family History  Problem Relation Age of Onset   Esophageal cancer Father    Stomach cancer Father        mets from esophagus  Heart disease Mother    Irritable bowel syndrome Mother    Kidney disease Mother    Other Mother        cushings disease   Addison's disease Mother    Heart failure Mother    Stroke Mother    Hypertension Mother    Stroke Maternal Grandmother    Hypertension Maternal Grandmother    Colon cancer Neg Hx    Heart attack Neg Hx     Social History   Tobacco Use   Smoking status: Former    Types: Cigarettes    Quit date: 01/23/1993     Years since quitting: 28.5   Smokeless tobacco: Never  Vaping Use   Vaping Use: Never used  Substance Use Topics   Alcohol use: Yes    Alcohol/week: 2.0 standard drinks    Types: 2 Glasses of wine per week   Drug use: No    Home Medications Prior to Admission medications   Medication Sig Start Date End Date Taking? Authorizing Provider  ALPRAZolam Duanne Moron) 0.5 MG tablet Take 0.25-0.5 mg by mouth 3 (three) times daily as needed for anxiety or sleep. Pt takes a full tab at night and whole a tab during day    [provider]  amLODipine (NORVASC) 5 MG tablet TAKE 1 TABLET BY MOUTH TWICE A DAY Patient taking differently: Take 5 mg by mouth daily.  04/29/20   Lelon Perla, MD  aspirin 81 MG tablet Take 81 mg by mouth daily.     [provider]  bacitracin ophthalmic ointment Place 1 application into both eyes daily as needed for wound care. Dry eyes 10/01/15   [provider]  cephALEXin (KEFLEX) 250 MG capsule Take 250 mg by mouth daily.  04/09/20   [provider]  COVID-19 mRNA Vac-TriS, Pfizer, (PFIZER-BIONT COVID-19 VAC-TRIS) SUSP injection Inject into the muscle. 04/22/21   Carlyle Basques, MD  estrogens, conjugated, (PREMARIN) 0.45 MG tablet Take 0.45 mg by mouth daily.     [provider]  gabapentin (NEURONTIN) 100 MG capsule Pt may take 1 capsule in the morning as needed and Always 1 capsule at bedtime Patient taking differently: Take 100 mg by mouth 3 (three) times daily.  06/26/20   Dohmeier, Asencion Partridge, MD  losartan (COZAAR) 50 MG tablet TAKE 2 TABLETS (100 MG TOTAL) BY MOUTH DAILY. KEEP UPCOMING APPOINTMENT FOR REFILLS Patient taking differently: Take 50 mg by mouth in the morning and at bedtime.  03/26/20   Lelon Perla, MD  Meth-Hyo-M Bl-Na Phos-Ph Sal (URIBEL) 118 MG CAPS Take 1 capsule by mouth daily as needed (For urine).  09/19/20   [provider]  metoprolol succinate (TOPROL-XL) 25 MG 24 hr tablet TAKE 1/2 TABLET BY  MOUTH EVERY DAY Patient taking differently: Take 12.5 mg by mouth daily.  09/12/20   Lelon Perla, MD  Multiple Vitamin (MULTIVITAMIN) capsule Take 1 capsule by mouth daily.    [provider]  Omega-3 Fatty Acids (FISH OIL PO) Take 1 tablet by mouth daily.    [provider]  polyethylene glycol (MIRALAX / GLYCOLAX) packet Take 17 g by mouth daily as needed for mild constipation.     [provider]  predniSONE (DELTASONE) 50 MG tablet CT pre-meds Take one tablet by mouth at 4AM on 08/28/2020, then take one tablet by mouth at 10:00am on 08/28/2020, then take one tablet by mouth at 4:00pm on 08/28/2020 08/21/20   Gatha Mayer, MD  senna-docusate (SENOKOT-S) 8.6-50  MG tablet Take 1 tablet by mouth at bedtime. 09/10/20   Lucrezia Starch, MD  SYNTHROID 75 MCG tablet Take 75 mcg by mouth daily.  03/15/18   [provider]    Allergies    Hydrocodone-acetaminophen, Iodinated diagnostic agents, Sulfa antibiotics, Codeine, Hydrocodone-acetaminophen, Sulfonamide derivatives, Terbinafine, and Benazepril  Review of Systems   Review of Systems  Constitutional:  Positive for fatigue. Negative for chills and fever.  HENT:  Negative for congestion.   Eyes:  Negative for visual disturbance.  Respiratory:  Negative for shortness of breath.   Cardiovascular:  Negative for chest pain.  Gastrointestinal:  Negative for abdominal pain and vomiting.  Genitourinary:  Negative for dysuria and flank pain.  Musculoskeletal:  Negative for back pain, neck pain and neck stiffness.  Skin:  Negative for rash.  Neurological:  Positive for weakness. Negative for light-headedness and headaches.  Psychiatric/Behavioral:  Positive for confusion.    Physical Exam Updated Vital Signs BP 138/64 (BP Location: Left Arm)   Pulse 62   Temp 98.2 F (36.8 C) (Oral)   Resp 16   Ht 5\' 4"  (1.626 m)   Wt 72.1 kg   SpO2 96%   BMI 27.29 kg/m   Physical Exam Vitals and nursing  note reviewed.  Constitutional:      General: She is not in acute distress.    Appearance: She is well-developed.  HENT:     Head: Normocephalic and atraumatic.     Nose: No congestion.     Mouth/Throat:     Mouth: Mucous membranes are dry.  Eyes:     General:        Right eye: No discharge.        Left eye: No discharge.     Conjunctiva/sclera: Conjunctivae normal.  Neck:     Trachea: No tracheal deviation.  Cardiovascular:     Rate and Rhythm: Normal rate and regular rhythm.     Heart sounds: No murmur heard. Pulmonary:     Effort: Pulmonary effort is normal.     Breath sounds: Normal breath sounds.  Abdominal:     General: There is no distension.     Palpations: Abdomen is soft.     Tenderness: There is no abdominal tenderness. There is no guarding.  Musculoskeletal:        General: No swelling or tenderness.     Cervical back: Normal range of motion and neck supple. No rigidity.  Skin:    General: Skin is warm.     Capillary Refill: Capillary refill takes less than 2 seconds.     Findings: No rash.  Neurological:     General: No focal deficit present.     Mental Status: She is alert.     Cranial Nerves: No cranial nerve deficit.     Comments: General weakness, no unilateral deficits, no facial droop, extraocular muscle function intact.,  No seizure activities    ED Results / Procedures / Treatments   Labs (all labs ordered are listed, but only abnormal results are displayed) Labs Reviewed  CBC WITH DIFFERENTIAL/PLATELET  COMPREHENSIVE METABOLIC PANEL  URINALYSIS, ROUTINE W REFLEX MICROSCOPIC    EKG None  Radiology No results found.  Procedures Procedures   Medications Ordered in ED Medications - No data to display  ED Course  I have reviewed the triage vital signs and the nursing notes.  Pertinent labs & imaging results that were available during my care of the patient were reviewed by me and considered  in my medical decision making (see chart for  details).    MDM Rules/Calculators/A&P                           Patient presents with worsening fatigue and general weakness and outpatient labs showing hyponatremia.  Reviewed medical records, unable to visualize results in patient's chart.  Blood work repeated and pending.  Small IV fluid bolus ordered, COVID test pending and urinalysis pending.  Differential includes decreased intake with nausea recently, medication side effect, SIADH, other.  Chest x-ray added.  Patient care signed out to follow-up results.  Final Clinical Impression(s) / ED Diagnoses Final diagnoses:  General weakness  Hyponatremia    Rx / DC Orders ED Discharge Orders     None        Elnora Morrison, MD 08/10/21 Benancio Deeds

## 2021-08-27 ENCOUNTER — Other Ambulatory Visit: Payer: Self-pay

## 2021-08-27 ENCOUNTER — Ambulatory Visit: Payer: Medicare PPO | Attending: Internal Medicine

## 2021-08-27 ENCOUNTER — Other Ambulatory Visit (HOSPITAL_BASED_OUTPATIENT_CLINIC_OR_DEPARTMENT_OTHER): Payer: Self-pay

## 2021-08-27 DIAGNOSIS — Z23 Encounter for immunization: Secondary | ICD-10-CM

## 2021-08-27 MED ORDER — PFIZER COVID-19 VAC BIVALENT 30 MCG/0.3ML IM SUSP
INTRAMUSCULAR | 0 refills | Status: DC
  Filled 2021-08-27: qty 0.3, 1d supply, fill #0

## 2021-08-27 NOTE — Progress Notes (Signed)
   Covid-19 Vaccination Clinic  Name:  MEHEK GREGA    MRN: [id number] DOB: 12-21-1940  08/27/2021  Ms. Millward was observed post Covid-19 immunization for 15 minutes without incident. She was provided with Vaccine Information Sheet and instruction to access the V-Safe system.   Ms. Penaloza was instructed to call 911 with any severe reactions post vaccine: Difficulty breathing  Swelling of face and throat  A fast heartbeat  A bad rash all over body  Dizziness and weakness   Immunizations Administered     Name Date Dose VIS Date Route   Pfizer Covid-19 Vaccine Bivalent Booster 08/27/2021  3:06 PM 0.3 mL 07/01/2021 Intramuscular   Manufacturer: [place]   Lot: [id number]   [place]: [phone]

## 2021-11-18 ENCOUNTER — Ambulatory Visit: Payer: Medicare PPO | Admitting: Adult Health

## 2021-11-18 NOTE — Progress Notes (Signed)
Patient no show appointment. ? ?

## 2021-12-02 ENCOUNTER — Ambulatory Visit (INDEPENDENT_AMBULATORY_CARE_PROVIDER_SITE_OTHER): Payer: Medicare PPO | Admitting: Adult Health

## 2021-12-02 ENCOUNTER — Encounter: Payer: Self-pay | Admitting: Adult Health

## 2021-12-02 ENCOUNTER — Other Ambulatory Visit: Payer: Self-pay

## 2021-12-02 DIAGNOSIS — G4700 Insomnia, unspecified: Secondary | ICD-10-CM

## 2021-12-02 DIAGNOSIS — F411 Generalized anxiety disorder: Secondary | ICD-10-CM | POA: Diagnosis not present

## 2021-12-02 DIAGNOSIS — F331 Major depressive disorder, recurrent, moderate: Secondary | ICD-10-CM | POA: Diagnosis not present

## 2021-12-02 NOTE — Progress Notes (Signed)
Debbie Wells [id number] [birth year] 80 y.o.  Subjective:   Patient ID:  Debbie Wells is a 81 y.o. (DOB 04-04-1941) female.  Chief Complaint: No chief complaint on file.   HPI Debbie [facility] presents to the office today for follow-up of GAD, MDD, and insomnia.  Describes mood today as "ok". Pleasant. Denies tearfulness. Mood symptoms - denies depression, anxiety, and irritability. Mood remains stable. Stating "I'm doing alright". Reports concerns about feeling tired and fatigued. Getting up in the mornings and taking her medications - then feeling fatigued and having to go back to bed. Having difficulties sleeping through the night - up and down to the bathroom. Stating "I feel very tired and I can't do the things I want to. Concerns about memory issues - plans to see neurologist. She and husband doing well. Stable interest and motivation. Taking medications as prescribed. Energy levels lower. Active, has a regular exercise routine.  Enjoys some usual interests and activities. Married. Lives with husband. Has 2 children - son in [place], and daughter in [place]. Spending time with family. Appetite adequate. Weight gain 160 to 163 pounds.  Sleeps well most nights. Averages 8.5 hours most nights. Focus and concentration improved. Completing tasks. Managing aspects of household. Retired. Denies SI or HI.  Denies AH or VH.  Previous medication trials: Unknown   Mini-Mental    Flowsheet Row Office Visit from 12/22/2020 in [facility]  Total Score (max 30 points ) 29      Flowsheet Row ED from 08/07/2021 in [facility] ED from 09/26/2020 in [facility] DEPT  C-SSRS RISK CATEGORY No Risk No Risk        Review of Systems:  Review of Systems  Musculoskeletal:  Negative for gait problem.  Neurological:  Negative for tremors.  Psychiatric/Behavioral:         Please refer to HPI   Medications: I have reviewed  the patient's current medications.  Current Outpatient Medications  Medication Sig Dispense Refill   ALPRAZolam (XANAX) 0.5 MG tablet Take 0.25-0.5 mg by mouth 3 (three) times daily as needed for anxiety or sleep. Pt takes a full tab at night and whole a tab during day     amLODipine (NORVASC) 5 MG tablet TAKE 1 TABLET BY MOUTH TWICE A DAY (Patient taking differently: Take 5 mg by mouth daily. ) 180 tablet 1   aspirin 81 MG tablet Take 81 mg by mouth daily.      bacitracin ophthalmic ointment Place 1 application into both eyes daily as needed for wound care. Dry eyes  0   cephALEXin (KEFLEX) 250 MG capsule Take 250 mg by mouth daily.      COVID-19 mRNA bivalent vaccine, Pfizer, (PFIZER COVID-19 VAC BIVALENT) injection Inject into the muscle. 0.3 mL 0   COVID-19 mRNA Vac-TriS, Pfizer, (PFIZER-BIONT COVID-19 VAC-TRIS) SUSP injection Inject into the muscle. 0.3 mL 0   estrogens, conjugated, (PREMARIN) 0.45 MG tablet Take 0.45 mg by mouth daily.      gabapentin (NEURONTIN) 100 MG capsule Pt may take 1 capsule in the morning as needed and Always 1 capsule at bedtime (Patient taking differently: Take 100 mg by mouth 3 (three) times daily. ) 180 capsule 1   losartan (COZAAR) 50 MG tablet TAKE 2 TABLETS (100 MG TOTAL) BY MOUTH DAILY. KEEP UPCOMING APPOINTMENT FOR REFILLS (Patient taking differently: Take 50 mg by mouth in the morning and at bedtime. ) 180 tablet 2   Meth-Hyo-M Bl-Na Phos-Ph Sal (  URIBEL) 118 MG CAPS Take 1 capsule by mouth daily as needed (For urine).      metoprolol succinate (TOPROL-XL) 25 MG 24 hr tablet TAKE 1/2 TABLET BY MOUTH EVERY DAY (Patient taking differently: Take 12.5 mg by mouth daily. ) 45 tablet 3   Multiple Vitamin (MULTIVITAMIN) capsule Take 1 capsule by mouth daily.     Omega-3 Fatty Acids (FISH OIL PO) Take 1 tablet by mouth daily.     polyethylene glycol (MIRALAX / GLYCOLAX) packet Take 17 g by mouth daily as needed for mild constipation.      predniSONE (DELTASONE) 50  MG tablet CT pre-meds Take one tablet by mouth at 4AM on 08/28/2020, then take one tablet by mouth at 10:00am on 08/28/2020, then take one tablet by mouth at 4:00pm on 08/28/2020 3 tablet 0   senna-docusate (SENOKOT-S) 8.6-50 MG tablet Take 1 tablet by mouth at bedtime. 30 tablet 0   SYNTHROID 75 MCG tablet Take 75 mcg by mouth daily.   1   No current facility-administered medications for this visit.    Medication Side Effects: None  Allergies:  Allergies  Allergen Reactions   Hydrocodone-Acetaminophen Rash    Vicodin=REACTION: rash   Iodinated Contrast Media Hives and Rash    Per patient she has developed hives and itching in upper trunk of body after last 2 CT scans once she got homes that she has just treated at home with benadryl. She has not told anyone until now. States she was able to bear it  Other reaction(s): Rash Per patient she has developed hives and itching in upper trunk of body after last 2 CT scans once she got homes that she has just treated at home with benadryl. She has not told anyone until now. States she was able to bear it    Sulfa Antibiotics Rash    REACTION: rash Other reaction(s): Unknown   Codeine     Other reaction(s): Unknown   Hydrocodone-Acetaminophen     Vicodin=REACTION: rash   Sulfonamide Derivatives     REACTION: rash   Terbinafine     Other reaction(s): Unknown   Benazepril Cough    Other reaction(s): Cough    Past Medical History:  Diagnosis Date   Adenomatous colon polyp    Adenomatous polyp of colon 12/03/2004   29mm   Anxiety    Chronic headaches    Chronic insomnia    Depression    Diverticulosis    External hemorrhoids    Hyperlipidemia    Hypertension    Hyperthyroidism    Hypothyroidism    Umbilical hernia     Past Medical History, Surgical history, Social history, and Family history were reviewed and updated as appropriate.   Please see review of systems for further details on the patient's review from today.    Objective:   Physical Exam:  There were no vitals taken for this visit.  Physical Exam Constitutional:      General: She is not in acute distress. Musculoskeletal:        General: No deformity.  Neurological:     Mental Status: She is alert and oriented to person, place, and time.     Coordination: Coordination normal.  Psychiatric:        Attention and Perception: Attention and perception normal. She does not perceive auditory or visual hallucinations.        Mood and Affect: Mood normal. Mood is not anxious or depressed. Affect is not labile, blunt, angry or inappropriate.  Speech: Speech normal.        Behavior: Behavior normal.        Thought Content: Thought content normal. Thought content is not paranoid or delusional. Thought content does not include homicidal or suicidal ideation. Thought content does not include homicidal or suicidal plan.        Cognition and Memory: Cognition and memory normal.        Judgment: Judgment normal.     Comments: Insight intact    Lab Review:     Component Value Date/Time   NA 134 (L) 08/07/2021 1430   NA 142 12/28/2018 1515   K 4.4 08/07/2021 1430   CL 99 08/07/2021 1430   CO2 27 08/07/2021 1430   GLUCOSE 91 08/07/2021 1430   BUN 16 08/07/2021 1430   BUN 10 12/28/2018 1515   CREATININE 0.91 08/07/2021 1430   CALCIUM 9.1 08/07/2021 1430   PROT 6.9 08/07/2021 1430   PROT 7.0 12/28/2018 1515   ALBUMIN 4.0 08/07/2021 1430   ALBUMIN 4.8 (H) 12/28/2018 1515   AST 21 08/07/2021 1430   ALT 16 08/07/2021 1430   ALKPHOS 55 08/07/2021 1430   BILITOT 0.4 08/07/2021 1430   BILITOT 0.3 12/28/2018 1515   GFRNONAA >60 08/07/2021 1430   GFRAA 75 12/28/2018 1515       Component Value Date/Time   WBC 5.2 08/07/2021 1430   RBC 3.99 08/07/2021 1430   HGB 12.0 08/07/2021 1430   HCT 36.2 08/07/2021 1430   PLT 221 08/07/2021 1430   MCV 90.7 08/07/2021 1430   MCH 30.1 08/07/2021 1430   MCHC 33.1 08/07/2021 1430   RDW 12.7  08/07/2021 1430   LYMPHSABS 1.2 08/07/2021 1430   MONOABS 0.8 08/07/2021 1430   EOSABS 0.3 08/07/2021 1430   BASOSABS 0.1 08/07/2021 1430    No results found for: POCLITH, LITHIUM   No results found for: PHENYTOIN, PHENOBARB, VALPROATE, CBMZ   .res Assessment: Plan:    Plan:  PDMP reviewed  Buspar 30mg   Xanax 0.5mg  TID - only taking twice daily Cymbalta 60mg  daily   Will request a U/A with frequent urination  Consider changing timing of medications  RTC 4 weeks   Time spent with patient was 25 minutes. Greater than 50% of face to face time with patient was spent on counseling and coordination of care.      Patient advised to contact office with any questions, adverse effects, or acute worsening in signs and symptoms.  Discussed potential benefits, risk, and side effects of benzodiazepines to include potential risk of tolerance and dependence, as well as possible drowsiness.  Advised patient not to drive if experiencing drowsiness and to take lowest possible effective dose to minimize risk of dependence and tolerance.  Discussed potential metabolic side effects associated with atypical antipsychotics, as well as potential risk for movement side effects. Advised pt to contact office if movement side effects occur.  Diagnoses and all orders for this visit:  Insomnia, unspecified type  Major depressive disorder, recurrent episode, moderate ([place])  Generalized anxiety disorder     Please see After Visit Summary for patient specific instructions.  No future appointments.   No orders of the defined types were placed in this encounter.   -------------------------------

## 2022-01-07 DIAGNOSIS — D692 Other nonthrombocytopenic purpura: Secondary | ICD-10-CM | POA: Diagnosis not present

## 2022-01-07 DIAGNOSIS — D0471 Carcinoma in situ of skin of right lower limb, including hip: Secondary | ICD-10-CM | POA: Diagnosis not present

## 2022-01-20 DIAGNOSIS — F331 Major depressive disorder, recurrent, moderate: Secondary | ICD-10-CM | POA: Diagnosis not present

## 2022-01-20 DIAGNOSIS — I700 Atherosclerosis of aorta: Secondary | ICD-10-CM | POA: Diagnosis not present

## 2022-01-20 DIAGNOSIS — E039 Hypothyroidism, unspecified: Secondary | ICD-10-CM | POA: Diagnosis not present

## 2022-01-20 DIAGNOSIS — E785 Hyperlipidemia, unspecified: Secondary | ICD-10-CM | POA: Diagnosis not present

## 2022-01-20 DIAGNOSIS — G43909 Migraine, unspecified, not intractable, without status migrainosus: Secondary | ICD-10-CM | POA: Diagnosis not present

## 2022-01-20 DIAGNOSIS — G4700 Insomnia, unspecified: Secondary | ICD-10-CM | POA: Diagnosis not present

## 2022-01-20 DIAGNOSIS — M8580 Other specified disorders of bone density and structure, unspecified site: Secondary | ICD-10-CM | POA: Diagnosis not present

## 2022-01-20 DIAGNOSIS — R7301 Impaired fasting glucose: Secondary | ICD-10-CM | POA: Diagnosis not present

## 2022-01-20 DIAGNOSIS — I129 Hypertensive chronic kidney disease with stage 1 through stage 4 chronic kidney disease, or unspecified chronic kidney disease: Secondary | ICD-10-CM | POA: Diagnosis not present

## 2022-01-25 DIAGNOSIS — R5383 Other fatigue: Secondary | ICD-10-CM | POA: Diagnosis not present

## 2022-01-25 DIAGNOSIS — Z20822 Contact with and (suspected) exposure to covid-19: Secondary | ICD-10-CM | POA: Diagnosis not present

## 2022-01-25 DIAGNOSIS — Z03818 Encounter for observation for suspected exposure to other biological agents ruled out: Secondary | ICD-10-CM | POA: Diagnosis not present

## 2022-01-25 DIAGNOSIS — J069 Acute upper respiratory infection, unspecified: Secondary | ICD-10-CM | POA: Diagnosis not present

## 2022-01-27 DIAGNOSIS — H66002 Acute suppurative otitis media without spontaneous rupture of ear drum, left ear: Secondary | ICD-10-CM | POA: Diagnosis not present

## 2022-01-27 DIAGNOSIS — I10 Essential (primary) hypertension: Secondary | ICD-10-CM | POA: Diagnosis not present

## 2022-01-27 DIAGNOSIS — Z20822 Contact with and (suspected) exposure to covid-19: Secondary | ICD-10-CM | POA: Diagnosis not present

## 2022-01-27 DIAGNOSIS — Z03818 Encounter for observation for suspected exposure to other biological agents ruled out: Secondary | ICD-10-CM | POA: Diagnosis not present

## 2022-02-15 ENCOUNTER — Emergency Department (HOSPITAL_BASED_OUTPATIENT_CLINIC_OR_DEPARTMENT_OTHER)
Admission: EM | Admit: 2022-02-15 | Discharge: 2022-02-15 | Disposition: A | Discharge status: Home / Self Care | Payer: Medicare PPO | Attending: Emergency Medicine | Admitting: Emergency Medicine

## 2022-02-15 ENCOUNTER — Emergency Department (HOSPITAL_BASED_OUTPATIENT_CLINIC_OR_DEPARTMENT_OTHER): Payer: Medicare PPO | Admitting: Radiology

## 2022-02-15 ENCOUNTER — Encounter (HOSPITAL_BASED_OUTPATIENT_CLINIC_OR_DEPARTMENT_OTHER): Payer: Self-pay

## 2022-02-15 ENCOUNTER — Emergency Department (HOSPITAL_BASED_OUTPATIENT_CLINIC_OR_DEPARTMENT_OTHER): Payer: Medicare PPO

## 2022-02-15 ENCOUNTER — Other Ambulatory Visit: Payer: Self-pay

## 2022-02-15 DIAGNOSIS — Z7982 Long term (current) use of aspirin: Secondary | ICD-10-CM | POA: Diagnosis not present

## 2022-02-15 DIAGNOSIS — W01198A Fall on same level from slipping, tripping and stumbling with subsequent striking against other object, initial encounter: Secondary | ICD-10-CM | POA: Insufficient documentation

## 2022-02-15 DIAGNOSIS — S60221A Contusion of right hand, initial encounter: Secondary | ICD-10-CM | POA: Diagnosis not present

## 2022-02-15 DIAGNOSIS — Y92481 Parking lot as the place of occurrence of the external cause: Secondary | ICD-10-CM | POA: Insufficient documentation

## 2022-02-15 DIAGNOSIS — S51011A Laceration without foreign body of right elbow, initial encounter: Secondary | ICD-10-CM | POA: Diagnosis not present

## 2022-02-15 DIAGNOSIS — Z79899 Other long term (current) drug therapy: Secondary | ICD-10-CM | POA: Diagnosis not present

## 2022-02-15 DIAGNOSIS — E039 Hypothyroidism, unspecified: Secondary | ICD-10-CM | POA: Insufficient documentation

## 2022-02-15 DIAGNOSIS — S62326A Displaced fracture of shaft of fifth metacarpal bone, right hand, initial encounter for closed fracture: Secondary | ICD-10-CM | POA: Diagnosis not present

## 2022-02-15 DIAGNOSIS — Z043 Encounter for examination and observation following other accident: Secondary | ICD-10-CM | POA: Diagnosis not present

## 2022-02-15 DIAGNOSIS — S0990XA Unspecified injury of head, initial encounter: Secondary | ICD-10-CM | POA: Diagnosis not present

## 2022-02-15 DIAGNOSIS — I10 Essential (primary) hypertension: Secondary | ICD-10-CM | POA: Diagnosis not present

## 2022-02-15 DIAGNOSIS — S62396A Other fracture of fifth metacarpal bone, right hand, initial encounter for closed fracture: Secondary | ICD-10-CM

## 2022-02-15 DIAGNOSIS — Z23 Encounter for immunization: Secondary | ICD-10-CM | POA: Diagnosis not present

## 2022-02-15 DIAGNOSIS — M25531 Pain in right wrist: Secondary | ICD-10-CM | POA: Diagnosis not present

## 2022-02-15 DIAGNOSIS — S59901A Unspecified injury of right elbow, initial encounter: Secondary | ICD-10-CM | POA: Diagnosis present

## 2022-02-15 DIAGNOSIS — Y9301 Activity, walking, marching and hiking: Secondary | ICD-10-CM | POA: Insufficient documentation

## 2022-02-15 DIAGNOSIS — Z87891 Personal history of nicotine dependence: Secondary | ICD-10-CM | POA: Diagnosis not present

## 2022-02-15 DIAGNOSIS — M47812 Spondylosis without myelopathy or radiculopathy, cervical region: Secondary | ICD-10-CM | POA: Diagnosis not present

## 2022-02-15 LAB — BASIC METABOLIC PANEL
Anion gap: 9 (ref 5–15)
BUN: 14 mg/dL (ref 8–23)
CO2: 25 mmol/L (ref 22–32)
Calcium: 10 mg/dL (ref 8.9–10.3)
Chloride: 98 mmol/L (ref 98–111)
Creatinine, Ser: 1.19 mg/dL — ABNORMAL HIGH (ref 0.44–1.00)
GFR, Estimated: 46 mL/min — ABNORMAL LOW (ref >60)
Glucose, Bld: 137 mg/dL — ABNORMAL HIGH (ref 70–99)
Potassium: 3.9 mmol/L (ref 3.5–5.1)
Sodium: 132 mmol/L — ABNORMAL LOW (ref 135–145)

## 2022-02-15 LAB — PROTIME-INR
INR: 1 (ref 0.8–1.2)
Prothrombin Time: 13.2 seconds (ref 11.4–15.2)

## 2022-02-15 LAB — CBC
HCT: 37.2 % (ref 36.0–46.0)
Hemoglobin: 12.3 g/dL (ref 12.0–15.0)
MCH: 29.5 pg (ref 26.0–34.0)
MCHC: 33.1 g/dL (ref 30.0–36.0)
MCV: 89.2 fL (ref 80.0–100.0)
Platelets: 258 10*3/uL (ref 150–400)
RBC: 4.17 MIL/uL (ref 3.87–5.11)
RDW: 13 % (ref 11.5–15.5)
WBC: 6.6 10*3/uL (ref 4.0–10.5)
nRBC: 0 % (ref 0.0–0.2)

## 2022-02-15 MED ORDER — SODIUM CHLORIDE 0.9 % IV BOLUS
1000.0000 mL | Freq: Once | INTRAVENOUS | Status: AC
  Administered 2022-02-15: 1000 mL via INTRAVENOUS

## 2022-02-15 MED ORDER — FENTANYL CITRATE PF 50 MCG/ML IJ SOSY
50.0000 ug | PREFILLED_SYRINGE | Freq: Once | INTRAMUSCULAR | Status: AC
  Administered 2022-02-15: 50 ug via INTRAVENOUS
  Filled 2022-02-15: qty 1

## 2022-02-15 MED ORDER — FENTANYL CITRATE PF 50 MCG/ML IJ SOSY
50.0000 ug | PREFILLED_SYRINGE | Freq: Once | INTRAMUSCULAR | Status: AC
  Administered 2022-02-15: 50 ug via INTRAVENOUS

## 2022-02-15 MED ORDER — ONDANSETRON HCL 4 MG/2ML IJ SOLN
4.0000 mg | Freq: Once | INTRAMUSCULAR | Status: AC
  Administered 2022-02-15: 4 mg via INTRAVENOUS
  Filled 2022-02-15: qty 2

## 2022-02-15 MED ORDER — OXYCODONE-ACETAMINOPHEN 5-325 MG PO TABS
1.0000 | ORAL_TABLET | Freq: Four times a day (QID) | ORAL | 0 refills | Status: AC | PRN
Start: 2022-02-15 — End: 2022-02-18

## 2022-02-15 MED ORDER — FENTANYL CITRATE PF 50 MCG/ML IJ SOSY
50.0000 ug | PREFILLED_SYRINGE | Freq: Once | INTRAMUSCULAR | Status: DC
  Filled 2022-02-15: qty 1

## 2022-02-15 MED ORDER — IBUPROFEN 600 MG PO TABS: 600.0000 mg | ORAL_TABLET | Freq: Four times a day (QID) | ORAL | 0 refills | Status: DC | PRN

## 2022-02-15 MED ORDER — ONDANSETRON HCL 4 MG/2ML IJ SOLN: 4.0000 mg | Freq: Once | INTRAMUSCULAR | Status: DC

## 2022-02-15 MED ORDER — TETANUS-DIPHTH-ACELL PERTUSSIS 5-2.5-18.5 LF-MCG/0.5 IM SUSY
0.5000 mL | PREFILLED_SYRINGE | Freq: Once | INTRAMUSCULAR | Status: AC
  Administered 2022-02-15: 0.5 mL via INTRAMUSCULAR
  Filled 2022-02-15: qty 0.5

## 2022-02-15 MED ORDER — OXYCODONE HCL 5 MG PO TABS
5.0000 mg | ORAL_TABLET | Freq: Once | ORAL | Status: AC
  Administered 2022-02-15: 5 mg via ORAL
  Filled 2022-02-15: qty 1

## 2022-02-15 MED ORDER — ACETAMINOPHEN 325 MG PO TABS
650.0000 mg | ORAL_TABLET | Freq: Once | ORAL | Status: AC
  Administered 2022-02-15: 650 mg via ORAL
  Filled 2022-02-15: qty 2

## 2022-02-15 NOTE — ED Provider Notes (Signed)
3:05 PM ?Patient signed out to me by previous ED physician. Patient is a 82 yo female presenting for mechanical fall. Trauma to right side of head, elbow, and chest. No loc. No blood thinners. ? ?Plan:  ?Images to DC ? ? ? ?Physical Exam  ?BP (!) 117/57   Pulse (!) 57   Temp 97.7 ?F (36.5 ?C) (Oral)   Resp 15   Ht '5\' 4"'$  (1.626 m)   Wt 70.3 kg   SpO2 97%   BMI 26.61 kg/m?  ? ?Physical Exam ?Vitals and nursing note reviewed.  ?Constitutional:   ?   General: She is not in acute distress. ?   Appearance: She is well-developed.  ?HENT:  ?   Head: Normocephalic and atraumatic.  ?Eyes:  ?   Conjunctiva/sclera: Conjunctivae normal.  ?Cardiovascular:  ?   Rate and Rhythm: Normal rate and regular rhythm.  ?   Heart sounds: No murmur heard. ?Pulmonary:  ?   Effort: Pulmonary effort is normal. No respiratory distress.  ?   Breath sounds: Normal breath sounds.  ?Chest:  ? ? ?Abdominal:  ?   Palpations: Abdomen is soft.  ?   Tenderness: There is no abdominal tenderness.  ?Musculoskeletal:     ?   General: No swelling.  ?   Cervical back: Neck supple.  ?Skin: ?   General: Skin is warm and dry.  ?   Capillary Refill: Capillary refill takes less than 2 seconds.  ?   Findings: Bruising and wound present.  ? ?    ?Neurological:  ?   Mental Status: She is alert.  ?Psychiatric:     ?   Mood and Affect: Mood normal.  ? ? ?Procedures  ?Marland Kitchen.Laceration Repair ? ?Date/Time: 02/15/2022 6:08 PM ?Performed by: Lianne Cure, DO ?Authorized by: Lianne Cure, DO  ? ?Consent:  ?  Consent obtained:  Verbal ?  Consent given by:  Patient ?  Risks, benefits, and alternatives were discussed: yes   ?Universal protocol:  ?  Immediately prior to procedure, a time out was called: yes   ?  Patient identity confirmed:  Verbally with patient, arm band and provided demographic data ?Anesthesia:  ?  Anesthesia method:  None ?Laceration details:  ?  Location:  Shoulder/arm ?  Shoulder/arm location:  R lower arm ?  Length (cm):  4 ?Exploration:  ?   Contaminated: no   ?Treatment:  ?  Amount of cleaning:  Standard ?  Irrigation solution:  Sterile saline ?Skin repair:  ?  Repair method:  Steri-Strips ?  Number of Steri-Strips:  7 ?Approximation:  ?  Approximation:  Loose ?Repair type:  ?  Repair type:  Simple ?Post-procedure details:  ?  Dressing:  Tube gauze ?  Procedure completion:  Tolerated well, no immediate complications ? ?ED Course / MDM  ?  ?Medical Decision Making ?Amount and/or Complexity of Data Reviewed ?Labs: ordered. ?Radiology: ordered. ? ?Risk ?OTC drugs. ?Prescription drug management. ? ? ?6:06 PM ?CT head and neck demonstrates no acute process. ? ?Tenderness to palpation of right ribs.  Dedicated right rib x-ray demonstrates no acute process. ? ?Ecchymosis and swelling at distal end of fifth metacarpal.  X-ray demonstrates mild displaced fracture.  Ulnar gutter splint placed with recommendations for close follow-up with the orthopedic surgery. ? ?Wound care and Steri-Strips for right skin tear.  Signs and symptoms of infection described in detail with recommendations for close follow-up with primary care physician if they develop. ? ?Tdap administered. ? ?Patient  is completely alert and oriented at this time.Patient in no distress and overall condition improved here in the ED. Detailed discussions were had with the patient regarding current findings, and need for close f/u with PCP or on call doctor. The patient has been instructed to return immediately if the symptoms worsen in any way for re-evaluation. Patient verbalized understanding and is in agreement with current care plan. All questions answered prior to discharge. ? ?Discharge in the care of her husband and daughter. ? ? ? ? ? ?  ?Lianne Cure, DO ?79/39/03 [id number] ? ?

## 2022-02-15 NOTE — Discharge Instructions (Addendum)
Based on the events which brought you to the ER today, it is possible that you may have a concussion. A concussion occurs when there is a blow to the head or body, with enough force to shake the brain and disrupt how the brain functions. You may experience symptoms such as headaches, sensitivity to light/noise, dizziness, cognitive slowing, difficulty concentrating / remembering, trouble sleeping and drowsiness. These symptoms may last anywhere from hours/days to potentially weeks/months. While these symptoms are very frustrating and perhaps debilitating, it is important that you remember that they will improve over time. Everyone has a different rate of recovery; it is difficult to predict when your symptoms will resolve. In order to allow for your brain to heal after the injury, we recommend that you see your primary physician or a physician knowledgeable in concussion management. We also advise you to let your body and brain rest: avoid physical activities (sports, gym, and exercise) and reduce cognitive demands (reading, texting, TV watching, computer use, video games, etc). School attendance, after-school activities and work may need to be modified to avoid increasing symptoms. We recommend against driving until until all symptoms have resolved. Come back to the ER right away if you are having repeated episodes of vomiting, severe/worsening headache/dizziness or any other symptom that alarms you. We recommended that someone stay with you for the next 24 hours to monitor for these worrisome symptoms.  

## 2022-02-15 NOTE — ED Provider Notes (Signed)
?[facility] ?Provider Note ? ? ?CSN: [id number] ?Arrival date & time: 02/15/22  1410 ? ?  ? ?History ? ?Chief Complaint  ?Patient presents with  ? Fall  ? Hypotension  ? ? ?Debbie Wells is a 81 y.o. female. ? ? Patient as above with significant medical history as below, including hypertension, hyperlipidemia, chronic insomnia, diverticulosis who presents to the ED with complaint of fall. ? ?Patient reports she was walking in a parking lot, tripped over the edge of a curb and fell forwards onto the right side of her body.  Struck her head on the ground.  Right forearm, right side chest, right hip.  She was able to get up after the fall with assistance.  No thinners.  No LOC, no dyspnea, chest pain, vomiting.  No incontinence.  No seizure-like to was observed by bystanders./Family.  She is having some mild nausea without emesis. ?Last tetanus shot was greater than 5 years ago ? ? ? ?Past Medical History: ?No date: Adenomatous colon polyp ?12/03/2004: Adenomatous polyp of colon ?    Comment:  29m ?No date: Anxiety ?No date: Chronic headaches ?No date: Chronic insomnia ?No date: Depression ?No date: Diverticulosis ?No date: External hemorrhoids ?No date: Hyperlipidemia ?No date: Hypertension ?No date: Hyperthyroidism ?No date: Hypothyroidism ?No date: Umbilical hernia ? ?Past Surgical History: ?1975: ABDOMINAL HYSTERECTOMY ?1975: APPENDECTOMY ?03/03/2010,12/03/2004: COLONOSCOPY ?12/16/11: ESOPHAGOGASTRODUODENOSCOPY ?    Comment:  Dr. CSilvano Rusk?No date: OOPHORECTOMY ?2006: spinal lipoma removed ?No date: TOTAL ABDOMINAL HYSTERECTOMY  ? ? ?The history is provided by the patient, the spouse and a relative. No language interpreter was used.  ?Fall ?Associated symptoms include headaches. Pertinent negatives include no chest pain, no abdominal pain and no shortness of breath.  ? ?  ? ?Home Medications ?Prior to Admission medications   ?Medication Sig Start Date End Date Taking? Authorizing Provider   ?ALPRAZolam (XANAX) 0.5 MG tablet Take 0.25-0.5 mg by mouth 3 (three) times daily as needed for anxiety or sleep. Pt takes a full tab at night and whole a tab during day    [provider]  ?amLODipine (NORVASC) 5 MG tablet TAKE 1 TABLET BY MOUTH TWICE A DAY ?Patient taking differently: Take 5 mg by mouth daily.  04/29/20   CLelon Perla MD  ?aspirin 81 MG tablet Take 81 mg by mouth daily.     [provider]  ?bacitracin ophthalmic ointment Place 1 application into both eyes daily as needed for wound care. Dry eyes 10/01/15   [provider]  ?cephALEXin (KEFLEX) 250 MG capsule Take 250 mg by mouth daily.  04/09/20   [provider]  ?COVID-19 mRNA bivalent vaccine, Pfizer, (PFIZER COVID-19 VAC BIVALENT) injection Inject into the muscle. 08/27/21   SCarlyle Basques MD  ?COVID-19 mRNA Vac-TriS, Pfizer, (PFIZER-BIONT COVID-19 VAC-TRIS) SUSP injection Inject into the muscle. 04/22/21   SCarlyle Basques MD  ?estrogens, conjugated, (PREMARIN) 0.45 MG tablet Take 0.45 mg by mouth daily.     [provider]  ?gabapentin (NEURONTIN) 100 MG capsule Pt may take 1 capsule in the morning as needed and Always 1 capsule at bedtime ?Patient taking differently: Take 100 mg by mouth 3 (three) times daily.  06/26/20   Dohmeier, CAsencion Partridge MD  ?losartan (COZAAR) 50 MG tablet TAKE 2 TABLETS (100 MG TOTAL) BY MOUTH DAILY. KEEP UPCOMING APPOINTMENT FOR REFILLS ?Patient taking differently: Take 50 mg by mouth in the morning and at bedtime.  03/26/20   CLelon Perla MD  ?  Meth-Hyo-M Bl-Na Phos-Ph Sal (URIBEL) 118 MG CAPS Take 1 capsule by mouth daily as needed (For urine).  09/19/20   [provider]  ?metoprolol succinate (TOPROL-XL) 25 MG 24 hr tablet TAKE 1/2 TABLET BY MOUTH EVERY DAY ?Patient taking differently: Take 12.5 mg by mouth daily.  09/12/20   Lelon Perla, MD  ?Multiple Vitamin (MULTIVITAMIN) capsule Take 1 capsule by mouth daily.    [provider]   ?Omega-3 Fatty Acids (FISH OIL PO) Take 1 tablet by mouth daily.    [provider]  ?polyethylene glycol (MIRALAX / GLYCOLAX) packet Take 17 g by mouth daily as needed for mild constipation.     [provider]  ?predniSONE (DELTASONE) 50 MG tablet CT pre-meds ?Take one tablet by mouth at 4AM on 08/28/2020, then take one tablet by mouth at 10:00am on 08/28/2020, then take one tablet by mouth at 4:00pm on 08/28/2020 08/21/20   Gatha Mayer, MD  ?senna-docusate (SENOKOT-S) 8.6-50 MG tablet Take 1 tablet by mouth at bedtime. 09/10/20   Lucrezia Starch, MD  ?SYNTHROID 75 MCG tablet Take 75 mcg by mouth daily.  03/15/18   [provider]  ?   ? ?Allergies    ?Hydrocodone-acetaminophen, Iodinated contrast media, Sulfa antibiotics, Codeine, Hydrocodone-acetaminophen, Sulfonamide derivatives, Terbinafine, and Benazepril   ? ?Review of Systems   ?Review of Systems  ?Constitutional:  Negative for chills and fever.  ?HENT:  Negative for facial swelling and trouble swallowing.   ?Eyes:  Negative for photophobia and visual disturbance.  ?Respiratory:  Negative for cough and shortness of breath.   ?Cardiovascular:  Negative for chest pain and palpitations.  ?Gastrointestinal:  Positive for nausea. Negative for abdominal pain and vomiting.  ?Endocrine: Negative for polydipsia and polyuria.  ?Genitourinary:  Negative for difficulty urinating and hematuria.  ?Musculoskeletal:  Positive for arthralgias. Negative for gait problem and joint swelling.  ?Skin:  Positive for wound. Negative for pallor and rash.  ?Neurological:  Positive for headaches. Negative for syncope.  ?Psychiatric/Behavioral:  Negative for agitation and confusion.   ? ?Physical Exam ?Updated Vital Signs ?BP (!) 117/57   Pulse (!) 57   Temp 97.7 ?F (36.5 ?C) (Oral)   Resp 15   Ht '5\' 4"'$  (1.626 m)   Wt 70.3 kg   SpO2 97%   BMI 26.61 kg/m?  ?Physical Exam ?Vitals and nursing note reviewed.  ?Constitutional:   ?   General: She is  not in acute distress. ?   Appearance: Normal appearance.  ?HENT:  ?   Head: Normocephalic and atraumatic. No raccoon eyes, Battle's sign, right periorbital erythema or left periorbital erythema.  ?   Jaw: There is normal jaw occlusion. No trismus.  ?   Right Ear: External ear normal.  ?   Left Ear: External ear normal.  ?   Nose: Nose normal.  ?   Mouth/Throat:  ?   Mouth: Mucous membranes are moist.  ?Eyes:  ?   General: No scleral icterus.    ?   Right eye: No discharge.     ?   Left eye: No discharge.  ?   Extraocular Movements: Extraocular movements intact.  ?   Pupils: Pupils are equal, round, and reactive to light.  ?Cardiovascular:  ?   Rate and Rhythm: Normal rate and regular rhythm.  ?   Pulses: Normal pulses.  ?   Heart sounds: Normal heart sounds.  ?Pulmonary:  ?   Effort: Pulmonary effort is normal. No respiratory distress.  ?  Breath sounds: Normal breath sounds.  ?Abdominal:  ?   General: Abdomen is flat.  ?   Tenderness: There is no abdominal tenderness.  ?Musculoskeletal:     ?   General: Normal range of motion.  ?   Cervical back: Normal range of motion.  ?   Right lower leg: No edema.  ?   Left lower leg: No edema.  ?   Comments: No midline spinous process tenderness to palpation or percussion, no crepitus or step-off.   ? ?Pelvis stable to AP pressure ?  ?Skin: ?   General: Skin is warm and dry.  ?   Capillary Refill: Capillary refill takes less than 2 seconds.  ? ?    ?Neurological:  ?   Mental Status: She is alert and oriented to person, place, and time.  ?   GCS: GCS eye subscore is 4. GCS verbal subscore is 5. GCS motor subscore is 6.  ?   Cranial Nerves: Cranial nerves 2-12 are intact. No dysarthria.  ?   Sensory: Sensation is intact.  ?   Motor: Motor function is intact. No tremor.  ?   Coordination: Coordination is intact.  ?Psychiatric:     ?   Mood and Affect: Mood normal.     ?   Behavior: Behavior normal.  ? ? ?ED Results / Procedures / Treatments   ?Labs ?(all labs ordered are  listed, but only abnormal results are displayed) ?Labs Reviewed  ?CBC  ?PROTIME-INR  ?BASIC METABOLIC PANEL  ? ? ?EKG ?EKG Interpretation ? ?Date/Time:  Monday February 15 2022 14:52:26 EDT ?Ventricular Rate:  59 ?PR Inter

## 2022-02-15 NOTE — ED Triage Notes (Addendum)
Pt to ED via POV c/o fall. Mechanical fall. Reports was leaving the restaurant, attempted to get back into vehicle when she tripped on the curb and fell on the pavement. Skin tear to right forearm. Reports pain on right side/rib area. Pt reports when she fell she did hit head, but no LOC. Does report feeling nausea in triage .BP noted to be 72/44 in triage, pt reports hx HTN and took bp this morning. Reports does not take blood thinners. ?

## 2022-02-15 NOTE — ED Notes (Signed)
Patient transported to CT 

## 2022-02-15 NOTE — ED Notes (Signed)
EMT at bedside placing a splint on ?

## 2022-02-19 DIAGNOSIS — S62316A Displaced fracture of base of fifth metacarpal bone, right hand, initial encounter for closed fracture: Secondary | ICD-10-CM | POA: Diagnosis not present

## 2022-02-24 DIAGNOSIS — R300 Dysuria: Secondary | ICD-10-CM | POA: Diagnosis not present

## 2022-02-24 DIAGNOSIS — F331 Major depressive disorder, recurrent, moderate: Secondary | ICD-10-CM | POA: Diagnosis not present

## 2022-02-24 DIAGNOSIS — E039 Hypothyroidism, unspecified: Secondary | ICD-10-CM | POA: Diagnosis not present

## 2022-02-24 DIAGNOSIS — I700 Atherosclerosis of aorta: Secondary | ICD-10-CM | POA: Diagnosis not present

## 2022-02-24 DIAGNOSIS — E785 Hyperlipidemia, unspecified: Secondary | ICD-10-CM | POA: Diagnosis not present

## 2022-02-24 DIAGNOSIS — I129 Hypertensive chronic kidney disease with stage 1 through stage 4 chronic kidney disease, or unspecified chronic kidney disease: Secondary | ICD-10-CM | POA: Diagnosis not present

## 2022-02-24 DIAGNOSIS — F419 Anxiety disorder, unspecified: Secondary | ICD-10-CM | POA: Diagnosis not present

## 2022-02-24 DIAGNOSIS — N1831 Chronic kidney disease, stage 3a: Secondary | ICD-10-CM | POA: Diagnosis not present

## 2022-02-24 DIAGNOSIS — R7301 Impaired fasting glucose: Secondary | ICD-10-CM | POA: Diagnosis not present

## 2022-02-26 DIAGNOSIS — S62316A Displaced fracture of base of fifth metacarpal bone, right hand, initial encounter for closed fracture: Secondary | ICD-10-CM | POA: Diagnosis not present

## 2022-03-03 DIAGNOSIS — N390 Urinary tract infection, site not specified: Secondary | ICD-10-CM | POA: Diagnosis not present

## 2022-03-03 DIAGNOSIS — R52 Pain, unspecified: Secondary | ICD-10-CM | POA: Diagnosis not present

## 2022-03-16 DIAGNOSIS — S62316A Displaced fracture of base of fifth metacarpal bone, right hand, initial encounter for closed fracture: Secondary | ICD-10-CM | POA: Diagnosis not present

## 2022-04-06 DIAGNOSIS — S62316D Displaced fracture of base of fifth metacarpal bone, right hand, subsequent encounter for fracture with routine healing: Secondary | ICD-10-CM | POA: Diagnosis not present

## 2022-04-21 ENCOUNTER — Ambulatory Visit (INDEPENDENT_AMBULATORY_CARE_PROVIDER_SITE_OTHER): Payer: Medicare PPO | Admitting: Adult Health

## 2022-04-21 ENCOUNTER — Encounter: Payer: Self-pay | Admitting: Adult Health

## 2022-04-21 DIAGNOSIS — F331 Major depressive disorder, recurrent, moderate: Secondary | ICD-10-CM

## 2022-04-21 DIAGNOSIS — F411 Generalized anxiety disorder: Secondary | ICD-10-CM

## 2022-04-21 DIAGNOSIS — G4700 Insomnia, unspecified: Secondary | ICD-10-CM

## 2022-04-21 MED ORDER — DULOXETINE HCL 30 MG PO CPEP: ORAL_CAPSULE | ORAL | 0 refills | Status: DC

## 2022-04-21 MED ORDER — BUPROPION HCL ER (XL) 150 MG PO TB24: 150.0000 mg | ORAL_TABLET | Freq: Every morning | ORAL | 5 refills | Status: DC

## 2022-04-21 NOTE — Progress Notes (Signed)
[name] [id number] [birth year] 81 y.o.  Subjective:   Patient ID:  Debbie Wells is a 81 y.o. (DOB Sep 13, 1941) female.  Chief Complaint: No chief complaint on file.   HPI Debbie Wells presents to the office today for follow-up of GAD, MDD, and insomnia.  Describes mood today as "ok". Pleasant. Denies tearfulness. Mood symptoms - reports some depression and irritability - reports 3 nights a week where she feels down in the evenings and then it passes. Stating "I'm not doing as well as I was". Mood is lower. Concerned about weight gain - continues to gain and is unable to lose weight. Would like to consider other options that may help with weight loss. Having difficulties sleeping through the night - up and down to the bathroom. Concerns about memory issues. She and husband doing well. Stable interest and motivation. Taking medications as prescribed. Energy levels lower. Active, has a regular exercise routine - aerobics Enjoys some usual interests and activities. Married. Lives with husband. Has 2 children - son in [place], and daughter in [place]. Spending time with family. Appetite adequate. Weight gain 160 to 163 pounds.  Sleeps well most nights. Averages 7 hours most nights - reports a short nap some days. Focus and concentration improved. Completing tasks. Managing aspects of household. Retired. Denies SI or HI.  Denies AH or VH.  Previous medication trials: Unknown    Mini-Mental    Flowsheet Row Office Visit from 12/22/2020 in [facility]  Total Score (max 30 points ) 29      Flowsheet Row ED from 02/15/2022 in [facility] ED from 08/07/2021 in [facility] ED from 09/26/2020 in [facility]  C-SSRS RISK CATEGORY No Risk No Risk No Risk        Review of Systems:  Review of Systems  Musculoskeletal:  Negative for gait problem.  Neurological:  Negative for  tremors.  Psychiatric/Behavioral:         Please refer to HPI    Medications: I have reviewed the patient's current medications.  Current Outpatient Medications  Medication Sig Dispense Refill   ALPRAZolam (XANAX) 0.5 MG tablet Take 0.25-0.5 mg by mouth 3 (three) times daily as needed for anxiety or sleep. Pt takes a full tab at night and whole a tab during day     amLODipine (NORVASC) 5 MG tablet TAKE 1 TABLET BY MOUTH TWICE A DAY (Patient taking differently: Take 5 mg by mouth daily. ) 180 tablet 1   aspirin 81 MG tablet Take 81 mg by mouth daily.      bacitracin ophthalmic ointment Place 1 application into both eyes daily as needed for wound care. Dry eyes  0   cephALEXin (KEFLEX) 250 MG capsule Take 250 mg by mouth daily.      COVID-19 mRNA bivalent vaccine, Pfizer, (PFIZER COVID-19 VAC BIVALENT) injection Inject into the muscle. 0.3 mL 0   COVID-19 mRNA Vac-TriS, Pfizer, (PFIZER-BIONT COVID-19 VAC-TRIS) SUSP injection Inject into the muscle. 0.3 mL 0   estrogens, conjugated, (PREMARIN) 0.45 MG tablet Take 0.45 mg by mouth daily.      gabapentin (NEURONTIN) 100 MG capsule Pt may take 1 capsule in the morning as needed and Always 1 capsule at bedtime (Patient taking differently: Take 100 mg by mouth 3 (three) times daily. ) 180 capsule 1   ibuprofen (ADVIL) 600 MG tablet Take 1 tablet (600 mg total) by mouth every 6 (six) hours as needed. 30 tablet  0   losartan (COZAAR) 50 MG tablet TAKE 2 TABLETS (100 MG TOTAL) BY MOUTH DAILY. KEEP UPCOMING APPOINTMENT FOR REFILLS (Patient taking differently: Take 50 mg by mouth in the morning and at bedtime. ) 180 tablet 2   Meth-Hyo-M Bl-Na Phos-Ph Sal (URIBEL) 118 MG CAPS Take 1 capsule by mouth daily as needed (For urine).      metoprolol succinate (TOPROL-XL) 25 MG 24 hr tablet TAKE 1/2 TABLET BY MOUTH EVERY DAY (Patient taking differently: Take 12.5 mg by mouth daily. ) 45 tablet 3   Multiple Vitamin (MULTIVITAMIN) capsule Take 1 capsule by mouth  daily.     Omega-3 Fatty Acids (FISH OIL PO) Take 1 tablet by mouth daily.     polyethylene glycol (MIRALAX / GLYCOLAX) packet Take 17 g by mouth daily as needed for mild constipation.      predniSONE (DELTASONE) 50 MG tablet CT pre-meds Take one tablet by mouth at 4AM on 08/28/2020, then take one tablet by mouth at 10:00am on 08/28/2020, then take one tablet by mouth at 4:00pm on 08/28/2020 3 tablet 0   senna-docusate (SENOKOT-S) 8.6-50 MG tablet Take 1 tablet by mouth at bedtime. 30 tablet 0   SYNTHROID 75 MCG tablet Take 75 mcg by mouth daily.   1   No current facility-administered medications for this visit.    Medication Side Effects: None  Allergies:  Allergies  Allergen Reactions   Hydrocodone-Acetaminophen Rash    Vicodin=REACTION: rash   Iodinated Contrast Media Hives and Rash    Per patient she has developed hives and itching in upper trunk of body after last 2 CT scans once she got homes that she has just treated at home with benadryl. She has not told anyone until now. States she was able to bear it  Other reaction(s): Rash Per patient she has developed hives and itching in upper trunk of body after last 2 CT scans once she got homes that she has just treated at home with benadryl. She has not told anyone until now. States she was able to bear it    Sulfa Antibiotics Rash    REACTION: rash Other reaction(s): Unknown   Codeine     Other reaction(s): Unknown   Hydrocodone-Acetaminophen     Vicodin=REACTION: rash   Sulfonamide Derivatives     REACTION: rash   Terbinafine     Other reaction(s): Unknown   Benazepril Cough    Other reaction(s): Cough    Past Medical History:  Diagnosis Date   Adenomatous colon polyp    Adenomatous polyp of colon 12/03/2004   64m   Anxiety    Chronic headaches    Chronic insomnia    Depression    Diverticulosis    External hemorrhoids    Hyperlipidemia    Hypertension    Hyperthyroidism    Hypothyroidism    Umbilical hernia      Past Medical History, Surgical history, Social history, and Family history were reviewed and updated as appropriate.   Please see review of systems for further details on the patient's review from today.   Objective:   Physical Exam:  There were no vitals taken for this visit.  Physical Exam  Lab Review:     Component Value Date/Time   NA 132 (L) 02/15/2022 1452   NA 142 12/28/2018 1515   K 3.9 02/15/2022 1452   CL 98 02/15/2022 1452   CO2 25 02/15/2022 1452   GLUCOSE 137 (H) 02/15/2022 1452   BUN 14 02/15/2022  1452   BUN 10 12/28/2018 1515   CREATININE 1.19 (H) 02/15/2022 1452   CALCIUM 10.0 02/15/2022 1452   PROT 6.9 08/07/2021 1430   PROT 7.0 12/28/2018 1515   ALBUMIN 4.0 08/07/2021 1430   ALBUMIN 4.8 (H) 12/28/2018 1515   AST 21 08/07/2021 1430   ALT 16 08/07/2021 1430   ALKPHOS 55 08/07/2021 1430   BILITOT 0.4 08/07/2021 1430   BILITOT 0.3 12/28/2018 1515   GFRNONAA 46 (L) 02/15/2022 1452   GFRAA 75 12/28/2018 1515       Component Value Date/Time   WBC 6.6 02/15/2022 1452   RBC 4.17 02/15/2022 1452   HGB 12.3 02/15/2022 1452   HCT 37.2 02/15/2022 1452   PLT 258 02/15/2022 1452   MCV 89.2 02/15/2022 1452   MCH 29.5 02/15/2022 1452   MCHC 33.1 02/15/2022 1452   RDW 13.0 02/15/2022 1452   LYMPHSABS 1.2 08/07/2021 1430   MONOABS 0.8 08/07/2021 1430   EOSABS 0.3 08/07/2021 1430   BASOSABS 0.1 08/07/2021 1430    No results found for: "POCLITH", "LITHIUM"   No results found for: "PHENYTOIN", "PHENOBARB", "VALPROATE", "CBMZ"   .res Assessment: Plan:    Plan:  PDMP reviewed  Buspar '30mg'$   Xanax 0.'5mg'$  TID - only taking twice daily Decrease Cymbalta '60mg'$  to '30mg'$  daily  Add Wellburin XL '150mg'$  every morning - denies seizure history.  Consider Trazadone for sleep next visit.  RTC 4 weeks   Time spent with patient was 25 minutes. Greater than 50% of face to face time with patient was spent on counseling and coordination of care.      Patient  advised to contact office with any questions, adverse effects, or acute worsening in signs and symptoms.  Discussed potential benefits, risk, and side effects of benzodiazepines to include potential risk of tolerance and dependence, as well as possible drowsiness.  Advised patient not to drive if experiencing drowsiness and to take lowest possible effective dose to minimize risk of dependence and tolerance.  Discussed potential metabolic side effects associated with atypical antipsychotics, as well as potential risk for movement side effects. Advised pt to contact office if movement side effects occur.  There are no diagnoses linked to this encounter.   Please see After Visit Summary for patient specific instructions.  Future Appointments  Date Time Provider [place]  04/21/2022 11:20 AM Maigan Bittinger, Berdie Ogren, NP [facility] None    No orders of the defined types were placed in this encounter.   -------------------------------

## 2022-05-11 DIAGNOSIS — S62316D Displaced fracture of base of fifth metacarpal bone, right hand, subsequent encounter for fracture with routine healing: Secondary | ICD-10-CM | POA: Diagnosis not present

## 2022-05-19 ENCOUNTER — Ambulatory Visit (INDEPENDENT_AMBULATORY_CARE_PROVIDER_SITE_OTHER): Payer: Self-pay | Admitting: Adult Health

## 2022-05-19 ENCOUNTER — Encounter: Payer: Self-pay | Admitting: Adult Health

## 2022-05-19 DIAGNOSIS — H04123 Dry eye syndrome of bilateral lacrimal glands: Secondary | ICD-10-CM | POA: Diagnosis not present

## 2022-05-19 DIAGNOSIS — F489 Nonpsychotic mental disorder, unspecified: Secondary | ICD-10-CM

## 2022-05-19 DIAGNOSIS — H0102B Squamous blepharitis left eye, upper and lower eyelids: Secondary | ICD-10-CM | POA: Diagnosis not present

## 2022-05-19 DIAGNOSIS — H0102A Squamous blepharitis right eye, upper and lower eyelids: Secondary | ICD-10-CM | POA: Diagnosis not present

## 2022-05-19 NOTE — Progress Notes (Signed)
Patient no show appointment. ? ?

## 2022-05-28 DIAGNOSIS — E039 Hypothyroidism, unspecified: Secondary | ICD-10-CM | POA: Diagnosis not present

## 2022-05-28 DIAGNOSIS — I10 Essential (primary) hypertension: Secondary | ICD-10-CM | POA: Diagnosis not present

## 2022-05-28 DIAGNOSIS — F419 Anxiety disorder, unspecified: Secondary | ICD-10-CM | POA: Diagnosis not present

## 2022-05-28 DIAGNOSIS — E785 Hyperlipidemia, unspecified: Secondary | ICD-10-CM | POA: Diagnosis not present

## 2022-05-28 DIAGNOSIS — R7989 Other specified abnormal findings of blood chemistry: Secondary | ICD-10-CM | POA: Diagnosis not present

## 2022-06-02 DIAGNOSIS — G629 Polyneuropathy, unspecified: Secondary | ICD-10-CM | POA: Diagnosis not present

## 2022-06-02 DIAGNOSIS — I129 Hypertensive chronic kidney disease with stage 1 through stage 4 chronic kidney disease, or unspecified chronic kidney disease: Secondary | ICD-10-CM | POA: Diagnosis not present

## 2022-06-02 DIAGNOSIS — Z604 Social exclusion and rejection: Secondary | ICD-10-CM | POA: Diagnosis not present

## 2022-06-02 DIAGNOSIS — N182 Chronic kidney disease, stage 2 (mild): Secondary | ICD-10-CM | POA: Diagnosis not present

## 2022-06-02 DIAGNOSIS — K5900 Constipation, unspecified: Secondary | ICD-10-CM | POA: Diagnosis not present

## 2022-06-02 DIAGNOSIS — E039 Hypothyroidism, unspecified: Secondary | ICD-10-CM | POA: Diagnosis not present

## 2022-06-02 DIAGNOSIS — R32 Unspecified urinary incontinence: Secondary | ICD-10-CM | POA: Diagnosis not present

## 2022-06-02 DIAGNOSIS — F324 Major depressive disorder, single episode, in partial remission: Secondary | ICD-10-CM | POA: Diagnosis not present

## 2022-06-02 DIAGNOSIS — F411 Generalized anxiety disorder: Secondary | ICD-10-CM | POA: Diagnosis not present

## 2022-06-10 DIAGNOSIS — S62316D Displaced fracture of base of fifth metacarpal bone, right hand, subsequent encounter for fracture with routine healing: Secondary | ICD-10-CM | POA: Diagnosis not present

## 2022-06-21 DIAGNOSIS — M79641 Pain in right hand: Secondary | ICD-10-CM | POA: Diagnosis not present

## 2022-06-28 DIAGNOSIS — M79641 Pain in right hand: Secondary | ICD-10-CM | POA: Diagnosis not present

## 2022-06-30 DIAGNOSIS — H04123 Dry eye syndrome of bilateral lacrimal glands: Secondary | ICD-10-CM | POA: Diagnosis not present

## 2022-06-30 DIAGNOSIS — H0102A Squamous blepharitis right eye, upper and lower eyelids: Secondary | ICD-10-CM | POA: Diagnosis not present

## 2022-06-30 DIAGNOSIS — H0102B Squamous blepharitis left eye, upper and lower eyelids: Secondary | ICD-10-CM | POA: Diagnosis not present

## 2022-07-07 DIAGNOSIS — M79641 Pain in right hand: Secondary | ICD-10-CM | POA: Diagnosis not present

## 2022-07-08 IMAGING — CT CT ABD-PELV W/O CM
2 of 4 series · 17 of 46 positions shown, 19 images · non-contrast
Comparison: 09/10/2020

CLINICAL DATA: Abdominal pain, constipation, dysuria

EXAM:
CT ABDOMEN AND PELVIS WITHOUT CONTRAST
TECHNIQUE: Multidetector CT imaging of the abdomen and pelvis was performed
following the standard protocol without IV contrast.

[Series 2: axial st · axial · 0.83mm/px · z∈[-630,-255]mm · 14 of 86 slices shown, 16 images]
[im 6/86  soft-tissue]
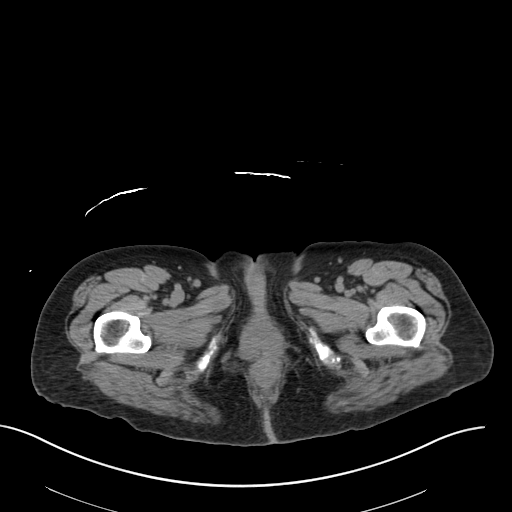
[im 6/86  bone]
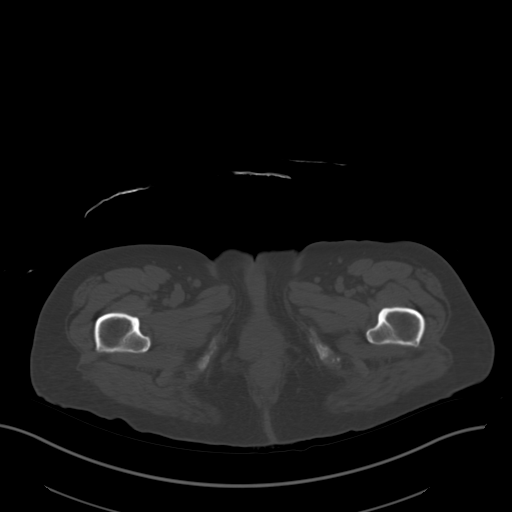
[im 11/86  soft-tissue]
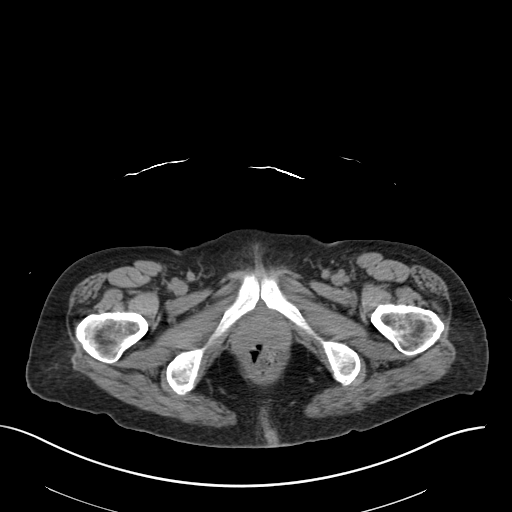
[im 16/86  soft-tissue]
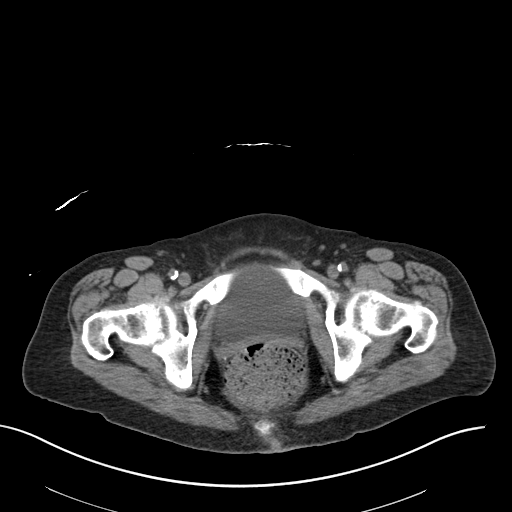
[im 26/86  soft-tissue]
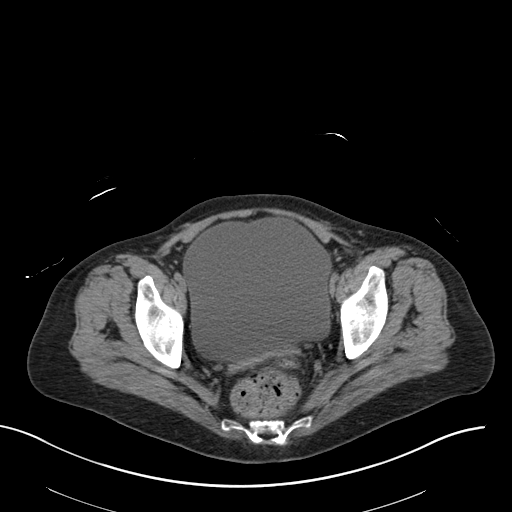
[im 31/86  soft-tissue]
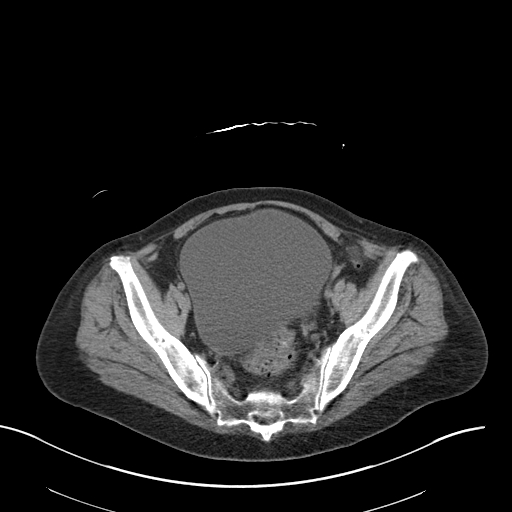
[im 36/86  soft-tissue]
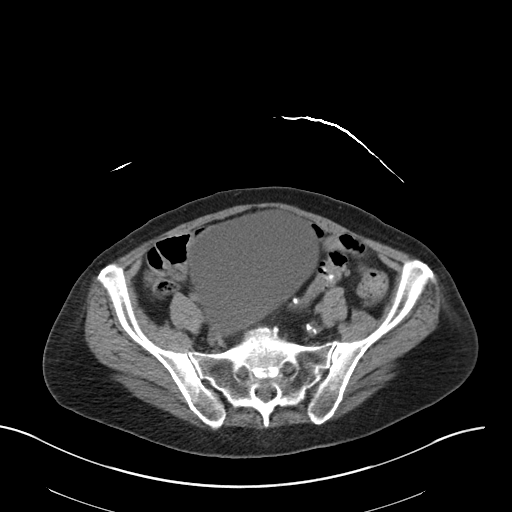
[im 41/86  soft-tissue]
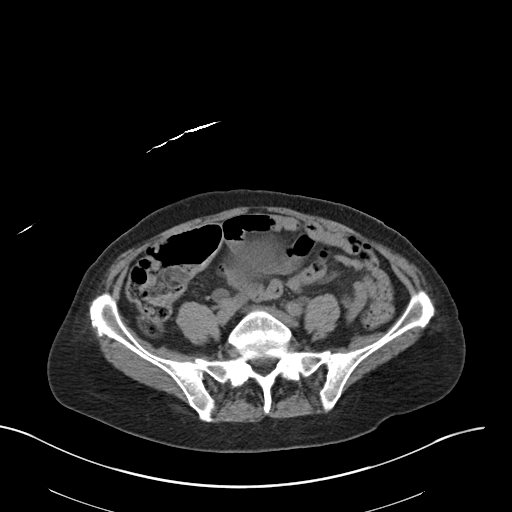
[im 46/86  soft-tissue]
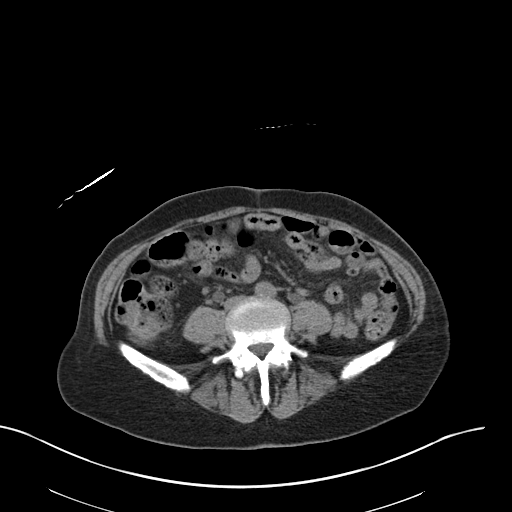
[im 51/86  soft-tissue]
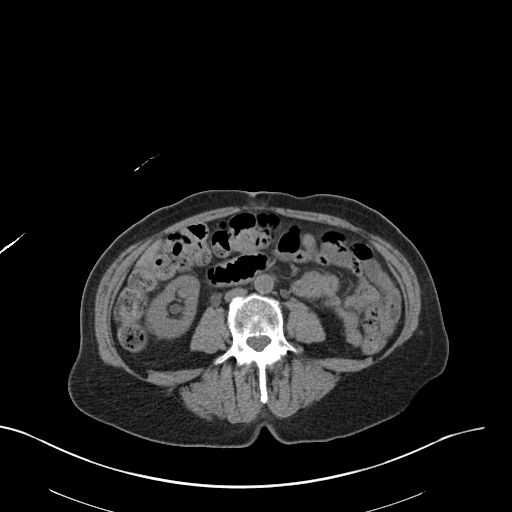
[im 51/86  bone]
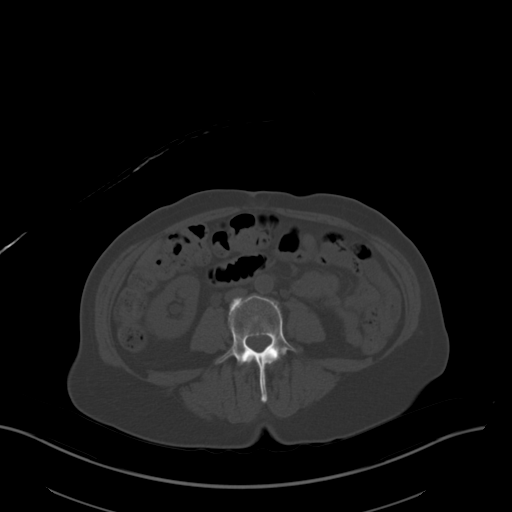
[im 56/86  soft-tissue]
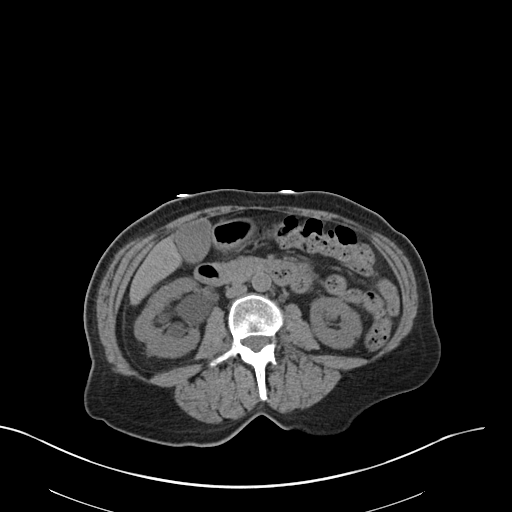
[im 66/86  soft-tissue]
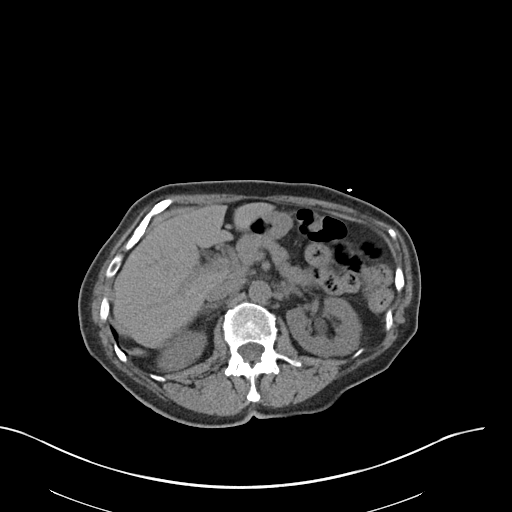
[im 71/86  soft-tissue]
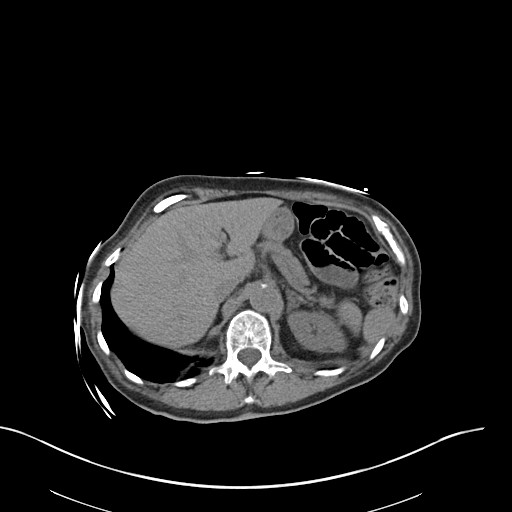
[im 76/86  soft-tissue]
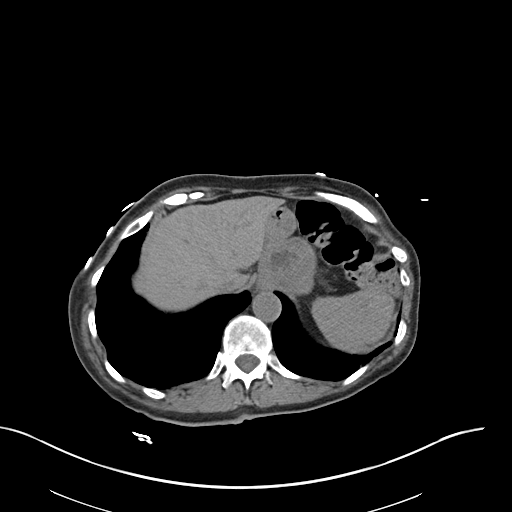
[im 81/86  soft-tissue]
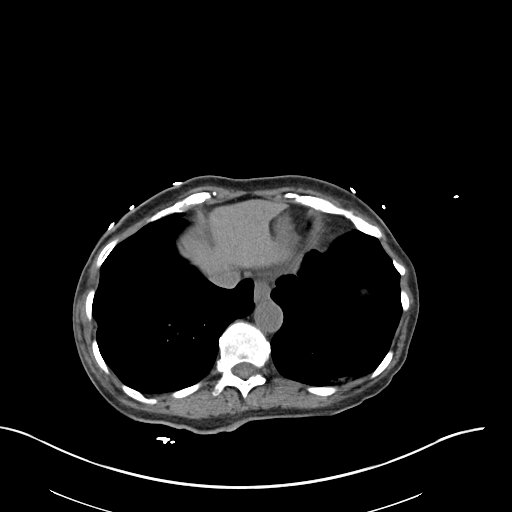

[Series 5: coronal st · coronal · 0.89mm/px · 3 of 126 slices shown]
[im 42/126  soft-tissue]
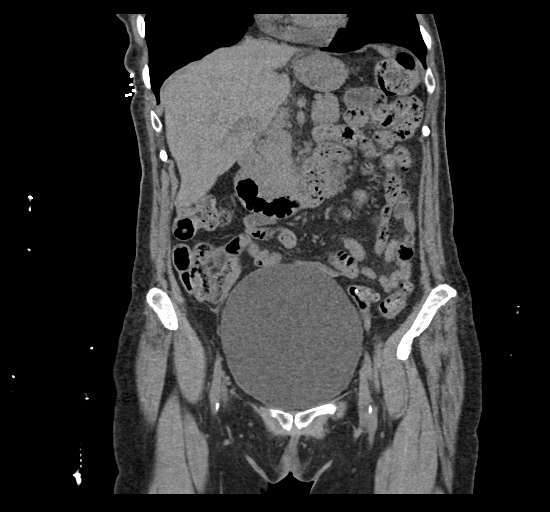
[im 56/126  soft-tissue]
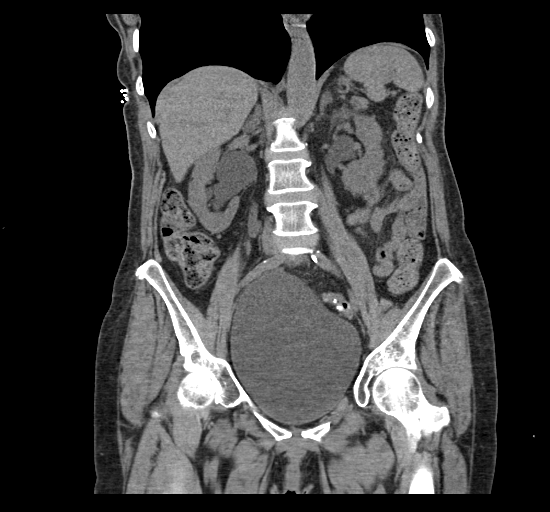
[im 70/126  soft-tissue]
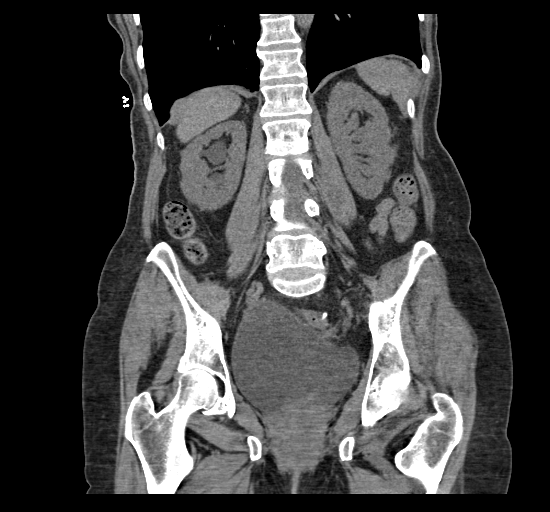

[17 of 46 positions shown; findings below may reference images not displayed]

FINDINGS: Lower chest: New small area of atelectasis or consolidation at the
left lung base.

Hepatobiliary: No focal liver abnormality is seen. No gallstones,
gallbladder wall thickening, or biliary dilatation.

Pancreas: Unremarkable.

Spleen: Unremarkable.

Adrenals/Urinary Tract: Adrenal is unremarkable. There is bilateral
hydroureteronephrosis. No obstructing calculus. Bladder is
distended.

Stomach/Bowel: Small hiatal hernia. Bowel is normal in caliber.
Sigmoid diverticulosis.

Vascular/Lymphatic: Aortic atherosclerosis. No enlarged lymph nodes
identified

Reproductive: Status post hysterectomy. No adnexal masses.

Other: No ascites.  No acute abnormality of the abdominal wall.

Musculoskeletal: No acute osseous abnormality.
IMPRESSION: Distended bladder with bilateral hydroureteronephrosis. No
obstructing calculi identified.

Sigmoid diverticulosis.

New small area of atelectasis or consolidation at the left lung
base.

## 2022-07-11 NOTE — Progress Notes (Unsigned)
Cardiology Office Note:    Date:  07/13/2022   ID:  [name], DOB 02/28/1941, MRN [id number]  PCP:  [name], [place] Providers Cardiologist:  Kirk Ruths, MD Cardiology APP:  Ledora Bottcher, PA  Referring MD: [name], MD   Chief Complaint  Patient presents with   Follow-up    HTN    History of Present Illness:    Debbie Wells is a 81 y.o. female with a hx of hypertension due to endocrine disorder, migraine, neuropathy, hypothyroidism, CKD 3A, HLD, dementia, chronic insomnia, and depression.  She was last seen by cardiology 04/23/2020.  We have been following for difficult to control blood pressure.  She wore a 24-hour blood pressure monitor which indicated wide swings in her blood pressure.  A 24-hour urine for metanephrines was normal.  PCP ruled out Cushing's syndrome.  Lexiscan Myoview 12/17/2012 was nonischemic with an EF of 60%.  She is intolerant to ACE inhibitors due to cough.  She became bradycardic with higher doses of beta-blocker.  GI did not feel she was having mesenteric ischemia as a possible cause for her abdominal pain. She is now on gabapentin for lower extremity tingling and pain.   She was seen in the ER 02/15/22 for trip and fall. She was treated without admission.   She is maintained on 2.5 mg amlodipine, 81 mg aspirin, 50 mg losartan BID, 12.5 mg Toprol.   She presents today for follow up. Her main complaint is frequent urination that started 6 months ago that coincided with starting buspirone BID and stopping HRT/estrogen. She questions losartan, but has been taking this BID for years. We discussed that addition of buspirone along with discontinuation of estrogen may have impacted her bladder function. She has a follow up appt with her primary next week and will discuss these. She is very stable from a cardiac standpoint. Still doing PT for her right hand after her fall in April. She is walking weekly. In addition to  stopping estrogen and waking frequently at night to void in addition to changing anxiety/depression medications, she has had a stressful six months. I think she is handling things well.   Past Medical History:  Diagnosis Date   Adenomatous colon polyp    Adenomatous polyp of colon 12/03/2004   28m   Anxiety    Chronic headaches    Chronic insomnia    Depression    Diverticulosis    External hemorrhoids    Hyperlipidemia    Hypertension    Hyperthyroidism    Hypothyroidism    Umbilical hernia     Past Surgical History:  [place]cedure Laterality Date   [place]  COLONOSCOPY  03/03/2010,12/03/2004   ESOPHAGOGASTRODUODENOSCOPY  12/16/11   Dr. CSilvano Rusk  OOPHORECTOMY     spinal lipoma removed  2006   TOTAL ABDOMINAL HYSTERECTOMY      Current Medications: Current Meds  Medication Sig   ALPRAZolam (XANAX) 0.5 MG tablet Take 1 tablet by mouth 3 (three) times daily as needed.   amLODipine (NORVASC) 2.5 MG tablet Take 2.5 mg by mouth daily.   aspirin 81 MG tablet Take 81 mg by mouth daily.    DULoxetine (CYMBALTA) 60 MG capsule Take 60 mg by mouth every evening.   gabapentin (NEURONTIN) 100 MG capsule Take 100 mg by mouth 2 (two) times daily.   levothyroxine (SYNTHROID) 100 MCG tablet Take 100 mcg by mouth daily.  losartan (COZAAR) 50 MG tablet Take 50 mg by mouth 2 (two) times daily.   metoprolol succinate (TOPROL-XL) 25 MG 24 hr tablet TAKE 1/2 TABLET BY MOUTH EVERY DAY (Patient taking differently: Take 12.5 mg by mouth daily.)   Multiple Vitamin (MULTIVITAMIN) capsule Take 1 capsule by mouth daily.   Omega-3 Fatty Acids (FISH OIL PO) Take 1 tablet by mouth daily.   polyethylene glycol (MIRALAX / GLYCOLAX) packet Take 17 g by mouth daily as needed for mild constipation.   senna-docusate (SENOKOT-S) 8.6-50 MG tablet Take 1 tablet by mouth at bedtime. (Patient taking differently: Take 2 tablets by mouth at bedtime.)   [DISCONTINUED] busPIRone  (BUSPAR) 15 MG tablet Take 1 tablet by mouth 2 (two) times daily.   [DISCONTINUED] cephALEXin (KEFLEX) 250 MG capsule Take 250 mg by mouth daily.      Allergies:   Hydrocodone-acetaminophen, Iodinated contrast media, Sulfa antibiotics, Codeine, Hydrocodone-acetaminophen, Sulfonamide derivatives, Terbinafine, and Benazepril   Social History   Socioeconomic History   Marital status: Married    Spouse name: Not on file   Number of children: 2   Years of education: Not on file   Highest education level: Not on file  Occupational History   Occupation: retired  Tobacco Use   Smoking status: Former    Types: Cigarettes    Quit date: 01/23/1993    Years since quitting: 29.4   Smokeless tobacco: Never  Vaping Use   Vaping Use: Never used  Substance and Sexual Activity   Alcohol use: Yes    Alcohol/week: 2.0 standard drinks of alcohol    Types: 2 Glasses of wine per week   Drug use: No   Sexual activity: Not on file  Other Topics Concern   Not on file  Social History Narrative   Patient is married she is retired and has 2 children   About 2 alcoholic beverages a week no drugs, no current tobacco she is a former smoker      Lives at home with her husband   Right handed   Social Determinants of Health   Financial Resource Strain: Not on file  Food Insecurity: Not on file  Transportation Needs: Not on file  Physical Activity: Not on file  Stress: Not on file  Social Connections: Not on file     Family History: The patient's family history includes Addison's disease in her mother; Esophageal cancer in her father; Heart disease in her mother; Heart failure in her mother; Hypertension in her maternal grandmother and mother; Irritable bowel syndrome in her mother; Kidney disease in her mother; Other in her mother; Stomach cancer in her father; Stroke in her maternal grandmother and mother. There is no history of Colon cancer or Heart attack.  ROS:   Please see the history of  present illness.     All other systems reviewed and are negative.  EKGs/Labs/Other Studies Reviewed:    The following studies were reviewed today:  Echo 2014: Study Conclusions   - Left ventricle: The cavity size was normal. Wall thickness    was normal. Systolic function was normal. The estimated    ejection fraction was in the range of 60% to 65%. Wall    motion was normal; there were no regional wall motion    abnormalities. Doppler parameters are consistent with    abnormal left ventricular relaxation (grade 1 diastolic    dysfunction).  - Atrial septum: No defect or patent foramen ovale was    identified.   EKG:  EKG is not ordered today.    Recent Labs: 08/07/2021: ALT 16 02/15/2022: BUN 14; Creatinine, Ser 1.19; Hemoglobin 12.3; Platelets 258; Potassium 3.9; Sodium 132  Recent Lipid Panel No results found for: "CHOL", "TRIG", "HDL", "CHOLHDL", "VLDL", "LDLCALC", "LDLDIRECT"   Risk Assessment/Calculations:                Physical Exam:    VS:  BP 136/74 (BP Location: Left Arm, Patient Position: Sitting, Cuff Size: Normal)   Pulse 66   Ht '5\' 4"'$  (1.626 m)   Wt 165 lb 3.2 oz (74.9 kg)   SpO2 96%   BMI 28.36 kg/m     Wt Readings from Last 3 Encounters:  07/13/22 165 lb 3.2 oz (74.9 kg)  02/15/22 155 lb (70.3 kg)  08/07/21 159 lb (72.1 kg)     GEN:  Well nourished, well developed in no acute distress HEENT: Normal NECK: No JVD; No carotid bruits LYMPHATICS: No lymphadenopathy CARDIAC: RRR, no murmurs, rubs, gallops RESPIRATORY:  Clear to auscultation without rales, wheezing or rhonchi  ABDOMEN: Soft, non-tender, non-distended MUSCULOSKELETAL:  No edema; No deformity  SKIN: Warm and dry NEUROLOGIC:  Alert and oriented x 3 PSYCHIATRIC:  Normal affect   ASSESSMENT:    1. Hypertension due to endocrine disorder   2. Palpitations   3. Urinary retention   4. Urinary frequency   5. Hyperlipidemia, unspecified hyperlipidemia type    PLAN:    In order of  problems listed above:  Hypertension Hx of labile BP No medication changes I do not think losartan is causing her urinary frequency at night, but am happy to change to another ARB if this hasn't improved when I see her next. Urination not consistent with her prior UTIs. No indication for UA today.   Palpitations Continue BB No complaints today   Hyperlipidemia  Followed by primary care Lipid panel 05/2022 Total chol 199 HDL 66 LDL 108 Trig 125 I do not feel strongly about repeating a lipid panel given her lack of ischemic disease and age.    She has requested to follow with me if Dr. Stanford Breed is unavailable. Please fu in 6 months.     Medication Adjustments/Labs and Tests Ordered: Current medicines are reviewed at length with the patient today.  Concerns regarding medicines are outlined above.  No orders of the defined types were placed in this encounter.  No orders of the defined types were placed in this encounter.   Patient Instructions  Medication Instructions:  Your physician recommends that you continue on your current medications as directed. Please refer to the Current Medication list given to you today.   *If you need a refill on your cardiac medications before your next appointment, please call your pharmacy*   Lab Work: NONE ordered at this time of appointment   If you have labs (blood work) drawn today and your tests are completely normal, you will receive your results only by: [place] (if you have MyChart) OR A paper copy in the mail If you have any lab test that is abnormal or we need to change your treatment, we will call you to review the results.   Testing/Procedures: NONE ordered at this time of appointment     Follow-Up: At [facility], you and your health needs are our priority.  As part of our continuing mission to provide you with exceptional heart care, we have created designated Provider Care Teams.  These Care Teams  include your primary Cardiologist (physician) and  Advanced Practice Providers (APPs -  Physician Assistants and Nurse Practitioners) who all work together to provide you with the care you need, when you need it.  We recommend signing up for the patient portal called "MyChart".  Sign up information is provided on this After Visit Summary.  MyChart is used to connect with patients for Virtual Visits (Telemedicine).  Patients are able to view lab/test results, encounter notes, upcoming appointments, etc.  Non-urgent messages can be sent to your provider as well.   To learn more about what you can do with MyChart, go to [email or web address].    Your next appointment:   6 month(s)  The format for your next appointment:   In Person  Provider:   Fabian Sharp, PA-C        Other Instructions   Important Information About Sugar         Signed, Ledora Bottcher, [place]  07/13/2022 12:47 PM    [place]

## 2022-07-13 ENCOUNTER — Ambulatory Visit: Payer: Medicare PPO | Attending: Physician Assistant | Admitting: Physician Assistant

## 2022-07-13 ENCOUNTER — Encounter: Payer: Self-pay | Admitting: Physician Assistant

## 2022-07-13 VITALS — BP 136/74 | HR 66 | Ht 64.0 in | Wt 165.2 lb

## 2022-07-13 DIAGNOSIS — R002 Palpitations: Secondary | ICD-10-CM

## 2022-07-13 DIAGNOSIS — E785 Hyperlipidemia, unspecified: Secondary | ICD-10-CM

## 2022-07-13 DIAGNOSIS — I152 Hypertension secondary to endocrine disorders: Secondary | ICD-10-CM

## 2022-07-13 DIAGNOSIS — R350 Frequency of micturition: Secondary | ICD-10-CM | POA: Diagnosis not present

## 2022-07-13 DIAGNOSIS — R339 Retention of urine, unspecified: Secondary | ICD-10-CM | POA: Diagnosis not present

## 2022-07-13 NOTE — Patient Instructions (Signed)
Medication Instructions:  Your physician recommends that you continue on your current medications as directed. Please refer to the Current Medication list given to you today.   *If you need a refill on your cardiac medications before your next appointment, please call your pharmacy*   Lab Work: NONE ordered at this time of appointment   If you have labs (blood work) drawn today and your tests are completely normal, you will receive your results only by: [place] (if you have MyChart) OR A paper copy in the mail If you have any lab test that is abnormal or we need to change your treatment, we will call you to review the results.   Testing/Procedures: NONE ordered at this time of appointment     Follow-Up: At [facility], you and your health needs are our priority.  As part of our continuing mission to provide you with exceptional heart care, we have created designated Provider Care Teams.  These Care Teams include your primary Cardiologist (physician) and Advanced Practice Providers (APPs -  Physician Assistants and Nurse Practitioners) who all work together to provide you with the care you need, when you need it.  We recommend signing up for the patient portal called "MyChart".  Sign up information is provided on this After Visit Summary.  MyChart is used to connect with patients for Virtual Visits (Telemedicine).  Patients are able to view lab/test results, encounter notes, upcoming appointments, etc.  Non-urgent messages can be sent to your provider as well.   To learn more about what you can do with MyChart, go to [email or web address].    Your next appointment:   6 month(s)  The format for your next appointment:   In Person  Provider:   Fabian Sharp, PA-C        Other Instructions   Important Information About Sugar

## 2022-07-16 DIAGNOSIS — S62316D Displaced fracture of base of fifth metacarpal bone, right hand, subsequent encounter for fracture with routine healing: Secondary | ICD-10-CM | POA: Diagnosis not present

## 2022-07-23 DIAGNOSIS — E785 Hyperlipidemia, unspecified: Secondary | ICD-10-CM | POA: Diagnosis not present

## 2022-07-23 DIAGNOSIS — E039 Hypothyroidism, unspecified: Secondary | ICD-10-CM | POA: Diagnosis not present

## 2022-07-23 DIAGNOSIS — I700 Atherosclerosis of aorta: Secondary | ICD-10-CM | POA: Diagnosis not present

## 2022-07-23 DIAGNOSIS — N1831 Chronic kidney disease, stage 3a: Secondary | ICD-10-CM | POA: Diagnosis not present

## 2022-07-23 DIAGNOSIS — I129 Hypertensive chronic kidney disease with stage 1 through stage 4 chronic kidney disease, or unspecified chronic kidney disease: Secondary | ICD-10-CM | POA: Diagnosis not present

## 2022-07-23 DIAGNOSIS — I10 Essential (primary) hypertension: Secondary | ICD-10-CM | POA: Diagnosis not present

## 2022-07-23 DIAGNOSIS — E559 Vitamin D deficiency, unspecified: Secondary | ICD-10-CM | POA: Diagnosis not present

## 2022-07-23 DIAGNOSIS — R7301 Impaired fasting glucose: Secondary | ICD-10-CM | POA: Diagnosis not present

## 2022-09-02 DIAGNOSIS — D485 Neoplasm of uncertain behavior of skin: Secondary | ICD-10-CM | POA: Diagnosis not present

## 2022-09-02 DIAGNOSIS — L570 Actinic keratosis: Secondary | ICD-10-CM | POA: Diagnosis not present

## 2022-09-02 DIAGNOSIS — L989 Disorder of the skin and subcutaneous tissue, unspecified: Secondary | ICD-10-CM | POA: Diagnosis not present

## 2022-09-02 DIAGNOSIS — Z85828 Personal history of other malignant neoplasm of skin: Secondary | ICD-10-CM | POA: Diagnosis not present

## 2022-09-02 DIAGNOSIS — L821 Other seborrheic keratosis: Secondary | ICD-10-CM | POA: Diagnosis not present

## 2022-09-07 DIAGNOSIS — Z1231 Encounter for screening mammogram for malignant neoplasm of breast: Secondary | ICD-10-CM | POA: Diagnosis not present

## 2022-11-24 DIAGNOSIS — I10 Essential (primary) hypertension: Secondary | ICD-10-CM | POA: Diagnosis not present

## 2022-11-24 DIAGNOSIS — R002 Palpitations: Secondary | ICD-10-CM | POA: Diagnosis not present

## 2022-11-24 DIAGNOSIS — E039 Hypothyroidism, unspecified: Secondary | ICD-10-CM | POA: Diagnosis not present

## 2022-11-24 DIAGNOSIS — E559 Vitamin D deficiency, unspecified: Secondary | ICD-10-CM | POA: Diagnosis not present

## 2022-11-24 DIAGNOSIS — R7301 Impaired fasting glucose: Secondary | ICD-10-CM | POA: Diagnosis not present

## 2022-11-24 DIAGNOSIS — N390 Urinary tract infection, site not specified: Secondary | ICD-10-CM | POA: Diagnosis not present

## 2022-12-01 DIAGNOSIS — I5189 Other ill-defined heart diseases: Secondary | ICD-10-CM | POA: Diagnosis not present

## 2022-12-01 DIAGNOSIS — N390 Urinary tract infection, site not specified: Secondary | ICD-10-CM | POA: Diagnosis not present

## 2022-12-01 DIAGNOSIS — I700 Atherosclerosis of aorta: Secondary | ICD-10-CM | POA: Diagnosis not present

## 2022-12-01 DIAGNOSIS — Z0000 Encounter for general adult medical examination without abnormal findings: Secondary | ICD-10-CM | POA: Diagnosis not present

## 2022-12-01 DIAGNOSIS — E039 Hypothyroidism, unspecified: Secondary | ICD-10-CM | POA: Diagnosis not present

## 2022-12-01 DIAGNOSIS — Z1339 Encounter for screening examination for other mental health and behavioral disorders: Secondary | ICD-10-CM | POA: Diagnosis not present

## 2022-12-01 DIAGNOSIS — N1831 Chronic kidney disease, stage 3a: Secondary | ICD-10-CM | POA: Diagnosis not present

## 2022-12-01 DIAGNOSIS — Z1331 Encounter for screening for depression: Secondary | ICD-10-CM | POA: Diagnosis not present

## 2022-12-01 DIAGNOSIS — F331 Major depressive disorder, recurrent, moderate: Secondary | ICD-10-CM | POA: Diagnosis not present

## 2022-12-01 DIAGNOSIS — I1310 Hypertensive heart and chronic kidney disease without heart failure, with stage 1 through stage 4 chronic kidney disease, or unspecified chronic kidney disease: Secondary | ICD-10-CM | POA: Diagnosis not present

## 2022-12-01 DIAGNOSIS — E785 Hyperlipidemia, unspecified: Secondary | ICD-10-CM | POA: Diagnosis not present

## 2022-12-21 DIAGNOSIS — N390 Urinary tract infection, site not specified: Secondary | ICD-10-CM | POA: Diagnosis not present

## 2023-01-07 DIAGNOSIS — N390 Urinary tract infection, site not specified: Secondary | ICD-10-CM | POA: Diagnosis not present

## 2023-01-19 DIAGNOSIS — I10 Essential (primary) hypertension: Secondary | ICD-10-CM | POA: Diagnosis not present

## 2023-01-19 DIAGNOSIS — N390 Urinary tract infection, site not specified: Secondary | ICD-10-CM | POA: Diagnosis not present

## 2023-01-19 DIAGNOSIS — E785 Hyperlipidemia, unspecified: Secondary | ICD-10-CM | POA: Diagnosis not present

## 2023-02-03 NOTE — Progress Notes (Signed)
Cardiology Office Note:    Date:  02/17/2023   ID:  Debbie Wells, DOB 02-05-1941, MRN [id number]  PCP:  Adrian Prince, MD   [facility] Providers Cardiologist:  Olga Millers, MD Cardiology APP:  Marcelino Duster, PA     Referring MD: Adrian Prince, MD   Chief Complaint  Patient presents with   Follow-up  HTN  History of Present Illness:    Debbie Wells is a 82 y.o. female with a hx of H Cozad is a 82 y.o. female with a hx of hypertension due to endocrine disorder, migraine, neuropathy, hypothyroidism, CKD 3A, HLD, dementia, chronic insomnia, and depression.  She was last seen by cardiology 04/23/2020.  We have been following for difficult to control blood pressure.  She wore a 24-hour blood pressure monitor which indicated wide swings in her blood pressure.  A 24-hour urine for metanephrines was normal.  PCP ruled out Cushing's syndrome.  Lexiscan Myoview 12/17/2012 was nonischemic with an EF of 60%.  She is intolerant to ACE inhibitors due to cough.  She became bradycardic with higher doses of beta-blocker.  GI did not feel she was having mesenteric ischemia as a possible cause for her abdominal pain. She is now on gabapentin for lower extremity tingling and pain.   She was seen in the ER 02/15/22 for trip and fall. She was treated without admission.   She is maintained on 2.5 mg amlodipine, 81 mg aspirin, 50 mg losartan in the morning, 12.5 mg Toprol.   I saw her in follow-up 07/13/2022 and she was having issues with discontinuation of HRT, starting Wellbutrin, and frequent nocturia.   She presents today for routine follow up. She is having severe problems with UTIs. She is waiting for an appt with urogyn with [facility]. She denies cardiac problems. Still having labile BP. No cardiac complaints.    Past Medical History:  Diagnosis Date   Adenomatous colon polyp    Adenomatous polyp of colon 12/03/2004   70mm   Anxiety    Chronic headaches    Chronic insomnia     Depression    Diverticulosis    External hemorrhoids    Hyperlipidemia    Hypertension    Hyperthyroidism    Hypothyroidism    Umbilical hernia     Past Surgical History:  Procedure Laterality Date   ABDOMINAL HYSTERECTOMY  1975   APPENDECTOMY  1975   COLONOSCOPY  03/03/2010,12/03/2004   ESOPHAGOGASTRODUODENOSCOPY  12/16/11   Dr. Stan Head   OOPHORECTOMY     spinal lipoma removed  2006   TOTAL ABDOMINAL HYSTERECTOMY      Current Medications: Current Meds  Medication Sig   ALPRAZolam (XANAX) 0.5 MG tablet Take 1 tablet by mouth 3 (three) times daily as needed.   amLODipine (NORVASC) 2.5 MG tablet Take 2.5 mg by mouth daily.   aspirin 81 MG tablet Take 81 mg by mouth daily.    busPIRone (BUSPAR) 15 MG tablet Take 15 mg by mouth 2 (two) times daily.   DULoxetine (CYMBALTA) 60 MG capsule Take 60 mg by mouth every evening.   gabapentin (NEURONTIN) 100 MG capsule Take 100 mg by mouth 2 (two) times daily.   levothyroxine (SYNTHROID) 100 MCG tablet Take 100 mcg by mouth daily.   losartan (COZAAR) 50 MG tablet Take 50 mg by mouth 2 (two) times daily.   metoprolol succinate (TOPROL-XL) 25 MG 24 hr tablet TAKE 1/2 TABLET BY MOUTH EVERY DAY (Patient taking differently: Take 12.5 mg  by mouth daily.)   Multiple Vitamin (MULTIVITAMIN) capsule Take 1 capsule by mouth daily.   Omega-3 Fatty Acids (FISH OIL PO) Take 1 tablet by mouth daily.   polyethylene glycol (MIRALAX / GLYCOLAX) packet Take 17 g by mouth daily as needed for mild constipation.   senna-docusate (SENOKOT-S) 8.6-50 MG tablet Take 1 tablet by mouth at bedtime. (Patient taking differently: Take 2 tablets by mouth at bedtime.)   zolpidem (AMBIEN) 5 MG tablet Take 5 mg by mouth at bedtime.     Allergies:   Hydrocodone-acetaminophen, Iodinated contrast media, Sulfa antibiotics, Codeine, Hydrocodone-acetaminophen, Sulfonamide derivatives, Terbinafine, and Benazepril   Social History   Socioeconomic History   Marital status:  Married    Spouse name: Not on file   Number of children: 2   Years of education: Not on file   Highest education level: Not on file  Occupational History   Occupation: retired  Tobacco Use   Smoking status: Former    Types: Cigarettes    Quit date: 01/23/1993    Years since quitting: 30.0   Smokeless tobacco: Never  Vaping Use   Vaping Use: Never used  Substance and Sexual Activity   Alcohol use: Yes    Alcohol/week: 2.0 standard drinks of alcohol    Types: 2 Glasses of wine per week   Drug use: No   Sexual activity: Not on file  Other Topics Concern   Not on file  Social History Narrative   Patient is married she is retired and has 2 children   About 2 alcoholic beverages a week no drugs, no current tobacco she is a former smoker      Lives at home with her husband   Right handed   Social Determinants of Health   Financial Resource Strain: Not on file  Food Insecurity: Not on file  Transportation Needs: Not on file  Physical Activity: Not on file  Stress: Not on file  Social Connections: Not on file     Family History: The patient's family history includes Addison's disease in her mother; Esophageal cancer in her father; Heart disease in her mother; Heart failure in her mother; Hypertension in her maternal grandmother and mother; Irritable bowel syndrome in her mother; Kidney disease in her mother; Other in her mother; Stomach cancer in her father; Stroke in her maternal grandmother and mother. There is no history of Colon cancer or Heart attack.  ROS:   Please see the history of present illness.     All other systems reviewed and are negative.  EKGs/Labs/Other Studies Reviewed:    The following studies were reviewed today:  Echo 2014: Study Conclusions   - Left ventricle: The cavity size was normal. Wall thickness    was normal. Systolic function was normal. The estimated    ejection fraction was in the range of 60% to 65%. Wall    motion was normal; there  were no regional wall motion    abnormalities. Doppler parameters are consistent with    abnormal left ventricular relaxation (grade 1 diastolic    dysfunction).  - Atrial septum: No defect or patent foramen ovale was    identified.   EKG:  EKG is  ordered today.  The ekg ordered today demonstrates sinus rhythm with HR 61, right axis deviaiton  Recent Labs: No results found for requested labs within last 365 days.  Recent Lipid Panel No results found for: "CHOL", "TRIG", "HDL", "CHOLHDL", "VLDL", "LDLCALC", "LDLDIRECT"   Risk Assessment/Calculations:  Physical Exam:    VS:  BP 128/78   Pulse 61   Ht 5\' 4"  (1.626 m)   Wt 156 lb (70.8 kg)   SpO2 98%   BMI 26.78 kg/m     Wt Readings from Last 3 Encounters:  02/17/23 156 lb (70.8 kg)  07/13/22 165 lb 3.2 oz (74.9 kg)  02/15/22 155 lb (70.3 kg)     GEN:  Well nourished, well developed in no acute distress HEENT: Normal NECK: No JVD; No carotid bruits LYMPHATICS: No lymphadenopathy CARDIAC: RRR, no murmurs, rubs, gallops RESPIRATORY:  Clear to auscultation without rales, wheezing or rhonchi  ABDOMEN: Soft, non-tender, non-distended MUSCULOSKELETAL:  No edema; No deformity  SKIN: Warm and dry NEUROLOGIC:  Alert and oriented x 3 PSYCHIATRIC:  Normal affect   ASSESSMENT:    1. Primary hypertension   2. Palpitations   3. Pure hypercholesterolemia   4. Recurrent UTI    PLAN:    In order of problems listed above:  Hypertension Labile BP - May need to switch ARB if her nighttime urination has not changed BP well controlled today, a little higher on her cuff, but after climbing off table, was normotensive on recheck   Palpitations Continue BB   Hyperlipidemia  Followed by primary care Lipid panel 05/2022 Total chol 199 HDL 66 LDL 108 Trig 125 I do not feel strongly about repeating a lipid panel given her lack of ischemic disease and age.    She has requested to follow-up with me if Dr.  Jens Som is unavailable.            Medication Adjustments/Labs and Tests Ordered: Current medicines are reviewed at length with the patient today.  Concerns regarding medicines are outlined above.  Orders Placed This Encounter  Procedures   Ambulatory referral to Urogynecology   EKG 12-Lead   No orders of the defined types were placed in this encounter.   Patient Instructions  Medication Instructions:  No Changes *If you need a refill on your cardiac medications before your next appointment, please call your pharmacy*   Lab Work: No Labs If you have labs (blood work) drawn today and your tests are completely normal, you will receive your results only by: MyChart Message (if you have MyChart) OR A paper copy in the mail If you have any lab test that is abnormal or we need to change your treatment, we will call you to review the results.   Testing/Procedures: No Testing   Follow-Up: At [facility], you and your health needs are our priority.  As part of our continuing mission to provide you with exceptional heart care, we have created designated Provider Care Teams.  These Care Teams include your primary Cardiologist (physician) and Advanced Practice Providers (APPs -  Physician Assistants and Nurse Practitioners) who all work together to provide you with the care you need, when you need it.  We recommend signing up for the patient portal called "MyChart".  Sign up information is provided on this After Visit Summary.  MyChart is used to connect with patients for Virtual Visits (Telemedicine).  Patients are able to view lab/test results, encounter notes, upcoming appointments, etc.  Non-urgent messages can be sent to your provider as well.   To learn more about what you can do with MyChart, go to [email or web address].    Your next appointment:   4-5 month(s)  Provider:   Micah Flesher, PA-C    or , Olga Millers, MD   Signed, Roe Rutherford  Kathe Wirick, PA   02/17/2023 12:28 PM    [facility]

## 2023-02-09 DIAGNOSIS — N1831 Chronic kidney disease, stage 3a: Secondary | ICD-10-CM | POA: Diagnosis not present

## 2023-02-09 DIAGNOSIS — N390 Urinary tract infection, site not specified: Secondary | ICD-10-CM | POA: Diagnosis not present

## 2023-02-09 DIAGNOSIS — R3989 Other symptoms and signs involving the genitourinary system: Secondary | ICD-10-CM | POA: Diagnosis not present

## 2023-02-09 DIAGNOSIS — I129 Hypertensive chronic kidney disease with stage 1 through stage 4 chronic kidney disease, or unspecified chronic kidney disease: Secondary | ICD-10-CM | POA: Diagnosis not present

## 2023-02-11 DIAGNOSIS — I10 Essential (primary) hypertension: Secondary | ICD-10-CM | POA: Diagnosis not present

## 2023-02-11 DIAGNOSIS — E785 Hyperlipidemia, unspecified: Secondary | ICD-10-CM | POA: Diagnosis not present

## 2023-02-11 DIAGNOSIS — N390 Urinary tract infection, site not specified: Secondary | ICD-10-CM | POA: Diagnosis not present

## 2023-02-11 DIAGNOSIS — E039 Hypothyroidism, unspecified: Secondary | ICD-10-CM | POA: Diagnosis not present

## 2023-02-17 ENCOUNTER — Encounter: Payer: Self-pay | Admitting: Physician Assistant

## 2023-02-17 ENCOUNTER — Ambulatory Visit: Payer: Medicare PPO | Attending: Physician Assistant | Admitting: Physician Assistant

## 2023-02-17 VITALS — BP 128/78 | HR 61 | Ht 64.0 in | Wt 156.0 lb

## 2023-02-17 DIAGNOSIS — I10 Essential (primary) hypertension: Secondary | ICD-10-CM

## 2023-02-17 DIAGNOSIS — N390 Urinary tract infection, site not specified: Secondary | ICD-10-CM

## 2023-02-17 DIAGNOSIS — R002 Palpitations: Secondary | ICD-10-CM | POA: Diagnosis not present

## 2023-02-17 DIAGNOSIS — E7800 Pure hypercholesterolemia, unspecified: Secondary | ICD-10-CM | POA: Diagnosis not present

## 2023-02-17 NOTE — Patient Instructions (Signed)
Medication Instructions:  No Changes *If you need a refill on your cardiac medications before your next appointment, please call your pharmacy*   Lab Work: No Labs If you have labs (blood work) drawn today and your tests are completely normal, you will receive your results only by: MyChart Message (if you have MyChart) OR A paper copy in the mail If you have any lab test that is abnormal or we need to change your treatment, we will call you to review the results.   Testing/Procedures: No Testing   Follow-Up: At [facility], you and your health needs are our priority.  As part of our continuing mission to provide you with exceptional heart care, we have created designated Provider Care Teams.  These Care Teams include your primary Cardiologist (physician) and Advanced Practice Providers (APPs -  Physician Assistants and Nurse Practitioners) who all work together to provide you with the care you need, when you need it.  We recommend signing up for the patient portal called "MyChart".  Sign up information is provided on this After Visit Summary.  MyChart is used to connect with patients for Virtual Visits (Telemedicine).  Patients are able to view lab/test results, encounter notes, upcoming appointments, etc.  Non-urgent messages can be sent to your provider as well.   To learn more about what you can do with MyChart, go to [email or web address].    Your next appointment:   4-5 month(s)  Provider:   Micah Flesher, PA-C    or , Olga Millers, MD

## 2023-02-22 DIAGNOSIS — L720 Epidermal cyst: Secondary | ICD-10-CM | POA: Diagnosis not present

## 2023-02-22 DIAGNOSIS — L821 Other seborrheic keratosis: Secondary | ICD-10-CM | POA: Diagnosis not present

## 2023-02-22 DIAGNOSIS — D485 Neoplasm of uncertain behavior of skin: Secondary | ICD-10-CM | POA: Diagnosis not present

## 2023-02-22 DIAGNOSIS — I788 Other diseases of capillaries: Secondary | ICD-10-CM | POA: Diagnosis not present

## 2023-02-22 DIAGNOSIS — D225 Melanocytic nevi of trunk: Secondary | ICD-10-CM | POA: Diagnosis not present

## 2023-02-22 DIAGNOSIS — L570 Actinic keratosis: Secondary | ICD-10-CM | POA: Diagnosis not present

## 2023-02-22 DIAGNOSIS — D2261 Melanocytic nevi of right upper limb, including shoulder: Secondary | ICD-10-CM | POA: Diagnosis not present

## 2023-02-22 DIAGNOSIS — L820 Inflamed seborrheic keratosis: Secondary | ICD-10-CM | POA: Diagnosis not present

## 2023-02-22 DIAGNOSIS — L400 Psoriasis vulgaris: Secondary | ICD-10-CM | POA: Diagnosis not present

## 2023-02-28 DIAGNOSIS — R8271 Bacteriuria: Secondary | ICD-10-CM | POA: Diagnosis not present

## 2023-02-28 DIAGNOSIS — N3020 Other chronic cystitis without hematuria: Secondary | ICD-10-CM | POA: Diagnosis not present

## 2023-02-28 DIAGNOSIS — R300 Dysuria: Secondary | ICD-10-CM | POA: Diagnosis not present

## 2023-03-08 DIAGNOSIS — E785 Hyperlipidemia, unspecified: Secondary | ICD-10-CM | POA: Diagnosis not present

## 2023-03-08 DIAGNOSIS — R7301 Impaired fasting glucose: Secondary | ICD-10-CM | POA: Diagnosis not present

## 2023-03-08 DIAGNOSIS — I700 Atherosclerosis of aorta: Secondary | ICD-10-CM | POA: Diagnosis not present

## 2023-03-08 DIAGNOSIS — N1831 Chronic kidney disease, stage 3a: Secondary | ICD-10-CM | POA: Diagnosis not present

## 2023-03-08 DIAGNOSIS — E039 Hypothyroidism, unspecified: Secondary | ICD-10-CM | POA: Diagnosis not present

## 2023-03-08 DIAGNOSIS — G609 Hereditary and idiopathic neuropathy, unspecified: Secondary | ICD-10-CM | POA: Diagnosis not present

## 2023-03-08 DIAGNOSIS — F331 Major depressive disorder, recurrent, moderate: Secondary | ICD-10-CM | POA: Diagnosis not present

## 2023-03-08 DIAGNOSIS — G4700 Insomnia, unspecified: Secondary | ICD-10-CM | POA: Diagnosis not present

## 2023-03-08 DIAGNOSIS — I129 Hypertensive chronic kidney disease with stage 1 through stage 4 chronic kidney disease, or unspecified chronic kidney disease: Secondary | ICD-10-CM | POA: Diagnosis not present

## 2023-03-14 ENCOUNTER — Ambulatory Visit: Payer: Medicare PPO | Admitting: Internal Medicine

## 2023-03-17 ENCOUNTER — Other Ambulatory Visit: Payer: Self-pay

## 2023-03-17 ENCOUNTER — Ambulatory Visit: Payer: Medicare PPO | Admitting: Internal Medicine

## 2023-03-17 ENCOUNTER — Encounter: Payer: Self-pay | Admitting: Internal Medicine

## 2023-03-17 VITALS — BP 146/80 | HR 71 | Temp 98.3°F | Resp 16 | Wt 154.0 lb

## 2023-03-17 DIAGNOSIS — Z87440 Personal history of urinary (tract) infections: Secondary | ICD-10-CM

## 2023-03-17 DIAGNOSIS — R300 Dysuria: Secondary | ICD-10-CM | POA: Diagnosis not present

## 2023-03-17 MED ORDER — METHENAMINE HIPPURATE 1 G PO TABS: 1.0000 g | ORAL_TABLET | Freq: Two times a day (BID) | ORAL | 1 refills | Status: AC

## 2023-03-17 NOTE — Progress Notes (Signed)
[facility]  Reason for Consult: Recurrent urinary tract infection  Referring Provider: [facility], Bartholomew Crews, NP   HPI:    Debbie Wells is a 82 y.o. female with PMHx as below who presents to the clinic for recurrent urinary tract infection.   Patient is a very pleasant 82 year old woman accompanied today by her husband.  She has been referred by her urologist for discussion of her recurrent urinary tract infections.  She previously followed with urology for her urologic complaints but then did well enough where she was able to follow more consistently with her primary care physician.  She has required several courses of antibiotics due to recurrent cystitis symptoms in the past.  She reports that often times she will have pain with urination as the most prominent symptom.  She also describes some frequency and urgency.  Per the referral note sent from her urology office, urine cultures have been positive with different bacteria.  She had a urine culture on 02/11/2023 that grew a pansensitive Pseudomonas.  She reportedly has also grown ESBL Klebsiella in the past.  The notes indicate that her last urine culture grew Pseudomonas and was treated with ciprofloxacin, however, apparently she felt worse while on that medication per the urology notes.  There is no report of systemic symptoms such as fever or chills.  She has previously been on low-dose cephalexin for prophylaxis, however, is not currently taking this medication.  When she saw urology on 4/29, she had a urinalysis completed which was nitrite negative, leukocyte esterase negative, negative for blood, and cultures were no growth.  She was prescribed fosfomycin to take if she develops symptoms of infection.  She took this fosfomycin x 2 since that visit on 5/8 and 5/15.  She was also prescribed Pyridium and estrogen cream to help with her chronic dysuria symptoms.  She is scheduled to see Dr. Marlou Porch  at their office next month on 04/14/2023.  She reports the chronic dysuria symptoms had been a lot to deal with.  She continues to report significant pain and discomfort.  She is not sleeping as well and it is causing her some emotional distress.  The lack of sleep has resulted in her having some tremulousness and shakiness that her husband has noted.  The urine cultures available via our EMR from April 2021 through November 2021 were all no growth except for 1 culture in July that grew Pseudomonas.    Patient's Medications  New Prescriptions   METHENAMINE (HIPREX) 1 G TABLET    Take 1 tablet (1 g total) by mouth 2 (two) times daily with a meal.  Previous Medications   ALPRAZOLAM (XANAX) 0.5 MG TABLET    Take 1 tablet by mouth 3 (three) times daily as needed.   AMLODIPINE (NORVASC) 2.5 MG TABLET    Take 2.5 mg by mouth daily.   ASPIRIN 81 MG TABLET    Take 81 mg by mouth daily.    BACITRACIN OPHTHALMIC OINTMENT    Place 1 application into both eyes daily as needed for wound care. Dry eyes   BUSPIRONE (BUSPAR) 15 MG TABLET    Take 15 mg by mouth 2 (two) times daily.   DULOXETINE (CYMBALTA) 60 MG CAPSULE    Take 60 mg by mouth every evening.   GABAPENTIN (NEURONTIN) 100 MG CAPSULE    Take 100 mg by mouth 2 (two) times daily.   LEVOTHYROXINE (SYNTHROID) 100 MCG TABLET    Take  100 mcg by mouth daily.   LOSARTAN (COZAAR) 50 MG TABLET    Take 50 mg by mouth 2 (two) times daily.   METOPROLOL SUCCINATE (TOPROL-XL) 25 MG 24 HR TABLET    TAKE 1/2 TABLET BY MOUTH EVERY DAY   MULTIPLE VITAMIN (MULTIVITAMIN) CAPSULE    Take 1 capsule by mouth daily.   OMEGA-3 FATTY ACIDS (FISH OIL PO)    Take 1 tablet by mouth daily.   PHENAZOPYRIDINE (PYRIDIUM) 200 MG TABLET    Take 200 mg by mouth 3 (three) times daily.   POLYETHYLENE GLYCOL (MIRALAX / GLYCOLAX) PACKET    Take 17 g by mouth daily as needed for mild constipation.   SENNA-DOCUSATE (SENOKOT-S) 8.6-50 MG TABLET    Take 1 tablet by mouth at bedtime.   ZOLPIDEM  (AMBIEN) 5 MG TABLET    Take 5 mg by mouth at bedtime.  Modified Medications   No medications on file  Discontinued Medications   FOSFOMYCIN (MONUROL) 3 G PACK    SMARTSIG:1 Packet(s) By Mouth Once a Week      Past Medical History:  Diagnosis Date   Adenomatous colon polyp    Adenomatous polyp of colon 12/03/2004   6mm   Anxiety    Chronic headaches    Chronic insomnia    Depression    Diverticulosis    External hemorrhoids    Hyperlipidemia    Hypertension    Hyperthyroidism    Hypothyroidism    Umbilical hernia     Social History   Tobacco Use   Smoking status: Former    Types: Cigarettes    Quit date: 01/23/1993    Years since quitting: 30.1   Smokeless tobacco: Never  Vaping Use   Vaping Use: Never used  Substance Use Topics   Alcohol use: Yes    Alcohol/week: 2.0 standard drinks of alcohol    Types: 2 Glasses of wine per week   Drug use: No    Family History  Problem Relation Age of Onset   Esophageal cancer Father    Stomach cancer Father        mets from esophagus   Heart disease Mother    Irritable bowel syndrome Mother    Kidney disease Mother    Other Mother        cushings disease   Addison's disease Mother    Heart failure Mother    Stroke Mother    Hypertension Mother    Stroke Maternal Grandmother    Hypertension Maternal Grandmother    Colon cancer Neg Hx    Heart attack Neg Hx     Allergies  Allergen Reactions   Hydrocodone-Acetaminophen Rash    Vicodin=REACTION: rash   Iodinated Contrast Media Hives and Rash    Per patient she has developed hives and itching in upper trunk of body after last 2 CT scans once she got homes that she has just treated at home with benadryl. She has not told anyone until now. States she was able to bear it  Other reaction(s): Rash Per patient she has developed hives and itching in upper trunk of body after last 2 CT scans once she got homes that she has just treated at home with benadryl. She has not told  anyone until now. States she was able to bear it    Sulfa Antibiotics Rash    REACTION: rash Other reaction(s): Unknown   Codeine     Other reaction(s): Unknown   Hydrocodone-Acetaminophen  Vicodin=REACTION: rash   Sulfonamide Derivatives     REACTION: rash   Terbinafine     Other reaction(s): Unknown   Benazepril Cough    Other reaction(s): Cough    Review of Systems  All other systems reviewed and are negative.  Except as above in HPI.   OBJECTIVE:    Vitals:   03/17/23 1458  BP: (!) 146/80  Pulse: 71  Resp: 16  Temp: 98.3 F (36.8 C)  TempSrc: Temporal  SpO2: 95%  Weight: 154 lb (69.9 kg)     Body mass index is 26.43 kg/m.  Physical Exam Constitutional:      Appearance: Normal appearance.     Comments: She is very pleasant and accompanied by her husband.  She at times became tearful when discussing her symptoms.  HENT:     Head: Normocephalic and atraumatic.  Pulmonary:     Effort: Pulmonary effort is normal. No respiratory distress.  Skin:    General: Skin is warm and dry.  Neurological:     General: No focal deficit present.     Mental Status: She is alert and oriented to person, place, and time.  Psychiatric:        Mood and Affect: Mood normal.        Behavior: Behavior normal.      Labs and Microbiology:     Latest Ref Rng & Units 02/15/2022    2:52 PM 08/07/2021    2:30 PM 09/26/2020    5:45 PM  CBC  WBC 4.0 - 10.5 K/uL 6.6  5.2  6.9   Hemoglobin 12.0 - 15.0 g/dL 47.8  29.5  62.1   Hematocrit 36.0 - 46.0 % 37.2  36.2  37.5   Platelets 150 - 400 K/uL 258  221  298       Latest Ref Rng & Units 02/15/2022    2:52 PM 08/07/2021    2:30 PM 09/26/2020    5:45 PM  CMP  Glucose 70 - 99 mg/dL 308  91  657   BUN 8 - 23 mg/dL 14  16  11    Creatinine 0.44 - 1.00 mg/dL 8.46  9.62  9.52   Sodium 135 - 145 mmol/L 132  134  130   Potassium 3.5 - 5.1 mmol/L 3.9  4.4  4.2   Chloride 98 - 111 mmol/L 98  99  94   CO2 22 - 32 mmol/L 25  27  24     Calcium 8.9 - 10.3 mg/dL 84.1  9.1  9.6   Total Protein 6.5 - 8.1 g/dL  6.9  7.5   Total Bilirubin 0.3 - 1.2 mg/dL  0.4  0.6   Alkaline Phos 38 - 126 U/L  55  63   AST 15 - 41 U/L  21  28   ALT 0 - 44 U/L  16  25       ASSESSMENT & PLAN:    History of recurrent UTIs  Patient here to discuss recurrent urinary tract infections and chronic dysuria.  We discussed asymptomatic bacteruria and urinary tract colonization that does not typically warrant antibiotic management.  Chronic urinary symptoms can be hard to sort through and determine true infection versus other symptoms as I think is the case in this situation.  Referred to her recent urinalysis and cultures which were unremarkable and not indicative of infection despite her chronic dysuria symptoms at the time as an example.  I would worry that frequent courses of antibiotics will eventually  lead to further drug-resistant bacteria as evidenced by the referral notes indicating she has grown ESBL Klebsiella in the past.  At this time, will focus attention on her dysuria symptoms and agree with what urology has already implemented in terms of estrogen cream and Pyridium.  Will also recommend cranberry supplements (she reports the juice causes increased burning) and also prescribe a trial of Hiprex 1gm BID.  Advised to keep follow-up with urology as scheduled in June and we will see her back in approximately 6 weeks to see how she has responded to this antibiotic sparing strategy.   Vedia Coffer for Infectious [facility] 03/17/2023, 5:07 PM  I have personally spent 45 minutes involved in face-to-face and non-face-to-face activities for this patient on the day of the visit. Professional time spent includes the following activities: Preparing to see the patient (review of tests), Obtaining and/or reviewing separately obtained history (admission/discharge record), Performing a medically appropriate  examination and/or evaluation , Ordering medications/tests/procedures, referring and communicating with other health care professionals, Documenting clinical information in the EMR, Independently interpreting results (not separately reported), Communicating results to the patient/family/caregiver, Counseling and educating the patient/family/caregiver and Care coordination (not separately reported).

## 2023-03-17 NOTE — Patient Instructions (Signed)
Thank you for coming to see me today. It was a pleasure seeing you.  To Do: Continue estrogen cream and pyridium Add cranberry pills and Hiprex (prescription sent) Stop the fosfomycin for now Follow up in 6 weeks with Dr Drue Second or Daiva Eves  If you have any questions or concerns, please do not hesitate to call the office at [phone].  Take Care,   Gwynn Burly

## 2023-03-17 NOTE — Assessment & Plan Note (Signed)
  Patient here to discuss recurrent urinary tract infections and chronic dysuria.  We discussed asymptomatic bacteruria and urinary tract colonization that does not typically warrant antibiotic management.  Chronic urinary symptoms can be hard to sort through and determine true infection versus other symptoms as I think is the case in this situation.  Referred to her recent urinalysis and cultures which were unremarkable and not indicative of infection despite her chronic dysuria symptoms at the time as an example.  I would worry that frequent courses of antibiotics will eventually lead to further drug-resistant bacteria as evidenced by the referral notes indicating she has grown ESBL Klebsiella in the past.  At this time, will focus attention on her dysuria symptoms and agree with what urology has already implemented in terms of estrogen cream and Pyridium.  Will also recommend cranberry supplements (she reports the juice causes increased burning) and also prescribe a trial of Hiprex 1gm BID.  Advised to keep follow-up with urology as scheduled in June and we will see her back in approximately 6 weeks to see how she has responded to this antibiotic sparing strategy.

## 2023-03-22 ENCOUNTER — Ambulatory Visit: Payer: Medicare PPO | Admitting: Adult Health

## 2023-03-25 ENCOUNTER — Encounter: Payer: Self-pay | Admitting: Internal Medicine

## 2023-03-25 ENCOUNTER — Telehealth: Payer: Self-pay | Admitting: Adult Health

## 2023-03-25 NOTE — Telephone Encounter (Signed)
Patient notified of recommendation to stay on Ambien until her appt.

## 2023-03-25 NOTE — Telephone Encounter (Signed)
Pt Lvm @ 10:04a.  She said Almira Coaster told her to call back today.  She said the Belsomra did NOT work and she's taken it for 2 nights.  She said last night she took Ambien last night and slept from 11:30p to 5a.  She said she would like to talk to Almira Coaster about a different medicine.   No upcoming appts scheduled.

## 2023-03-25 NOTE — Telephone Encounter (Signed)
Call we call her pharmacy and see if she could try a low dose of Amitriptyline - any interactions - [facility].

## 2023-03-25 NOTE — Telephone Encounter (Signed)
Routed back to you

## 2023-03-25 NOTE — Telephone Encounter (Signed)
Pt is scheduled for 5/28

## 2023-03-25 NOTE — Telephone Encounter (Signed)
Pharmacist said not high risk, but could increase the duloxetine, no other issues.

## 2023-03-25 NOTE — Telephone Encounter (Signed)
Please see message from patient. She is scheduled for 5/28.

## 2023-03-25 NOTE — Telephone Encounter (Signed)
Please call patient to schedule an appt, has not been seen since 6/23.

## 2023-03-29 ENCOUNTER — Encounter: Payer: Self-pay | Admitting: Adult Health

## 2023-03-29 ENCOUNTER — Ambulatory Visit (INDEPENDENT_AMBULATORY_CARE_PROVIDER_SITE_OTHER): Payer: Medicare PPO | Admitting: Adult Health

## 2023-03-29 DIAGNOSIS — F331 Major depressive disorder, recurrent, moderate: Secondary | ICD-10-CM

## 2023-03-29 DIAGNOSIS — F411 Generalized anxiety disorder: Secondary | ICD-10-CM

## 2023-03-29 DIAGNOSIS — G4700 Insomnia, unspecified: Secondary | ICD-10-CM

## 2023-03-29 MED ORDER — HYDROXYZINE PAMOATE 25 MG PO CAPS: ORAL_CAPSULE | ORAL | 2 refills | Status: DC

## 2023-03-29 NOTE — Progress Notes (Signed)
SHAR EMDE [id number] 12-13-1940 82 y.o.  Subjective:   Patient ID:  Debbie Wells is a 82 y.o. (DOB May 30, 1941) female.  Chief Complaint: No chief complaint on file.   HPI Debbie Wells presents to the office today for follow-up of GAD, MDD, and insomnia.  Describes mood today as "not the best". Pleasant. Reports tearfulness. Mood symptoms - reports some depression, anxiety, and irritability - "more situational". Reports some worry, rumination, and over thinking. Mood is lower. Reports ongoing unresolved UTI's as a possible precipitant to mood decline. She has been referred to a Uro-gynecologist and is awaiting an appointment. Reports a long history of recurrent UTI's - contributing to a low mood.She also notes discontinuing estrogen after 50+ years which may also be effecting mood and sleep. She has tried various sleep aids over the past few weeks with minimal success. She is willing to consider other options. Decreased interest and motivation. Taking medications as prescribed. Energy levels lower. Active, does not have a regular exercise routine.  Enjoys some usual interests and activities. Married. Lives with husband at friends home. Has 2 children - son in [place], and daughter in [place]. Spending time with family. Appetite adequate. Weight loss - 150 pounds.  Reports sleeping difficulties over the past 4 to 5 months - varies night to night - averages 4 hours with 10mg  of Ambien and then wakes up early morning and takes Xanax and Benadryl and then gets 3 more hours of sleep. Denies daytime napping.  Focus and concentration stable. Completing tasks. Managing aspects of household. Retired. Denies SI or HI.  Denies AH or VH. Denies self harm. Denies substance use.  Previous medication trials: Unknown   Mini-Mental    Flowsheet Row Office Visit from 12/22/2020 in [facility]  Total Score (max 30 points ) 29      PHQ2-9    Flowsheet Row Office  Visit from 03/17/2023 in [facility]  PHQ-2 Total Score 0      Flowsheet Row ED from 02/15/2022 in [facility] Department at [facility] ED from 08/07/2021 in [facility] Department at [facility] ED from 09/26/2020 in [facility] Emergency Department at [facility]  C-SSRS RISK CATEGORY No Risk No Risk No Risk        Review of Systems:  Review of Systems  Musculoskeletal:  Negative for gait problem.  Neurological:  Negative for tremors.  Psychiatric/Behavioral:         Please refer to HPI    Medications: I have reviewed the patient's current medications.  Current Outpatient Medications  Medication Sig Dispense Refill   hydrOXYzine (VISTARIL) 25 MG capsule Take one to two capsules at bedtime. 60 capsule 2   ALPRAZolam (XANAX) 0.5 MG tablet Take 1 tablet by mouth 3 (three) times daily as needed.     amLODipine (NORVASC) 2.5 MG tablet Take 2.5 mg by mouth daily.     aspirin 81 MG tablet Take 81 mg by mouth daily.      bacitracin ophthalmic ointment Place 1 application into both eyes daily as needed for wound care. Dry eyes (Patient not taking: Reported on 02/17/2023)  0   busPIRone (BUSPAR) 15 MG tablet Take 15 mg by mouth 2 (two) times daily.     DULoxetine (CYMBALTA) 60 MG capsule Take 60 mg by mouth every evening.     gabapentin (NEURONTIN) 100 MG capsule Take 100 mg by mouth 2 (two) times daily.  levothyroxine (SYNTHROID) 100 MCG tablet Take 100 mcg by mouth daily.     losartan (COZAAR) 50 MG tablet Take 50 mg by mouth 2 (two) times daily.     methenamine (HIPREX) 1 g tablet Take 1 tablet (1 g total) by mouth 2 (two) times daily with a meal. 60 tablet 1   metoprolol succinate (TOPROL-XL) 25 MG 24 hr tablet TAKE 1/2 TABLET BY MOUTH EVERY DAY (Patient taking differently: Take 12.5 mg by mouth daily.) 45 tablet 3   Multiple Vitamin (MULTIVITAMIN) capsule Take 1 capsule by mouth daily.     Omega-3 Fatty  Acids (FISH OIL PO) Take 1 tablet by mouth daily.     phenazopyridine (PYRIDIUM) 200 MG tablet Take 200 mg by mouth 3 (three) times daily.     polyethylene glycol (MIRALAX / GLYCOLAX) packet Take 17 g by mouth daily as needed for mild constipation.     senna-docusate (SENOKOT-S) 8.6-50 MG tablet Take 1 tablet by mouth at bedtime. (Patient taking differently: Take 2 tablets by mouth at bedtime.) 30 tablet 0   zolpidem (AMBIEN) 5 MG tablet Take 5 mg by mouth at bedtime.     No current facility-administered medications for this visit.    Medication Side Effects: None  Allergies:  Allergies  Allergen Reactions   Hydrocodone-Acetaminophen Rash    Vicodin=REACTION: rash   Iodinated Contrast Media Hives and Rash    Per patient she has developed hives and itching in upper trunk of body after last 2 CT scans once she got homes that she has just treated at home with benadryl. She has not told anyone until now. States she was able to bear it  Other reaction(s): Rash Per patient she has developed hives and itching in upper trunk of body after last 2 CT scans once she got homes that she has just treated at home with benadryl. She has not told anyone until now. States she was able to bear it    Sulfa Antibiotics Rash    REACTION: rash Other reaction(s): Unknown   Codeine     Other reaction(s): Unknown   Hydrocodone-Acetaminophen     Vicodin=REACTION: rash   Sulfonamide Derivatives     REACTION: rash   Terbinafine     Other reaction(s): Unknown   Benazepril Cough    Other reaction(s): Cough    Past Medical History:  Diagnosis Date   Adenomatous colon polyp    Adenomatous polyp of colon 12/03/2004   6mm   Anxiety    Chronic headaches    Chronic insomnia    Depression    Diverticulosis    External hemorrhoids    Hyperlipidemia    Hypertension    Hyperthyroidism    Hypothyroidism    Umbilical hernia     Past Medical History, Surgical history, Social history, and Family history were  reviewed and updated as appropriate.   Please see review of systems for further details on the patient's review from today.   Objective:   Physical Exam:  There were no vitals taken for this visit.  Physical Exam Constitutional:      General: She is not in acute distress. Musculoskeletal:        General: No deformity.  Neurological:     Mental Status: She is alert and oriented to person, place, and time.     Coordination: Coordination normal.  Psychiatric:        Attention and Perception: Attention and perception normal. She does not perceive auditory or visual hallucinations.  Mood and Affect: Mood normal. Mood is not anxious or depressed. Affect is not labile, blunt, angry or inappropriate.        Speech: Speech normal.        Behavior: Behavior normal.        Thought Content: Thought content normal. Thought content is not paranoid or delusional. Thought content does not include homicidal or suicidal ideation. Thought content does not include homicidal or suicidal plan.        Cognition and Memory: Cognition and memory normal.        Judgment: Judgment normal.     Comments: Insight intact     Lab Review:     Component Value Date/Time   NA 132 (L) 02/15/2022 1452   NA 142 12/28/2018 1515   K 3.9 02/15/2022 1452   CL 98 02/15/2022 1452   CO2 25 02/15/2022 1452   GLUCOSE 137 (H) 02/15/2022 1452   BUN 14 02/15/2022 1452   BUN 10 12/28/2018 1515   CREATININE 1.19 (H) 02/15/2022 1452   CALCIUM 10.0 02/15/2022 1452   PROT 6.9 08/07/2021 1430   PROT 7.0 12/28/2018 1515   ALBUMIN 4.0 08/07/2021 1430   ALBUMIN 4.8 (H) 12/28/2018 1515   AST 21 08/07/2021 1430   ALT 16 08/07/2021 1430   ALKPHOS 55 08/07/2021 1430   BILITOT 0.4 08/07/2021 1430   BILITOT 0.3 12/28/2018 1515   GFRNONAA 46 (L) 02/15/2022 1452   GFRAA 75 12/28/2018 1515       Component Value Date/Time   WBC 6.6 02/15/2022 1452   RBC 4.17 02/15/2022 1452   HGB 12.3 02/15/2022 1452   HCT 37.2  02/15/2022 1452   PLT 258 02/15/2022 1452   MCV 89.2 02/15/2022 1452   MCH 29.5 02/15/2022 1452   MCHC 33.1 02/15/2022 1452   RDW 13.0 02/15/2022 1452   LYMPHSABS 1.2 08/07/2021 1430   MONOABS 0.8 08/07/2021 1430   EOSABS 0.3 08/07/2021 1430   BASOSABS 0.1 08/07/2021 1430    No results found for: "POCLITH", "LITHIUM"   No results found for: "PHENYTOIN", "PHENOBARB", "VALPROATE", "CBMZ"   .res Assessment: Plan:    Plan:  PDMP reviewed   Add Hydroxyzine 25mg  - 1 to 2 at hs as needed for sleep.  RTC 4 weeks  Time spent with patient was 20 minutes. Greater than 50% of face to face time with patient was spent on counseling and coordination of care.      Patient advised to contact office with any questions, adverse effects, or acute worsening in signs and symptoms.  Discussed potential benefits, risk, and side effects of benzodiazepines to include potential risk of tolerance and dependence, as well as possible drowsiness.  Advised patient not to drive if experiencing drowsiness and to take lowest possible effective dose to minimize risk of dependence and tolerance.    Diagnoses and all orders for this visit:  Major depressive disorder, recurrent episode, moderate (HCC)  Generalized anxiety disorder  Insomnia, unspecified type -     hydrOXYzine (VISTARIL) 25 MG capsule; Take one to two capsules at bedtime.     Please see After Visit Summary for patient specific instructions.  Future Appointments  Date Time Provider Department Center  05/02/2023 11:00 AM Judyann Munson, MD [facility]  07/05/2023 11:40 AM Jens Som Madolyn Frieze, MD [facility] None    No orders of the defined types were placed in this encounter.   -------------------------------

## 2023-03-31 ENCOUNTER — Telehealth: Payer: Self-pay | Admitting: Adult Health

## 2023-03-31 NOTE — Telephone Encounter (Signed)
Pt

## 2023-03-31 NOTE — Telephone Encounter (Signed)
Patient took 25 mg and about 2 hours later took another 25 mg. Reported she woke up with leg cramps. Told her that she needed more than 1 night to get an idea if medication would work. Told her to take 2 tablets tonight and if no cramps continue the 2 tablets. She will let us know Monday how she is doing.

## 2023-03-31 NOTE — Telephone Encounter (Signed)
Was seen yesterday.She was prescribed a sleep medication. She lvm that it didn't work and she didn;t have a good night. She would to talk to gina about trying a different medication. Please call her at [phone]

## 2023-04-05 ENCOUNTER — Encounter (HOSPITAL_BASED_OUTPATIENT_CLINIC_OR_DEPARTMENT_OTHER): Payer: Self-pay | Admitting: Emergency Medicine

## 2023-04-05 ENCOUNTER — Emergency Department (HOSPITAL_BASED_OUTPATIENT_CLINIC_OR_DEPARTMENT_OTHER)
Admission: EM | Admit: 2023-04-05 | Discharge: 2023-04-05 | Disposition: A | Discharge status: Home / Self Care | Payer: Medicare PPO | Attending: Emergency Medicine | Admitting: Emergency Medicine

## 2023-04-05 ENCOUNTER — Other Ambulatory Visit: Payer: Self-pay

## 2023-04-05 ENCOUNTER — Other Ambulatory Visit (HOSPITAL_BASED_OUTPATIENT_CLINIC_OR_DEPARTMENT_OTHER): Payer: Self-pay

## 2023-04-05 DIAGNOSIS — N3000 Acute cystitis without hematuria: Secondary | ICD-10-CM | POA: Diagnosis not present

## 2023-04-05 DIAGNOSIS — R300 Dysuria: Secondary | ICD-10-CM | POA: Diagnosis present

## 2023-04-05 LAB — URINALYSIS, ROUTINE W REFLEX MICROSCOPIC
Bacteria, UA: NONE SEEN
Bilirubin Urine: NEGATIVE
Glucose, UA: NEGATIVE mg/dL
Hgb urine dipstick: NEGATIVE
Ketones, ur: NEGATIVE mg/dL
Leukocytes,Ua: NEGATIVE
Nitrite: POSITIVE — AB
Protein, ur: NEGATIVE mg/dL
Specific Gravity, Urine: <1.005 — ABNORMAL LOW (ref 1.005–1.030)
pH: 7 (ref 5.0–8.0)

## 2023-04-05 MED ORDER — FOSFOMYCIN TROMETHAMINE 3 G PO PACK: 3.0000 g | PACK | Freq: Once | ORAL | 0 refills | Status: AC

## 2023-04-05 NOTE — ED Notes (Signed)
Discharge paperwork given and verbally understood. 

## 2023-04-05 NOTE — ED Triage Notes (Signed)
Pt arrives pov, to triage in wheelchair with c/o Dysuria that worsened last night. Reports recurrent UTIs. Denies hematuria. Pt reports nausea and body aches

## 2023-04-05 NOTE — ED Provider Notes (Signed)
[place] EMERGENCY DEPARTMENT AT [facility] Provider Note   CSN: [id number] Arrival date & time: 04/05/23  1042     History  Chief Complaint  Patient presents with   Dysuria    Debbie Wells is a 82 y.o. female with a PMHx of recurrent UTIs, who presents to the ED with concerns for dysuria onset last night. She notes history of recurrent UTIs and has been evaluated by [facility] for her symptoms. Has associated nausea, body aches. No meds tried at home. Hasn't been on antibiotics recently. Was also evaluated by ID and was prescribed Hiprex.   The history is provided by the patient. No language interpreter was used.       Home Medications Prior to Admission medications   Medication Sig Start Date End Date Taking? Authorizing Provider  estradiol (ESTRACE) 0.1 MG/GM vaginal cream Place 1 Applicatorful vaginally 3 (three) times a week. 02/28/23  Yes [provider]  fosfomycin (MONUROL) 3 g PACK Take 3 g by mouth once for 1 dose. 04/05/23 04/05/23 Yes Levoy Geisen A, PA-C  ALPRAZolam (XANAX) 0.5 MG tablet Take 1 tablet by mouth 3 (three) times daily as needed. 10/22/20   [provider]  amLODipine (NORVASC) 2.5 MG tablet Take 2.5 mg by mouth daily.    [provider]  aspirin 81 MG tablet Take 81 mg by mouth daily.     [provider]  busPIRone (BUSPAR) 15 MG tablet Take 15 mg by mouth 2 (two) times daily. 12/03/20   [provider]  cephALEXin (KEFLEX) 250 MG capsule Take 250 mg by mouth daily.    [provider]  DULoxetine (CYMBALTA) 60 MG capsule Take 60 mg by mouth every evening. 06/18/22   [provider]  gabapentin (NEURONTIN) 100 MG capsule Take 100 mg by mouth 2 (two) times daily. 10/22/20   [provider]  hydrOXYzine (VISTARIL) 25 MG capsule Take one to two capsules at bedtime. 03/29/23   Mozingo, Thereasa Solo, NP  levothyroxine (SYNTHROID) 100 MCG tablet Take 100 mcg by mouth daily.  06/18/22   [provider]  losartan (COZAAR) 50 MG tablet Take 50 mg by mouth 2 (two) times daily. 09/04/20   [provider]  methenamine (HIPREX) 1 g tablet Take 1 tablet (1 g total) by mouth 2 (two) times daily with a meal. 03/17/23 05/16/23  Kathlynn Grate, DO  metoprolol succinate (TOPROL-XL) 25 MG 24 hr tablet TAKE 1/2 TABLET BY MOUTH EVERY DAY Patient taking differently: Take 12.5 mg by mouth daily. 09/12/20   Lewayne Bunting, MD  Multiple Vitamin (MULTIVITAMIN) capsule Take 1 capsule by mouth daily.    [provider]  Omega-3 Fatty Acids (FISH OIL PO) Take 1 tablet by mouth daily.    [provider]  phenazopyridine (PYRIDIUM) 200 MG tablet Take 200 mg by mouth 3 (three) times daily. 02/28/23   [provider]  polyethylene glycol (MIRALAX / GLYCOLAX) packet Take 17 g by mouth daily as needed for mild constipation.    [provider]  senna-docusate (SENOKOT-S) 8.6-50 MG tablet Take 1 tablet by mouth at bedtime. Patient taking differently: Take 2 tablets by mouth at bedtime. 09/10/20   Milagros Loll, MD  zolpidem (AMBIEN) 5 MG tablet Take 5 mg by mouth at bedtime. 02/08/23   [provider]      Allergies    Hydrocodone-acetaminophen, Iodinated contrast media, Sulfa antibiotics, Codeine, Hydrocodone-acetaminophen, Sulfonamide derivatives, Terbinafine, and Benazepril    Review of  Systems   Review of Systems  All other systems reviewed and are negative.   Physical Exam Updated Vital Signs BP (!) 181/81 (BP Location: Right Arm)   Pulse 77   Temp 98.6 F (37 C) (Oral)   Resp 18   SpO2 97%  Physical Exam Vitals and nursing note reviewed.  Constitutional:      General: She is not in acute distress.    Appearance: She is not diaphoretic.     Comments: Tearful intermittently when discussing her symptoms.  HENT:     Head: Normocephalic and atraumatic.     Mouth/Throat:     Pharynx: No oropharyngeal exudate.   Eyes:     General: No scleral icterus.    Conjunctiva/sclera: Conjunctivae normal.  Cardiovascular:     Rate and Rhythm: Normal rate and regular rhythm.     Pulses: Normal pulses.     Heart sounds: Normal heart sounds.  Pulmonary:     Effort: Pulmonary effort is normal. No respiratory distress.     Breath sounds: Normal breath sounds. No wheezing.  Abdominal:     General: Bowel sounds are normal.     Palpations: Abdomen is soft. There is no mass.     Tenderness: There is abdominal tenderness in the suprapubic area. There is no right CVA tenderness, left CVA tenderness, guarding or rebound.     Comments: Suprapubic abdominal TTP. No CVA TTP noted bilaterally.  Musculoskeletal:        General: Normal range of motion.     Cervical back: Normal range of motion and neck supple.  Skin:    General: Skin is warm and dry.  Neurological:     Mental Status: She is alert.  Psychiatric:        Behavior: Behavior normal.     ED Results / Procedures / Treatments   Labs (all labs ordered are listed, but only abnormal results are displayed) Labs Reviewed  URINALYSIS, ROUTINE W REFLEX MICROSCOPIC - Abnormal; Notable for the following components:      Result Value   Specific Gravity, Urine <1.005 (*)    Nitrite POSITIVE (*)    All other components within normal limits  URINE CULTURE    EKG None  Radiology No results found.  Procedures Procedures    Medications Ordered in ED Medications - No data to display  ED Course/ Medical Decision Making/ A&P Clinical Course as of 04/05/23 1443  Tue Apr 05, 2023  1143 Nitrite(!): POSITIVE [SB]  1240 Discussed with patient urine findings [SB]  1250 Discussed with ED pharmacist. Jonny Ruiz who recommends fosfomycin at this time and follow up with Urology due to concerns from ID regarding patient with drug resistant bacteria. [SB]  1435 Discussed with pt and husband regarding recommendations as per ED pharmacist, answered all available questions.  Pt appears safe for discharge at this time.  [SB]    Clinical Course User Index [SB] Jacki Couse A, PA-C                             Medical Decision Making Amount and/or Complexity of Data Reviewed Labs: ordered. Decision-making details documented in ED Course.  Risk Prescription drug management.   Pt presents with concerns for dysuria onset last night.  Has a history of recurrent UTIs.  Patient afebrile.on exam patient without CVA tenderness to palpation noted bilaterally.  Mild suprapubic abdominal TTP. No acute cardiovascular, respiratory, abdominal exam findings.  Differential diagnosis includes  acute cystitis, pyelonephritis, nephrolithiasis.   Co morbidities that complicate the patient evaluation: Recurrent UTIs  Additional history obtained:  Additional history obtained from Spouse/Significant Other External records from outside source obtained and reviewed including: Patient was evaluated by infectious disease on 03/17/2023 due to her history of recurrent UTIs.  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  Patient was sent with a prescription for Hip-Rex.  Labs:  I ordered, and personally interpreted labs.  The pertinent results include:   Urinalysis nitrate positive.  Urine culture sent with results pending at time of discharge.  Disposition: Presenting suspicious for acute cystitis.  Doubt concerns this time for pyelonephritis or nephrolithiasis.  Lengthy discussion held with patient and husband at bedside regarding concerns per infectious disease specialist regarding drug-resistant bacteria in the setting of chronic and recurrent antibiotic use.  Also gave recommendations as per ED pharmacist regards to treatment with fosfomycin as well as follow-up with urology.  Patient has an appointment with her  urologist on 04/14/2023.  After consideration of the diagnostic results and the patients response to treatment, I feel that the patient would benefit from Discharge home.  Patient provided with a prescription for fosfomycin.  Supportive care measures and strict return precautions discussed with patient at bedside. Pt acknowledges and verbalizes understanding. Pt appears safe for discharge. Follow up as indicated in discharge paperwork.    This chart was dictated using voice recognition software, Dragon. Despite the best efforts of this provider to proofread and correct errors, errors may still occur which can change documentation meaning.   Final Clinical Impression(s) / ED Diagnoses Final diagnoses:  Acute cystitis without hematuria    Rx / DC Orders ED Discharge Orders          Ordered    fosfomycin (MONUROL) 3 g PACK   Once        04/05/23 1415              Hayde Kilgour A, PA-C 04/05/23 1443    Rexford Maus, DO 04/05/23 1553

## 2023-04-05 NOTE — ED Notes (Signed)
ED Provider at bedside. 

## 2023-04-05 NOTE — Discharge Instructions (Signed)
It was a pleasure taking care of you today!   Your urine was notable for a UTI. Your urine will be sent for culture. You will be sent a prescription for fosfomycin, take as prescribed. Ensure that you complete the entire course of antibiotic. Ensure to maintain fluid intake.  Maintain your follow up with your urologist as scheduled for next week. You may follow-up with your primary care provider as needed.  Return to the emergency department if you are experiencing increasing or worsening abdominal pain, urinary symptoms, fever, or decreased fluid intake.

## 2023-04-06 LAB — URINE CULTURE: Culture: <10000 — AB

## 2023-04-08 ENCOUNTER — Telehealth: Payer: Self-pay | Admitting: *Deleted

## 2023-04-08 NOTE — Telephone Encounter (Signed)
Transition Care Management Unsuccessful Follow-up Telephone Call  Date of discharge and from where:  [facility] 04/05/2023  Attempts:  1st Attempt  Reason for unsuccessful TCM follow-up call:  No answer/busy

## 2023-04-12 ENCOUNTER — Telehealth: Payer: Self-pay | Admitting: *Deleted

## 2023-04-12 NOTE — Telephone Encounter (Signed)
Transition Care Management Unsuccessful Follow-up Telephone Call  Date of discharge and from where:  04/05/2023 [facility]   Attempts:  2nd Attempt  Reason for unsuccessful TCM follow-up call:  Left voice message

## 2023-05-02 ENCOUNTER — Ambulatory Visit: Payer: Medicare PPO | Admitting: Internal Medicine

## 2023-05-19 DIAGNOSIS — R8271 Bacteriuria: Secondary | ICD-10-CM | POA: Diagnosis not present

## 2023-05-19 DIAGNOSIS — N3020 Other chronic cystitis without hematuria: Secondary | ICD-10-CM | POA: Diagnosis not present

## 2023-06-02 DIAGNOSIS — N952 Postmenopausal atrophic vaginitis: Secondary | ICD-10-CM | POA: Diagnosis not present

## 2023-06-02 DIAGNOSIS — N390 Urinary tract infection, site not specified: Secondary | ICD-10-CM | POA: Diagnosis not present

## 2023-06-02 DIAGNOSIS — E039 Hypothyroidism, unspecified: Secondary | ICD-10-CM | POA: Diagnosis not present

## 2023-06-02 DIAGNOSIS — G4700 Insomnia, unspecified: Secondary | ICD-10-CM | POA: Diagnosis not present

## 2023-06-02 DIAGNOSIS — F331 Major depressive disorder, recurrent, moderate: Secondary | ICD-10-CM | POA: Diagnosis not present

## 2023-06-02 DIAGNOSIS — F419 Anxiety disorder, unspecified: Secondary | ICD-10-CM | POA: Diagnosis not present

## 2023-06-09 DIAGNOSIS — Z87440 Personal history of urinary (tract) infections: Secondary | ICD-10-CM | POA: Diagnosis not present

## 2023-06-09 DIAGNOSIS — N390 Urinary tract infection, site not specified: Secondary | ICD-10-CM | POA: Diagnosis not present

## 2023-06-09 DIAGNOSIS — N951 Menopausal and female climacteric states: Secondary | ICD-10-CM | POA: Diagnosis not present

## 2023-06-11 ENCOUNTER — Encounter (HOSPITAL_COMMUNITY): Payer: Self-pay | Admitting: Internal Medicine

## 2023-06-11 ENCOUNTER — Other Ambulatory Visit: Payer: Self-pay

## 2023-06-11 ENCOUNTER — Inpatient Hospital Stay (HOSPITAL_COMMUNITY)
Admission: EM | Admit: 2023-06-11 | Discharge: 2023-06-15 | DRG: 641 | Disposition: A | Discharge status: Home / Self Care | Payer: Medicare PPO | Attending: Family Medicine | Admitting: Family Medicine

## 2023-06-11 DIAGNOSIS — Z823 Family history of stroke: Secondary | ICD-10-CM

## 2023-06-11 DIAGNOSIS — N179 Acute kidney failure, unspecified: Secondary | ICD-10-CM | POA: Diagnosis present

## 2023-06-11 DIAGNOSIS — K7689 Other specified diseases of liver: Secondary | ICD-10-CM | POA: Diagnosis not present

## 2023-06-11 DIAGNOSIS — Z1152 Encounter for screening for COVID-19: Secondary | ICD-10-CM | POA: Diagnosis not present

## 2023-06-11 DIAGNOSIS — Z79899 Other long term (current) drug therapy: Secondary | ICD-10-CM

## 2023-06-11 DIAGNOSIS — R300 Dysuria: Secondary | ICD-10-CM | POA: Diagnosis present

## 2023-06-11 DIAGNOSIS — Z91041 Radiographic dye allergy status: Secondary | ICD-10-CM

## 2023-06-11 DIAGNOSIS — Z885 Allergy status to narcotic agent status: Secondary | ICD-10-CM

## 2023-06-11 DIAGNOSIS — E8809 Other disorders of plasma-protein metabolism, not elsewhere classified: Secondary | ICD-10-CM | POA: Diagnosis present

## 2023-06-11 DIAGNOSIS — Z87440 Personal history of urinary (tract) infections: Secondary | ICD-10-CM | POA: Diagnosis present

## 2023-06-11 DIAGNOSIS — E871 Hypo-osmolality and hyponatremia: Secondary | ICD-10-CM | POA: Diagnosis not present

## 2023-06-11 DIAGNOSIS — N281 Cyst of kidney, acquired: Secondary | ICD-10-CM | POA: Diagnosis not present

## 2023-06-11 DIAGNOSIS — Z882 Allergy status to sulfonamides status: Secondary | ICD-10-CM | POA: Diagnosis not present

## 2023-06-11 DIAGNOSIS — E861 Hypovolemia: Secondary | ICD-10-CM | POA: Diagnosis present

## 2023-06-11 DIAGNOSIS — Z7982 Long term (current) use of aspirin: Secondary | ICD-10-CM

## 2023-06-11 DIAGNOSIS — Z87891 Personal history of nicotine dependence: Secondary | ICD-10-CM | POA: Diagnosis not present

## 2023-06-11 DIAGNOSIS — F419 Anxiety disorder, unspecified: Secondary | ICD-10-CM | POA: Diagnosis present

## 2023-06-11 DIAGNOSIS — Z888 Allergy status to other drugs, medicaments and biological substances status: Secondary | ICD-10-CM

## 2023-06-11 DIAGNOSIS — G629 Polyneuropathy, unspecified: Secondary | ICD-10-CM | POA: Diagnosis present

## 2023-06-11 DIAGNOSIS — Z6826 Body mass index (BMI) 26.0-26.9, adult: Secondary | ICD-10-CM

## 2023-06-11 DIAGNOSIS — R339 Retention of urine, unspecified: Secondary | ICD-10-CM | POA: Diagnosis present

## 2023-06-11 DIAGNOSIS — Z8249 Family history of ischemic heart disease and other diseases of the circulatory system: Secondary | ICD-10-CM | POA: Diagnosis not present

## 2023-06-11 DIAGNOSIS — R4589 Other symptoms and signs involving emotional state: Secondary | ICD-10-CM | POA: Diagnosis not present

## 2023-06-11 DIAGNOSIS — E872 Acidosis, unspecified: Secondary | ICD-10-CM | POA: Diagnosis present

## 2023-06-11 DIAGNOSIS — R7989 Other specified abnormal findings of blood chemistry: Secondary | ICD-10-CM | POA: Diagnosis not present

## 2023-06-11 DIAGNOSIS — F5104 Psychophysiologic insomnia: Secondary | ICD-10-CM | POA: Diagnosis present

## 2023-06-11 DIAGNOSIS — E039 Hypothyroidism, unspecified: Secondary | ICD-10-CM | POA: Diagnosis present

## 2023-06-11 DIAGNOSIS — D649 Anemia, unspecified: Secondary | ICD-10-CM | POA: Diagnosis present

## 2023-06-11 DIAGNOSIS — F331 Major depressive disorder, recurrent, moderate: Secondary | ICD-10-CM | POA: Diagnosis present

## 2023-06-11 DIAGNOSIS — Z79890 Hormone replacement therapy (postmenopausal): Secondary | ICD-10-CM

## 2023-06-11 DIAGNOSIS — F29 Unspecified psychosis not due to a substance or known physiological condition: Secondary | ICD-10-CM | POA: Diagnosis not present

## 2023-06-11 DIAGNOSIS — Z800 Family history of malignant neoplasm of digestive organs: Secondary | ICD-10-CM | POA: Diagnosis not present

## 2023-06-11 DIAGNOSIS — E785 Hyperlipidemia, unspecified: Secondary | ICD-10-CM | POA: Diagnosis present

## 2023-06-11 DIAGNOSIS — E663 Overweight: Secondary | ICD-10-CM | POA: Diagnosis present

## 2023-06-11 DIAGNOSIS — Z90710 Acquired absence of both cervix and uterus: Secondary | ICD-10-CM

## 2023-06-11 DIAGNOSIS — R509 Fever, unspecified: Secondary | ICD-10-CM | POA: Diagnosis not present

## 2023-06-11 DIAGNOSIS — I10 Essential (primary) hypertension: Secondary | ICD-10-CM | POA: Diagnosis present

## 2023-06-11 DIAGNOSIS — Z841 Family history of disorders of kidney and ureter: Secondary | ICD-10-CM | POA: Diagnosis not present

## 2023-06-11 LAB — CBC
HCT: 42.2 % (ref 36.0–46.0)
Hemoglobin: 14.5 g/dL (ref 12.0–15.0)
MCH: 30.6 pg (ref 26.0–34.0)
MCHC: 34.4 g/dL (ref 30.0–36.0)
MCV: 89 fL (ref 80.0–100.0)
Platelets: 273 10*3/uL (ref 150–400)
RBC: 4.74 MIL/uL (ref 3.87–5.11)
RDW: 12.4 % (ref 11.5–15.5)
WBC: 5.8 10*3/uL (ref 4.0–10.5)
nRBC: 0 % (ref 0.0–0.2)

## 2023-06-11 LAB — URINALYSIS, ROUTINE W REFLEX MICROSCOPIC
Bacteria, UA: NONE SEEN
Bilirubin Urine: NEGATIVE
Glucose, UA: NEGATIVE mg/dL
Hgb urine dipstick: NEGATIVE
Ketones, ur: 5 mg/dL — AB
Nitrite: NEGATIVE
Protein, ur: NEGATIVE mg/dL
Specific Gravity, Urine: 1.005 (ref 1.005–1.030)
pH: 7 (ref 5.0–8.0)

## 2023-06-11 LAB — BASIC METABOLIC PANEL
Anion gap: 10 (ref 5–15)
Anion gap: 8 (ref 5–15)
BUN: 8 mg/dL (ref 8–23)
BUN: 9 mg/dL (ref 8–23)
CO2: 23 mmol/L (ref 22–32)
CO2: 23 mmol/L (ref 22–32)
Calcium: 9.6 mg/dL (ref 8.9–10.3)
Calcium: 8.9 mg/dL (ref 8.9–10.3)
Chloride: 87 mmol/L — ABNORMAL LOW (ref 98–111)
Chloride: 92 mmol/L — ABNORMAL LOW (ref 98–111)
Creatinine, Ser: 0.89 mg/dL (ref 0.44–1.00)
Creatinine, Ser: 0.86 mg/dL (ref 0.44–1.00)
GFR, Estimated: >60 mL/min (ref >60)
GFR, Estimated: >60 mL/min (ref >60)
Glucose, Bld: 117 mg/dL — ABNORMAL HIGH (ref 70–99)
Glucose, Bld: 158 mg/dL — ABNORMAL HIGH (ref 70–99)
Potassium: 4.4 mmol/L (ref 3.5–5.1)
Potassium: 3.7 mmol/L (ref 3.5–5.1)
Sodium: 120 mmol/L — ABNORMAL LOW (ref 135–145)
Sodium: 123 mmol/L — ABNORMAL LOW (ref 135–145)

## 2023-06-11 LAB — NA AND K (SODIUM & POTASSIUM), RAND UR
Potassium Urine: 23 mmol/L
Sodium, Ur: 38 mmol/L

## 2023-06-11 LAB — SARS CORONAVIRUS 2 BY RT PCR: SARS Coronavirus 2 by RT PCR: NEGATIVE

## 2023-06-11 LAB — TSH: TSH: 1.46 u[IU]/mL (ref 0.350–4.500)

## 2023-06-11 LAB — OSMOLALITY: Osmolality: 257 mOsm/kg — ABNORMAL LOW (ref 275–295)

## 2023-06-11 LAB — CREATININE, URINE, RANDOM: Creatinine, Urine: 54 mg/dL

## 2023-06-11 LAB — T4, FREE: Free T4: 1.69 ng/dL — ABNORMAL HIGH (ref 0.61–1.12)

## 2023-06-11 MED ORDER — BOOST / RESOURCE BREEZE PO LIQD CUSTOM
1.0000 | Freq: Three times a day (TID) | ORAL | Status: DC
  Administered 2023-06-11 – 2023-06-14 (×6): 1 via ORAL

## 2023-06-11 MED ORDER — ONDANSETRON HCL 4 MG PO TABS: 4.0000 mg | ORAL_TABLET | Freq: Four times a day (QID) | ORAL | Status: DC | PRN

## 2023-06-11 MED ORDER — ONDANSETRON HCL 4 MG/2ML IJ SOLN: 4.0000 mg | Freq: Four times a day (QID) | INTRAMUSCULAR | Status: DC | PRN

## 2023-06-11 MED ORDER — SODIUM CHLORIDE 0.9 % IV BOLUS
1000.0000 mL | Freq: Once | INTRAVENOUS | Status: AC
  Administered 2023-06-11: 1000 mL via INTRAVENOUS

## 2023-06-11 MED ORDER — ACETAMINOPHEN 325 MG PO TABS
650.0000 mg | ORAL_TABLET | Freq: Four times a day (QID) | ORAL | Status: DC | PRN
  Administered 2023-06-12 – 2023-06-13 (×3): 650 mg via ORAL
  Filled 2023-06-11 (×2): qty 2

## 2023-06-11 MED ORDER — FENTANYL CITRATE PF 50 MCG/ML IJ SOSY
25.0000 ug | PREFILLED_SYRINGE | Freq: Once | INTRAMUSCULAR | Status: DC
  Administered 2023-06-11: 25 ug via INTRAVENOUS
  Filled 2023-06-11: qty 1

## 2023-06-11 MED ORDER — POLYETHYLENE GLYCOL 3350 17 G PO PACK
17.0000 g | PACK | Freq: Every day | ORAL | Status: DC | PRN
  Administered 2023-06-12: 17 g via ORAL
  Filled 2023-06-11 (×2): qty 1

## 2023-06-11 MED ORDER — LOSARTAN POTASSIUM 25 MG PO TABS
50.0000 mg | ORAL_TABLET | Freq: Once | ORAL | Status: AC
  Administered 2023-06-11: 50 mg via ORAL
  Filled 2023-06-11: qty 2

## 2023-06-11 MED ORDER — GABAPENTIN 100 MG PO CAPS
200.0000 mg | ORAL_CAPSULE | Freq: Two times a day (BID) | ORAL | Status: DC
  Administered 2023-06-11 – 2023-06-12 (×2): 200 mg via ORAL
  Filled 2023-06-11 (×2): qty 2

## 2023-06-11 MED ORDER — AMLODIPINE BESYLATE 5 MG PO TABS
2.5000 mg | ORAL_TABLET | Freq: Every day | ORAL | Status: DC
  Administered 2023-06-12 – 2023-06-15 (×4): 2.5 mg via ORAL
  Filled 2023-06-11 (×4): qty 1

## 2023-06-11 MED ORDER — CLONIDINE HCL 0.1 MG PO TABS: 0.1000 mg | ORAL_TABLET | ORAL | Status: DC | PRN

## 2023-06-11 MED ORDER — LOSARTAN POTASSIUM 50 MG PO TABS
50.0000 mg | ORAL_TABLET | Freq: Two times a day (BID) | ORAL | Status: DC
  Administered 2023-06-13 – 2023-06-15 (×4): 50 mg via ORAL
  Filled 2023-06-11 (×4): qty 1

## 2023-06-11 MED ORDER — GABAPENTIN 300 MG PO CAPS
300.0000 mg | ORAL_CAPSULE | Freq: Once | ORAL | Status: AC | PRN
  Administered 2023-06-11: 300 mg via ORAL
  Filled 2023-06-11: qty 1

## 2023-06-11 MED ORDER — ALPRAZOLAM 0.5 MG PO TABS
0.5000 mg | ORAL_TABLET | Freq: Once | ORAL | Status: AC
  Administered 2023-06-11: 0.5 mg via ORAL
  Filled 2023-06-11: qty 1

## 2023-06-11 MED ORDER — ZOLPIDEM TARTRATE 5 MG PO TABS
5.0000 mg | ORAL_TABLET | Freq: Every day | ORAL | Status: DC
  Administered 2023-06-11: 5 mg via ORAL
  Filled 2023-06-11 (×3): qty 1

## 2023-06-11 MED ORDER — METOPROLOL SUCCINATE ER 25 MG PO TB24
12.5000 mg | ORAL_TABLET | Freq: Every day | ORAL | Status: DC
  Administered 2023-06-12 – 2023-06-15 (×4): 12.5 mg via ORAL
  Filled 2023-06-11 (×4): qty 1

## 2023-06-11 MED ORDER — ALPRAZOLAM 0.5 MG PO TABS
0.5000 mg | ORAL_TABLET | Freq: Three times a day (TID) | ORAL | Status: DC
  Administered 2023-06-11 – 2023-06-15 (×11): 0.5 mg via ORAL
  Filled 2023-06-11 (×11): qty 1

## 2023-06-11 MED ORDER — MELATONIN 5 MG PO TABS: 10.0000 mg | ORAL_TABLET | Freq: Every evening | ORAL | Status: DC | PRN

## 2023-06-11 MED ORDER — LEVOTHYROXINE SODIUM 100 MCG PO TABS
100.0000 ug | ORAL_TABLET | Freq: Every day | ORAL | Status: DC
  Administered 2023-06-12 – 2023-06-15 (×4): 100 ug via ORAL
  Filled 2023-06-11 (×4): qty 1

## 2023-06-11 MED ORDER — BUSPIRONE HCL 5 MG PO TABS
15.0000 mg | ORAL_TABLET | Freq: Two times a day (BID) | ORAL | Status: DC
  Administered 2023-06-11 – 2023-06-15 (×8): 15 mg via ORAL
  Filled 2023-06-11 (×9): qty 1

## 2023-06-11 MED ORDER — FOSFOMYCIN TROMETHAMINE 3 G PO PACK
3.0000 g | PACK | Freq: Once | ORAL | Status: AC
  Administered 2023-06-11: 3 g via ORAL
  Filled 2023-06-11: qty 3

## 2023-06-11 MED ORDER — ACETAMINOPHEN 325 MG PO TABS: 650.0000 mg | ORAL_TABLET | Freq: Once | ORAL | Status: DC

## 2023-06-11 MED ORDER — ACETAMINOPHEN 650 MG RE SUPP: 650.0000 mg | Freq: Four times a day (QID) | RECTAL | Status: DC | PRN

## 2023-06-11 MED ORDER — SENNOSIDES-DOCUSATE SODIUM 8.6-50 MG PO TABS
2.0000 | ORAL_TABLET | Freq: Every day | ORAL | Status: DC
  Administered 2023-06-11 – 2023-06-14 (×4): 2 via ORAL
  Filled 2023-06-11 (×4): qty 2

## 2023-06-11 NOTE — ED Triage Notes (Signed)
Pt BIBA from [facility]. Pt c/o sensation on burning in whole body. Poss of UTI.  AOX4

## 2023-06-11 NOTE — Plan of Care (Signed)

## 2023-06-11 NOTE — Assessment & Plan Note (Signed)
Patient on multiple medications for insomnia.

## 2023-06-11 NOTE — H&P (Signed)
History and Physical    Debbie Wells ZOX:[id number] DOB: 10/09/1941 DOA: 06/11/2023  DOS: the patient was seen and examined on 06/11/2023  PCP: Adrian Prince, MD   Patient coming from: Home  I have personally briefly reviewed patient's old medical records in [place] [facility]  CC: burning over her whole body HPI: 82 year old female history of major depression, anxiety, hypertension, chronic insomnia, recurrent UTIs, presents to the ER today with complaints of sensation of h patient took 1 dose of Lexapro last night and woke up this morning with burning sensation on her skin.  No rashes.  No fever.  Patient's does state she has not felt well for several weeks.  Solid food intake has been poor.  She is still drinking water.  She has been on multiple antidepressants in the past.  She refused to take Cymbalta due to weight gain.  She has not had chemistries in about a year.  Patient is hyperfocused on her urinary symptoms.  She states that she has burning with urination.  She "knows" she has another UTI.  This is despite being on prophylactic Keflex 250 mg daily.  She is followed by Dr. Marlou Porch with [facility].  7120, potassium 4.4, chloride of 87, BUN of 8, creatinine 0.89, glucose 117  UA shows specific gravity 1.005, ketones of 5, trace leukocyte Estrace.  Negative nitrites.  Negative bacteria.  No white cells.  White count 5.8, hemoglobin 14.5, platelets of 273  Urine sodium of 38, potassium 23  Due to the patient's sodium of 120, [facility] hospitalist contacted for admission.     ED Course: sodium 120. Urine Na 38. Urine specific gravity 1.005. ketones 5  Review of Systems:  Review of Systems  Constitutional:  Positive for malaise/fatigue.  HENT: Negative.    Eyes: Negative.   Respiratory: Negative.    Cardiovascular: Negative.   Gastrointestinal: Negative.   Genitourinary:  Positive for dysuria. Negative for hematuria.  Musculoskeletal: Negative.   Skin:  Positive for  rash.       Burning sensation to her skin. No rashes  Neurological: Negative.   Endo/Heme/Allergies: Negative.   Psychiatric/Behavioral:  Positive for depression. The patient is nervous/anxious and has insomnia.        Anhedonia  All other systems reviewed and are negative.   Past Medical History:  Diagnosis Date   Adenomatous colon polyp    Adenomatous polyp of colon 12/03/2004   6mm   Anxiety    Chronic headaches    Chronic insomnia    Depression    Diverticulosis    External hemorrhoids    Hyperlipidemia    Hypertension    Hyperthyroidism    Hypothyroidism    Umbilical hernia     Past Surgical History:  Procedure Laterality Date   ABDOMINAL HYSTERECTOMY  1975   APPENDECTOMY  1975   COLONOSCOPY  03/03/2010,12/03/2004   ESOPHAGOGASTRODUODENOSCOPY  12/16/11   Dr. Stan Head   OOPHORECTOMY     spinal lipoma removed  2006   TOTAL ABDOMINAL HYSTERECTOMY       reports that she quit smoking about 30 years ago. Her smoking use included cigarettes. She has never used smokeless tobacco. She reports current alcohol use of about 2.0 standard drinks of alcohol per week. She reports that she does not use drugs.  Allergies  Allergen Reactions   Hydrocodone-Acetaminophen Rash    Vicodin=REACTION: rash   Iodinated Contrast Media Hives and Rash    Per patient she has developed hives and itching in  upper trunk of body after last 2 CT scans once she got homes that she has just treated at home with benadryl. She has not told anyone until now. States she was able to bear it  Other reaction(s): Rash Per patient she has developed hives and itching in upper trunk of body after last 2 CT scans once she got homes that she has just treated at home with benadryl. She has not told anyone until now. States she was able to bear it    Sulfa Antibiotics Rash    REACTION: rash Other reaction(s): Unknown   Codeine     Other reaction(s): Unknown   Hydrocodone-Acetaminophen     Vicodin=REACTION: rash    Sulfonamide Derivatives     REACTION: rash   Terbinafine     Other reaction(s): Unknown   Benazepril Cough    Other reaction(s): Cough    Family History  Problem Relation Age of Onset   Esophageal cancer Father    Stomach cancer Father        mets from esophagus   Heart disease Mother    Irritable bowel syndrome Mother    Kidney disease Mother    Other Mother        cushings disease   Addison's disease Mother    Heart failure Mother    Stroke Mother    Hypertension Mother    Stroke Maternal Grandmother    Hypertension Maternal Grandmother    Colon cancer Neg Hx    Heart attack Neg Hx     Prior to Admission medications   Medication Sig Start Date End Date Taking? Authorizing Provider  ALPRAZolam Prudy Feeler) 0.5 MG tablet Take 1 tablet by mouth 3 (three) times daily as needed. 10/22/20   [provider]  amLODipine (NORVASC) 2.5 MG tablet Take 2.5 mg by mouth daily.    [provider]  busPIRone (BUSPAR) 15 MG tablet Take 15 mg by mouth 2 (two) times daily. 12/03/20   [provider]  cephALEXin (KEFLEX) 250 MG capsule Take 250 mg by mouth daily.    [provider]  estradiol (ESTRACE) 0.1 MG/GM vaginal cream Place 1 Applicatorful vaginally 3 (three) times a week. 02/28/23   [provider]  gabapentin (NEURONTIN) 100 MG capsule Take 200 mg by mouth 2 (two) times daily. 10/22/20   [provider]  hydrOXYzine (VISTARIL) 25 MG capsule Take one to two capsules at bedtime. 03/29/23   Mozingo, Thereasa Solo, NP  levothyroxine (SYNTHROID) 100 MCG tablet Take 100 mcg by mouth daily. 06/18/22   [provider]  losartan (COZAAR) 50 MG tablet Take 50 mg by mouth 2 (two) times daily. 09/04/20   [provider]  metoprolol succinate (TOPROL-XL) 25 MG 24 hr tablet TAKE 1/2 TABLET BY MOUTH EVERY DAY Patient taking differently: Take 12.5 mg by mouth daily. 09/12/20   Lewayne Bunting, MD  Multiple Vitamin (MULTIVITAMIN)  capsule Take 1 capsule by mouth daily.    [provider]  Omega-3 Fatty Acids (FISH OIL PO) Take 1 tablet by mouth daily.    [provider]  phenazopyridine (PYRIDIUM) 200 MG tablet Take 200 mg by mouth 3 (three) times daily. 02/28/23   [provider]  polyethylene glycol (MIRALAX / GLYCOLAX) packet Take 17 g by mouth daily as needed for mild constipation.    [provider]  senna-docusate (SENOKOT-S) 8.6-50 MG tablet Take 1 tablet by mouth at bedtime. Patient taking differently: Take 2 tablets by mouth at bedtime. 09/10/20  Milagros Loll, MD  zolpidem (AMBIEN) 5 MG tablet Take 5 mg by mouth at bedtime. 02/08/23   [provider]    Physical Exam: Vitals:   06/11/23 1330 06/11/23 1645 06/11/23 1700 06/11/23 1730  BP:  (!) 250/102 (!) 206/84 (!) 190/89  Pulse:  69 83 72  Resp:  20 20   Temp:      TempSrc:      SpO2:  98% 97% 96%  Weight: 68 kg     Height: 5\' 4"  (1.626 m)       Physical Exam Vitals and nursing note reviewed.  Constitutional:      General: She is not in acute distress.    Appearance: Normal appearance. She is not ill-appearing, toxic-appearing or diaphoretic.  HENT:     Head: Normocephalic and atraumatic.     Nose: Nose normal.  Eyes:     General: No scleral icterus. Cardiovascular:     Rate and Rhythm: Normal rate and regular rhythm.  Pulmonary:     Effort: Pulmonary effort is normal.     Breath sounds: Normal breath sounds.  Abdominal:     General: Abdomen is flat. Bowel sounds are normal. There is no distension.     Palpations: Abdomen is soft.     Tenderness: There is no abdominal tenderness.  Musculoskeletal:     Right lower leg: No edema.     Left lower leg: No edema.  Skin:    General: Skin is warm and dry.     Capillary Refill: Capillary refill takes less than 2 seconds.  Neurological:     General: No focal deficit present.     Mental Status: She is alert and oriented to person, place, and time.   Psychiatric:        Mood and Affect: Mood is anxious. Affect is flat.      Labs on Admission: I have personally reviewed following labs and imaging studies  CBC: Recent Labs  Lab 06/11/23 1527  WBC 5.8  HGB 14.5  HCT 42.2  MCV 89.0  PLT 273   Basic Metabolic Panel: Recent Labs  Lab 06/11/23 1527  NA 120*  K 4.4  CL 87*  CO2 23  GLUCOSE 117*  BUN 8  CREATININE 0.89  CALCIUM 9.6   GFR: Estimated Creatinine Clearance: 47 mL/min (by C-G formula based on SCr of 0.89 mg/dL).  Lab Results  Component Value Date/Time   NAUR 38 06/11/2023 03:27 PM   Lab Results  Component Value Date/Time   KUR 23 06/11/2023 03:27 PM      Urine analysis:    Component Value Date/Time   COLORURINE YELLOW 06/11/2023 1527   APPEARANCEUR CLEAR 06/11/2023 1527   APPEARANCEUR Clear 08/23/2019 1158   LABSPEC 1.005 06/11/2023 1527   PHURINE 7.0 06/11/2023 1527   GLUCOSEU NEGATIVE 06/11/2023 1527   GLUCOSEU NEGATIVE 11/28/2012 1203   HGBUR NEGATIVE 06/11/2023 1527   BILIRUBINUR NEGATIVE 06/11/2023 1527   BILIRUBINUR Negative 08/23/2019 1158   KETONESUR 5 (A) 06/11/2023 1527   PROTEINUR NEGATIVE 06/11/2023 1527   UROBILINOGEN 0.2 11/28/2012 1203   NITRITE NEGATIVE 06/11/2023 1527   LEUKOCYTESUR TRACE (A) 06/11/2023 1527    Radiological Exams on Admission: I have personally reviewed images No results found.  EKG: My personal interpretation of EKG shows: no EKG to review  Assessment/Plan Principal Problem:   Hyponatremia Active Problems:   Anxiety about health   Moderate recurrent major depression (HCC)   Essential (primary) hypertension   Chronic insomnia  History of recurrent UTIs    Assessment and Plan: * Hyponatremia Observation med/surg bed. Likely due to poor solute intake over the last several weeks. Serum osm, urine osm are being checked. Urine sodium 38. TSH pending. IV NS ordered in ER. Repeat BMP in 4 hours. Pt is euvolemic.  Moderate recurrent major depression  (HCC) Patient states that she has burning over her entire body after taking 1 dose of Lexapro.  Patient is seeing behavioral health as an outpatient.  Will stop her Lexapro for now.  Patient with a very flat affect.  Seems very anxious.  Continue 3 times daily Xanax.  Continue BuSpar twice daily.  Anxiety about health Appears to be chronic. Pt showing symptoms of hypochondriasis.  Continue as needed Xanax.  History of recurrent UTIs UA is quite bland. Pt hyper-focused in her urinary symptoms and is adamant that she has a UTI. Pt given 1 dose of fosfomycin in the ER. No further ABX are required and only place her at risk for developing MDRO infections.  Chronic insomnia Patient on multiple medications for insomnia.  Essential (primary) hypertension BP elevated in the ER.  Patient missed her evening dose of losartan.  Will give that to her now.  Continue on her Norvasc, losartan and Norvasc.   DVT prophylaxis: SQ Heparin Code Status: Full Code Family Communication: no family at bedside  Disposition Plan: return home Consults called: none  Admission status: Observation, Med-Surg   Carollee Herter, DO [facility] 06/11/2023, 5:43 PM

## 2023-06-11 NOTE — Assessment & Plan Note (Signed)
UA is quite bland. Pt hyper-focused in her urinary symptoms and is adamant that she has a UTI. Pt given 1 dose of fosfomycin in the ER. No further ABX are required and only place her at risk for developing MDRO infections.

## 2023-06-11 NOTE — ED Provider Triage Note (Signed)
Emergency Medicine Provider Triage Evaluation Note  Debbie Wells , a 82 y.o. female  was evaluated in triage.  Pt complains of a burning sensation all over her body and pelvis area. Started new medication - Lexapro last night. Pain started last night and has persisted since.  Review of Systems  Positive: Burning sensation at arms and legs Negative: Fever, weakness, loss of sensation  Physical Exam  BP (!) 185/88 (BP Location: Left Arm)   Pulse 71   Temp 97.8 F (36.6 C) (Oral)   Resp (!) 24   Ht 5\' 4"  (1.626 m)   Wt 68 kg   SpO2 95%   BMI 25.73 kg/m  Gen:   Awake, no distress   Resp:  Normal effort  MSK:   Moves extremities without difficulty  Other:    Medical Decision Making  Medically screening exam initiated at 1:50 PM.  Appropriate orders placed.  Debbie Wells was informed that the remainder of the evaluation will be completed by another provider, this initial triage assessment does not replace that evaluation, and the importance of remaining in the ED until their evaluation is complete.   Maxwell Marion, PA-C 06/11/23 1354

## 2023-06-11 NOTE — ED Provider Notes (Signed)
EMERGENCY DEPARTMENT AT [facility] Provider Note   CSN: [id number] Arrival date & time: 06/11/23  1316     History  Chief Complaint  Patient presents with  . Dysuria    Debbie Wells is a 82 y.o. female presenting to the ED with burning pain all over her body since last night.  Patient reports it is worse in her pelvis and she feels very specifically she is having symptoms of UTI.  She reports he has recurring UTIs and is often resolved with antibiotics.  However this sensation is different as she has now having burning of her "my entire body".  She reports she started Lexapro last night and she wonders whether she is having a reaction to this.  She does take gabapentin for neuropathy.  HPI     Home Medications Prior to Admission medications   Medication Sig Start Date End Date Taking? Authorizing Provider  ALPRAZolam Prudy Feeler) 0.5 MG tablet Take 1 tablet by mouth 3 (three) times daily as needed. 10/22/20   [provider]  amLODipine (NORVASC) 2.5 MG tablet Take 2.5 mg by mouth daily.    [provider]  aspirin 81 MG tablet Take 81 mg by mouth daily.     [provider]  busPIRone (BUSPAR) 15 MG tablet Take 15 mg by mouth 2 (two) times daily. 12/03/20   [provider]  cephALEXin (KEFLEX) 250 MG capsule Take 250 mg by mouth daily.    [provider]  DULoxetine (CYMBALTA) 60 MG capsule Take 60 mg by mouth every evening. 06/18/22   [provider]  estradiol (ESTRACE) 0.1 MG/GM vaginal cream Place 1 Applicatorful vaginally 3 (three) times a week. 02/28/23   [provider]  gabapentin (NEURONTIN) 100 MG capsule Take 100 mg by mouth 2 (two) times daily. 10/22/20   [provider]  hydrOXYzine (VISTARIL) 25 MG capsule Take one to two capsules at bedtime. 03/29/23   Mozingo, Thereasa Solo, NP  levothyroxine (SYNTHROID) 100 MCG tablet Take 100 mcg by mouth daily. 06/18/22   [provider]  losartan (COZAAR) 50 MG tablet Take 50 mg by mouth 2 (two) times daily. 09/04/20   [provider]  metoprolol succinate (TOPROL-XL) 25 MG 24 hr tablet TAKE 1/2 TABLET BY MOUTH EVERY DAY Patient taking differently: Take 12.5 mg by mouth daily. 09/12/20   Lewayne Bunting, MD  Multiple Vitamin (MULTIVITAMIN) capsule Take 1 capsule by mouth daily.    [provider]  Omega-3 Fatty Acids (FISH OIL PO) Take 1 tablet by mouth daily.    [provider]  phenazopyridine (PYRIDIUM) 200 MG tablet Take 200 mg by mouth 3 (three) times daily. 02/28/23   [provider]  polyethylene glycol (MIRALAX / GLYCOLAX) packet Take 17 g by mouth daily as needed for mild constipation.    [provider]  senna-docusate (SENOKOT-S) 8.6-50 MG tablet Take 1 tablet by mouth at bedtime. Patient taking differently: Take 2 tablets by mouth at bedtime. 09/10/20   Milagros Loll, MD  zolpidem (AMBIEN) 5 MG tablet Take 5 mg by mouth at bedtime. 02/08/23   [provider]      Allergies    Hydrocodone-acetaminophen, Iodinated contrast media, Sulfa antibiotics, Codeine, Hydrocodone-acetaminophen, Sulfonamide derivatives, Terbinafine, and Benazepril    Review of Systems   Review of Systems  Physical Exam Updated Vital Signs BP (!) 185/88 (BP Location: Left Arm)   Pulse 71   Temp 97.8 F (36.6 C) (Oral)  Resp (!) 24   Ht 5\' 4"  (1.626 m)   Wt 68 kg   SpO2 95%   BMI 25.73 kg/m  Physical Exam Constitutional:      General: She is not in acute distress. HENT:     Head: Normocephalic and atraumatic.  Eyes:     Conjunctiva/sclera: Conjunctivae normal.     Pupils: Pupils are equal, round, and reactive to light.  Cardiovascular:     Rate and Rhythm: Normal rate and regular rhythm.  Pulmonary:     Effort: Pulmonary effort is normal. No respiratory distress.  Abdominal:     General: There is no distension.     Tenderness: There is no abdominal tenderness.   Skin:    General: Skin is warm and dry.  Neurological:     General: No focal deficit present.     Mental Status: She is alert. Mental status is at baseline.  Psychiatric:        Mood and Affect: Mood normal.        Behavior: Behavior normal.     ED Results / Procedures / Treatments   Labs (all labs ordered are listed, but only abnormal results are displayed) Labs Reviewed  URINALYSIS, ROUTINE W REFLEX MICROSCOPIC - Abnormal; Notable for the following components:      Result Value   Ketones, ur 5 (*)    Leukocytes,Ua TRACE (*)    All other components within normal limits  BASIC METABOLIC PANEL - Abnormal; Notable for the following components:   Sodium 120 (*)    Chloride 87 (*)    Glucose, Bld 117 (*)    All other components within normal limits  URINE CULTURE  SARS CORONAVIRUS 2 BY RT PCR  CBC  NA AND K (SODIUM & POTASSIUM), RAND UR  CREATININE, URINE, RANDOM  OSMOLALITY  TSH  T4, FREE    EKG None  Radiology No results found.  Procedures Procedures    Medications Ordered in ED Medications  acetaminophen (TYLENOL) tablet 650 mg (has no administration in time range)  sodium chloride 0.9 % bolus 1,000 mL (has no administration in time range)  gabapentin (NEURONTIN) capsule 300 mg (has no administration in time range)  fentaNYL (SUBLIMAZE) injection 25 mcg (has no administration in time range)  fosfomycin (MONUROL) packet 3 g (has no administration in time range)    ED Course/ Medical Decision Making/ A&P Clinical Course as of 06/11/23 1702  Sat Jun 11, 2023  1701 Admitted to Dr Imogene Burn [MT]    Clinical Course User Index [MT] Renaye Rakers Kermit Balo, MD                                 Medical Decision Making Amount and/or Complexity of Data Reviewed Labs: ordered.  Risk Prescription drug management.   This patient presents to the ED with concern for entire body paresthesia or pain, also dysuria. This involves an extensive number of treatment options, and  is a complaint that carries with it a high risk of complications and morbidity.  The differential diagnosis includes infection versus medication side effect versus neuropathy versus electrolyte derangement versus other  Co-morbidities that complicate the patient evaluation: History of recurring UTIs and drug-resistant UTIs  External records from outside source obtained and reviewed including ID office eval 03/17/23 with Dr Earlene Plater noting negative urine cultures in the past, although location intermittently is prescribed fosfomycin for UTIs.  I ordered and personally interpreted labs.  The pertinent results include: Sodium 120.  UA with trace leukocytes, negative nitrites, no bacteria.  No leukocytosis.  No acute anemia  The patient was maintained on a cardiac monitor.  I personally viewed and interpreted the cardiac monitored which showed an underlying rhythm of: Rhythm   I ordered medication including fosfomycin for dysuria and potential UTI.  Normal saline bolus for hyponatremia.  Fentanyl and gabapentin for neuropathy or burning type pain  I have reviewed the patients home medicines and have made adjustments as needed  Test Considered: No evidence of pneumonia or intra-abdominal infection to warrant emergent CT imaging of the chest abdomen or pelvis.  After the interventions noted above, I reevaluated the patient and found that they have: stayed the same   Dispostion:  After consideration of the diagnostic results and the patients response to treatment, I feel that the patent would benefit from medical admission for hyponatremia.  Unclear etiology of hyponatremia -she says she has had these issues in the past, because she cannot seem to recall what the cause was.  I do not see that she is on diuretic medication. Last Na was nearly 1 year ago, so this is likely a chronic decline, but the sodium is low enough to warrant medical admission for observation and repeat check.  Her whole body  discomfort and burning sensation is difficult to pinpoint but she seems to feel that this started after taking Lexapro last night.  I wonder about a psychosomatic syndrome.  We can stop the lexapro medication and see if she improves.         Final Clinical Impression(s) / ED Diagnoses Final diagnoses:  Hyponatremia  Neuropathy  Dysuria    Rx / DC Orders ED Discharge Orders     None         Terald Sleeper, MD 06/11/23 1654

## 2023-06-11 NOTE — Assessment & Plan Note (Signed)
Observation med/surg bed. Likely due to poor solute intake over the last several weeks. Serum osm, urine osm are being checked. Urine sodium 38. TSH pending. IV NS ordered in ER. Repeat BMP in 4 hours. Pt is euvolemic.

## 2023-06-11 NOTE — Assessment & Plan Note (Addendum)
Patient states that she has burning over her entire body after taking 1 dose of Lexapro.  Patient is seeing behavioral health as an outpatient.  Will stop her Lexapro for now.  Patient with a very flat affect.  Seems very anxious.  Continue 3 times daily Xanax.  Continue BuSpar twice daily.

## 2023-06-11 NOTE — ED Notes (Signed)
ED TO INPATIENT HANDOFF REPORT  ED Nurse Name and Phone #: Richarda Osmond Name/Age/Gender Debbie Wells 82 y.o. female Room/Bed: WA18/WA18  Code Status   Code Status: Full Code  Home/SNF/Other Home Patient oriented to: self, place, time, and situation Is this baseline? Yes   Triage Complete: Triage complete  Chief Complaint Hyponatremia [E87.1]  Triage Note Pt BIBA from [facility]. Pt c/o sensation on burning in whole body. Poss of UTI.  AOX4   Allergies Allergies  Allergen Reactions   Hydrocodone-Acetaminophen Rash    Vicodin=REACTION: rash   Iodinated Contrast Media Hives and Rash    Per patient she has developed hives and itching in upper trunk of body after last 2 CT scans once she got homes that she has just treated at home with benadryl. She has not told anyone until now. States she was able to bear it  Other reaction(s): Rash Per patient she has developed hives and itching in upper trunk of body after last 2 CT scans once she got homes that she has just treated at home with benadryl. She has not told anyone until now. States she was able to bear it    Sulfa Antibiotics Rash    REACTION: rash Other reaction(s): Unknown   Codeine     Other reaction(s): Unknown   Hydrocodone-Acetaminophen     Vicodin=REACTION: rash   Sulfonamide Derivatives     REACTION: rash   Terbinafine     Other reaction(s): Unknown   Benazepril Cough    Other reaction(s): Cough    Level of Care/Admitting Diagnosis ED Disposition     ED Disposition  Admit   Condition  --   Comment  Hospital [facility]: [facility] [[id number]]  Level of Care: Med-Surg [16]  May place patient in observation at [facility] or [facility] if equivalent level of care is available:: No  Covid Evaluation: Asymptomatic - no recent exposure (last 10 days) testing not required  Diagnosis: Hyponatremia [161096]  Admitting Physician: Imogene Burn, ERIC [[id number]]  Attending Physician: Imogene Burn, ERIC  [[id number]]          B Medical/Surgery History Past Medical History:  Diagnosis Date   Adenomatous colon polyp    Adenomatous polyp of colon 12/03/2004   6mm   Anxiety    Chronic headaches    Chronic insomnia    Depression    Diverticulosis    External hemorrhoids    Hyperlipidemia    Hypertension    Hyperthyroidism    Hypothyroidism    Umbilical hernia    Past Surgical History:  Procedure Laterality Date   ABDOMINAL HYSTERECTOMY  1975   APPENDECTOMY  1975   COLONOSCOPY  03/03/2010,12/03/2004   ESOPHAGOGASTRODUODENOSCOPY  12/16/11   Dr. Stan Head   OOPHORECTOMY     spinal lipoma removed  2006   TOTAL ABDOMINAL HYSTERECTOMY       A IV Location/Drains/Wounds Patient Lines/Drains/Airways Status     Active Line/Drains/Airways     Name Placement date Placement time Site Days   Peripheral IV 06/11/23 Anterior;Distal;Right Forearm 06/11/23  1700  Forearm  less than 1            Intake/Output Last 24 hours No intake or output data in the 24 hours ending 06/11/23 1817  Labs/Imaging Results for orders placed or performed during the hospital encounter of 06/11/23 (from the past 48 hour(s))  Urinalysis, Routine w reflex microscopic -Urine, Clean Catch     Status: Abnormal   Collection Time: 06/11/23  3:27 PM  Result Value Ref Range   Color, Urine YELLOW YELLOW   APPearance CLEAR CLEAR   Specific Gravity, Urine 1.005 1.005 - 1.030   pH 7.0 5.0 - 8.0   Glucose, UA NEGATIVE NEGATIVE mg/dL   Hgb urine dipstick NEGATIVE NEGATIVE   Bilirubin Urine NEGATIVE NEGATIVE   Ketones, ur 5 (A) NEGATIVE mg/dL   Protein, ur NEGATIVE NEGATIVE mg/dL   Nitrite NEGATIVE NEGATIVE   Leukocytes,Ua TRACE (A) NEGATIVE   RBC / HPF 0-5 0 - 5 RBC/hpf   WBC, UA 0-5 0 - 5 WBC/hpf   Bacteria, UA NONE SEEN NONE SEEN   Squamous Epithelial / HPF 0-5 0 - 5 /HPF    Comment: Performed at [facility], [address] [place], [place] [address]  Basic metabolic panel     Status:  Abnormal   Collection Time: 06/11/23  3:27 PM  Result Value Ref Range   Sodium 120 (L) 135 - 145 mmol/L   Potassium 4.4 3.5 - 5.1 mmol/L   Chloride 87 (L) 98 - 111 mmol/L   CO2 23 22 - 32 mmol/L   Glucose, Bld 117 (H) 70 - 99 mg/dL    Comment: Glucose reference range applies only to samples taken after fasting for at least 8 hours.   BUN 8 8 - 23 mg/dL   Creatinine, Ser 6.01 0.44 - 1.00 mg/dL   Calcium 9.6 8.9 - 09.3 mg/dL   GFR, Estimated >23 >55 mL/min    Comment: (NOTE) Calculated using the CKD-EPI Creatinine Equation (2021)    Anion gap 10 5 - 15    Comment: Performed at [facility], [address], [place], [place] [address]  CBC     Status: None   Collection Time: 06/11/23  3:27 PM  Result Value Ref Range   WBC 5.8 4.0 - 10.5 K/uL   RBC 4.74 3.87 - 5.11 MIL/uL   Hemoglobin 14.5 12.0 - 15.0 g/dL   HCT 25.4 27.0 - 62.3 %   MCV 89.0 80.0 - 100.0 fL   MCH 30.6 26.0 - 34.0 pg   MCHC 34.4 30.0 - 36.0 g/dL   RDW 76.2 83.1 - 51.7 %   Platelets 273 150 - 400 K/uL   nRBC 0.0 0.0 - 0.2 %    Comment: Performed at [facility], [address] [place], [place] [address]  Na and K (sodium & potassium), rand urine     Status: None   Collection Time: 06/11/23  3:27 PM  Result Value Ref Range   Sodium, Ur 38 mmol/L   Potassium Urine 23 mmol/L    Comment: Performed at [facility], [address], [place], [place] [address]  Creatinine, urine, random     Status: None   Collection Time: 06/11/23  3:27 PM  Result Value Ref Range   Creatinine, Urine 54 mg/dL    Comment: Performed at [facility], [address] [place], [place] [address]  TSH     Status: None   Collection Time: 06/11/23  4:43 PM  Result Value Ref Range   TSH 1.460 0.350 - 4.500 uIU/mL    Comment: Performed by a 3rd Generation assay with a functional sensitivity of <=0.01 uIU/mL. Performed at [facility], [address], [place], [place] [address]   SARS Coronavirus 2 by RT PCR (hospital order, performed in [facility] hospital lab) *cepheid single result test* Anterior Nasal Swab  Status: None   Collection Time: 06/11/23  5:06 PM   Specimen: Anterior Nasal Swab  Result Value Ref Range   SARS Coronavirus 2 by RT PCR NEGATIVE NEGATIVE    Comment: (NOTE) SARS-CoV-2 target nucleic acids are NOT DETECTED.  The SARS-CoV-2 RNA is generally detectable in upper and lower respiratory specimens during the acute phase of infection. The lowest concentration of SARS-CoV-2 viral copies this assay can detect is 250 copies / mL. A negative result does not preclude SARS-CoV-2 infection and should not be used as the sole basis for treatment or other patient management decisions.  A negative result may occur with improper specimen collection / handling, submission of specimen other than nasopharyngeal swab, presence of viral mutation(s) within the areas targeted by this assay, and inadequate number of viral copies (<250 copies / mL). A negative result must be combined with clinical observations, patient history, and epidemiological information.  Fact Sheet for Patients:   RoadLapTop.co.za  Fact Sheet for Healthcare Providers: http://kim-miller.com/  This test is not yet approved or  cleared by the [place] FDA and has been authorized for detection and/or diagnosis of SARS-CoV-2 by FDA under an Emergency Use Authorization (EUA).  This EUA will remain in effect (meaning this test can be used) for the duration of the COVID-19 declaration under Section 564(b)(1) of the Act, 21 U.S.C. section 360bbb-3(b)(1), unless the authorization is terminated or revoked sooner.  Performed at [facility], [address] [place], [place] [address]    No results found.  Pending Labs Unresulted Labs (From admission, onward)     Start     Ordered   06/11/23  2100  Basic metabolic panel  Once-Timed,   TIMED        06/11/23 1733   06/11/23 1650  Urine Culture  Add-on,   AD       Question:  Indication  Answer:  Dysuria   06/11/23 1649   06/11/23 1639  T4, free  Once,   URGENT        06/11/23 1638   06/11/23 1626  Osmolality  Once,   URGENT        06/11/23 1625   Signed and Held  Comprehensive metabolic panel  Tomorrow morning,   R        Signed and Held   Signed and Held  Magnesium  Tomorrow morning,   R        Signed and Held            Vitals/Pain Today's Vitals   06/11/23 1330 06/11/23 1645 06/11/23 1700 06/11/23 1730  BP:  (!) 250/102 (!) 206/84 (!) 190/89  Pulse:  69 83 72  Resp:  20 20   Temp:      TempSrc:      SpO2:  98% 97% 96%  Weight: 68 kg     Height: 5\' 4"  (1.626 m)     PainSc: 8        Isolation Precautions No active isolations  Medications Medications  acetaminophen (TYLENOL) tablet 650 mg (650 mg Oral Not Given 06/11/23 1700)  losartan (COZAAR) tablet 50 mg (has no administration in time range)  ALPRAZolam (XANAX) tablet 0.5 mg (has no administration in time range)  sodium chloride 0.9 % bolus 1,000 mL (1,000 mLs Intravenous New Bag/Given 06/11/23 1700)  gabapentin (NEURONTIN) capsule 300 mg (300 mg Oral Given 06/11/23 1701)  fosfomycin (MONUROL) packet 3 g (3 g Oral Given 06/11/23 1701)    Mobility walks with  person assist     Focused Assessments    R Recommendations: See Admitting Provider Note  Report given to:   Additional Notes:

## 2023-06-11 NOTE — Assessment & Plan Note (Signed)
BP elevated in the ER.  Patient missed her evening dose of losartan.  Will give that to her now.  Continue on her Norvasc, losartan and Norvasc.

## 2023-06-11 NOTE — Assessment & Plan Note (Signed)
Appears to be chronic. Pt showing symptoms of hypochondriasis.  Continue as needed Xanax.

## 2023-06-11 NOTE — Subjective & Objective (Signed)
CC: burning over her whole body HPI: 81 year old female history of major depression, anxiety, hypertension, chronic insomnia, recurrent UTIs, presents to the ER today with complaints of sensation of h patient took 1 dose of Lexapro last night and woke up this morning with burning sensation on her skin.  No rashes.  No fever.  Patient's does state she has not felt well for several weeks.  Solid food intake has been poor.  She is still drinking water.  She has been on multiple antidepressants in the past.  She refused to take Cymbalta due to weight gain.  She has not had chemistries in about a year.  Patient is hyperfocused on her urinary symptoms.  She states that she has burning with urination.  She "knows" she has another UTI.  This is despite being on prophylactic Keflex 250 mg daily.  She is followed by Dr. Marlou Porch with [facility].  7120, potassium 4.4, chloride of 87, BUN of 8, creatinine 0.89, glucose 117  UA shows specific gravity 1.005, ketones of 5, trace leukocyte Estrace.  Negative nitrites.  Negative bacteria.  No white cells.  White count 5.8, hemoglobin 14.5, platelets of 273  Urine sodium of 38, potassium 23  Due to the patient's sodium of 120, [facility] hospitalist contacted for admission.

## 2023-06-12 DIAGNOSIS — Z8249 Family history of ischemic heart disease and other diseases of the circulatory system: Secondary | ICD-10-CM | POA: Diagnosis not present

## 2023-06-12 DIAGNOSIS — E785 Hyperlipidemia, unspecified: Secondary | ICD-10-CM | POA: Diagnosis present

## 2023-06-12 DIAGNOSIS — I10 Essential (primary) hypertension: Secondary | ICD-10-CM

## 2023-06-12 DIAGNOSIS — E871 Hypo-osmolality and hyponatremia: Secondary | ICD-10-CM

## 2023-06-12 DIAGNOSIS — E8809 Other disorders of plasma-protein metabolism, not elsewhere classified: Secondary | ICD-10-CM | POA: Diagnosis present

## 2023-06-12 DIAGNOSIS — Z1152 Encounter for screening for COVID-19: Secondary | ICD-10-CM | POA: Diagnosis not present

## 2023-06-12 DIAGNOSIS — F5104 Psychophysiologic insomnia: Secondary | ICD-10-CM

## 2023-06-12 DIAGNOSIS — N179 Acute kidney failure, unspecified: Secondary | ICD-10-CM | POA: Diagnosis present

## 2023-06-12 DIAGNOSIS — Z885 Allergy status to narcotic agent status: Secondary | ICD-10-CM | POA: Diagnosis not present

## 2023-06-12 DIAGNOSIS — Z800 Family history of malignant neoplasm of digestive organs: Secondary | ICD-10-CM | POA: Diagnosis not present

## 2023-06-12 DIAGNOSIS — Z79899 Other long term (current) drug therapy: Secondary | ICD-10-CM | POA: Diagnosis not present

## 2023-06-12 DIAGNOSIS — Z888 Allergy status to other drugs, medicaments and biological substances status: Secondary | ICD-10-CM | POA: Diagnosis not present

## 2023-06-12 DIAGNOSIS — Z79890 Hormone replacement therapy (postmenopausal): Secondary | ICD-10-CM | POA: Diagnosis not present

## 2023-06-12 DIAGNOSIS — Z882 Allergy status to sulfonamides status: Secondary | ICD-10-CM | POA: Diagnosis not present

## 2023-06-12 DIAGNOSIS — E039 Hypothyroidism, unspecified: Secondary | ICD-10-CM | POA: Diagnosis present

## 2023-06-12 DIAGNOSIS — F331 Major depressive disorder, recurrent, moderate: Secondary | ICD-10-CM

## 2023-06-12 DIAGNOSIS — R4589 Other symptoms and signs involving emotional state: Secondary | ICD-10-CM

## 2023-06-12 DIAGNOSIS — Z823 Family history of stroke: Secondary | ICD-10-CM | POA: Diagnosis not present

## 2023-06-12 DIAGNOSIS — Z91041 Radiographic dye allergy status: Secondary | ICD-10-CM | POA: Diagnosis not present

## 2023-06-12 DIAGNOSIS — E861 Hypovolemia: Secondary | ICD-10-CM | POA: Diagnosis present

## 2023-06-12 DIAGNOSIS — R300 Dysuria: Secondary | ICD-10-CM | POA: Diagnosis present

## 2023-06-12 DIAGNOSIS — F419 Anxiety disorder, unspecified: Secondary | ICD-10-CM | POA: Diagnosis present

## 2023-06-12 DIAGNOSIS — Z87440 Personal history of urinary (tract) infections: Secondary | ICD-10-CM

## 2023-06-12 DIAGNOSIS — Z87891 Personal history of nicotine dependence: Secondary | ICD-10-CM | POA: Diagnosis not present

## 2023-06-12 DIAGNOSIS — Z841 Family history of disorders of kidney and ureter: Secondary | ICD-10-CM | POA: Diagnosis not present

## 2023-06-12 DIAGNOSIS — D649 Anemia, unspecified: Secondary | ICD-10-CM | POA: Diagnosis present

## 2023-06-12 DIAGNOSIS — E872 Acidosis, unspecified: Secondary | ICD-10-CM | POA: Diagnosis present

## 2023-06-12 LAB — COMPREHENSIVE METABOLIC PANEL
ALT: 20 U/L (ref 0–44)
AST: 24 U/L (ref 15–41)
Albumin: 3.7 g/dL (ref 3.5–5.0)
Alkaline Phosphatase: 64 U/L (ref 38–126)
Anion gap: 8 (ref 5–15)
BUN: 11 mg/dL (ref 8–23)
CO2: 22 mmol/L (ref 22–32)
Calcium: 8.8 mg/dL — ABNORMAL LOW (ref 8.9–10.3)
Chloride: 94 mmol/L — ABNORMAL LOW (ref 98–111)
Creatinine, Ser: 0.8 mg/dL (ref 0.44–1.00)
GFR, Estimated: >60 mL/min (ref >60)
Glucose, Bld: 77 mg/dL (ref 70–99)
Potassium: 3.6 mmol/L (ref 3.5–5.1)
Sodium: 124 mmol/L — ABNORMAL LOW (ref 135–145)
Total Bilirubin: 0.6 mg/dL (ref 0.3–1.2)
Total Protein: 6.4 g/dL — ABNORMAL LOW (ref 6.5–8.1)

## 2023-06-12 LAB — CBC WITH DIFFERENTIAL/PLATELET
Abs Immature Granulocytes: 0.01 10*3/uL (ref 0.00–0.07)
Basophils Absolute: 0.1 10*3/uL (ref 0.0–0.1)
Basophils Relative: 1 %
Eosinophils Absolute: 0.2 10*3/uL (ref 0.0–0.5)
Eosinophils Relative: 4 %
HCT: 34.8 % — ABNORMAL LOW (ref 36.0–46.0)
Hemoglobin: 11.5 g/dL — ABNORMAL LOW (ref 12.0–15.0)
Immature Granulocytes: 0 %
Lymphocytes Relative: 34 %
Lymphs Abs: 2.1 10*3/uL (ref 0.7–4.0)
MCH: 30.1 pg (ref 26.0–34.0)
MCHC: 33 g/dL (ref 30.0–36.0)
MCV: 91.1 fL (ref 80.0–100.0)
Monocytes Absolute: 0.7 10*3/uL (ref 0.1–1.0)
Monocytes Relative: 11 %
Neutro Abs: 3.2 10*3/uL (ref 1.7–7.7)
Neutrophils Relative %: 50 %
Platelets: 242 10*3/uL (ref 150–400)
RBC: 3.82 MIL/uL — ABNORMAL LOW (ref 3.87–5.11)
RDW: 12.5 % (ref 11.5–15.5)
WBC: 6.2 10*3/uL (ref 4.0–10.5)
nRBC: 0 % (ref 0.0–0.2)

## 2023-06-12 LAB — BASIC METABOLIC PANEL
Anion gap: 9 (ref 5–15)
BUN: 8 mg/dL (ref 8–23)
CO2: 21 mmol/L — ABNORMAL LOW (ref 22–32)
Calcium: 8.9 mg/dL (ref 8.9–10.3)
Chloride: 96 mmol/L — ABNORMAL LOW (ref 98–111)
Creatinine, Ser: 0.76 mg/dL (ref 0.44–1.00)
GFR, Estimated: >60 mL/min (ref >60)
Glucose, Bld: 105 mg/dL — ABNORMAL HIGH (ref 70–99)
Potassium: 4 mmol/L (ref 3.5–5.1)
Sodium: 126 mmol/L — ABNORMAL LOW (ref 135–145)

## 2023-06-12 LAB — URINE CULTURE: Culture: NO GROWTH

## 2023-06-12 LAB — MAGNESIUM: Magnesium: 2.2 mg/dL (ref 1.7–2.4)

## 2023-06-12 MED ORDER — TRAZODONE HCL 100 MG PO TABS
100.0000 mg | ORAL_TABLET | Freq: Every day | ORAL | Status: DC
  Administered 2023-06-12: 100 mg via ORAL
  Administered 2023-06-13 – 2023-06-14 (×2): 50 mg via ORAL
  Filled 2023-06-12 (×3): qty 1

## 2023-06-12 MED ORDER — GABAPENTIN 300 MG PO CAPS
300.0000 mg | ORAL_CAPSULE | Freq: Two times a day (BID) | ORAL | Status: DC
  Administered 2023-06-12 – 2023-06-15 (×6): 300 mg via ORAL
  Filled 2023-06-12 (×6): qty 1

## 2023-06-12 MED ORDER — SODIUM CHLORIDE 0.9 % IV SOLN: INTRAVENOUS | Status: DC

## 2023-06-12 MED ORDER — PHENAZOPYRIDINE HCL 200 MG PO TABS
200.0000 mg | ORAL_TABLET | Freq: Once | ORAL | Status: AC
  Administered 2023-06-12: 200 mg via ORAL
  Filled 2023-06-12: qty 1

## 2023-06-12 MED ORDER — PHENAZOPYRIDINE HCL 200 MG PO TABS
200.0000 mg | ORAL_TABLET | Freq: Three times a day (TID) | ORAL | Status: DC
  Administered 2023-06-12 – 2023-06-13 (×4): 200 mg via ORAL
  Filled 2023-06-12 (×5): qty 1

## 2023-06-12 NOTE — Progress Notes (Signed)
PROGRESS NOTE    Debbie Wells  ZOX:[id number] DOB: 03-Apr-1941 DOA: 06/11/2023 PCP: Adrian Prince, MD   Brief Narrative:  The patient is an 82 year old super morbidly obese Caucasian female with past medical history significant for but limited to depression, anxiety, hypertension, chronic insomnia, recurrent UTIs as well as other comorbidities who presents to the ED with complaints of a burning sensation after she took 1 dose of Lexapro and woke up.  She denies any rashes or fevers and states that she has not felt for several weeks.  States that her child food intake has been poor as well but states that she is still drinking some water.  She is on multiple antidepressants and has refused to take Cymbalta due to her weight gain.  She presented to the ED and was hyperfocused on urinary symptoms and complaint of burning with urination.  And stated that she had another UTI.  She is on prophylactic Keflex to 50 mg daily and followed by Dr. Marlou Porch [facility].  Further workup was done and she was admitted and found to have a hyponatremia and is currently getting IV fluid hydration.  Assessment and Plan:   Hyponatremia -Placed in Observation and will change to Inpatient -Serum Osm was 257, Urine K was 24, Urine Na+ was 38 and Urine creatine was 54 -TSH as below; Patient appears euvolemic to a little hypovolemic -Likely 2/2 to poor solute intake  -Patient's Na+ Trend: Recent Labs  Lab 06/11/23 1527 06/11/23 2119 06/12/23 0227  NA 120* 123* 124*  -Resume IVF with NS at 75 mL/hr -Repeat Na+ this Afternoon   Moderate recurrent major depression (HCC) -Patient states that she has burning over her entire body after taking 1 dose of Lexapro.   -Patient is seeing behavioral health as an outpatient.   -Will stop her Lexapro for now.  Patient with a very flat affect.  Seems very anxious.   -Continue Alprazolam 0.5 mg po TID and Buspirone 25 mg po BID.   Anxiety About Health -Appears to be  chronic. Pt showing symptoms of hypochondriasis.  -Continue Alprazolam 0.5 mg po TID and Buspirone 25 mg po BID.   History of recurrent UTIs -UA is quite bland and showed a clear appearance with 5 ketones, trace leukocytes, negative nitrites, no bacteria seen, 0-5 WBCs and 0-5 RBCs per high-power field -Pt hyper-focused in her urinary symptoms and is adamant that she has a UTI.  -Pt given 1 dose of fosfomycin in the ER. No further ABX are required and only place her at risk for developing MDRO infections. -Continues to have burning on Urination so will continue Phenazopyridine 200 mg po TID x 2 Days    Chronic insomnia -Patient on multiple medications for insomnia. -Resumed Melatonin 10 mg po qHSprn Sleep, Trazodone 100 mg po at bedtime, and Zolpidem 5 mg po qHS    Essential (primary) hypertension -BP elevated in the ER.  Patient missed her evening dose of losartan. -Resume Antihypertensives with Losartan 50 mg po BID, Metoprolol Succinate 12.5 mg po Daily, and Amlodipine 2.5 mg po Daily along with Clonidine 0.1 mg po q4hprn SBP >170 or DBP >100 -Continue to Monitor BP per Protocol  -Last BP reading was 162/66  Hypothyroidism -TSH was 1.460 and Free T4 was 1.69 -C/w Levothyroxine 100 mcg po Daily   Normocytic Anemia -Hgb/Hct Trend: Recent Labs  Lab 06/11/23 1527 06/12/23 0227  HGB 14.5 11.5*  HCT 42.2 34.8*  MCV 89.0 91.1  -Check Anemia Panel in the AM -  Continue to Monitor for S/Sx of Bleeding; No overt bleeding noted -Repeat CBC in the AM  Super Morbid Obesity -Complicates overall prognosis and care -Estimated body mass index is 56.31 kg/m as calculated from the following:   Height as of this encounter: 5\' 4"  (1.626 m).   Weight as of this encounter: 148.8 kg.  -Weight Loss and Dietary Counseling given   DVT prophylaxis: SCDs Start: 06/11/23 1855    Code Status: Full Code Family Communication: No family currently at bedside  Disposition Plan:  Level of care:  Med-Surg Status is: Inpatient Remains inpatient appropriate because: Needs further clinical improvement   Consultants:  None  Procedures:  As delineated as above  Antimicrobials:  Anti-infectives (From admission, onward)    Start     Dose/Rate Route Frequency Ordered Stop   06/11/23 1700  fosfomycin (MONUROL) packet 3 g        3 g Oral  Once 06/11/23 1649 06/11/23 1701       Subjective: Seen and examined at bedside states that she did not sleep very well last night at all and continues to have some burning.  No nausea or vomiting.  Does not feel as well.  Denies any other concerns or complaint at this time.  Objective: Vitals:   06/12/23 0248 06/12/23 0345 06/12/23 0500 06/12/23 0701  BP:  (!) 160/65  (!) 162/66  Pulse:  62  70  Resp:  16  12  Temp: 97.8 F (36.6 C) (!) 97.5 F (36.4 C)  97.8 F (36.6 C)  TempSrc: Oral   Oral  SpO2:  96%  96%  Weight:   (!) 148.8 kg   Height:       No intake or output data in the 24 hours ending 06/12/23 1650 Filed Weights   06/11/23 1330 06/12/23 0500  Weight: 68 kg (!) 148.8 kg   Examination: Physical Exam:  Constitutional: WN/WD morbidly obese Caucasian female in no acute distress appears calm Respiratory: Diminished to auscultation bilaterally, no wheezing, rales, rhonchi or crackles. Normal respiratory effort and patient is not tachypenic. No accessory muscle use.  Unlabored breathing Cardiovascular: RRR, no murmurs / rubs / gallops. S1 and S2 auscultated. No extremity edema.  Abdomen: Soft, non-tender, distended secondary to body habitus. Bowel sounds positive.  GU: Deferred. Musculoskeletal: No clubbing / cyanosis of digits/nails. No joint deformity upper and lower extremities. Skin: No rashes, lesions, ulcers on a limited skin evaluation. No induration; Warm and dry.  Neurologic: CN 2-12 grossly intact with no focal deficits. Romberg sign and cerebellar reflexes not assessed.  Psychiatric: Normal judgment and insight.   Depressed appearing slightly anxious  Data Reviewed: I have personally reviewed following labs and imaging studies  CBC: Recent Labs  Lab 06/11/23 1527 06/12/23 0227  WBC 5.8 6.2  NEUTROABS  --  3.2  HGB 14.5 11.5*  HCT 42.2 34.8*  MCV 89.0 91.1  PLT 273 242   Basic Metabolic Panel: Recent Labs  Lab 06/11/23 1527 06/11/23 2119 06/12/23 0227  NA 120* 123* 124*  K 4.4 3.7 3.6  CL 87* 92* 94*  CO2 23 23 22   GLUCOSE 117* 158* 77  BUN 8 9 11   CREATININE 0.89 0.86 0.80  CALCIUM 9.6 8.9 8.8*  MG  --   --  2.2   GFR: Estimated Creatinine Clearance: 80.4 mL/min (by C-G formula based on SCr of 0.8 mg/dL). Liver Function Tests: Recent Labs  Lab 06/12/23 0227  AST 24  ALT 20  ALKPHOS 64  BILITOT  0.6  PROT 6.4*  ALBUMIN 3.7   No results for input(s): "LIPASE", "AMYLASE" in the last 168 hours. No results for input(s): "AMMONIA" in the last 168 hours. Coagulation Profile: No results for input(s): "INR", "PROTIME" in the last 168 hours. Cardiac Enzymes: No results for input(s): "CKTOTAL", "CKMB", "CKMBINDEX", "TROPONINI" in the last 168 hours. BNP (last 3 results) No results for input(s): "PROBNP" in the last 8760 hours. HbA1C: No results for input(s): "HGBA1C" in the last 72 hours. CBG: No results for input(s): "GLUCAP" in the last 168 hours. Lipid Profile: No results for input(s): "CHOL", "HDL", "LDLCALC", "TRIG", "CHOLHDL", "LDLDIRECT" in the last 72 hours. Thyroid Function Tests: Recent Labs    06/11/23 1643  TSH 1.460  FREET4 1.69*   Anemia Panel: No results for input(s): "VITAMINB12", "FOLATE", "FERRITIN", "TIBC", "IRON", "RETICCTPCT" in the last 72 hours. Sepsis Labs: No results for input(s): "PROCALCITON", "LATICACIDVEN" in the last 168 hours.  Recent Results (from the past 240 hour(s))  Urine Culture     Status: None   Collection Time: 06/11/23  3:27 PM   Specimen: Urine, Clean Catch  Result Value Ref Range Status   Specimen Description   Final     URINE, CLEAN CATCH Performed at [facility], [address] [place], [place] [address]    Special Requests   Final    NONE Performed at [facility], [address], [place], [place] [address]    Culture   Final    NO GROWTH Performed at [facility], [address] [place], [place] [address]    Report Status 06/12/2023 FINAL  Final  SARS Coronavirus 2 by RT PCR (hospital order, performed in [facility] hospital lab) *cepheid single result test* Anterior Nasal Swab     Status: None   Collection Time: 06/11/23  5:06 PM   Specimen: Anterior Nasal Swab  Result Value Ref Range Status   SARS Coronavirus 2 by RT PCR NEGATIVE NEGATIVE Final    Comment: (NOTE) SARS-CoV-2 target nucleic acids are NOT DETECTED.  The SARS-CoV-2 RNA is generally detectable in upper and lower respiratory specimens during the acute phase of infection. The lowest concentration of SARS-CoV-2 viral copies this assay can detect is 250 copies / mL. A negative result does not preclude SARS-CoV-2 infection and should not be used as the sole basis for treatment or other patient management decisions.  A negative result may occur with improper specimen collection / handling, submission of specimen other than nasopharyngeal swab, presence of viral mutation(s) within the areas targeted by this assay, and inadequate number of viral copies (<250 copies / mL). A negative result must be combined with clinical observations, patient history, and epidemiological information.  Fact Sheet for Patients:   RoadLapTop.co.za  Fact Sheet for Healthcare Providers: http://kim-miller.com/  This test is not yet approved or  cleared by the [place] FDA and has been authorized for detection and/or diagnosis of SARS-CoV-2 by FDA under an Emergency Use Authorization (EUA).  This EUA will remain in effect (meaning this test can be used) for the  duration of the COVID-19 declaration under Section 564(b)(1) of the Act, 21 U.S.C. section 360bbb-3(b)(1), unless the authorization is terminated or revoked sooner.  Performed at [facility], [address] [place], [place] [address]     Radiology Studies: No results found.  Scheduled Meds:  acetaminophen  650 mg Oral Once   ALPRAZolam  0.5 mg Oral TID   amLODipine  2.5 mg  Oral Daily   busPIRone  15 mg Oral BID   feeding supplement  1 Container Oral TID BM   gabapentin  300 mg Oral BID   levothyroxine  100 mcg Oral Q0600   [START ON 06/13/2023] losartan  50 mg Oral BID   metoprolol succinate  12.5 mg Oral Daily   phenazopyridine  200 mg Oral TID WC   senna-docusate  2 tablet Oral QHS   traZODone  100 mg Oral QHS   zolpidem  5 mg Oral QHS   Continuous Infusions:  sodium chloride 75 mL/hr at 06/12/23 1242    LOS: 0 days   Marguerita Merles, DO [facility] Available via Epic secure chat 7am-7pm After these hours, please refer to coverage provider listed on [email or web address] 06/12/2023, 4:50 PM

## 2023-06-12 NOTE — Hospital Course (Addendum)
Debbie Wells is a 82 y.o. female with a history of depression, anxiety, hypertension, insomnia, recurrent UTIs.  Patient presented secondary to burning over her whole body after taking Lexapro and was found to have evidence of hyponatremia. Patient started on IV fluids with improvement of sodium. Hospitalization complicated by urinary retention, which resolved, in addition to AKI, which has resolved as well.

## 2023-06-12 NOTE — TOC CM/SW Note (Signed)
Transition of Care [facility]) - Inpatient Brief Assessment   Patient Details  Name: Debbie Wells MRN: [id number] Date of Birth: September 27, 1941  [facility] CM/SW Contact:    Otelia Santee, LCSW Phone Number: 06/12/2023, 1:20 PM   Clinical Narrative: Pt currently resides in ILF at [facility] and plans to return at discharge.    Transition of Care Asessment: Insurance and Status: Insurance coverage has been reviewed Patient has primary care physician: Yes Home environment has been reviewed: ILF at [facility] Prior level of function:: Independent/modified independent Prior/Current Home Services: No current home services Social Determinants of Health Reivew: SDOH reviewed no interventions necessary Readmission risk has been reviewed: Yes Transition of care needs: no transition of care needs at this time

## 2023-06-12 NOTE — Plan of Care (Signed)
Patient awake, alert orientx4. Remains on RA. Very anxious noted with poor appetite- nutritional supplement given. Ongoing urinary frequency. Safety precautions maintained.  Problem: Education: Goal: Knowledge of General Education information will improve Description: Including pain rating scale, medication(s)/side effects and non-pharmacologic comfort measures Outcome: Progressing   Problem: Health Behavior/Discharge Planning: Goal: Ability to manage health-related needs will improve Outcome: Progressing   Problem: Clinical Measurements: Goal: Ability to maintain clinical measurements within normal limits will improve Outcome: Progressing Goal: Will remain free from infection Outcome: Progressing Goal: Diagnostic test results will improve Outcome: Progressing Goal: Respiratory complications will improve Outcome: Progressing Goal: Cardiovascular complication will be avoided Outcome: Progressing   Problem: Activity: Goal: Risk for activity intolerance will decrease Outcome: Progressing   Problem: Nutrition: Goal: Adequate nutrition will be maintained Outcome: Progressing   Problem: Coping: Goal: Level of anxiety will decrease Outcome: Progressing   Problem: Elimination: Goal: Will not experience complications related to bowel motility Outcome: Progressing Goal: Will not experience complications related to urinary retention Outcome: Progressing   Problem: Pain Managment: Goal: General experience of comfort will improve Outcome: Progressing   Problem: Safety: Goal: Ability to remain free from injury will improve Outcome: Progressing   Problem: Skin Integrity: Goal: Risk for impaired skin integrity will decrease Outcome: Progressing

## 2023-06-13 ENCOUNTER — Inpatient Hospital Stay (HOSPITAL_COMMUNITY): Payer: Medicare PPO

## 2023-06-13 DIAGNOSIS — F5104 Psychophysiologic insomnia: Secondary | ICD-10-CM | POA: Diagnosis not present

## 2023-06-13 DIAGNOSIS — R4589 Other symptoms and signs involving emotional state: Secondary | ICD-10-CM | POA: Diagnosis not present

## 2023-06-13 DIAGNOSIS — F331 Major depressive disorder, recurrent, moderate: Secondary | ICD-10-CM | POA: Diagnosis not present

## 2023-06-13 DIAGNOSIS — E871 Hypo-osmolality and hyponatremia: Secondary | ICD-10-CM | POA: Diagnosis not present

## 2023-06-13 MED ORDER — CHLORHEXIDINE GLUCONATE CLOTH 2 % EX PADS
6.0000 | MEDICATED_PAD | Freq: Every day | CUTANEOUS | Status: DC
  Administered 2023-06-13 – 2023-06-14 (×2): 6 via TOPICAL

## 2023-06-13 NOTE — Progress Notes (Signed)
PROGRESS NOTE    Debbie Wells  UJW:[id number] DOB: Aug 06, 1941 DOA: 06/11/2023 PCP: Adrian Prince, MD   Brief Narrative:  The patient is an 83 year old super morbidly obese Caucasian female with past medical history significant for but limited to depression, anxiety, hypertension, chronic insomnia, recurrent UTIs as well as other comorbidities who presents to the ED with complaints of a burning sensation after she took 1 dose of Lexapro and woke up.  She denies any rashes or fevers and states that she has not felt for several weeks.  States that her child food intake has been poor as well but states that she is still drinking some water.  She is on multiple antidepressants and has refused to take Cymbalta due to her weight gain.  She presented to the ED and was hyperfocused on urinary symptoms and complaint of burning with urination.  And stated that she had another UTI.  She is on prophylactic Keflex to 50 mg daily and followed by Dr. Marlou Porch [facility].  Further workup was done and she was admitted and found to have a hyponatremia and is currently getting IV fluid hydration.  Sodium is slowly trending upwards however she did have some acute urinary retention and had an AKI today so we will continue monitor carefully and repeat blood work in the AM.  Assessment and Plan:   Hyponatremia -Placed in Observation and will change to Inpatient -Serum Osm was 257, Urine K was 24, Urine Na+ was 38 and Urine creatine was 54 -TSH as below; Patient appears euvolemic to a little hypovolemic -Likely 2/2 to poor solute intake  -Patient's Na+ Trend: Recent Labs  Lab 06/11/23 1527 06/11/23 2119 06/12/23 0227  NA 120* 123* 124*  -Resume IVF with NS at 75 mL/hr -Continue to monitor and trend and repeat CMP in the a.m. and sodium level slowly trending up -Will get PT and OT to evaluate but mobility specialist is working with the patient and she ambulated 350 feet.   Moderate recurrent major  depression (HCC) -Patient states that she has burning over her entire body after taking 1 dose of Lexapro.   -Patient is seeing behavioral health as an outpatient.   -Will stop her Lexapro for now.  Patient with a very flat affect.  Seems very anxious.   -Continue Alprazolam 0.5 mg po TID and Buspirone 25 mg po BID.   Anxiety About Health -Appears to be chronic. Pt showing symptoms of hypochondriasis.  -Continue Alprazolam 0.5 mg po TID and Buspirone 25 mg po BID.   History of recurrent UTIs -UA is quite bland and showed a clear appearance with 5 ketones, trace leukocytes, negative nitrites, no bacteria seen, 0-5 WBCs and 0-5 RBCs per high-power field -Pt hyper-focused in her urinary symptoms and is adamant that she has a UTI.  -Pt given 1 dose of fosfomycin in the ER. No further ABX are required and only place her at risk for developing MDRO infections. -Continues to have burning on Urination so will continue Phenazopyridine but now stop given Renal Fxn   Acute urinary retention -Was retaining some of her urine and had 430 after she voided so Foley catheter was placed -Will do a trial of void in the a.m.   Chronic insomnia -Patient on multiple medications for insomnia. -Resumed Melatonin 10 mg po qHSprn Sleep, Trazodone 100 mg po at bedtime, and Zolpidem 5 mg po qHS    Essential (primary) hypertension -BP elevated in the ER.  Patient missed her evening dose of  losartan. -Resume Antihypertensives with Losartan 50 mg po BID, Metoprolol Succinate 12.5 mg po Daily, and Amlodipine 2.5 mg po Daily along with Clonidine 0.1 mg po q4hprn SBP >170 or DBP >100 -Continue to Monitor BP per Protocol  -Last BP reading was 138/56  AKI Metabolic Acidosis -BUN/Cr Trend: Recent Labs  Lab 06/11/23 1527 06/11/23 2119 06/12/23 0227 06/12/23 1812 06/13/23 0403  BUN 8 9 11 8 14   CREATININE 0.89 0.86 0.80 0.76 1.36*  -C/w IVF Hydration as above -Has a slight acidosis with a CO2 of 21, anion gap of  8 and a chloride level of 99 -Check Renal U/S and Bladder Scan; Renal U/S done and showed "Negative for hydronephrosis. Small left renal cyst, no imaging follow-up is recommended." -Placed Foley for above -Avoid Nephrotoxic Medications, Contrast Dyes, Hypotension and Dehydration to Ensure Adequate Renal Perfusion and will need to Renally Adjust Meds -Continue to Monitor and Trend Renal Function carefully and repeat CMP in the AM   Hypothyroidism -TSH was 1.460 and Free T4 was 1.69 -C/w Levothyroxine 100 mcg po Daily   Normocytic Anemia -Hgb/Hct Trend: Recent Labs  Lab 06/11/23 1527 06/12/23 0227 06/13/23 0403  HGB 14.5 11.5* 11.0*  HCT 42.2 34.8* 33.0*  MCV 89.0 91.1 91.9  -Check Anemia Panel and showed an iron level of 91, UIBC 176, TIBC of 267, saturation ratio of 34%, ferritin level 138, folate level 12.4, vitamin B12 level of 1097 -Continue to Monitor for S/Sx of Bleeding; No overt bleeding noted -Repeat CBC in the AM  Hypoalbuminemia -Patient's Albumin Trend: Recent Labs  Lab 06/12/23 0227 06/13/23 0403  ALBUMIN 3.7 3.4*  -Continue to Monitor and Trend and repeat CMP in the AM  Super Morbid Obesity -Complicates overall prognosis and care -Estimated body mass index is 25.81 kg/m as calculated from the following:   Height as of this encounter: 5\' 4"  (1.626 m).   Weight as of this encounter: 68.2 kg.  -Weight Loss and Dietary Counseling given   DVT prophylaxis: SCDs Start: 06/11/23 1855    Code Status: Full Code Family Communication: No family present at bedside  Disposition Plan:  Level of care: Med-Surg Status is: Inpatient Remains inpatient appropriate because: Anticipating discharge in next 24 to 48 hours pending clinical improvement   Consultants:  None  Procedures:  As delineated as above  Antimicrobials:  Anti-infectives (From admission, onward)    Start     Dose/Rate Route Frequency Ordered Stop   06/11/23 1700  fosfomycin (MONUROL) packet 3 g         3 g Oral  Once 06/11/23 1649 06/11/23 1701       Subjective: Seen and examined at bedside I think she is doing okay.  States that she is upset that she ended up having some urinary retention and had the Foley catheter being placed.  No nausea or vomiting.  Thinks he is doing little bit better.  States the burning has ceased.  No other concerns or complaints this time.  Objective: Vitals:   06/12/23 1843 06/13/23 0500 06/13/23 0516 06/13/23 1940  BP: 133/69  125/60 (!) 138/56  Pulse: 82  64 98  Resp: 18  16 16   Temp: 97.8 F (36.6 C)  97.8 F (36.6 C) 97.7 F (36.5 C)  TempSrc: Oral  Oral Oral  SpO2: 96%  94% 94%  Weight:  68.2 kg    Height:        Intake/Output Summary (Last 24 hours) at 06/13/2023 2024 Last data filed at 06/13/2023 1840  Gross per 24 hour  Intake 1873.82 ml  Output 1250 ml  Net 623.82 ml   Filed Weights   06/11/23 1330 06/12/23 0500 06/13/23 0500  Weight: 68 kg (!) 148.8 kg 68.2 kg   Examination: Physical Exam:  Constitutional: WN/WD elderly overweight Caucasian female in no acute distress appears calm Respiratory: Diminished to auscultation bilaterally, no wheezing, rales, rhonchi or crackles. Normal respiratory effort and patient is not tachypenic. No accessory muscle use.  Unlabored breathing Cardiovascular: RRR, no murmurs / rubs / gallops. S1 and S2 auscultated. No extremity edema.  Abdomen: Soft, non-tender, non-distended. Bowel sounds positive.  GU: Deferred.  Foley catheter in place Musculoskeletal: No clubbing / cyanosis of digits/nails. No joint deformity upper and lower extremities.  Skin: No rashes, lesions, ulcers. No induration; Warm and dry.  Neurologic: CN 2-12 grossly intact with no focal deficits.  Romberg sign and cerebellar reflexes not assessed.  Psychiatric: Normal judgment and insight. Alert and oriented x 3.  Anxious appearing mood  Data Reviewed: I have personally reviewed following labs and imaging studies  CBC: Recent  Labs  Lab 06/11/23 1527 06/12/23 0227 06/13/23 0403  WBC 5.8 6.2 6.1  NEUTROABS  --  3.2 3.7  HGB 14.5 11.5* 11.0*  HCT 42.2 34.8* 33.0*  MCV 89.0 91.1 91.9  PLT 273 242 205   Basic Metabolic Panel: Recent Labs  Lab 06/11/23 1527 06/11/23 2119 06/12/23 0227 06/12/23 1812 06/13/23 0403  NA 120* 123* 124* 126* 128*  K 4.4 3.7 3.6 4.0 4.0  CL 87* 92* 94* 96* 99  CO2 23 23 22  21* 21*  GLUCOSE 117* 158* 77 105* 92  BUN 8 9 11 8 14   CREATININE 0.89 0.86 0.80 0.76 1.36*  CALCIUM 9.6 8.9 8.8* 8.9 9.2  MG  --   --  2.2  --  2.3  PHOS  --   --   --   --  3.9   GFR: Estimated Creatinine Clearance: 30.8 mL/min (A) (by C-G formula based on SCr of 1.36 mg/dL (H)). Liver Function Tests: Recent Labs  Lab 06/12/23 0227 06/13/23 0403  AST 24 25  ALT 20 22  ALKPHOS 64 60  BILITOT 0.6 0.6  PROT 6.4* 5.9*  ALBUMIN 3.7 3.4*   No results for input(s): "LIPASE", "AMYLASE" in the last 168 hours. No results for input(s): "AMMONIA" in the last 168 hours. Coagulation Profile: No results for input(s): "INR", "PROTIME" in the last 168 hours. Cardiac Enzymes: No results for input(s): "CKTOTAL", "CKMB", "CKMBINDEX", "TROPONINI" in the last 168 hours. BNP (last 3 results) No results for input(s): "PROBNP" in the last 8760 hours. HbA1C: No results for input(s): "HGBA1C" in the last 72 hours. CBG: No results for input(s): "GLUCAP" in the last 168 hours. Lipid Profile: No results for input(s): "CHOL", "HDL", "LDLCALC", "TRIG", "CHOLHDL", "LDLDIRECT" in the last 72 hours. Thyroid Function Tests: Recent Labs    06/11/23 1643  TSH 1.460  FREET4 1.69*   Anemia Panel: Recent Labs    06/13/23 0403  VITAMINB12 1,097*  FOLATE 12.4  FERRITIN 138  TIBC 267  IRON 91  RETICCTPCT 1.1   Sepsis Labs: No results for input(s): "PROCALCITON", "LATICACIDVEN" in the last 168 hours.  Recent Results (from the past 240 hour(s))  Urine Culture     Status: None   Collection Time: 06/11/23  3:27  PM   Specimen: Urine, Clean Catch  Result Value Ref Range Status   Specimen Description   Final    URINE, CLEAN CATCH Performed  at [facility], [address] [place], [place] [address]    Special Requests   Final    NONE Performed at [facility], [address], [place], [place] [address]    Culture   Final    NO GROWTH Performed at [facility], [address], [place], [place] [address]    Report Status 06/12/2023 FINAL  Final  SARS Coronavirus 2 by RT PCR (hospital order, performed in [facility] hospital lab) *cepheid single result test* Anterior Nasal Swab     Status: None   Collection Time: 06/11/23  5:06 PM   Specimen: Anterior Nasal Swab  Result Value Ref Range Status   SARS Coronavirus 2 by RT PCR NEGATIVE NEGATIVE Final    Comment: (NOTE) SARS-CoV-2 target nucleic acids are NOT DETECTED.  The SARS-CoV-2 RNA is generally detectable in upper and lower respiratory specimens during the acute phase of infection. The lowest concentration of SARS-CoV-2 viral copies this assay can detect is 250 copies / mL. A negative result does not preclude SARS-CoV-2 infection and should not be used as the sole basis for treatment or other patient management decisions.  A negative result may occur with improper specimen collection / handling, submission of specimen other than nasopharyngeal swab, presence of viral mutation(s) within the areas targeted by this assay, and inadequate number of viral copies (<250 copies / mL). A negative result must be combined with clinical observations, patient history, and epidemiological information.  Fact Sheet for Patients:   RoadLapTop.co.za  Fact Sheet for Healthcare Providers: http://kim-miller.com/  This test is not yet approved or  cleared by the [place] FDA and has been authorized for detection and/or diagnosis of SARS-CoV-2 by FDA under an  Emergency Use Authorization (EUA).  This EUA will remain in effect (meaning this test can be used) for the duration of the COVID-19 declaration under Section 564(b)(1) of the Act, 21 U.S.C. section 360bbb-3(b)(1), unless the authorization is terminated or revoked sooner.  Performed at [facility], [address] [place], [place] [address]     Radiology Studies: US RENAL  Result Date: 06/13/2023 CLINICAL DATA:  Acute kidney injury EXAM: RENAL / URINARY TRACT ULTRASOUND COMPLETE COMPARISON:  CT 09/21/2020 FINDINGS: Right Kidney: Renal measurements: 11.4 x 4.1 x 4.4 cm = volume: 108.6 mL. Echogenicity within normal limits. No mass or hydronephrosis visualized. Left Kidney: Renal measurements: 11.9 x 4.8 x 4.9 cm = volume: 145.2 mL. Echogenicity within normal limits. Cyst at the upper pole measuring 13 mm. Bladder: Decompressed by Foley catheter. Other: None. IMPRESSION: Negative for hydronephrosis. Small left renal cyst, no imaging follow-up is recommended. Electronically Signed   By: Jasmine Pang M.D.   On: 06/13/2023 19:45    Scheduled Meds:  acetaminophen  650 mg Oral Once   ALPRAZolam  0.5 mg Oral TID   amLODipine  2.5 mg Oral Daily   busPIRone  15 mg Oral BID   Chlorhexidine Gluconate Cloth  6 each Topical Daily   feeding supplement  1 Container Oral TID BM   gabapentin  300 mg Oral BID   levothyroxine  100 mcg Oral Q0600   losartan  50 mg Oral BID   metoprolol succinate  12.5 mg Oral Daily   senna-docusate  2 tablet Oral QHS   traZODone  100 mg Oral QHS   zolpidem  5 mg Oral QHS   Continuous Infusions:  sodium chloride 75 mL/hr at 06/13/23 1510    LOS: 1 day  Marguerita Merles, DO [facility] Available via Epic secure chat 7am-7pm After these hours, please refer to coverage provider listed on [email or web address] 06/13/2023, 8:24 PM

## 2023-06-13 NOTE — Progress Notes (Signed)
Mobility Specialist - Progress Note   06/13/23 1317  Mobility  Activity Ambulated with assistance in hallway  Level of Assistance Standby assist, set-up cues, supervision of patient - no hands on  Assistive Device None  Distance Ambulated (ft) 350 ft  Range of Motion/Exercises Active  Activity Response Tolerated well  Mobility Referral Yes  $Mobility charge 1 Mobility  Mobility Specialist Start Time (ACUTE ONLY) 1305  Mobility Specialist Stop Time (ACUTE ONLY) 1318  Mobility Specialist Time Calculation (min) (ACUTE ONLY) 13 min   Pt received in bed and agreed to mobility. Had no issues throughout session, returned to bed with all needs met.   Marilynne Halsted Mobility Specialist

## 2023-06-13 NOTE — Plan of Care (Signed)

## 2023-06-14 ENCOUNTER — Inpatient Hospital Stay (HOSPITAL_COMMUNITY): Payer: Medicare PPO

## 2023-06-14 DIAGNOSIS — F331 Major depressive disorder, recurrent, moderate: Secondary | ICD-10-CM | POA: Diagnosis not present

## 2023-06-14 DIAGNOSIS — F5104 Psychophysiologic insomnia: Secondary | ICD-10-CM | POA: Diagnosis not present

## 2023-06-14 DIAGNOSIS — E871 Hypo-osmolality and hyponatremia: Secondary | ICD-10-CM | POA: Diagnosis not present

## 2023-06-14 DIAGNOSIS — R4589 Other symptoms and signs involving emotional state: Secondary | ICD-10-CM | POA: Diagnosis not present

## 2023-06-14 MED ORDER — ORAL CARE MOUTH RINSE: 15.0000 mL | OROMUCOSAL | Status: DC | PRN

## 2023-06-14 MED ORDER — DOXAZOSIN MESYLATE 2 MG PO TABS
2.0000 mg | ORAL_TABLET | Freq: Every day | ORAL | Status: DC
  Administered 2023-06-14 – 2023-06-15 (×2): 2 mg via ORAL
  Filled 2023-06-14 (×2): qty 1

## 2023-06-14 NOTE — Progress Notes (Signed)
PROGRESS NOTE    Debbie Wells  UJW:[id number] DOB: 01/12/1941 DOA: 06/11/2023 PCP: Adrian Prince, MD   Brief Narrative:  The patient is an 82 year old super morbidly obese Caucasian female with past medical history significant for but limited to depression, anxiety, hypertension, chronic insomnia, recurrent UTIs as well as other comorbidities who presents to the ED with complaints of a burning sensation after she took 1 dose of Lexapro and woke up.  She denies any rashes or fevers and states that she has not felt for several weeks.  States that her child food intake has been poor as well but states that she is still drinking some water.  She is on multiple antidepressants and has refused to take Cymbalta due to her weight gain.  She presented to the ED and was hyperfocused on urinary symptoms and complaint of burning with urination.  And stated that she had another UTI.  She is on prophylactic Keflex to 50 mg daily and followed by Dr. Marlou [facility]ch [facility].  Further workup was done and she was admitted and found to have a hyponatremia and is currently getting IV fluid hydration.  Sodium is slowly trending upwards however she did have some acute urinary retention and had an AKI so we will continue monitor carefully and repeat blood work in the AM. AKI is improved but Hepatic Fxn slightly worse so will need further workup and she continues to have Retention so may end up having foley cath placed again.   Assessment and Plan:   Hyponatremia -Placed in Observation and will change to Inpatient -Serum Osm was 257, Urine K was 24, Urine Na+ was 38 and Urine creatine was 54 -TSH as below; Patient appears euvolemic to a little hypovolemic -Likely 2/2 to poor solute intake  -Patient's Na+ Trend: Recent Labs  Lab 06/11/23 1527 06/11/23 2119 06/12/23 0227 06/12/23 1812 06/13/23 0403 06/14/23 0357  NA 120* 123* 124* 126* 128* 131*  -C/w IVF with NS at 75 mL/hr -Continue to monitor and trend  and repeat CMP in the a.m. and sodium level slowly trending up -Will get PT and OT to evaluate but mobility specialist is working with the patient and she ambulated 350 feet. Anticipate D/C in the Next 24 hours   Moderate Recurrent Major Depression (HCC) -Patient states that she has burning over her entire body after taking 1 dose of Lexapro.   -Patient is seeing behavioral health as an outpatient.   -Will stop her Lexapro for now.  Patient with a very flat affect.  Seems very anxious.   -Continue Alprazolam 0.5 mg po TID and Buspirone 25 mg po BID.   Anxiety About Health -Appears to be chronic. Pt showing symptoms of hypochondriasis.  -Continue Alprazolam 0.5 mg po TID and Buspirone 25 mg po BID.   History of recurrent UTIs -UA is quite bland and showed a clear appearance with 5 ketones, trace leukocytes, negative nitrites, no bacteria seen, 0-5 WBCs and 0-5 RBCs per high-power field -Pt hyper-focused in her urinary symptoms and is adamant that she has a UTI.  -Pt given 1 dose of fosfomycin in the ER. No further ABX are required and only place her at risk for developing MDRO infections. -Continues to have burning on Urination so will continue Phenazopyridine but now stop given Renal Fxn   Acute Urinary Retention -Was retaining some of her urine and had 430 after she voided so Foley catheter was placed -Trial of void done and patient continues to still retain.  Will  do 3 in and outs and then replace Foley catheter if she continues to retain throughout the day -Will initiate Doxazosin 2 mg po Daily; Wanted to initiate Tamsulosin but she has a Sulfa Allergy   Abnormal LFTs -Mild and Likely Reactive Recent Labs  Lab 06/12/23 0227 06/13/23 0403 06/14/23 0357  AST 24 25 75*  ALT 20 22 50*  BILITOT 0.6 0.6 0.7  ALKPHOS 64 60 67  -Check RUQ U/S and Acute Hepatitis Panel -Continue to Monitor and Trend and Repeat CMP in the AM   Chronic Insomnia -Patient on multiple medications for  insomnia. -Resumed Melatonin 10 mg po qHSprn Sleep, Trazodone 100 mg po at bedtime, and Zolpidem 5 mg po qHS    Essential (primary) Hypertension -BP elevated in the ER.  Patient missed her evening dose of losartan. -Resume Antihypertensives with Losartan 50 mg po BID, Metoprolol Succinate 12.5 mg po Daily, and Amlodipine 2.5 mg po Daily along with Clonidine 0.1 mg po q4hprn SBP >170 or DBP >100 -Continue to Monitor BP per Protocol  -Last BP reading was 154/72  AKI, improved  Metabolic Acidosis -BUN/Cr Trend: Recent Labs  Lab 06/11/23 1527 06/11/23 2119 06/12/23 0227 06/12/23 1812 06/13/23 0403 06/14/23 0357  BUN 8 9 11 8 14 12   CREATININE 0.89 0.86 0.80 0.76 1.36* 0.93  -C/w IVF Hydration as above with NS at 75 mL/hr  -Had a slight acidosis with a CO2 of 21, anion gap of 8 and a chloride level of 99; Now CO2 is 25, AG is 6 and Chloride Level of 100 -Check Renal U/S and Bladder Scan; Renal U/S done and showed "Negative for hydronephrosis. Small left renal cyst, no imaging follow-up is recommended." -Placed Foley for above -Avoid Nephrotoxic Medications, Contrast Dyes, Hypotension and Dehydration to Ensure Adequate Renal Perfusion and will need to Renally Adjust Meds -Continue to Monitor and Trend Renal Function carefully and repeat CMP in the AM   Hypothyroidism -TSH was 1.460 and Free T4 was 1.69 -C/w Levothyroxine 100 mcg po Daily   Normocytic Anemia -Hgb/Hct Trend: Recent Labs  Lab 06/11/23 1527 06/12/23 0227 06/13/23 0403 06/14/23 0357  HGB 14.5 11.5* 11.0* 10.4*  HCT 42.2 34.8* 33.0* 32.2*  MCV 89.0 91.1 91.9 93.6  -Check Anemia Panel and showed an iron level of 91, UIBC 176, TIBC of 267, saturation ratio of 34%, ferritin level 138, folate level 12.4, vitamin B12 level of 1097 -Continue to Monitor for S/Sx of Bleeding; No overt bleeding noted -Repeat CBC in the AM  Hypoalbuminemia -Patient's Albumin Trend: Recent Labs  Lab 06/12/23 0227 06/13/23 0403  06/14/23 0357  ALBUMIN 3.7 3.4* 3.2*  -Continue to Monitor and Trend and repeat CMP in the AM  Overweight -Complicates overall prognosis and care -Estimated body mass index is 25.81 kg/m as calculated from the following:   Height as of this encounter: 5\' 4"  (1.626 m).   Weight as of this encounter: 68.2 kg.  -Weight Loss and Dietary Counseling given   DVT prophylaxis: SCDs Start: 06/11/23 1855    Code Status: Full Code Family Communication: Discussed with the patient's Son over the telephone  Disposition Plan:  Level of care: Med-Surg Status is: Inpatient Remains inpatient appropriate because: Anticipating D/C home in the AM if Na+ is further improved; IF she continues to Retain will need a Foley placed and TOV as an outpatient with Dr. Marlou Porch within 1 week   Consultants:  None  Procedures:  As delineated as above  Antimicrobials:  Anti-infectives (From admission, onward)  Start     Dose/Rate Route Frequency Ordered Stop   06/11/23 1700  fosfomycin (MONUROL) packet 3 g        3 g Oral  Once 06/11/23 1649 06/11/23 1701       Subjective: Seen and examined at bedside and was feeling better. Still retaining urine after Foley removed. No CP or SOB. Denies any other concerns or complaints at this time.   Objective: Vitals:   06/13/23 0500 06/13/23 0516 06/13/23 1940 06/14/23 0541  BP:  125/60 (!) 138/56 (!) 154/72  Pulse:  64 98 65  Resp:  16 16 16   Temp:  97.8 F (36.6 C) 97.7 F (36.5 C) 98.1 F (36.7 C)  TempSrc:  Oral Oral Oral  SpO2:  94% 94% 95%  Weight: 68.2 kg     Height:        Intake/Output Summary (Last 24 hours) at 06/14/2023 1745 Last data filed at 06/14/2023 1600 Gross per 24 hour  Intake 1673.48 ml  Output 4800 ml  Net -3126.52 ml   Filed Weights   06/11/23 1330 06/12/23 0500 06/13/23 0500  Weight: 68 kg (!) 148.8 kg 68.2 kg   Examination: Physical Exam:  Constitutional: WN/WD elderly Caucasian female in NAD appears mildly  anxious Respiratory: Diminished to auscultation bilaterally, no wheezing, rales, rhonchi or crackles. Normal respiratory effort and patient is not tachypenic. No accessory muscle use. Unlabored breathing  Cardiovascular: RRR, no murmurs / rubs / gallops. S1 and S2 auscultated. No extremity edema. Abdomen: Soft, non-tender, non-distended. Bowel sounds positive.  GU: Deferred. Musculoskeletal: No clubbing / cyanosis of digits/nails. No joint deformity upper and lower extremities. Skin: No rashes, lesions, ulcers on a limited skin evaluation. No induration; Warm and dry.  Neurologic: CN 2-12 grossly intact with no focal deficits.  Romberg sign cerebellar reflexes not assessed.  Psychiatric: Normal judgment and insight. Alert and oriented x 3. Slightly Anxious Mood  Data Reviewed: I have personally reviewed following labs and imaging studies  CBC: Recent Labs  Lab 06/11/23 1527 06/12/23 0227 06/13/23 0403 06/14/23 0357  WBC 5.8 6.2 6.1 7.6  NEUTROABS  --  3.2 3.7 4.6  HGB 14.5 11.5* 11.0* 10.4*  HCT 42.2 34.8* 33.0* 32.2*  MCV 89.0 91.1 91.9 93.6  PLT 273 242 205 185   Basic Metabolic Panel: Recent Labs  Lab 06/11/23 2119 06/12/23 0227 06/12/23 1812 06/13/23 0403 06/14/23 0357  NA 123* 124* 126* 128* 131*  K 3.7 3.6 4.0 4.0 4.0  CL 92* 94* 96* 99 100  CO2 23 22 21* 21* 25  GLUCOSE 158* 77 105* 92 92  BUN 9 11 8 14 12   CREATININE 0.86 0.80 0.76 1.36* 0.93  CALCIUM 8.9 8.8* 8.9 9.2 8.8*  MG  --  2.2  --  2.3 2.0  PHOS  --   --   --  3.9 3.3   GFR: Estimated Creatinine Clearance: 45 mL/min (by C-G formula based on SCr of 0.93 mg/dL). Liver Function Tests: Recent Labs  Lab 06/12/23 0227 06/13/23 0403 06/14/23 0357  AST 24 25 75*  ALT 20 22 50*  ALKPHOS 64 60 67  BILITOT 0.6 0.6 0.7  PROT 6.4* 5.9* 5.6*  ALBUMIN 3.7 3.4* 3.2*   No results for input(s): "LIPASE", "AMYLASE" in the last 168 hours. No results for input(s): "AMMONIA" in the last 168 hours. Coagulation  Profile: No results for input(s): "INR", "PROTIME" in the last 168 hours. Cardiac Enzymes: No results for input(s): "CKTOTAL", "CKMB", "CKMBINDEX", "TROPONINI" in the last  168 hours. BNP (last 3 results) No results for input(s): "PROBNP" in the last 8760 hours. HbA1C: No results for input(s): "HGBA1C" in the last 72 hours. CBG: No results for input(s): "GLUCAP" in the last 168 hours. Lipid Profile: No results for input(s): "CHOL", "HDL", "LDLCALC", "TRIG", "CHOLHDL", "LDLDIRECT" in the last 72 hours. Thyroid Function Tests: No results for input(s): "TSH", "T4TOTAL", "FREET4", "T3FREE", "THYROIDAB" in the last 72 hours.  Anemia Panel: Recent Labs    06/13/23 0403  VITAMINB12 1,097*  FOLATE 12.4  FERRITIN 138  TIBC 267  IRON 91  RETICCTPCT 1.1   Sepsis Labs: No results for input(s): "PROCALCITON", "LATICACIDVEN" in the last 168 hours.  Recent Results (from the past 240 hour(s))  Urine Culture     Status: None   Collection Time: 06/11/23  3:27 PM   Specimen: Urine, Clean Catch  Result Value Ref Range Status   Specimen Description   Final    URINE, CLEAN CATCH Performed at [facility], [address] [place], [place] [address]    Special Requests   Final    NONE Performed at [facility], [address], [place], [place] [address]    Culture   Final    NO GROWTH Performed at [facility], [address], [place], [place] [address]    Report Status 06/12/2023 FINAL  Final  SARS Coronavirus 2 by RT PCR (hospital order, performed in [facility] hospital lab) *cepheid single result test* Anterior Nasal Swab     Status: None   Collection Time: 06/11/23  5:06 PM   Specimen: Anterior Nasal Swab  Result Value Ref Range Status   SARS Coronavirus 2 by RT PCR NEGATIVE NEGATIVE Final    Comment: (NOTE) SARS-CoV-2 target nucleic acids are NOT DETECTED.  The SARS-CoV-2 RNA is generally detectable in upper and lower respiratory  specimens during the acute phase of infection. The lowest concentration of SARS-CoV-2 viral copies this assay can detect is 250 copies / mL. A negative result does not preclude SARS-CoV-2 infection and should not be used as the sole basis for treatment or other patient management decisions.  A negative result may occur with improper specimen collection / handling, submission of specimen other than nasopharyngeal swab, presence of viral mutation(s) within the areas targeted by this assay, and inadequate number of viral copies (<250 copies / mL). A negative result must be combined with clinical observations, patient history, and epidemiological information.  Fact Sheet for Patients:   RoadLapTop.co.za  Fact Sheet for Healthcare Providers: http://kim-miller.com/  This test is not yet approved or  cleared by the [place] FDA and has been authorized for detection and/or diagnosis of SARS-CoV-2 by FDA under an Emergency Use Authorization (EUA).  This EUA will remain in effect (meaning this test can be used) for the duration of the COVID-19 declaration under Section 564(b)(1) of the Act, 21 U.S.C. section 360bbb-3(b)(1), unless the authorization is terminated or revoked sooner.  Performed at [facility], [address] [place], [place] [address]      Radiology Studies: US RENAL  Result Date: 06/13/2023 CLINICAL DATA:  Acute kidney injury EXAM: RENAL / URINARY TRACT ULTRASOUND COMPLETE COMPARISON:  CT 09/21/2020 FINDINGS: Right Kidney: Renal measurements: 11.4 x 4.1 x 4.4 cm = volume: 108.6 mL. Echogenicity within normal limits. No mass or hydronephrosis visualized. Left Kidney: Renal measurements: 11.9 x 4.8 x 4.9 cm = volume: 145.2 mL. Echogenicity within normal limits. Cyst at the upper pole measuring  13 mm. Bladder: Decompressed by Foley catheter. Other: None. IMPRESSION: Negative for hydronephrosis. Small left renal  cyst, no imaging follow-up is recommended. Electronically Signed   By: Jasmine Pang M.D.   On: 06/13/2023 19:45    Scheduled Meds:  acetaminophen  650 mg Oral Once   ALPRAZolam  0.5 mg Oral TID   amLODipine  2.5 mg Oral Daily   busPIRone  15 mg Oral BID   Chlorhexidine Gluconate Cloth  6 each Topical Daily   doxazosin  2 mg Oral Daily   feeding supplement  1 Container Oral TID BM   gabapentin  300 mg Oral BID   levothyroxine  100 mcg Oral Q0600   losartan  50 mg Oral BID   metoprolol succinate  12.5 mg Oral Daily   senna-docusate  2 tablet Oral QHS   traZODone  100 mg Oral QHS   zolpidem  5 mg Oral QHS   Continuous Infusions:  sodium chloride 75 mL/hr at 06/14/23 1645    LOS: 2 days   Marguerita Merles, DO [facility] Available via Epic secure chat 7am-7pm After these hours, please refer to coverage provider listed on [email or web address] 06/14/2023, 5:45 PM

## 2023-06-14 NOTE — Plan of Care (Signed)

## 2023-06-14 NOTE — Progress Notes (Signed)
Mobility Specialist - Progress Note   06/14/23 1133  Mobility  Activity Ambulated with assistance in hallway  Level of Assistance Standby assist, set-up cues, supervision of patient - no hands on  Assistive Device None  Distance Ambulated (ft) 350 ft  Range of Motion/Exercises Active  Activity Response Tolerated well  Mobility Referral Yes  $Mobility charge 1 Mobility  Mobility Specialist Start Time (ACUTE ONLY) 1125  Mobility Specialist Stop Time (ACUTE ONLY) 1133  Mobility Specialist Time Calculation (min) (ACUTE ONLY) 8 min   Pt received in bed and agreed to mobility. Had no issues throughout session, returned to bed with all needs met.  Marilynne Halsted Mobility Specialist

## 2023-06-15 ENCOUNTER — Inpatient Hospital Stay (HOSPITAL_COMMUNITY): Payer: Medicare PPO

## 2023-06-15 DIAGNOSIS — E871 Hypo-osmolality and hyponatremia: Secondary | ICD-10-CM | POA: Diagnosis not present

## 2023-06-15 NOTE — Discharge Summary (Signed)
Physician Discharge Summary   Patient: Debbie Wells MRN: [id number] DOB: [birth year]  Admit date:     06/11/2023  Discharge date: 06/15/23  Discharge Physician: Jacquelin Hawking, MD   PCP: Adrian Prince, MD   Recommendations at discharge:  PCP follow-up CMP in 3-5 days  Discharge Diagnoses: Principal Problem:   Hyponatremia Active Problems:   Anxiety about health   Moderate recurrent major depression (HCC)   Essential (primary) hypertension   Chronic insomnia   History of recurrent UTIs  Resolved Problems:   * No resolved hospital problems. *  Hospital Course: Debbie Wells is a 82 y.o. female with a history of depression, anxiety, hypertension, insomnia, recurrent UTIs.  Patient presented secondary to burning over her whole body after taking Lexapro and was found to have evidence of hyponatremia. Patient started on IV fluids with improvement of sodium. Hospitalization complicated by urinary retention, which resolved, in addition to AKI, which has resolved as well.  Assessment and Plan:  Hyponatremia Sodium of 120 on admission which improved with IV fluids. Sodium of 133 prior to discharge.  Major depression Continue home trazodone and Buspar  Anxiety Continue home Xanax and Buspar  History of recurrent UTIs Patient was given fosfomycin x1 while hospitalized. Continue outpatient Keflex.  Acute urinary retention Patient with retention during hospitalization requiring in/out catheterization. Resolved prior to discharge.  Elevated AST/ALT  Mild. Down-trending. Repeat CMP as an outpatient.  Insomnia Continue home Ambien.  Primary hypertension Continue amlodipine, losartan and metoprolol.  AKI Resolved.  Metabolic acidosis Likely secondary to AKI. Resolved.  Hypothyroidism Continue Synthroid  Normocytic anemia Mild. Outpatient follow-up.  Hypoalbuminemia Noted.  Overweight Estimated body mass index is 26.28 kg/m as calculated from the following:    Height as of this encounter: 5\' 4"  (1.626 m).   Weight as of this encounter: 69.4 kg.   Consultants: None Procedures performed: None  Disposition: Home Diet recommendation: Cardiac diet   DISCHARGE MEDICATION: Allergies as of 06/15/2023       Reactions   Hydrocodone-acetaminophen Rash   Vicodin=REACTION: rash   Iodinated Contrast Media Hives, Rash   Per patient she has developed hives and itching in upper trunk of body after last 2 CT scans once she got homes that she has just treated at home with benadryl. She has not told anyone until now. States she was able to bear it  Other reaction(s): Rash Per patient she has developed hives and itching in upper trunk of body after last 2 CT scans once she got homes that she has just treated at home with benadryl. She has not told anyone until now. States she was able to bear it    Sulfa Antibiotics Rash   REACTION: rash Other reaction(s): Unknown   Codeine    Other reaction(s): Unknown   Sulfonamide Derivatives    REACTION: rash   Terbinafine    Other reaction(s): Unknown   Benazepril Cough   Other reaction(s): Cough        Medication List     TAKE these medications    ALPRAZolam 0.5 MG tablet Commonly known as: XANAX Take 1 tablet by mouth 3 (three) times daily as needed.   amLODipine 2.5 MG tablet Commonly known as: NORVASC Take 2.5 mg by mouth daily.   busPIRone 15 MG tablet Commonly known as: BUSPAR Take 15 mg by mouth 2 (two) times daily.   carboxymethylcellul-glycerin 0.5-0.9 % ophthalmic solution Commonly known as: REFRESH OPTIVE Place 1 drop into both eyes 2 (two) times daily as  needed for dry eyes.   cephALEXin 250 MG capsule Commonly known as: KEFLEX Take 250 mg by mouth daily.   estradiol 0.1 MG/GM vaginal cream Commonly known as: ESTRACE Place 1 Applicatorful vaginally 2 (two) times a week.   FISH OIL PO Take 1 tablet by mouth daily.   gabapentin 100 MG capsule Commonly known as: NEURONTIN Take  200 mg by mouth 2 (two) times daily.   levothyroxine 100 MCG tablet Commonly known as: SYNTHROID Take 100 mcg by mouth daily.   losartan 50 MG tablet Commonly known as: COZAAR Take 50 mg by mouth 2 (two) times daily.   metoprolol succinate 25 MG 24 hr tablet Commonly known as: TOPROL-XL TAKE 1/2 TABLET BY MOUTH EVERY DAY   multivitamin capsule Take 1 capsule by mouth daily.   polyethylene glycol 17 g packet Commonly known as: MIRALAX / GLYCOLAX Take 17 g by mouth daily as needed for mild constipation.   saccharomyces boulardii 250 MG capsule Commonly known as: FLORASTOR Take 250 mg by mouth daily.   senna-docusate 8.6-50 MG tablet Commonly known as: Senokot-S Take 1 tablet by mouth at bedtime. What changed: how much to take   traZODone 100 MG tablet Commonly known as: DESYREL Take 100 mg by mouth at bedtime.   VITAMIN B 12 PO Take 1 tablet by mouth daily.   VITAMIN D (CHOLECALCIFEROL) PO Take 1 capsule by mouth daily.   zolpidem 5 MG tablet Commonly known as: AMBIEN Take 5 mg by mouth at bedtime.        Discharge Exam: BP (!) 150/71 (BP Location: Right Arm)   Pulse 72   Temp 97.9 F (36.6 C)   Resp 15   Ht 5\' 4"  (1.626 m)   Wt 69.4 kg   SpO2 94%   BMI 26.28 kg/m   General exam: Appears calm and comfortable Respiratory system: Clear to auscultation. Respiratory effort normal. Cardiovascular system: S1 & S2 heard, RRR. No murmurs. Gastrointestinal system: Abdomen is nondistended, soft and nontender. Normal bowel sounds heard. Central nervous system: Alert and oriented. No focal neurological deficits. Psychiatry: Judgement and insight appear normal. Mood & affect appropriate.   Condition at discharge: stable  The results of significant diagnostics from this hospitalization (including imaging, microbiology, ancillary and laboratory) are listed below for reference.   Imaging Studies: US Abdomen Limited RUQ (LIVER/GB)  Result Date:  06/15/2023 CLINICAL DATA:  Abnormal liver function tests EXAM: ULTRASOUND ABDOMEN LIMITED RIGHT UPPER QUADRANT COMPARISON:  CT abdomen pelvis 11/28/2012 FINDINGS: Gallbladder: Gallstones: None Sludge: None Gallbladder Wall: Within normal limits Pericholecystic fluid: None Sonographic Murphy's Sign: Negative per technologist Common bile duct: Diameter: 6 mm Liver: Parenchymal echogenicity: Diffusely increased Contours: Normal Lesions: None Portal vein: Patent.  Hepatopetal flow Other: None. IMPRESSION: 1. No acute sonographic abnormality of the liver or gallbladder. 2. Diffuse increased echogenicity of the hepatic parenchyma is a nonspecific indicator of hepatocellular dysfunction, most commonly steatosis. Electronically Signed   By: Acquanetta Belling M.D.   On: 06/15/2023 07:56   US RENAL  Result Date: 06/13/2023 CLINICAL DATA:  Acute kidney injury EXAM: RENAL / URINARY TRACT ULTRASOUND COMPLETE COMPARISON:  CT 09/21/2020 FINDINGS: Right Kidney: Renal measurements: 11.4 x 4.1 x 4.4 cm = volume: 108.6 mL. Echogenicity within normal limits. No mass or hydronephrosis visualized. Left Kidney: Renal measurements: 11.9 x 4.8 x 4.9 cm = volume: 145.2 mL. Echogenicity within normal limits. Cyst at the upper pole measuring 13 mm. Bladder: Decompressed by Foley catheter. Other: None. IMPRESSION: Negative for hydronephrosis. Small  left renal cyst, no imaging follow-up is recommended. Electronically Signed   By: Jasmine Pang M.D.   On: 06/13/2023 19:45    Microbiology: Results for orders placed or performed during the hospital encounter of 06/11/23  Urine Culture     Status: None   Collection Time: 06/11/23  3:27 PM   Specimen: Urine, Clean Catch  Result Value Ref Range Status   Specimen Description   Final    URINE, CLEAN CATCH Performed at [facility], [address], [place], [place] [address]    Special Requests   Final    NONE Performed at [facility], [address] [place], [place] [address]    Culture   Final    NO GROWTH Performed at [facility], [address] [place], [place] [address]    Report Status 06/12/2023 FINAL  Final  SARS Coronavirus 2 by RT PCR (hospital order, performed in [facility] hospital lab) *cepheid single result test* Anterior Nasal Swab     Status: None   Collection Time: 06/11/23  5:06 PM   Specimen: Anterior Nasal Swab  Result Value Ref Range Status   SARS Coronavirus 2 by RT PCR NEGATIVE NEGATIVE Final    Comment: (NOTE) SARS-CoV-2 target nucleic acids are NOT DETECTED.  The SARS-CoV-2 RNA is generally detectable in upper and lower respiratory specimens during the acute phase of infection. The lowest concentration of SARS-CoV-2 viral copies this assay can detect is 250 copies / mL. A negative result does not preclude SARS-CoV-2 infection and should not be used as the sole basis for treatment or other patient management decisions.  A negative result may occur with improper specimen collection / handling, submission of specimen other than nasopharyngeal swab, presence of viral mutation(s) within the areas targeted by this assay, and inadequate number of viral copies (<250 copies / mL). A negative result must be combined with clinical observations, patient history, and epidemiological information.  Fact Sheet for Patients:   RoadLapTop.co.za  Fact Sheet for Healthcare Providers: http://kim-miller.com/  This test is not yet approved or  cleared by the [place] FDA and has been authorized for detection and/or diagnosis of SARS-CoV-2 by FDA under an Emergency Use Authorization (EUA).  This EUA will remain in effect (meaning this test can be used) for the duration of the COVID-19 declaration under Section 564(b)(1) of the Act, 21 U.S.C. section 360bbb-3(b)(1), unless the authorization is terminated or revoked sooner.  Performed at [facility], [address], [place], [place] [address]     Labs: CBC: Recent Labs  Lab 06/11/23 1527 06/12/23 0227 06/13/23 0403 06/14/23 0357 06/15/23 0340  WBC 5.8 6.2 6.1 7.6 5.1  NEUTROABS  --  3.2 3.7 4.6 2.6  HGB 14.5 11.5* 11.0* 10.4* 10.4*  HCT 42.2 34.8* 33.0* 32.2* 32.1*  MCV 89.0 91.1 91.9 93.6 92.8  PLT 273 242 205 185 181   Basic Metabolic Panel: Recent Labs  Lab 06/12/23 0227 06/12/23 1812 06/13/23 0403 06/14/23 0357 06/15/23 0340  NA 124* 126* 128* 131* 133*  K 3.6 4.0 4.0 4.0 3.8  CL 94* 96* 99 100 102  CO2 22 21* 21* 25 25  GLUCOSE 77 105* 92 92 95  BUN 11 8 14 12 11   CREATININE 0.80 0.76 1.36* 0.93 0.73  CALCIUM 8.8* 8.9 9.2 8.8* 8.7*  MG 2.2  --  2.3 2.0 2.0  PHOS  --   --  3.9 3.3 3.6   Liver  Function Tests: Recent Labs  Lab 06/12/23 0227 06/13/23 0403 06/14/23 0357 06/15/23 0340  AST 24 25 75* 60*  ALT 20 22 50* 54*  ALKPHOS 64 60 67 85  BILITOT 0.6 0.6 0.7 0.8  PROT 6.4* 5.9* 5.6* 6.0*  ALBUMIN 3.7 3.4* 3.2* 3.3*    Discharge time spent: 35 minutes.  Signed: Jacquelin Hawking, MD [facility] 06/15/2023

## 2023-06-15 NOTE — Plan of Care (Signed)
  Problem: Education: Goal: Knowledge of General Education information will improve Description: Including pain rating scale, medication(s)/side effects and non-pharmacologic comfort measures 06/15/2023 0643 by Lavona Mound, RN Outcome: Progressing 06/15/2023 0530 by Lavona Mound, RN Outcome: Progressing   Problem: Health Behavior/Discharge Planning: Goal: Ability to manage health-related needs will improve 06/15/2023 0643 by Lavona Mound, RN Outcome: Progressing 06/15/2023 0530 by Lavona Mound, RN Outcome: Progressing   Problem: Clinical Measurements: Goal: Ability to maintain clinical measurements within normal limits will improve 06/15/2023 0643 by Lavona Mound, RN Outcome: Progressing 06/15/2023 0530 by Lavona Mound, RN Outcome: Progressing Goal: Will remain free from infection 06/15/2023 0643 by Lavona Mound, RN Outcome: Progressing 06/15/2023 0530 by Lavona Mound, RN Outcome: Progressing Goal: Diagnostic test results will improve 06/15/2023 0643 by Lavona Mound, RN Outcome: Progressing 06/15/2023 0530 by Lavona Mound, RN Outcome: Progressing Goal: Respiratory complications will improve 06/15/2023 0643 by Lavona Mound, RN Outcome: Progressing 06/15/2023 0530 by Lavona Mound, RN Outcome: Progressing Goal: Cardiovascular complication will be avoided 06/15/2023 0643 by Lavona Mound, RN Outcome: Progressing 06/15/2023 0530 by Lavona Mound, RN Outcome: Progressing   Problem: Activity: Goal: Risk for activity intolerance will decrease 06/15/2023 0643 by Lavona Mound, RN Outcome: Progressing 06/15/2023 0530 by Lavona Mound, RN Outcome: Progressing   Problem: Nutrition: Goal: Adequate nutrition will be maintained 06/15/2023 0643 by Lavona Mound, RN Outcome: Progressing 06/15/2023 0530 by Lavona Mound, RN Outcome: Progressing   Problem: Coping: Goal: Level of anxiety will  decrease 06/15/2023 0643 by Lavona Mound, RN Outcome: Progressing 06/15/2023 0530 by Lavona Mound, RN Outcome: Progressing   Problem: Elimination: Goal: Will not experience complications related to bowel motility 06/15/2023 0643 by Lavona Mound, RN Outcome: Progressing 06/15/2023 0530 by Lavona Mound, RN Outcome: Progressing Goal: Will not experience complications related to urinary retention 06/15/2023 0643 by Lavona Mound, RN Outcome: Progressing 06/15/2023 0530 by Lavona Mound, RN Outcome: Progressing   Problem: Pain Managment: Goal: General experience of comfort will improve 06/15/2023 0643 by Lavona Mound, RN Outcome: Progressing 06/15/2023 0530 by Lavona Mound, RN Outcome: Progressing   Problem: Safety: Goal: Ability to remain free from injury will improve 06/15/2023 0643 by , Alfonso Ramus, RN Outcome: Progressing 06/15/2023 0530 by Lavona Mound, RN Outcome: Progressing   Problem: Skin Integrity: Goal: Risk for impaired skin integrity will decrease 06/15/2023 0643 by Lavona Mound, RN Outcome: Progressing 06/15/2023 0530 by Lavona Mound, RN Outcome: Progressing  Pt A/Ox4 on RA. Still mildly retaining, bladder scans and post void. IVF running. Abd [place] completed this AM.

## 2023-06-15 NOTE — Discharge Instructions (Addendum)
Debbie Wells,  You are in the hospital because of low sodium (hyponatremia).  This is improved with IV fluids.  While you are in the hospital, you also had a transient case of urinary retention; thankfully this has resolved.  Please follow-up with your primary care physician as an outpatient.

## 2023-06-15 NOTE — Plan of Care (Signed)

## 2023-06-16 ENCOUNTER — Telehealth: Payer: Self-pay | Admitting: Family Medicine

## 2023-06-16 NOTE — Telephone Encounter (Signed)
Received a call from nursing stating patient's son was inquiring as to why gabapentin dose was returned to prior to admission dosing. Attempted to call Debbie Wells to explain, but call went to voicemail. Patient/family are encouraged to discuss gabapentin dosing with her primary care physician if they think increasing the dose for chronic management would be beneficial. Information relayed to nursing in case patient/family call back.

## 2023-06-16 NOTE — Progress Notes (Signed)
   06/15/23 1143  TOC Discharge Assessment  Final next level of care Other (comment) ([facility])  Once discharged, how will the patient get to their discharge location? Family/Friend - Partnered Transport  Has discharge transport plan been identified? Yes  Barriers to Discharge No Barriers Identified  Patient states their goals for this hospitalization and ongoing recovery are: To go home  Patient chooses bed at [facility]  Patient to be transferred to facility by Friend  Name of family member notified self

## 2023-06-20 DIAGNOSIS — N3000 Acute cystitis without hematuria: Secondary | ICD-10-CM | POA: Diagnosis not present

## 2023-06-20 DIAGNOSIS — N3020 Other chronic cystitis without hematuria: Secondary | ICD-10-CM | POA: Diagnosis not present

## 2023-06-20 DIAGNOSIS — R3914 Feeling of incomplete bladder emptying: Secondary | ICD-10-CM | POA: Diagnosis not present

## 2023-07-05 ENCOUNTER — Ambulatory Visit: Payer: Medicare PPO | Admitting: Cardiology

## 2023-07-08 DIAGNOSIS — N3020 Other chronic cystitis without hematuria: Secondary | ICD-10-CM | POA: Diagnosis not present

## 2023-07-08 DIAGNOSIS — R109 Unspecified abdominal pain: Secondary | ICD-10-CM | POA: Diagnosis not present

## 2023-07-12 NOTE — Progress Notes (Deleted)
Cardiology Office Note:    Date:  07/12/2023   ID:  [name], DOB 1941/09/18, MRN [id number]  PCP:  Adrian Prince, MD   [facility] Providers Cardiologist:  Olga Millers, MD Cardiology APP:  Marcelino Duster, PA { Click to update primary MD,subspecialty MD or APP then REFRESH:1}    Referring MD: Adrian Prince, MD   No chief complaint on file. ***  History of Present Illness:    Debbie Wells is a 82 y.o. female with a hx of hypertension due to endocrine disorder, migraine, neuropathy, hypothyroidism, CKD 3A, HLD, dementia, chronic insomnia, and depression.  She was last seen by cardiology 04/23/2020.  We have been following for difficult to control blood pressure.  She wore a 24-hour blood pressure monitor which indicated wide swings in her blood pressure.  A 24-hour urine for metanephrines was normal.  PCP ruled out Cushing's syndrome.  Lexiscan Myoview 12/17/2012 was nonischemic with an EF of 60%.  She is intolerant to ACE inhibitors due to cough.  She became bradycardic with higher doses of beta-blocker.  GI did not feel she was having mesenteric ischemia as a possible cause for her abdominal pain. She is now on gabapentin for lower extremity tingling and pain.   She was seen in the ER 02/15/22 for trip and fall. She was treated without admission.   She is maintained on 2.5 mg amlodipine, 81 mg aspirin, 50 mg losartan in the morning, 12.5 mg Toprol.   I saw her in follow-up 07/13/2022 and she was having issues with discontinuation of HRT, starting Wellbutrin, and frequent nocturia.   She was hospitalized 06/2023 with burning over her whole body after taking Lexapro and found to have hyponatremia.  Sodium normalized with IV fluids, hospitalization, placated by urinary retention and AKI.  Sodium was 120 on admission improved to 133 prior to discharge.  When I last saw her 01/2023, she was awaiting an appointment with urogyn with [facility] [facility].  She had no cardiac complaints  but still had labile BP.  She returns today for routine cardiology follow-up.   Hypertension Labile BP -May need to switch ARB if her nighttime urination has not changed   Palpitations Continue BB   Hyperlipidemia  Followed by primary care Lipid panel 05/2022 Total chol 199 HDL 66 LDL 108 Trig 125 I do not feel strongly about repeating a lipid panel given her lack of ischemic disease and age.    She has requested to follow-up with me if Dr. Jens Som is unavailable.       Past Medical History:  Diagnosis Date   Adenomatous colon polyp    Adenomatous polyp of colon 12/03/2004   6mm   Anxiety    Chronic headaches    Chronic insomnia    Depression    Diverticulosis    External hemorrhoids    Hyperlipidemia    Hypertension    Hyperthyroidism    Hypothyroidism    Umbilical hernia     Past Surgical History:  Procedure Laterality Date   ABDOMINAL HYSTERECTOMY  1975   APPENDECTOMY  1975   COLONOSCOPY  03/03/2010,12/03/2004   ESOPHAGOGASTRODUODENOSCOPY  12/16/11   Dr. Stan Head   OOPHORECTOMY     spinal lipoma removed  2006   TOTAL ABDOMINAL HYSTERECTOMY      Current Medications: No outpatient medications have been marked as taking for the 07/13/23 encounter (Appointment) with Marcelino Duster, PA.     Allergies:   Hydrocodone-acetaminophen, Iodinated contrast media, Sulfa antibiotics, Codeine,  Sulfonamide derivatives, Terbinafine, and Benazepril   Social History   Socioeconomic History   Marital status: Married    Spouse name: Not on file   Number of children: 2   Years of education: Not on file   Highest education level: Not on file  Occupational History   Occupation: retired  Tobacco Use   Smoking status: Former    Current packs/day: 0.00    Types: Cigarettes    Quit date: 01/23/1993    Years since quitting: 30.4   Smokeless tobacco: Never  Vaping Use   Vaping status: Never Used  Substance and Sexual Activity   Alcohol use: Yes     Alcohol/week: 2.0 standard drinks of alcohol    Types: 2 Glasses of wine per week   Drug use: No   Sexual activity: Not on file  Other Topics Concern   Not on file  Social History Narrative   Patient is married she is retired and has 2 children   About 2 alcoholic beverages a week no drugs, no current tobacco she is a former smoker      Lives at home with her husband   Right handed   Social Determinants of Health   Financial Resource Strain: Low Risk  (10/03/2020)   Received from [facility], [facility]   Overall Financial Resource Strain (CARDIA)    Difficulty of Paying Living Expenses: Not hard at all  Food Insecurity: No Food Insecurity (06/11/2023)   Hunger Vital Sign    Worried About Running Out of Food in the Last Year: Never true    Ran Out of Food in the Last Year: Never true  Transportation Needs: No Transportation Needs (06/11/2023)   PRAPARE - Administrator, Civil Service (Medical): No    Lack of Transportation (Non-Medical): No  Physical Activity: Not on file  Stress: Stress Concern Present (10/03/2020)   Received from [facility], [facility] of Occupational [facility] - Occupational Stress Questionnaire    Feeling of Stress : Rather much  Social Connections: Unknown (03/16/2022)   Received from [facility], [facility]   Social Network    Social Network: Not on file     Family History: The patient's ***family history includes Addison's disease in her mother; Esophageal cancer in her father; Heart disease in her mother; Heart failure in her mother; Hypertension in her maternal grandmother and mother; Irritable bowel syndrome in her mother; Kidney disease in her mother; Other in her mother; Stomach cancer in her father; Stroke in her maternal grandmother and mother. There is no history of Colon cancer or Heart attack.  ROS:   Please see the history of present illness.    *** All other systems reviewed and are  negative.  EKGs/Labs/Other Studies Reviewed:    The following studies were reviewed today: ***      Recent Labs: 06/11/2023: TSH 1.460 06/15/2023: ALT 54; BUN 11; Creatinine, Ser 0.73; Hemoglobin 10.4; Magnesium 2.0; Platelets 181; Potassium 3.8; Sodium 133  Recent Lipid Panel No results found for: "CHOL", "TRIG", "HDL", "CHOLHDL", "VLDL", "LDLCALC", "LDLDIRECT"   Risk Assessment/Calculations:   {Does this patient have ATRIAL FIBRILLATION?:[id number]}  No BP recorded.  {Refresh Note OR Click here to enter BP  :1}***         Physical Exam:    VS:  There were no vitals taken for this visit.    Wt Readings from Last 3 Encounters:  06/15/23 153 lb 1.6 oz (69.4 kg)  03/17/23  154 lb (69.9 kg)  02/17/23 156 lb (70.8 kg)     GEN: *** Well nourished, well developed in no acute distress HEENT: Normal NECK: No JVD; No carotid bruits LYMPHATICS: No lymphadenopathy CARDIAC: ***RRR, no murmurs, rubs, gallops RESPIRATORY:  Clear to auscultation without rales, wheezing or rhonchi  ABDOMEN: Soft, non-tender, non-distended MUSCULOSKELETAL:  No edema; No deformity  SKIN: Warm and dry NEUROLOGIC:  Alert and oriented x 3 PSYCHIATRIC:  Normal affect   ASSESSMENT:    No diagnosis found. PLAN:    In order of problems listed above:  ***      {Are you ordering a CV Procedure (e.g. stress test, cath, DCCV, TEE, etc)?   Press F2        :[id number]}    Medication Adjustments/Labs and Tests Ordered: Current medicines are reviewed at length with the patient today.  Concerns regarding medicines are outlined above.  No orders of the defined types were placed in this encounter.  No orders of the defined types were placed in this encounter.   There are no Patient Instructions on file for this visit.   Signed, Marcelino Duster, PA  07/12/2023 12:22 PM    [facility]

## 2023-07-13 ENCOUNTER — Ambulatory Visit: Payer: Medicare PPO | Attending: Cardiology | Admitting: Physician Assistant

## 2023-08-03 DIAGNOSIS — N390 Urinary tract infection, site not specified: Secondary | ICD-10-CM | POA: Diagnosis not present

## 2023-08-03 DIAGNOSIS — Z1389 Encounter for screening for other disorder: Secondary | ICD-10-CM | POA: Diagnosis not present

## 2023-08-03 DIAGNOSIS — I129 Hypertensive chronic kidney disease with stage 1 through stage 4 chronic kidney disease, or unspecified chronic kidney disease: Secondary | ICD-10-CM | POA: Diagnosis not present

## 2023-08-03 DIAGNOSIS — N1831 Chronic kidney disease, stage 3a: Secondary | ICD-10-CM | POA: Diagnosis not present

## 2023-08-03 DIAGNOSIS — R7301 Impaired fasting glucose: Secondary | ICD-10-CM | POA: Diagnosis not present

## 2023-08-03 DIAGNOSIS — E871 Hypo-osmolality and hyponatremia: Secondary | ICD-10-CM | POA: Diagnosis not present

## 2023-08-03 DIAGNOSIS — Z0000 Encounter for general adult medical examination without abnormal findings: Secondary | ICD-10-CM | POA: Diagnosis not present

## 2023-08-03 DIAGNOSIS — F419 Anxiety disorder, unspecified: Secondary | ICD-10-CM | POA: Diagnosis not present

## 2023-08-03 DIAGNOSIS — F331 Major depressive disorder, recurrent, moderate: Secondary | ICD-10-CM | POA: Diagnosis not present

## 2023-08-03 DIAGNOSIS — E039 Hypothyroidism, unspecified: Secondary | ICD-10-CM | POA: Diagnosis not present

## 2023-08-07 ENCOUNTER — Emergency Department (HOSPITAL_COMMUNITY)
Admission: EM | Admit: 2023-08-07 | Discharge: 2023-08-07 | Disposition: A | Discharge status: Home / Self Care | Payer: Medicare PPO | Attending: Emergency Medicine | Admitting: Emergency Medicine

## 2023-08-07 ENCOUNTER — Emergency Department (HOSPITAL_COMMUNITY): Payer: Medicare PPO

## 2023-08-07 ENCOUNTER — Other Ambulatory Visit: Payer: Self-pay

## 2023-08-07 ENCOUNTER — Encounter (HOSPITAL_COMMUNITY): Payer: Self-pay

## 2023-08-07 DIAGNOSIS — N281 Cyst of kidney, acquired: Secondary | ICD-10-CM | POA: Diagnosis not present

## 2023-08-07 DIAGNOSIS — R1031 Right lower quadrant pain: Secondary | ICD-10-CM | POA: Diagnosis not present

## 2023-08-07 DIAGNOSIS — K5900 Constipation, unspecified: Secondary | ICD-10-CM | POA: Insufficient documentation

## 2023-08-07 DIAGNOSIS — R001 Bradycardia, unspecified: Secondary | ICD-10-CM | POA: Diagnosis not present

## 2023-08-07 DIAGNOSIS — E039 Hypothyroidism, unspecified: Secondary | ICD-10-CM | POA: Insufficient documentation

## 2023-08-07 DIAGNOSIS — I10 Essential (primary) hypertension: Secondary | ICD-10-CM | POA: Diagnosis not present

## 2023-08-07 DIAGNOSIS — R1084 Generalized abdominal pain: Secondary | ICD-10-CM | POA: Diagnosis not present

## 2023-08-07 DIAGNOSIS — R109 Unspecified abdominal pain: Secondary | ICD-10-CM

## 2023-08-07 DIAGNOSIS — R300 Dysuria: Secondary | ICD-10-CM | POA: Diagnosis not present

## 2023-08-07 DIAGNOSIS — N390 Urinary tract infection, site not specified: Secondary | ICD-10-CM | POA: Diagnosis not present

## 2023-08-07 DIAGNOSIS — Z79899 Other long term (current) drug therapy: Secondary | ICD-10-CM | POA: Diagnosis not present

## 2023-08-07 DIAGNOSIS — K429 Umbilical hernia without obstruction or gangrene: Secondary | ICD-10-CM | POA: Diagnosis not present

## 2023-08-07 DIAGNOSIS — R0689 Other abnormalities of breathing: Secondary | ICD-10-CM | POA: Diagnosis not present

## 2023-08-07 DIAGNOSIS — I959 Hypotension, unspecified: Secondary | ICD-10-CM | POA: Diagnosis not present

## 2023-08-07 LAB — COMPREHENSIVE METABOLIC PANEL
ALT: 15 U/L (ref 0–44)
AST: 21 U/L (ref 15–41)
Albumin: 4 g/dL (ref 3.5–5.0)
Alkaline Phosphatase: 60 U/L (ref 38–126)
Anion gap: 7 (ref 5–15)
BUN: 10 mg/dL (ref 8–23)
CO2: 26 mmol/L (ref 22–32)
Calcium: 9.2 mg/dL (ref 8.9–10.3)
Chloride: 99 mmol/L (ref 98–111)
Creatinine, Ser: 0.97 mg/dL (ref 0.44–1.00)
GFR, Estimated: 59 mL/min — ABNORMAL LOW (ref >60)
Glucose, Bld: 107 mg/dL — ABNORMAL HIGH (ref 70–99)
Potassium: 4 mmol/L (ref 3.5–5.1)
Sodium: 132 mmol/L — ABNORMAL LOW (ref 135–145)
Total Bilirubin: 1 mg/dL (ref 0.3–1.2)
Total Protein: 6.6 g/dL (ref 6.5–8.1)

## 2023-08-07 LAB — URINALYSIS, ROUTINE W REFLEX MICROSCOPIC
Bilirubin Urine: NEGATIVE
Glucose, UA: NEGATIVE mg/dL
Hgb urine dipstick: NEGATIVE
Ketones, ur: NEGATIVE mg/dL
Nitrite: NEGATIVE
Protein, ur: NEGATIVE mg/dL
Specific Gravity, Urine: 1.005 (ref 1.005–1.030)
WBC, UA: >50 WBC/hpf (ref 0–5)
pH: 7 (ref 5.0–8.0)

## 2023-08-07 LAB — CBC
HCT: 34 % — ABNORMAL LOW (ref 36.0–46.0)
Hemoglobin: 11.1 g/dL — ABNORMAL LOW (ref 12.0–15.0)
MCH: 31.3 pg (ref 26.0–34.0)
MCHC: 32.6 g/dL (ref 30.0–36.0)
MCV: 95.8 fL (ref 80.0–100.0)
Platelets: 223 10*3/uL (ref 150–400)
RBC: 3.55 MIL/uL — ABNORMAL LOW (ref 3.87–5.11)
RDW: 13.5 % (ref 11.5–15.5)
WBC: 5.6 10*3/uL (ref 4.0–10.5)
nRBC: 0 % (ref 0.0–0.2)

## 2023-08-07 LAB — LIPASE, BLOOD: Lipase: 36 U/L (ref 11–51)

## 2023-08-07 MED ORDER — SENNOSIDES-DOCUSATE SODIUM 8.6-50 MG PO TABS: 1.0000 | ORAL_TABLET | Freq: Two times a day (BID) | ORAL | 0 refills | Status: AC | PRN

## 2023-08-07 MED ORDER — CEFDINIR 300 MG PO CAPS: 300.0000 mg | ORAL_CAPSULE | Freq: Two times a day (BID) | ORAL | 0 refills | Status: AC

## 2023-08-07 MED ORDER — ONDANSETRON 4 MG PO TBDP: 4.0000 mg | ORAL_TABLET | Freq: Three times a day (TID) | ORAL | 0 refills | Status: DC | PRN

## 2023-08-07 MED ORDER — SODIUM CHLORIDE 0.9 % IV SOLN
1.0000 g | Freq: Once | INTRAVENOUS | Status: AC
  Administered 2023-08-07: 1 g via INTRAVENOUS
  Filled 2023-08-07: qty 10

## 2023-08-07 NOTE — ED Provider Notes (Signed)
[place] EMERGENCY DEPARTMENT AT [facility] Provider Note   CSN: [id number] Arrival date & time: 08/07/23  1204     History {Add pertinent medical, surgical, social history, OB history to HPI:1} Chief Complaint  Patient presents with   Urinary Tract Infection   Constipation    Debbie Wells is a 82 y.o. female.  HPI      82 year old female with a history of hypertension, hyperlipidemia, thyroid disease, depression, anxiety who comes from friends from assisted living facility with concern for recurrent UTI, abdominal pain and constipation.   One infection after another Took cipro for 30 days, everything looked great and UTI gone then started on another medication for 90 days, doxycycline, have been on that for 30 days.  For a few days has had infection symptoms, dysuria, frequency  No vomiting, some nausea Abdominal pain, struggle with constipation all the time, been taking miralax, day before yesterday began to have stomach pain, all over, burning pain.  Not really flank pain.  Had BM, small ones but not real BM.   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 Authorizing Provider  ALPRAZolam Prudy Feeler) 0.5 MG tablet Take 1 tablet by mouth 3 (three) times daily as needed. 10/22/20   [provider]  amLODipine (NORVASC) 2.5 MG tablet Take 2.5 mg by mouth daily.    [provider]  busPIRone (BUSPAR) 15 MG tablet Take 15 mg by mouth 2 (two) times daily. 12/03/20   [provider]  carboxymethylcellul-glycerin (REFRESH OPTIVE) 0.5-0.9 % ophthalmic solution Place 1 drop  into both eyes 2 (two) times daily as needed for dry eyes.    [provider]  cephALEXin (KEFLEX) 250 MG capsule Take 250 mg by mouth daily.    [provider]  Cyanocobalamin (VITAMIN B 12 PO) Take 1 tablet by mouth daily.    [provider]  estradiol (ESTRACE) 0.1 MG/GM vaginal cream Place 1 Applicatorful vaginally 2 (two) times a week. 02/28/23   [provider]  gabapentin (NEURONTIN) 100 MG capsule Take 200 mg by mouth 2 (two) times daily. 10/22/20   [provider]  levothyroxine (SYNTHROID) 100 MCG tablet Take 100 mcg by mouth daily. 06/18/22   [provider]  losartan (COZAAR) 50 MG tablet Take 50 mg by mouth 2 (two) times daily. 09/04/20   [provider]  metoprolol succinate (TOPROL-XL) 25 MG 24 hr tablet TAKE 1/2 TABLET BY MOUTH EVERY DAY Patient taking differently: Take 12.5 mg by mouth daily. 09/12/20   Lewayne Bunting, MD  Multiple Vitamin (MULTIVITAMIN) capsule Take 1 capsule by mouth daily.    [provider]  Omega-3 Fatty Acids (FISH OIL PO) Take 1 tablet by mouth daily.    [provider]  polyethylene glycol (MIRALAX / GLYCOLAX) packet Take 17 g by mouth daily as needed for mild constipation.    [provider]  saccharomyces boulardii (FLORASTOR) 250 MG capsule Take 250 mg by mouth daily.    [provider]  senna-docusate (SENOKOT-S)  8.6-50 MG tablet Take 1 tablet by mouth at bedtime. Patient taking differently: Take 2 tablets by mouth at bedtime. 09/10/20   Milagros Loll, MD  traZODone (DESYREL) 100 MG tablet Take 100 mg by mouth at bedtime.    [provider]  VITAMIN D, CHOLECALCIFEROL, PO Take 1 capsule by mouth daily.    [provider]  zolpidem (AMBIEN) 5 MG tablet Take 5 mg by mouth at bedtime. 02/08/23   [provider]      Allergies    Hydrocodone-acetaminophen, Iodinated contrast media, Sulfa antibiotics, Codeine, Sulfonamide  derivatives, Terbinafine, and Benazepril    Review of Systems   Review of Systems  Physical Exam Updated Vital Signs BP 130/66 (BP Location: Left Arm)   Pulse 62   Temp 97.7 F (36.5 C) (Oral)   Resp 16   Ht 5\' 4"  (1.626 m)   Wt 69.4 kg   SpO2 96%   BMI 26.26 kg/m  Physical Exam  ED Results / Procedures / Treatments   Labs (all labs ordered are listed, but only abnormal results are displayed) Labs Reviewed  COMPREHENSIVE METABOLIC PANEL - Abnormal; Notable for the following components:      Result Value   Sodium 132 (*)    Glucose, Bld 107 (*)    GFR, Estimated 59 (*)    All other components within normal limits  CBC - Abnormal; Notable for the following components:   RBC 3.55 (*)    Hemoglobin 11.1 (*)    HCT 34.0 (*)    All other components within normal limits  LIPASE, BLOOD  URINALYSIS, ROUTINE W REFLEX MICROSCOPIC    EKG None  Radiology No results found.  Procedures Procedures  {Document cardiac monitor, telemetry assessment procedure when appropriate:1}  Medications Ordered in ED Medications - No data to display  ED Course/ Medical Decision Making/ A&P   {   Click here for ABCD2, HEART and other calculatorsREFRESH Note before signing :1}                              Medical Decision Making Amount and/or Complexity of Data Reviewed Labs: ordered.    82 year old female with a history of hypertension, hyperlipidemia, thyroid disease, depression, anxiety who comes from friends from assisted living facility with concern for recurrent UTI, abdominal pain and constipation.   Labs completed and personally read interpreted by me are significant for a urinary tract infection.  Other labs show no sign of pancreatitis, no transaminitis or clinically significant electrolyte abnormalities, no clinically significant anemia {Document critical care time when appropriate:1} {Document review of labs and clinical decision tools ie heart score, Chads2Vasc2 etc:1}   {Document your independent review of radiology images, and any outside records:1} {Document your discussion with family members, caretakers, and with consultants:1} {Document social determinants of health affecting pt's care:1} {Document your decision making why or why not admission, treatments were needed:1} Final Clinical Impression(s) / ED Diagnoses Final diagnoses:  None    Rx / DC Orders ED Discharge Orders     None

## 2023-08-07 NOTE — ED Triage Notes (Signed)
Patient BIB EMS from [facility]. Reports recurrent UTI x 2 months with abx, abdominal pain and constipation x 3-4 days. Denies nausea and vomiting.

## 2023-08-07 NOTE — ED Notes (Signed)
Pt ambulated to the bathroom w/out any difficulties x1 SBA.

## 2023-08-07 NOTE — ED Notes (Signed)
Pt was unsuccessful at using the female urinal

## 2023-08-11 LAB — URINE CULTURE: Culture: >=100000 — AB

## 2023-08-12 ENCOUNTER — Telehealth (HOSPITAL_BASED_OUTPATIENT_CLINIC_OR_DEPARTMENT_OTHER): Payer: Self-pay

## 2023-08-12 NOTE — Telephone Encounter (Signed)
Post ED Visit - Positive Culture Follow-up: Successful Patient Follow-Up  Culture assessed and recommendations reviewed by:  [x]  Sharin Mons, Pharm.D. []  Celedonio Miyamoto, Pharm.D., BCPS AQ-ID []  Garvin Fila, Pharm.D., BCPS []  Georgina Pillion, Pharm.D., BCPS []  [place], [address].D., BCPS, AAHIVP []  Estella Husk, Pharm.D., BCPS, AAHIVP []  Lysle Pearl, PharmD, BCPS []  Phillips Climes, PharmD, BCPS []  Agapito Games, PharmD, BCPS []  Verlan Friends, PharmD  Positive urine culture  []  Patient discharged without antimicrobial prescription and treatment is now indicated [x]  Organism is resistant to prescribed ED discharge antimicrobial []  Patient with positive blood cultures  Changes discussed with ED provider: Estanislado Pandy, DO New antibiotic prescription Nitrofurantoin 100 mg po BID x 5 days Called to [facility]  Pt returned call stating she was prescribed augmentin yesterday by her provider Dr. Burnett Sheng based on her results. Requested that this prescription be called into her pharmacy. But she plans to follow up with her provider for clarification before picking up the prescription.   Contacted patient, date 08/12/23 , time 4:55 pm   Sandria Senter 08/12/2023, 4:58 PM

## 2023-08-12 NOTE — Telephone Encounter (Signed)
Post ED Visit - Positive Culture Follow-up: Successful Patient Follow-Up  Culture assessed and recommendations reviewed by:  [x]  Sharin Mons, Pharm.D. []  Celedonio Miyamoto, [address].D., BCPS AQ-ID []  Garvin Fila, Pharm.D., BCPS []  Georgina Pillion, Pharm.D., BCPS []  [place], [address].D., BCPS, AAHIVP []  Estella Husk, Pharm.D., BCPS, AAHIVP []  Lysle Pearl, PharmD, BCPS []  Phillips Climes, PharmD, BCPS []  Agapito Games, PharmD, BCPS []  Verlan Friends, PharmD  Positive urine culture  []  Patient discharged without antimicrobial prescription and treatment is now indicated [x]  Organism is resistant to prescribed ED discharge antimicrobial []  Patient with positive blood cultures  Changes discussed with ED provider: Estanislado Pandy, Do New antibiotic prescription Nitrofurantoin 100 mg po BID x 5 days  Called and faxed to [facility]  Contacted [facility] RN, date 08/12/2023, time 11:50 am    Sandria Senter 08/12/2023, 11:53 AM

## 2023-08-12 NOTE — Progress Notes (Signed)
ED Antimicrobial Stewardship Positive Culture Follow Up   Debbie Wells is an 82 y.o. female who presented to [facility] on 08/07/2023 with a chief complaint of  Chief Complaint  Patient presents with   Urinary Tract Infection   Constipation    Recent Results (from the past 720 hour(s))  Urine Culture     Status: Abnormal   Collection Time: 08/07/23  2:38 PM   Specimen: Urine, Clean Catch  Result Value Ref Range Status   Specimen Description   Final    URINE, CLEAN CATCH Performed at [facility], [address] [place], [place] [address]    Special Requests   Final    NONE Performed at [facility], [address], [place], [place] [address]    Culture (A)  Final    >=100,000 COLONIES/mL ENTEROCOCCUS FAECALIS 50,000 COLONIES/mL STAPHYLOCOCCUS HAEMOLYTICUS    Report Status 08/11/2023 FINAL  Final   Organism ID, Bacteria ENTEROCOCCUS FAECALIS (A)  Final   Organism ID, Bacteria STAPHYLOCOCCUS HAEMOLYTICUS (A)  Final      Susceptibility   Enterococcus faecalis - MIC*    AMPICILLIN <=2 SENSITIVE Sensitive     NITROFURANTOIN <=16 SENSITIVE Sensitive     VANCOMYCIN 1 SENSITIVE Sensitive     * >=100,000 COLONIES/mL ENTEROCOCCUS FAECALIS   Staphylococcus haemolyticus - MIC*    CIPROFLOXACIN >=8 RESISTANT Resistant     GENTAMICIN 8 INTERMEDIATE Intermediate     NITROFURANTOIN <=16 SENSITIVE Sensitive     OXACILLIN >=4 RESISTANT Resistant     TETRACYCLINE >=16 RESISTANT Resistant     VANCOMYCIN <=0.5 SENSITIVE Sensitive     TRIMETH/SULFA 160 RESISTANT Resistant     CLINDAMYCIN RESISTANT Resistant     RIFAMPIN <=0.5 SENSITIVE Sensitive     Inducible Clindamycin POSITIVE Resistant     * 50,000 COLONIES/mL STAPHYLOCOCCUS HAEMOLYTICUS    [x]  Treated with cefdinir 300 mg PO twice daily for 21 days, organism resistant to prescribed antimicrobial []  Patient discharged originally without antimicrobial agent and treatment is now indicated  New  antibiotic prescription: nitrofurantoin 100mg  twice daily for 5 days  Recommend patient follow up with urology outpatient.  ED Provider: Estanislado Pandy D.O.   Rickey Primus, PharmD Candidate [employer] Class of 2025 08/12/2023, 11:14 AM Clinical Pharmacist Monday - Friday phone -  [phone] Saturday - Sunday phone - [phone]

## 2023-08-15 ENCOUNTER — Telehealth: Payer: Self-pay | Admitting: Adult Health

## 2023-08-15 NOTE — Telephone Encounter (Signed)
Son, Annette Stable, called and LM at 4:30 wanting to speak with you about his mother.  Didn't give any details.  But we don't have a DPR on file for the son.  Only the husband.  She doesn't have an appt scheduled.  Last seen 03/29/23

## 2023-08-15 NOTE — Telephone Encounter (Signed)
Son said he has HCPOA and will fax it to [place].

## 2023-08-31 DIAGNOSIS — N3020 Other chronic cystitis without hematuria: Secondary | ICD-10-CM | POA: Diagnosis not present

## 2023-08-31 DIAGNOSIS — R8279 Other abnormal findings on microbiological examination of urine: Secondary | ICD-10-CM | POA: Diagnosis not present

## 2023-08-31 DIAGNOSIS — R300 Dysuria: Secondary | ICD-10-CM | POA: Diagnosis not present

## 2023-09-08 ENCOUNTER — Emergency Department (HOSPITAL_COMMUNITY): Payer: Medicare PPO

## 2023-09-08 ENCOUNTER — Emergency Department (HOSPITAL_COMMUNITY)
Admission: EM | Admit: 2023-09-08 | Discharge: 2023-09-08 | Disposition: A | Discharge status: Home / Self Care | Payer: Medicare PPO | Attending: Emergency Medicine | Admitting: Emergency Medicine

## 2023-09-08 ENCOUNTER — Other Ambulatory Visit: Payer: Self-pay

## 2023-09-08 ENCOUNTER — Encounter (HOSPITAL_COMMUNITY): Payer: Self-pay

## 2023-09-08 DIAGNOSIS — Z743 Need for continuous supervision: Secondary | ICD-10-CM | POA: Diagnosis not present

## 2023-09-08 DIAGNOSIS — Z79899 Other long term (current) drug therapy: Secondary | ICD-10-CM | POA: Insufficient documentation

## 2023-09-08 DIAGNOSIS — W01198A Fall on same level from slipping, tripping and stumbling with subsequent striking against other object, initial encounter: Secondary | ICD-10-CM | POA: Diagnosis not present

## 2023-09-08 DIAGNOSIS — R300 Dysuria: Secondary | ICD-10-CM | POA: Insufficient documentation

## 2023-09-08 DIAGNOSIS — R9089 Other abnormal findings on diagnostic imaging of central nervous system: Secondary | ICD-10-CM | POA: Diagnosis not present

## 2023-09-08 DIAGNOSIS — W19XXXA Unspecified fall, initial encounter: Secondary | ICD-10-CM | POA: Diagnosis not present

## 2023-09-08 DIAGNOSIS — E039 Hypothyroidism, unspecified: Secondary | ICD-10-CM | POA: Insufficient documentation

## 2023-09-08 DIAGNOSIS — I10 Essential (primary) hypertension: Secondary | ICD-10-CM | POA: Diagnosis not present

## 2023-09-08 DIAGNOSIS — R9431 Abnormal electrocardiogram [ECG] [EKG]: Secondary | ICD-10-CM | POA: Diagnosis not present

## 2023-09-08 DIAGNOSIS — R531 Weakness: Secondary | ICD-10-CM | POA: Diagnosis not present

## 2023-09-08 DIAGNOSIS — K5900 Constipation, unspecified: Secondary | ICD-10-CM

## 2023-09-08 DIAGNOSIS — R350 Frequency of micturition: Secondary | ICD-10-CM | POA: Diagnosis not present

## 2023-09-08 DIAGNOSIS — M542 Cervicalgia: Secondary | ICD-10-CM | POA: Diagnosis not present

## 2023-09-08 DIAGNOSIS — S0083XA Contusion of other part of head, initial encounter: Secondary | ICD-10-CM | POA: Diagnosis not present

## 2023-09-08 DIAGNOSIS — R69 Illness, unspecified: Secondary | ICD-10-CM | POA: Diagnosis not present

## 2023-09-08 DIAGNOSIS — M2578 Osteophyte, vertebrae: Secondary | ICD-10-CM | POA: Diagnosis not present

## 2023-09-08 DIAGNOSIS — S0990XA Unspecified injury of head, initial encounter: Secondary | ICD-10-CM | POA: Diagnosis present

## 2023-09-08 DIAGNOSIS — R519 Headache, unspecified: Secondary | ICD-10-CM | POA: Diagnosis not present

## 2023-09-08 DIAGNOSIS — R9082 White matter disease, unspecified: Secondary | ICD-10-CM | POA: Diagnosis not present

## 2023-09-08 LAB — CBC WITH DIFFERENTIAL/PLATELET
Abs Immature Granulocytes: 0.03 10*3/uL (ref 0.00–0.07)
Basophils Absolute: 0.1 10*3/uL (ref 0.0–0.1)
Basophils Relative: 1 %
Eosinophils Absolute: 0.1 10*3/uL (ref 0.0–0.5)
Eosinophils Relative: 2 %
HCT: 34.4 % — ABNORMAL LOW (ref 36.0–46.0)
Hemoglobin: 11.5 g/dL — ABNORMAL LOW (ref 12.0–15.0)
Immature Granulocytes: 1 %
Lymphocytes Relative: 18 %
Lymphs Abs: 1 10*3/uL (ref 0.7–4.0)
MCH: 31.5 pg (ref 26.0–34.0)
MCHC: 33.4 g/dL (ref 30.0–36.0)
MCV: 94.2 fL (ref 80.0–100.0)
Monocytes Absolute: 0.5 10*3/uL (ref 0.1–1.0)
Monocytes Relative: 9 %
Neutro Abs: 3.8 10*3/uL (ref 1.7–7.7)
Neutrophils Relative %: 69 %
Platelets: 239 10*3/uL (ref 150–400)
RBC: 3.65 MIL/uL — ABNORMAL LOW (ref 3.87–5.11)
RDW: 12.6 % (ref 11.5–15.5)
WBC: 5.5 10*3/uL (ref 4.0–10.5)
nRBC: 0 % (ref 0.0–0.2)

## 2023-09-08 LAB — COMPREHENSIVE METABOLIC PANEL
ALT: 18 U/L (ref 0–44)
AST: 21 U/L (ref 15–41)
Albumin: 4.3 g/dL (ref 3.5–5.0)
Alkaline Phosphatase: 76 U/L (ref 38–126)
Anion gap: 11 (ref 5–15)
BUN: 8 mg/dL (ref 8–23)
CO2: 24 mmol/L (ref 22–32)
Calcium: 9.7 mg/dL (ref 8.9–10.3)
Chloride: 98 mmol/L (ref 98–111)
Creatinine, Ser: 0.94 mg/dL (ref 0.44–1.00)
GFR, Estimated: >60 mL/min (ref >60)
Glucose, Bld: 104 mg/dL — ABNORMAL HIGH (ref 70–99)
Potassium: 4 mmol/L (ref 3.5–5.1)
Sodium: 133 mmol/L — ABNORMAL LOW (ref 135–145)
Total Bilirubin: 0.7 mg/dL (ref <1.2)
Total Protein: 7.3 g/dL (ref 6.5–8.1)

## 2023-09-08 LAB — URINALYSIS, W/ REFLEX TO CULTURE (INFECTION SUSPECTED)
Bacteria, UA: NONE SEEN
Bilirubin Urine: NEGATIVE
Glucose, UA: NEGATIVE mg/dL
Hgb urine dipstick: NEGATIVE
Ketones, ur: NEGATIVE mg/dL
Leukocytes,Ua: NEGATIVE
Nitrite: NEGATIVE
Protein, ur: NEGATIVE mg/dL
Specific Gravity, Urine: 1.003 — ABNORMAL LOW (ref 1.005–1.030)
pH: 7 (ref 5.0–8.0)

## 2023-09-08 MED ORDER — POLYETHYLENE GLYCOL 3350 17 G PO PACK
17.0000 g | PACK | Freq: Every day | ORAL | 0 refills | Status: AC
Start: 2023-09-08 — End: ?

## 2023-09-08 MED ORDER — ACETAMINOPHEN 325 MG PO TABS
650.0000 mg | ORAL_TABLET | Freq: Once | ORAL | Status: AC
  Administered 2023-09-08: 650 mg via ORAL
  Filled 2023-09-08: qty 2

## 2023-09-08 MED ORDER — SODIUM CHLORIDE 0.9 % IV BOLUS
500.0000 mL | Freq: Once | INTRAVENOUS | Status: AC
  Administered 2023-09-08: 500 mL via INTRAVENOUS

## 2023-09-08 NOTE — Discharge Instructions (Addendum)
As discussed take MiraLAX 1 capful twice daily until you have soft bowel movements.  Then 1 capful daily thereafter.  You may titrate to effect.  Please follow-up with your primary doctor and your urologist.  Return if develop any fevers, chills, chest pain, shortness of breath, abdominal pain, inability urinate or any new or worsening symptoms that are concerning to you.

## 2023-09-08 NOTE — ED Provider Notes (Signed)
[place] EMERGENCY DEPARTMENT AT [facility] Provider Note   CSN: [id number] Arrival date & time: 09/08/23  1118     History  Chief Complaint  Patient presents with   Debbie Wells         Debbie Wells is a 82 y.o. female with PMH as listed below who presents with fall, head strike, neck pain. Patient was trying to help her husband off the floor after he had fallen and they both fell again x2. She did strike her head but did not lose consciousness. She endorses posterior headache and neck pain.  She reports dysuria and states she is taking 90 days of doxycycline for UTI suppression. She endorses frequent urination and dysuria that are chronic for her. Denies abdominal pain, flank pain, or fevers/chills. She was seen in ED on 08/07/23 for UTI and given rocephin IV and cefdinir for UTI.    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 Authorizing Provider  ALPRAZolam Prudy Feeler) 0.5 MG tablet Take 1 tablet by mouth 3 (three) times daily as needed. 10/22/20   [provider]  amLODipine (NORVASC) 2.5 MG tablet Take 2.5 mg by mouth daily.    [provider]  busPIRone (BUSPAR) 15 MG tablet Take 15 mg by mouth 2 (two) times daily. 12/03/20   [provider]  carboxymethylcellul-glycerin (REFRESH OPTIVE) 0.5-0.9 % ophthalmic solution Place 1 drop into both eyes 2 (two) times daily as needed for dry eyes.    [provider]  cephALEXin (KEFLEX) 250 MG capsule Take 250 mg by mouth daily.    [provider]  Cyanocobalamin (VITAMIN B 12 PO) Take 1 tablet by mouth daily.     [provider]  estradiol (ESTRACE) 0.1 MG/GM vaginal cream Place 1 Applicatorful vaginally 2 (two) times a week. 02/28/23   [provider]  gabapentin (NEURONTIN) 100 MG capsule Take 200 mg by mouth 2 (two) times daily. 10/22/20   [provider]  levothyroxine (SYNTHROID) 100 MCG tablet Take 100 mcg by mouth daily. 06/18/22   [provider]  losartan (COZAAR) 50 MG tablet Take 50 mg by mouth 2 (two) times daily. 09/04/20   [provider]  metoprolol succinate (TOPROL-XL) 25 MG 24 hr tablet TAKE 1/2 TABLET BY MOUTH EVERY DAY Patient taking differently: Take 12.5 mg by mouth daily. 09/12/20   Lewayne Bunting, MD  Multiple Vitamin (MULTIVITAMIN) capsule Take 1 capsule by mouth daily.    [provider]  Omega-3 Fatty Acids (FISH OIL PO) Take 1 tablet by mouth daily.    [provider]  ondansetron (ZOFRAN-ODT) 4 MG disintegrating tablet Take 1 tablet (4 mg total) by mouth every 8 (eight) hours as needed for nausea or vomiting. 08/07/23   Alvira Monday, MD  polyethylene glycol (MIRALAX / GLYCOLAX) packet Take 17 g by mouth daily as needed for mild constipation.    [provider]  saccharomyces boulardii (FLORASTOR) 250 MG capsule Take 250 mg by mouth daily.    [provider]  traZODone (DESYREL) 100 MG tablet Take 100 mg by mouth at bedtime.    [provider]  VITAMIN D, CHOLECALCIFEROL, PO Take 1 capsule by mouth daily.    [provider]  zolpidem (AMBIEN) 5 MG tablet Take 5 mg by mouth at bedtime. 02/08/23   [provider]      Allergies    Hydrocodone-acetaminophen, Iodinated contrast media, Sulfa antibiotics, Codeine, Sulfonamide derivatives, Terbinafine, and Benazepril    Review of Systems   Review of Systems A 10 point review of systems was performed and is negative unless otherwise reported in HPI.  Physical Exam Updated Vital Signs BP (!) 126/94   Pulse (!) 57   Temp  98 F (36.7 C)   Resp 18   SpO2 95%  Physical Exam General: Normal appearing elderly female, lying in bed.  HEENT: PERRLA, EOMI, No skull depressions, no scalp lacerations/abrasions. Stable midface/forehead. No injuries to eyes or eyelids. Sclera anicteric, MMM, trachea midline. Small hematoma on right occiput. +Midline C spine TTP.  Cardiology: RRR, no murmurs/rubs/gallops. BL radial and DP pulses equal bilaterally.  Resp: Normal respiratory rate and effort. CTAB, no wheezes, rhonchi, crackles.  Abd: Soft, non-tender, non-distended. No rebound tenderness or guarding.  GU: Deferred. MSK: No peripheral edema or signs of trauma. Extremities without deformity or TTP. No cyanosis or clubbing. Skin: warm, dry. No rashes or lesions. Back: No CVA tenderness Neuro: A&Ox4, CNs II-XII grossly intact. MAEs. Sensation grossly intact.  Psych: Normal mood and affect.   ED Results / Procedures / Treatments   Labs (all labs ordered are listed, but only abnormal results are displayed) Labs Reviewed  URINALYSIS, W/ REFLEX TO CULTURE (INFECTION SUSPECTED) - Abnormal; Notable for the following components:      Result Value   Color, Urine STRAW (*)    Specific Gravity, Urine 1.003 (*)    All other components within normal limits  CBC WITH DIFFERENTIAL/PLATELET - Abnormal; Notable for the following components:   RBC 3.65 (*)    Hemoglobin 11.5 (*)    HCT 34.4 (*)    All other components within normal limits  COMPREHENSIVE METABOLIC PANEL - Abnormal; Notable for the following components:   Sodium 133 (*)    Glucose, Bld 104 (*)    All other components within normal limits    EKG EKG Interpretation Date/Time:  Thursday September 08 2023 13:55:54 EST Ventricular Rate:  68 PR Interval:  202 QRS Duration:  86 QT Interval:  410 QTC Calculation: 436 R Axis:   88  Text Interpretation: Sinus rhythm Borderline right axis deviation Confirmed by Estanislado Pandy [phone]) on 09/08/2023 5:50:24  PM  Radiology CT Head Wo Contrast  Result Date: 09/08/2023 CLINICAL DATA:  Fall, head injury, pain EXAM: CT HEAD WITHOUT CONTRAST CT CERVICAL SPINE WITHOUT CONTRAST TECHNIQUE: Multidetector CT imaging of the head and cervical spine was performed following the standard protocol without intravenous contrast. Multiplanar CT image reconstructions of the cervical spine were also generated. RADIATION DOSE REDUCTION: This exam was performed according to the departmental dose-optimization program which includes automated exposure control, adjustment of the mA and/or kV according to patient size and/or use of iterative reconstruction technique. COMPARISON:  02/15/2022 FINDINGS: CT HEAD FINDINGS Brain: No evidence of acute infarction, hemorrhage, hydrocephalus, extra-axial collection or mass lesion/mass effect. Periventricular and deep white matter hypodensity. Mild global cerebral volume loss. Vascular: No hyperdense vessel or unexpected calcification. Skull: Normal. Negative for fracture or focal lesion. Sinuses/Orbits: No acute finding. Other: None. CT CERVICAL SPINE  FINDINGS Alignment: Normal. Skull base and vertebrae: No acute fracture. No primary bone lesion or focal pathologic process. Soft tissues and spinal canal: No prevertebral fluid or swelling. No visible canal hematoma. Disc levels: Focally mild disc space height loss and osteophytosis of C5-C6 with otherwise intact disc spaces. Upper chest: Negative. Other: None. IMPRESSION: 1. No acute intracranial pathology. 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 2. No fracture or static subluxation of the cervical spine. 3. Focally mild disc space height loss and osteophytosis of C5-C6 with otherwise intact disc spaces. Electronically Signed   By: Jearld Lesch M.D.   On: 09/08/2023 13:53   CT Cervical Spine Wo Contrast  Result Date: 09/08/2023 CLINICAL DATA:  Fall, head injury, pain EXAM: CT HEAD WITHOUT CONTRAST CT CERVICAL SPINE  WITHOUT CONTRAST TECHNIQUE: Multidetector CT imaging of the head and cervical spine was performed following the standard protocol without intravenous contrast. Multiplanar CT image reconstructions of the cervical spine were also generated. RADIATION DOSE REDUCTION: This exam was performed according to the departmental dose-optimization program which includes automated exposure control, adjustment of the mA and/or kV according to patient size and/or use of iterative reconstruction technique. COMPARISON:  02/15/2022 FINDINGS: CT HEAD FINDINGS Brain: No evidence of acute infarction, hemorrhage, hydrocephalus, extra-axial collection or mass lesion/mass effect. Periventricular and deep white matter hypodensity. Mild global cerebral volume loss. Vascular: No hyperdense vessel or unexpected calcification. Skull: Normal. Negative for fracture or focal lesion. Sinuses/Orbits: No acute finding. Other: None. CT CERVICAL SPINE FINDINGS Alignment: Normal. Skull base and vertebrae: No acute fracture. No primary bone lesion or focal pathologic process. Soft tissues and spinal canal: No prevertebral fluid or swelling. No visible canal hematoma. Disc levels: Focally mild disc space height loss and osteophytosis of C5-C6 with otherwise intact disc spaces. Upper chest: Negative. Other: None. IMPRESSION: 1. No acute intracranial pathology. 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 2. No fracture or static subluxation of the cervical spine. 3. Focally mild disc space height loss and osteophytosis of C5-C6 with otherwise intact disc spaces. Electronically Signed   By: Jearld Lesch M.D.   On: 09/08/2023 13:53    Procedures Procedures    Medications Ordered in ED Medications  sodium chloride 0.9 % bolus 500 mL (0 mLs Intravenous Stopped 09/08/23 1624)  acetaminophen (TYLENOL) tablet 650 mg (650 mg Oral Given 09/08/23 1649)    ED Course/ Medical Decision Making/ A&P                          Medical  Decision Making Amount and/or Complexity of Data Reviewed Labs: ordered. Radiology: ordered. Decision-making details documented in ED Course.  Risk OTC drugs.    This patient presents to the ED for concern of fall, neck pain, this involves an extensive number of treatment options, and is a complaint that carries with it a high risk of complications and morbidity.  I considered the following differential and admission for this acute, potentially life threatening condition.   MDM:    DDX for trauma includes but is not limited to:  -Head Injury such as skull fx or ICH -fall with head trauma, will get CT head -Spinal Cord or Vertebral injury -patient does complain of neck pain so obtain C-spine CT -Fractures -no extremity deformities or pain to indicate extremity fracture  Cause of fall appears to be mechanical, as patient was trying to help her husband up from the floor. She does endorse dysuria/urinary frequency but  states she has been on chronic UTI suppression and at times states that her urinary sxs are chronic but also states that they may be worse now. Will get a urine to check for active UTI.  While patient is awaiting results of her imaging, she relays that she hasn't been able to urinate well for awhile, up to a day. Additionally states that it has been weeks since she has had a bowel movement. Feels very constipated and requests an enema. Likely urinary retention 2/2 constipation but also consider UTI. Will get labs and give enema per request, reevaluate.   Clinical Course as of 09/08/23 1400  Thu Sep 08, 2023  1358 CT Head Wo Contrast 1. No acute intracranial pathology. 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 2. No fracture or static subluxation of the cervical spine. 3. Focally mild disc space height loss and osteophytosis of C5-C6 with otherwise intact disc spaces.   [HN]    Clinical Course User Index [HN] Loetta Rough, MD    Labs: I  Ordered labs.  Results are pending at time of signout.  Imaging Studies ordered: I ordered imaging studies including CT head, CT C-spine I independently visualized and interpreted imaging. I agree with the radiologist interpretation  Additional history obtained from chart review, as well as patient's husband.   Reevaluation: After the interventions noted above, I reevaluated the patient and found that they have :improved  Social Determinants of Health:  lives independently  Disposition:  Patient is signed out to the oncoming ED physician who is made aware of her history, presentation, exam, workup, and plan.    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   I have reviewed the patients home medicines and have made adjustments as needed  Problem List / ED Course: Problem List Items Addressed This Visit   None Visit Diagnoses     Fall, initial encounter    -  Primary   Constipation, unspecified constipation type                       This note was created using dictation software, which may contain spelling or grammatical errors.    Loetta Rough, MD 09/16/23 [phone]

## 2023-09-08 NOTE — ED Notes (Addendum)
Asked patient if she was able to urinate, patient stated she was not sure if she could that she had not had anything to drink today. Informed patient we gave her some IV fluids and also some fluids with her tylenol she took. Scanned patient's bladder and found of urine. Patient then stated she felt like she could try to urinate. Patient urinated and after urination, this nurse scanned her bladder again. Patient had about in her bladder after voiding. Notified MD of results.

## 2023-09-08 NOTE — ED Notes (Signed)
Patient tolerate enema well. Patient does not feel like she has had an adequate amount of stool output. Patient had stool in bedside commode.

## 2023-09-08 NOTE — ED Provider Notes (Signed)
Received signout; disposition pending enema and voiding trial.  Patient had bowel movement after enema.  Able to urinate.  Physical Exam  BP (!) 172/61 (BP Location: Left Arm)   Pulse 68   Temp 98.7 F (37.1 C) (Oral)   Resp 18   SpO2 99%   Physical Exam Vitals and nursing note reviewed.  Constitutional:      General: She is not in acute distress.    Appearance: She is not toxic-appearing.  HENT:     Head: Normocephalic.     Nose: Nose normal.     Mouth/Throat:     Mouth: Mucous membranes are moist.  Eyes:     Conjunctiva/sclera: Conjunctivae normal.  Cardiovascular:     Rate and Rhythm: Normal rate and regular rhythm.  Pulmonary:     Effort: Pulmonary effort is normal.     Breath sounds: Normal breath sounds.  Abdominal:     General: Abdomen is flat. There is no distension.     Tenderness: There is no abdominal tenderness.  Neurological:     Mental Status: She is alert.     Procedures  Procedures  ED Course / MDM   Clinical Course as of 09/08/23 1924  Thu Sep 08, 2023  1358 CT Head Wo Contrast 1. No acute intracranial pathology. 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 2. No fracture or static subluxation of the cervical spine. 3. Focally mild disc space height loss and osteophytosis of C5-C6 with otherwise intact disc spaces.   [HN]    Clinical Course User Index [HN] Loetta Rough, MD   Medical Decision Making This is a 82 year old female seen primarily by morning team.  See Dr. Jearld Fenton note for full HPI.  On reevaluation, patient has had bowel movement after enema.  Has been able to void.  Follow-up labs reassuring no leukocytosis to suggest systemic infection.  Comprehensive metabolic panel with mild hyponatremia, but normal kidney function.  She was able to urinate after bowel movement.  Suspect her urinary retention secondary to constipation.  Discussed good bowel regimen.  Patient stable for discharge at this time.  Amount  and/or Complexity of Data Reviewed Labs: ordered. Radiology: ordered. Decision-making details documented in ED Course.  Risk OTC drugs.         Coral Spikes, DO 09/08/23 1924

## 2023-09-08 NOTE — ED Triage Notes (Signed)
Pt coming in following fall after trying to pick her husband off the floor. Pt did hit her head, and complaining of neck pain. Denies any LOC, or blood thinner use.

## 2023-09-10 ENCOUNTER — Emergency Department (HOSPITAL_BASED_OUTPATIENT_CLINIC_OR_DEPARTMENT_OTHER)
Admission: EM | Admit: 2023-09-10 | Discharge: 2023-09-11 | Disposition: A | Discharge status: Home / Self Care | Payer: Medicare PPO | Attending: Emergency Medicine | Admitting: Emergency Medicine

## 2023-09-10 ENCOUNTER — Other Ambulatory Visit: Payer: Self-pay

## 2023-09-10 ENCOUNTER — Encounter (HOSPITAL_BASED_OUTPATIENT_CLINIC_OR_DEPARTMENT_OTHER): Payer: Self-pay

## 2023-09-10 DIAGNOSIS — I10 Essential (primary) hypertension: Secondary | ICD-10-CM | POA: Diagnosis not present

## 2023-09-10 DIAGNOSIS — N3289 Other specified disorders of bladder: Secondary | ICD-10-CM | POA: Insufficient documentation

## 2023-09-10 DIAGNOSIS — R300 Dysuria: Secondary | ICD-10-CM | POA: Diagnosis present

## 2023-09-10 DIAGNOSIS — T7840XA Allergy, unspecified, initial encounter: Secondary | ICD-10-CM | POA: Diagnosis not present

## 2023-09-10 DIAGNOSIS — R531 Weakness: Secondary | ICD-10-CM | POA: Diagnosis not present

## 2023-09-10 DIAGNOSIS — L509 Urticaria, unspecified: Secondary | ICD-10-CM | POA: Diagnosis not present

## 2023-09-10 LAB — URINALYSIS, ROUTINE W REFLEX MICROSCOPIC
Bilirubin Urine: NEGATIVE
Glucose, UA: NEGATIVE mg/dL
Hgb urine dipstick: NEGATIVE
Leukocytes,Ua: NEGATIVE
Nitrite: NEGATIVE
Protein, ur: NEGATIVE mg/dL
Specific Gravity, Urine: <1.005 — ABNORMAL LOW (ref 1.005–1.030)
pH: 6.5 (ref 5.0–8.0)

## 2023-09-10 LAB — BASIC METABOLIC PANEL
Anion gap: 9 (ref 5–15)
BUN: 10 mg/dL (ref 8–23)
CO2: 25 mmol/L (ref 22–32)
Calcium: 10.3 mg/dL (ref 8.9–10.3)
Chloride: 96 mmol/L — ABNORMAL LOW (ref 98–111)
Creatinine, Ser: 1.02 mg/dL — ABNORMAL HIGH (ref 0.44–1.00)
GFR, Estimated: 55 mL/min — ABNORMAL LOW (ref >60)
Glucose, Bld: 91 mg/dL (ref 70–99)
Potassium: 4.3 mmol/L (ref 3.5–5.1)
Sodium: 130 mmol/L — ABNORMAL LOW (ref 135–145)

## 2023-09-10 LAB — CBC
HCT: 34.9 % — ABNORMAL LOW (ref 36.0–46.0)
Hemoglobin: 11.9 g/dL — ABNORMAL LOW (ref 12.0–15.0)
MCH: 31 pg (ref 26.0–34.0)
MCHC: 34.1 g/dL (ref 30.0–36.0)
MCV: 90.9 fL (ref 80.0–100.0)
Platelets: 254 10*3/uL (ref 150–400)
RBC: 3.84 MIL/uL — ABNORMAL LOW (ref 3.87–5.11)
RDW: 12.8 % (ref 11.5–15.5)
WBC: 5.3 10*3/uL (ref 4.0–10.5)
nRBC: 0 % (ref 0.0–0.2)

## 2023-09-10 LAB — MAGNESIUM: Magnesium: 1.9 mg/dL (ref 1.7–2.4)

## 2023-09-10 LAB — WET PREP, GENITAL
Clue Cells Wet Prep HPF POC: NONE SEEN
Sperm: NONE SEEN
Trich, Wet Prep: NONE SEEN
WBC, Wet Prep HPF POC: >=10 — AB (ref <10)
Yeast Wet Prep HPF POC: NONE SEEN

## 2023-09-10 MED ORDER — HYDROXYZINE HCL 10 MG PO TABS
10.0000 mg | ORAL_TABLET | Freq: Once | ORAL | Status: AC
Start: 2023-09-10 — End: 2023-09-10
  Administered 2023-09-10: 10 mg via ORAL
  Filled 2023-09-10: qty 1

## 2023-09-10 MED ORDER — IBUPROFEN 400 MG PO TABS
600.0000 mg | ORAL_TABLET | Freq: Once | ORAL | Status: AC
  Administered 2023-09-10: 600 mg via ORAL
  Filled 2023-09-10: qty 1

## 2023-09-10 MED ORDER — GABAPENTIN 100 MG PO CAPS
100.0000 mg | ORAL_CAPSULE | Freq: Once | ORAL | Status: AC
  Administered 2023-09-10: 100 mg via ORAL
  Filled 2023-09-10: qty 1

## 2023-09-10 MED ORDER — DOXYCYCLINE HYCLATE 100 MG PO TABS
100.0000 mg | ORAL_TABLET | Freq: Once | ORAL | Status: AC
  Administered 2023-09-10: 100 mg via ORAL
  Filled 2023-09-10: qty 1

## 2023-09-10 MED ORDER — ALPRAZOLAM 0.5 MG PO TABS
1.0000 mg | ORAL_TABLET | Freq: Once | ORAL | Status: AC
Start: 2023-09-10 — End: 2023-09-10
  Administered 2023-09-10: 1 mg via ORAL
  Filled 2023-09-10: qty 2

## 2023-09-10 NOTE — ED Notes (Signed)
Spoke to son Annette Stable on phone and update given.

## 2023-09-10 NOTE — Discharge Instructions (Addendum)
Please follow-up with your urologist.

## 2023-09-10 NOTE — ED Notes (Signed)
-  Called [facility] for [facility] transportation back to [facility] at 1010pm.

## 2023-09-10 NOTE — ED Provider Notes (Signed)
[place] EMERGENCY DEPARTMENT AT [facility] Provider Note   CSN: [id number] Arrival date & time: 09/10/23  1714     History  Chief Complaint  Patient presents with   Dysuria    Debbie Wells is a 82 y.o. female presenting to ED with complaint of generalized body aches and burning and vaginal itching.  Patient is on chronic doxycycline for UTI prophylaxis.  She reports that she has had burning all over her body but particularly burning near her vagina without itching since this morning.  She reports has had this happen in the past but cannot recall what the issue was.  She has a complicated medical history of recurring and extended resistance UTI's.  She was seen in the ED 2 days ago after a fall with unremarkable imaging and blood test, and a UA did not show evidence of infection at that time.  Patient reports concerns that she may have been exposed to a new detergent to a cleaning lady at her house.  She did try to take a Benadryl this morning.  It did help some with the  burning but the symptom has returned  Update, spoke to the patient's son reports patient has a history of extreme anxiety.  He says there have been issues the patient being noncompliant with her medications at home because she does not want to take multiple medicines.  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  They are planning to transfer her to assisted living on Monday in 2 days.  HPI     Home Medications Prior to Admission medications   Medication Sig Start Date End Date Taking? Authorizing Provider  ALPRAZolam Prudy Feeler) 0.5 MG tablet Take 1 tablet by mouth 3 (three) times daily as needed. 10/22/20   [provider]  amLODipine (NORVASC) 2.5 MG tablet Take 2.5 mg by mouth daily.    [provider]  busPIRone (BUSPAR) 15 MG tablet Take 15 mg by mouth 2 (two) times daily. 12/03/20   [provider]   carboxymethylcellul-glycerin (REFRESH OPTIVE) 0.5-0.9 % ophthalmic solution Place 1 drop into both eyes 2 (two) times daily as needed for dry eyes.    [provider]  cephALEXin (KEFLEX) 250 MG capsule Take 250 mg by mouth daily.    [provider]  Cyanocobalamin (VITAMIN B 12 PO) Take 1 tablet by mouth daily.    [provider]  estradiol (ESTRACE) 0.1 MG/GM vaginal cream Place 1 Applicatorful vaginally 2 (two) times a week. 02/28/23   [provider]  gabapentin (NEURONTIN) 100 MG capsule Take 200 mg by mouth 2 (two) times daily. 10/22/20   [provider]  levothyroxine (SYNTHROID) 100 MCG tablet Take 100 mcg by mouth daily. 06/18/22   [provider]  losartan (COZAAR) 50 MG tablet Take 50 mg by mouth 2 (two) times daily. 09/04/20   [provider]  metoprolol succinate (TOPROL-XL) 25 MG 24 hr tablet TAKE 1/2 TABLET BY MOUTH EVERY DAY Patient taking differently: Take 12.5 mg by mouth daily. 09/12/20   Lewayne Bunting, MD  Multiple Vitamin (MULTIVITAMIN) capsule Take 1 capsule by mouth daily.    [provider]  Omega-3 Fatty Acids (FISH OIL PO) Take 1 tablet by mouth daily.    [provider]  ondansetron (ZOFRAN-ODT) 4 MG disintegrating tablet Take 1 tablet (4 mg total) by mouth every 8 (eight) hours as needed for nausea or vomiting. 08/07/23  Alvira Monday, MD  polyethylene glycol (MIRALAX / GLYCOLAX) packet Take 17 g by mouth daily as needed for mild constipation.    [provider]  polyethylene glycol (MIRALAX) 17 g packet Take 17 g by mouth daily. 09/08/23   Coral Spikes, DO  saccharomyces boulardii (FLORASTOR) 250 MG capsule Take 250 mg by mouth daily.    [provider]  traZODone (DESYREL) 100 MG tablet Take 100 mg by mouth at bedtime.    [provider]  VITAMIN D, CHOLECALCIFEROL, PO Take 1 capsule by mouth daily.    [provider]  zolpidem (AMBIEN) 5 MG  tablet Take 5 mg by mouth at bedtime. 02/08/23   [provider]      Allergies    Hydrocodone-acetaminophen, Iodinated contrast media, Sulfa antibiotics, Codeine, Sulfonamide derivatives, Terbinafine, and Benazepril    Review of Systems   Review of Systems  Physical Exam Updated Vital Signs BP (!) 159/67   Pulse 68   Temp 98 F (36.7 C) (Oral)   Resp 19   Ht 5' 4.5" (1.638 m)   Wt 61.1 kg   SpO2 95%   BMI 22.75 kg/m  Physical Exam Constitutional:      General: She is not in acute distress.    Comments: Appears tired, sleepy  HENT:     Head: Normocephalic and atraumatic.  Eyes:     Conjunctiva/sclera: Conjunctivae normal.     Pupils: Pupils are equal, round, and reactive to light.  Cardiovascular:     Rate and Rhythm: Normal rate and regular rhythm.  Pulmonary:     Effort: Pulmonary effort is normal. No respiratory distress.  Abdominal:     General: There is no distension.     Tenderness: There is no abdominal tenderness.  Skin:    General: Skin is warm and dry.  Neurological:     General: No focal deficit present.     Mental Status: She is alert. Mental status is at baseline.  Psychiatric:        Mood and Affect: Mood normal.        Behavior: Behavior normal.     ED Results / Procedures / Treatments   Labs (all labs ordered are listed, but only abnormal results are displayed) Labs Reviewed  WET PREP, GENITAL - Abnormal; Notable for the following components:      Result Value   WBC, Wet Prep HPF POC >=10 (*)    All other components within normal limits  BASIC METABOLIC PANEL - Abnormal; Notable for the following components:   Sodium 130 (*)    Chloride 96 (*)    Creatinine, Ser 1.02 (*)    GFR, Estimated 55 (*)    All other components within normal limits  CBC - Abnormal; Notable for the following components:   RBC 3.84 (*)    Hemoglobin 11.9 (*)    HCT 34.9 (*)    All other components within normal limits  URINALYSIS, ROUTINE W REFLEX  MICROSCOPIC - Abnormal; Notable for the following components:   Color, Urine COLORLESS (*)    Specific Gravity, Urine <1.005 (*)    Ketones, ur TRACE (*)    All other components within normal limits  MAGNESIUM    EKG None  Radiology No results found.  Procedures Procedures    Medications Ordered in ED Medications  hydrOXYzine (ATARAX) tablet 10 mg (10 mg Oral Given 09/10/23 1828)  gabapentin (NEURONTIN) capsule 100 mg (100 mg Oral Given 09/10/23 1905)  ibuprofen (ADVIL)  tablet 600 mg (600 mg Oral Given 09/10/23 1905)  doxycycline (VIBRA-TABS) tablet 100 mg (100 mg Oral Given 09/10/23 2158)  ALPRAZolam Prudy Feeler) tablet 1 mg (1 mg Oral Given 09/10/23 2159)    ED Course/ Medical Decision Making/ A&P                                 Medical Decision Making Amount and/or Complexity of Data Reviewed Labs: ordered.  Risk Prescription drug management.   Patient is here with itching and burning of the body.  Differential would include allergic reaction versus atypical infection versus electrolyte derangement versus other.  She has a complicated psychosomatic history as well per my review of her medical record and external records.    She had a thorough workup in the ED twice in the past month, including CT imaging of the abdomen in October which was unremarkable for chronic abdominal pain.  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  Will check her electrolyte levels here as well as a wet prep for evidence of yeast infection or BV, and attempt a catheterized clean urine sample.  The evidence of anaphylaxis or significant allergic reaction.  Patient's UA and lab test are unremarkable.  Chronic baseline hyponatremia but no significant changes.  He can use complaint of pain all over.  She is very tearful on exam.  I strongly suspect this being driven by anxiety.  I was able to obtain supplemental history of the patient's son by phone, and we have  agreed that she can be discharged back to her home, with plan for family to move her to assisted living on Monday.  I think she is stable for discharge.  Her son clarifies that he had never wanted her brought to the hospital in the first place, because "these are chronic issues and they can't fix them in the ER, I understand that," and he was surprised that the medics called out still took her to the hospital.        Final Clinical Impression(s) / ED Diagnoses Final diagnoses:  Bladder spasm    Rx / DC Orders ED Discharge Orders     None         Leanna Hamid, Kermit Balo, MD 09/10/23 2325

## 2023-09-10 NOTE — ED Triage Notes (Signed)
EMS were called d/t pt. "Feels like I'm burning all over". She tells [place] she has I.C. and frequent uti's.

## 2023-09-10 NOTE — ED Triage Notes (Signed)
Pt. Arrived via EMS from [facility]. Pt. Took benadryl this am with no relief from feeling her body burning and burning in vagina.

## 2023-09-11 DIAGNOSIS — Z7401 Bed confinement status: Secondary | ICD-10-CM | POA: Diagnosis not present

## 2023-09-11 DIAGNOSIS — R531 Weakness: Secondary | ICD-10-CM | POA: Diagnosis not present

## 2023-09-13 ENCOUNTER — Telehealth: Payer: Self-pay | Admitting: Internal Medicine

## 2023-09-13 NOTE — Telephone Encounter (Signed)
Patient son Annette Stable called asking if Dr Chales Abrahams can please call him to have a discussion about patient since he lives out of town he is not available to be there when patient is seen. Can be reached at Ph# [phone]

## 2023-09-15 ENCOUNTER — Non-Acute Institutional Stay: Payer: Medicare PPO | Admitting: Internal Medicine

## 2023-09-15 DIAGNOSIS — I10 Essential (primary) hypertension: Secondary | ICD-10-CM | POA: Diagnosis not present

## 2023-09-15 DIAGNOSIS — F32A Depression, unspecified: Secondary | ICD-10-CM

## 2023-09-15 DIAGNOSIS — K5909 Other constipation: Secondary | ICD-10-CM

## 2023-09-15 DIAGNOSIS — E039 Hypothyroidism, unspecified: Secondary | ICD-10-CM | POA: Diagnosis not present

## 2023-09-15 DIAGNOSIS — Z87440 Personal history of urinary (tract) infections: Secondary | ICD-10-CM | POA: Diagnosis not present

## 2023-09-16 ENCOUNTER — Encounter: Payer: Self-pay | Admitting: Internal Medicine

## 2023-09-16 DIAGNOSIS — R1319 Other dysphagia: Secondary | ICD-10-CM | POA: Diagnosis not present

## 2023-09-16 DIAGNOSIS — F419 Anxiety disorder, unspecified: Secondary | ICD-10-CM | POA: Diagnosis not present

## 2023-09-16 DIAGNOSIS — R41841 Cognitive communication deficit: Secondary | ICD-10-CM | POA: Diagnosis not present

## 2023-09-16 DIAGNOSIS — R278 Other lack of coordination: Secondary | ICD-10-CM | POA: Diagnosis not present

## 2023-09-16 DIAGNOSIS — M6281 Muscle weakness (generalized): Secondary | ICD-10-CM | POA: Diagnosis not present

## 2023-09-16 DIAGNOSIS — F339 Major depressive disorder, recurrent, unspecified: Secondary | ICD-10-CM | POA: Diagnosis not present

## 2023-09-16 NOTE — Progress Notes (Signed)
Location:  [facility] of Service:  ALF (13)  Provider:   Code Status: DNR Goals of Care:     09/10/2023    5:51 PM  Advanced Directives  Does Patient Have a Medical Advance Directive? No  Would patient like information on creating a medical advance directive? No - Patient declined     Chief Complaint  Patient presents with   Care Management    HPI: Patient is a 82 y.o. female seen today for medical management of chronic diseases.    Patient is New admit to AL in [facility] Patient was living with her husband in [place] Husband is recently admitted in the hospital after a fall. Her son who is a Pensions consultant in [place] and a [place] is here with her.  And she also has a daughter in [place]  Patient with number medical issues Acute depression Patient has had issues with depression for many years.  Per son she was admitted in [facility] few years ago.  Was stabilized and discharged.  But recently in past few months there has been some confusion with her medications.  Not sure if she is taking it properly It seems like she has also seen  with behavioral health.Debbie Wells She had tried her on different medications. Presently patient is on Xanax, BuSpar, Cymbalta, trazodone, Ambien Per son  he did found a lot of meds in her apartment Her husband is a pharmacist who has been changing some of her medications  Recent weight loss This seems to be at chronic issue also. Has seen Dr. Leone Payor in the past.  She had a CT done which had not shown anything acute.  The weight loss has thought to be due to her anxiety and depression. She says she has no Appetite and Also feels Nauseated  Also has issues with chronic constipation  Insomnia on high doses of trazodone and Ambien History of neuropathy has seen Dr. Vickey Huger Recurrent UTIs Have seen urology Dr. Logan Bores, Dr. Marlou Porch in [facility] urology, has seen infectious disease.Last Note by Dr Logan Bores suggested Intestinal  Cystitis  Suppressive therapy ? On 2 antibiotics Not sure Why Feels weak and Tired all the time   Past Medical History:  Diagnosis Date   Adenomatous colon polyp    Adenomatous polyp of colon 12/03/2004   6mm   Anxiety    Chronic headaches    Chronic insomnia    Depression    Diverticulosis    External hemorrhoids    Hyperlipidemia    Hypertension    Hyperthyroidism    Hypothyroidism    Umbilical hernia     Past Surgical History:  Procedure Laterality Date   ABDOMINAL HYSTERECTOMY  1975   APPENDECTOMY  1975   COLONOSCOPY  03/03/2010,12/03/2004   ESOPHAGOGASTRODUODENOSCOPY  12/16/11   Dr. Stan Head   OOPHORECTOMY     spinal lipoma removed  2006   TOTAL ABDOMINAL HYSTERECTOMY      Allergies  Allergen Reactions   Hydrocodone-Acetaminophen Rash    Vicodin=REACTION: rash   Iodinated Contrast Media Hives and Rash    Per patient she has developed hives and itching in upper trunk of body after last 2 CT scans once she got homes that she has just treated at home with benadryl. She has not told anyone until now. States she was able to bear it  Other reaction(s): Rash Per patient she has developed hives and itching in upper trunk of body after last 2 CT scans once  she got homes that she has just treated at home with benadryl. She has not told anyone until now. States she was able to bear it    Sulfa Antibiotics Rash    REACTION: rash Other reaction(s): Unknown   Codeine     Other reaction(s): Unknown   Sulfonamide Derivatives     REACTION: rash   Terbinafine     Other reaction(s): Unknown   Benazepril Cough    Other reaction(s): Cough    Outpatient Encounter Medications as of 09/15/2023  Medication Sig   ALPRAZolam (XANAX) 0.5 MG tablet Take 1 tablet by mouth 3 (three) times daily as needed.   amLODipine (NORVASC) 2.5 MG tablet Take 2.5 mg by mouth daily.   busPIRone (BUSPAR) 15 MG tablet Take 15 mg by mouth 2 (two) times daily.   carboxymethylcellul-glycerin (REFRESH  OPTIVE) 0.5-0.9 % ophthalmic solution Place 1 drop into both eyes 2 (two) times daily as needed for dry eyes.   cephALEXin (KEFLEX) 250 MG capsule Take 250 mg by mouth daily.   Cyanocobalamin (VITAMIN B 12 PO) Take 1 tablet by mouth daily.   estradiol (ESTRACE) 0.1 MG/GM vaginal cream Place 1 Applicatorful vaginally 2 (two) times a week.   gabapentin (NEURONTIN) 100 MG capsule Take 200 mg by mouth 2 (two) times daily.   levothyroxine (SYNTHROID) 100 MCG tablet Take 100 mcg by mouth daily.   losartan (COZAAR) 50 MG tablet Take 50 mg by mouth 2 (two) times daily.   metoprolol succinate (TOPROL-XL) 25 MG 24 hr tablet TAKE 1/2 TABLET BY MOUTH EVERY DAY (Patient taking differently: Take 12.5 mg by mouth daily.)   Multiple Vitamin (MULTIVITAMIN) capsule Take 1 capsule by mouth daily.   Omega-3 Fatty Acids (FISH OIL PO) Take 1 tablet by mouth daily.   ondansetron (ZOFRAN-ODT) 4 MG disintegrating tablet Take 1 tablet (4 mg total) by mouth every 8 (eight) hours as needed for nausea or vomiting.   polyethylene glycol (MIRALAX / GLYCOLAX) packet Take 17 g by mouth daily as needed for mild constipation.   polyethylene glycol (MIRALAX) 17 g packet Take 17 g by mouth daily.   saccharomyces boulardii (FLORASTOR) 250 MG capsule Take 250 mg by mouth daily.   traZODone (DESYREL) 100 MG tablet Take 100 mg by mouth at bedtime as needed.   VITAMIN D, CHOLECALCIFEROL, PO Take 1 capsule by mouth daily.   zolpidem (AMBIEN) 5 MG tablet Take 5 mg by mouth at bedtime.   No facility-administered encounter medications on file as of 09/15/2023.    Review of Systems:  Review of Systems  Constitutional:  Positive for activity change, appetite change and unexpected weight change.  HENT: Negative.    Respiratory:  Negative for cough and shortness of breath.   Cardiovascular:  Negative for leg swelling.  Gastrointestinal:  Positive for constipation.  Genitourinary: Negative.   Musculoskeletal:  Negative for arthralgias,  gait problem and myalgias.  Skin: Negative.   Neurological:  Positive for weakness. Negative for dizziness.  Psychiatric/Behavioral:  Positive for confusion, dysphoric mood and sleep disturbance. The patient is nervous/anxious.     Health Maintenance  Topic Date Due   Medicare Annual Wellness (AWV)  Never done   Pneumonia Vaccine 11+ Years old (1 of 1 - PCV) Never done   DEXA SCAN  Never done   INFLUENZA VACCINE  06/02/2023   COVID-19 Vaccine (5 - 2023-24 season) 07/03/2023   DTaP/Tdap/Td (2 - Td or Tdap) 02/16/2032   Zoster Vaccines- Shingrix  Completed   HPV VACCINES  Aged Out   Hepatitis C Screening  Discontinued    Physical Exam: Vitals:   09/15/23 1116  BP: 122/68  Pulse: 71  Resp: 18  Temp: (!) 96.7 F (35.9 C)  Weight: 129 lb (58.5 kg)   Body mass index is 21.8 kg/m. Physical Exam Vitals reviewed.  Constitutional:      Appearance: Normal appearance.  HENT:     Head: Normocephalic.     Nose: Nose normal.     Mouth/Throat:     Mouth: Mucous membranes are moist.     Pharynx: Oropharynx is clear.  Eyes:     Pupils: Pupils are equal, round, and reactive to light.  Cardiovascular:     Rate and Rhythm: Normal rate and regular rhythm.     Pulses: Normal pulses.     Heart sounds: Normal heart sounds. No murmur heard. Pulmonary:     Effort: Pulmonary effort is normal.     Breath sounds: Normal breath sounds.  Abdominal:     General: Abdomen is flat. Bowel sounds are normal.     Palpations: Abdomen is soft.  Genitourinary:    General: Normal vulva.     Comments: No Redness or rash in vaginal area Musculoskeletal:        General: No swelling.     Cervical back: Neck supple.  Skin:    General: Skin is warm.  Neurological:     General: No focal deficit present.     Mental Status: She is alert and oriented to person, place, and time.  Psychiatric:        Mood and Affect: Mood normal.        Thought Content: Thought content normal.     Labs  reviewed: Basic Metabolic Panel: Recent Labs    06/11/23 1643 06/11/23 2119 06/13/23 0403 06/14/23 0357 06/15/23 0340 08/07/23 1245 09/08/23 1532 09/10/23 1823  NA  --    < > 128* 131* 133* 132* 133* 130*  K  --    < > 4.0 4.0 3.8 4.0 4.0 4.3  CL  --    < > 99 100 102 99 98 96*  CO2  --    < > 21* 25 25 26 24 25   GLUCOSE  --    < > 92 92 95 107* 104* 91  BUN  --    < > 14 12 11 10 8 10   CREATININE  --    < > 1.36* 0.93 0.73 0.97 0.94 1.02*  CALCIUM  --    < > 9.2 8.8* 8.7* 9.2 9.7 10.3  MG  --    < > 2.3 2.0 2.0  --   --  1.9  PHOS  --   --  3.9 3.3 3.6  --   --   --   TSH 1.460  --   --   --   --   --   --   --    < > = values in this interval not displayed.   Liver Function Tests: Recent Labs    06/15/23 0340 08/07/23 1245 09/08/23 1532  AST 60* 21 21  ALT 54* 15 18  ALKPHOS 85 60 76  BILITOT 0.8 1.0 0.7  PROT 6.0* 6.6 7.3  ALBUMIN 3.3* 4.0 4.3   Recent Labs    08/07/23 1245  LIPASE 36   No results for input(s): "AMMONIA" in the last 8760 hours. CBC: Recent Labs    06/14/23 0357 06/15/23 0340 08/07/23 1245 09/08/23 1532 09/10/23  1823  WBC 7.6 5.1 5.6 5.5 5.3  NEUTROABS 4.6 2.6  --  3.8  --   HGB 10.4* 10.4* 11.1* 11.5* 11.9*  HCT 32.2* 32.1* 34.0* 34.4* 34.9*  MCV 93.6 92.8 95.8 94.2 90.9  PLT 185 181 223 239 254   Lipid Panel: No results for input(s): "CHOL", "HDL", "LDLCALC", "TRIG", "CHOLHDL", "LDLDIRECT" in the last 8760 hours. No results found for: "HGBA1C"  Procedures since last visit: CT Head Wo Contrast  Result Date: 09/08/2023 CLINICAL DATA:  Fall, head injury, pain EXAM: CT HEAD WITHOUT CONTRAST CT CERVICAL SPINE WITHOUT CONTRAST TECHNIQUE: Multidetector CT imaging of the head and cervical spine was performed following the standard protocol without intravenous contrast. Multiplanar CT image reconstructions of the cervical spine were also generated. RADIATION DOSE REDUCTION: This exam was performed according to the departmental  dose-optimization program which includes automated exposure control, adjustment of the mA and/or kV according to patient size and/or use of iterative reconstruction technique. COMPARISON:  02/15/2022 FINDINGS: CT HEAD FINDINGS Brain: No evidence of acute infarction, hemorrhage, hydrocephalus, extra-axial collection or mass lesion/mass effect. Periventricular and deep white matter hypodensity. Mild global cerebral volume loss. Vascular: No hyperdense vessel or unexpected calcification. Skull: Normal. Negative for fracture or focal lesion. Sinuses/Orbits: No acute finding. Other: None. CT CERVICAL SPINE FINDINGS Alignment: Normal. Skull base and vertebrae: No acute fracture. No primary bone lesion or focal pathologic process. Soft tissues and spinal canal: No prevertebral fluid or swelling. No visible canal hematoma. Disc levels: Focally mild disc space height loss and osteophytosis of C5-C6 with otherwise intact disc spaces. Upper chest: Negative. Other: None. IMPRESSION: 1. No acute intracranial pathology. 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 2. No fracture or static subluxation of the cervical spine. 3. Focally mild disc space height loss and osteophytosis of C5-C6 with otherwise intact disc spaces. Electronically Signed   By: Jearld Lesch M.D.   On: 09/08/2023 13:53   CT Cervical Spine Wo Contrast  Result Date: 09/08/2023 CLINICAL DATA:  Fall, head injury, pain EXAM: CT HEAD WITHOUT CONTRAST CT CERVICAL SPINE WITHOUT CONTRAST TECHNIQUE: Multidetector CT imaging of the head and cervical spine was performed following the standard protocol without intravenous contrast. Multiplanar CT image reconstructions of the cervical spine were also generated. RADIATION DOSE REDUCTION: This exam was performed according to the departmental dose-optimization program which includes automated exposure control, adjustment of the mA and/or kV according to patient size and/or use of iterative  reconstruction technique. COMPARISON:  02/15/2022 FINDINGS: CT HEAD FINDINGS Brain: No evidence of acute infarction, hemorrhage, hydrocephalus, extra-axial collection or mass lesion/mass effect. Periventricular and deep white matter hypodensity. Mild global cerebral volume loss. Vascular: No hyperdense vessel or unexpected calcification. Skull: Normal. Negative for fracture or focal lesion. Sinuses/Orbits: No acute finding. Other: None. CT CERVICAL SPINE FINDINGS Alignment: Normal. Skull base and vertebrae: No acute fracture. No primary bone lesion or focal pathologic process. Soft tissues and spinal canal: No prevertebral fluid or swelling. No visible canal hematoma. Disc levels: Focally mild disc space height loss and osteophytosis of C5-C6 with otherwise intact disc spaces. Upper chest: Negative. Other: None. IMPRESSION: 1. No acute intracranial pathology. 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 2. No fracture or static subluxation of the cervical spine. 3. Focally mild disc space height loss and osteophytosis of C5-C6 with otherwise intact disc spaces. Electronically Signed   By: Jearld Lesch M.D.   On: 09/08/2023 13:53    Assessment/Plan 1. Essential (primary) hypertension Managed by Cardiology  On Losartan and Norvasc  2. Acquired hypothyroidism TSH was followed by Dr Evlyn Kanner We will repeat Labs here  3. Chronic constipation Restart her Senna and Miralax  4. Acute depression with Insomnia This seems to be her major issues for many years She has been under behavior health care but stopped going Discussed with the son Will restart her on Zyprexa which helped with her Psychosis Also add Remeron at night to help with her insomnia and her appetite Changed her trazodone to 50 mg Prn Discontinue Ambien    5. History of recurrent UTIs Most of her Cultures have been negative She has one visit from Dr Logan Bores who suggested Intestinal cystitis  Treatment still is  Cymbalta Can add Hydroxyzine later Will discontinue trimethoprim Just cephalexin for suppressive therapy Avoid frequent UAs 6 Insomnia Chronic Issue Trazodone Prn Discontinue Ambien And add Remeron 7 Anxiety Will Continue Buspar and Xanax Prn Also on Cymbalta Failed Lexapro due to Hyponatremia   Labs/tests ordered:  CBC,CMP,TSH,Lipid  Next appt:  Visit date not found

## 2023-09-19 DIAGNOSIS — R41841 Cognitive communication deficit: Secondary | ICD-10-CM | POA: Diagnosis not present

## 2023-09-19 DIAGNOSIS — F339 Major depressive disorder, recurrent, unspecified: Secondary | ICD-10-CM | POA: Diagnosis not present

## 2023-09-19 DIAGNOSIS — F419 Anxiety disorder, unspecified: Secondary | ICD-10-CM | POA: Diagnosis not present

## 2023-09-19 DIAGNOSIS — R1319 Other dysphagia: Secondary | ICD-10-CM | POA: Diagnosis not present

## 2023-09-20 DIAGNOSIS — M6281 Muscle weakness (generalized): Secondary | ICD-10-CM | POA: Diagnosis not present

## 2023-09-20 DIAGNOSIS — R2681 Unsteadiness on feet: Secondary | ICD-10-CM | POA: Diagnosis not present

## 2023-09-20 DIAGNOSIS — F339 Major depressive disorder, recurrent, unspecified: Secondary | ICD-10-CM | POA: Diagnosis not present

## 2023-09-20 DIAGNOSIS — F419 Anxiety disorder, unspecified: Secondary | ICD-10-CM | POA: Diagnosis not present

## 2023-09-20 DIAGNOSIS — R41841 Cognitive communication deficit: Secondary | ICD-10-CM | POA: Diagnosis not present

## 2023-09-20 DIAGNOSIS — R1319 Other dysphagia: Secondary | ICD-10-CM | POA: Diagnosis not present

## 2023-09-20 DIAGNOSIS — R278 Other lack of coordination: Secondary | ICD-10-CM | POA: Diagnosis not present

## 2023-09-21 ENCOUNTER — Telehealth: Payer: Self-pay | Admitting: Internal Medicine

## 2023-09-21 ENCOUNTER — Encounter: Payer: Self-pay | Admitting: Orthopedic Surgery

## 2023-09-21 ENCOUNTER — Non-Acute Institutional Stay: Payer: Medicare PPO | Admitting: Orthopedic Surgery

## 2023-09-21 DIAGNOSIS — F419 Anxiety disorder, unspecified: Secondary | ICD-10-CM

## 2023-09-21 DIAGNOSIS — F333 Major depressive disorder, recurrent, severe with psychotic symptoms: Secondary | ICD-10-CM | POA: Diagnosis not present

## 2023-09-21 DIAGNOSIS — F5104 Psychophysiologic insomnia: Secondary | ICD-10-CM | POA: Diagnosis not present

## 2023-09-21 DIAGNOSIS — R1319 Other dysphagia: Secondary | ICD-10-CM | POA: Diagnosis not present

## 2023-09-21 DIAGNOSIS — N390 Urinary tract infection, site not specified: Secondary | ICD-10-CM

## 2023-09-21 DIAGNOSIS — F339 Major depressive disorder, recurrent, unspecified: Secondary | ICD-10-CM | POA: Diagnosis not present

## 2023-09-21 DIAGNOSIS — R41841 Cognitive communication deficit: Secondary | ICD-10-CM | POA: Diagnosis not present

## 2023-09-21 NOTE — Progress Notes (Addendum)
Location:  [facility] Room Number: 35/A Place of Service:  [id number]) Provider:  Octavia Heir, NP   Mahlon Gammon, MD  Patient Care Team: Mahlon Gammon, MD as PCP - General (Internal Medicine) Lewayne Bunting, MD as PCP - Cardiology (Cardiology) Duke, Roe Rutherford, PA as Physician Assistant (Cardiology)  Extended Emergency Contact Information Primary Emergency Contact: Toy Care Address: [address]          [place], [place] [address] Darden Amber of [place] Home Phone: [phone] Mobile Phone: [phone] Relation: Spouse Secondary Emergency Contact: Hattery,William Mobile Phone: [phone] Relation: Son  Code Status:  DNR Goals of care: Advanced Directive information    09/10/2023    5:51 PM  Advanced Directives  Does Patient Have a Medical Advance Directive? No  Would patient like information on creating a medical advance directive? No - Patient declined     Chief Complaint  Patient presents with   Acute Visit    dysuria    HPI:  Pt is a 82 y.o. female seen today for acute visit due to dysuria.   She recently moved to AL at [facility] 09/12/2023. PMH: HTN, HLD, migraines, IBS, thyroid disease, neuropathy, CKD, recurrent UTI, major depression, anxiety and insomnia.   Daughter present during encounter.   H/o recurrent UTI. Past cultures have been negative in [facility]. She is followed by Dr. Marlou Porch with [facility]. She has also seen Dr. Logan Bores suggested interstitial cystitis over UTI. She was on Keflex and Trimethoprim. Dr. Chales Abrahams stopped Trimethoprim last week. She is now c/o dysuria. Does not drink fluids well. Appears very anxious today.  H/o major depression with psychosis and anxiety. Nursing reports no mood changes and intermittent sleep. Family also reports long history of insomnia. Ambien was discontinued last week. She was started on Zyprexa and Remeron. Trazodone also reduced. She continues to sometimes refuse  medications.    Past Medical History:  Diagnosis Date   Adenomatous colon polyp    Adenomatous polyp of colon 12/03/2004   6mm   Anxiety    Chronic headaches    Chronic insomnia    Depression    Diverticulosis    External hemorrhoids    Hyperlipidemia    Hypertension    Hyperthyroidism    Hypothyroidism    Umbilical hernia    Past Surgical History:  Procedure Laterality Date   ABDOMINAL HYSTERECTOMY  1975   APPENDECTOMY  1975   COLONOSCOPY  03/03/2010,12/03/2004   ESOPHAGOGASTRODUODENOSCOPY  12/16/11   Dr. Stan Head   OOPHORECTOMY     spinal lipoma removed  2006   TOTAL ABDOMINAL HYSTERECTOMY      Allergies  Allergen Reactions   Hydrocodone-Acetaminophen Rash    Vicodin=REACTION: rash   Iodinated Contrast Media Hives and Rash    Per patient she has developed hives and itching in upper trunk of body after last 2 CT scans once she got homes that she has just treated at home with benadryl. She has not told anyone until now. States she was able to bear it  Other reaction(s): Rash Per patient she has developed hives and itching in upper trunk of body after last 2 CT scans once she got homes that she has just treated at home with benadryl. She has not told anyone until now. States she was able to bear it    Sulfa Antibiotics Rash    REACTION: rash Other reaction(s): Unknown   Codeine     Other reaction(s):  Unknown   Sulfonamide Derivatives     REACTION: rash   Terbinafine     Other reaction(s): Unknown   Benazepril Cough    Other reaction(s): Cough    Outpatient Encounter Medications as of 09/21/2023  Medication Sig   ALPRAZolam (XANAX) 0.5 MG tablet Take 1 tablet by mouth 3 (three) times daily as needed.   amLODipine (NORVASC) 2.5 MG tablet Take 2.5 mg by mouth daily.   busPIRone (BUSPAR) 15 MG tablet Take 15 mg by mouth 2 (two) times daily.   carboxymethylcellul-glycerin (REFRESH OPTIVE) 0.5-0.9 % ophthalmic solution Place 1 drop into both eyes 2 (two) times daily as  needed for dry eyes.   cephALEXin (KEFLEX) 250 MG capsule Take 250 mg by mouth daily.   Cyanocobalamin (VITAMIN B 12 PO) Take 1 tablet by mouth daily.   estradiol (ESTRACE) 0.1 MG/GM vaginal cream Place 1 Applicatorful vaginally 2 (two) times a week.   gabapentin (NEURONTIN) 100 MG capsule Take 200 mg by mouth 2 (two) times daily.   levothyroxine (SYNTHROID) 100 MCG tablet Take 100 mcg by mouth daily.   losartan (COZAAR) 50 MG tablet Take 50 mg by mouth 2 (two) times daily.   meloxicam (MOBIC) 15 MG tablet Take 15 mg by mouth daily as needed for pain.   metoprolol succinate (TOPROL-XL) 25 MG 24 hr tablet TAKE 1/2 TABLET BY MOUTH EVERY DAY (Patient taking differently: Take 12.5 mg by mouth daily.)   mirtazapine (REMERON) 7.5 MG tablet Take 7.5 mg by mouth at bedtime.   Multiple Vitamin (MULTIVITAMIN) capsule Take 1 capsule by mouth daily.   OLANZapine (ZYPREXA) 2.5 MG tablet Take 2.5 mg by mouth at bedtime.   Omega-3 Fatty Acids (FISH OIL PO) Take 1 tablet by mouth daily.   ondansetron (ZOFRAN-ODT) 4 MG disintegrating tablet Take 1 tablet (4 mg total) by mouth every 8 (eight) hours as needed for nausea or vomiting.   polyethylene glycol (MIRALAX / GLYCOLAX) packet Take 17 g by mouth daily as needed for mild constipation.   polyethylene glycol (MIRALAX) 17 g packet Take 17 g by mouth daily.   saccharomyces boulardii (FLORASTOR) 250 MG capsule Take 250 mg by mouth daily.   traZODone (DESYREL) 50 MG tablet Take 50 mg by mouth at bedtime as needed.   VITAMIN D, CHOLECALCIFEROL, PO Take 1 capsule by mouth daily.   No facility-administered encounter medications on file as of 09/21/2023.    Review of Systems  Constitutional:  Negative for activity change and appetite change.  Respiratory:  Negative for cough.   Cardiovascular:  Negative for chest pain.  Gastrointestinal:  Negative for abdominal distention and abdominal pain.  Genitourinary:  Positive for dysuria. Negative for frequency and  hematuria.  Psychiatric/Behavioral:  Positive for confusion, dysphoric mood and sleep disturbance. The patient is nervous/anxious.     Immunization History  Administered Date(s) Administered   Influenza, High Dose Seasonal PF 08/04/2017   PFIZER Comirnaty(Gray Top)Covid-19 Tri-Sucrose Vaccine 04/22/2021   PFIZER(Purple Top)SARS-COV-2 Vaccination 11/17/2019, 12/06/2019   Pfizer Covid-19 Vaccine Bivalent Booster 93yrs & up 08/27/2021   Tdap 02/15/2022   Zoster Recombinant(Shingrix) 01/18/2018, 04/20/2018   Pertinent  Health Maintenance Due  Topic Date Due   DEXA SCAN  Never done   INFLUENZA VACCINE  06/02/2023      09/21/2020    3:34 PM 09/26/2020    3:57 PM 08/07/2021    1:56 PM 02/15/2022    2:38 PM 03/17/2023    3:02 PM  Fall Risk  Falls in the past  year?     0  Was there an injury with Fall?     0  Fall Risk Category Calculator     0  (RETIRED) Patient Fall Risk Level Low fall risk High fall risk Low fall risk Moderate fall risk   Patient at Risk for Falls Due to     No Fall Risks  Fall risk Follow up     Falls evaluation completed   Functional Status Survey:    Vitals:   09/21/23 1700  BP: 122/66  Pulse: 71  Resp: 18  Temp: (!) 96.7 F (35.9 C)  SpO2: 95%  Weight: 128 lb 12.8 oz (58.4 kg)  Height: 5\' 4"  (1.626 m)   Body mass index is 22.11 kg/m. Physical Exam Vitals reviewed.  Constitutional:      General: She is not in acute distress. HENT:     Head: Normocephalic.  Eyes:     General:        Right eye: No discharge.        Left eye: No discharge.  Cardiovascular:     Rate and Rhythm: Normal rate and regular rhythm.     Pulses: Normal pulses.     Heart sounds: Normal heart sounds.  Pulmonary:     Effort: Pulmonary effort is normal.     Breath sounds: Normal breath sounds.  Musculoskeletal:     Cervical back: Neck supple.  Skin:    General: Skin is warm.     Capillary Refill: Capillary refill takes less than 2 seconds.  Neurological:     General:  No focal deficit present.     Mental Status: She is alert.  Psychiatric:        Mood and Affect: Mood is anxious.     Labs reviewed: Recent Labs    06/13/23 0403 06/14/23 0357 06/15/23 0340 08/07/23 1245 09/08/23 1532 09/10/23 1823  NA 128* 131* 133* 132* 133* 130*  K 4.0 4.0 3.8 4.0 4.0 4.3  CL 99 100 102 99 98 96*  CO2 21* 25 25 26 24 25   GLUCOSE 92 92 95 107* 104* 91  BUN 14 12 11 10 8 10   CREATININE 1.36* 0.93 0.73 0.97 0.94 1.02*  CALCIUM 9.2 8.8* 8.7* 9.2 9.7 10.3  MG 2.3 2.0 2.0  --   --  1.9  PHOS 3.9 3.3 3.6  --   --   --    Recent Labs    06/15/23 0340 08/07/23 1245 09/08/23 1532  AST 60* 21 21  ALT 54* 15 18  ALKPHOS 85 60 76  BILITOT 0.8 1.0 0.7  PROT 6.0* 6.6 7.3  ALBUMIN 3.3* 4.0 4.3   Recent Labs    06/14/23 0357 06/15/23 0340 08/07/23 1245 09/08/23 1532 09/10/23 1823  WBC 7.6 5.1 5.6 5.5 5.3  NEUTROABS 4.6 2.6  --  3.8  --   HGB 10.4* 10.4* 11.1* 11.5* 11.9*  HCT 32.2* 32.1* 34.0* 34.4* 34.9*  MCV 93.6 92.8 95.8 94.2 90.9  PLT 185 181 223 239 254   Lab Results  Component Value Date   TSH 1.460 06/11/2023   No results found for: "HGBA1C" No results found for: "CHOL", "HDL", "LDLCALC", "LDLDIRECT", "TRIG", "CHOLHDL"  Significant Diagnostic Results in last 30 days:  CT Head Wo Contrast  Result Date: 09/08/2023 CLINICAL DATA:  Fall, head injury, pain EXAM: CT HEAD WITHOUT CONTRAST CT CERVICAL SPINE WITHOUT CONTRAST TECHNIQUE: Multidetector CT imaging of the head and cervical spine was performed following the standard protocol without  intravenous contrast. Multiplanar CT image reconstructions of the cervical spine were also generated. RADIATION DOSE REDUCTION: This exam was performed according to the departmental dose-optimization program which includes automated exposure control, adjustment of the mA and/or kV according to patient size and/or use of iterative reconstruction technique. COMPARISON:  02/15/2022 FINDINGS: CT HEAD FINDINGS Brain:  No evidence of acute infarction, hemorrhage, hydrocephalus, extra-axial collection or mass lesion/mass effect. Periventricular and deep white matter hypodensity. Mild global cerebral volume loss. Vascular: No hyperdense vessel or unexpected calcification. Skull: Normal. Negative for fracture or focal lesion. Sinuses/Orbits: No acute finding. Other: None. CT CERVICAL SPINE FINDINGS Alignment: Normal. Skull base and vertebrae: No acute fracture. No primary bone lesion or focal pathologic process. Soft tissues and spinal canal: No prevertebral fluid or swelling. No visible canal hematoma. Disc levels: Focally mild disc space height loss and osteophytosis of C5-C6 with otherwise intact disc spaces. Upper chest: Negative. Other: None. IMPRESSION: 1. No acute intracranial pathology. 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 2. No fracture or static subluxation of the cervical spine. 3. Focally mild disc space height loss and osteophytosis of C5-C6 with otherwise intact disc spaces. Electronically Signed   By: Jearld Lesch M.D.   On: 09/08/2023 13:53   CT Cervical Spine Wo Contrast  Result Date: 09/08/2023 CLINICAL DATA:  Fall, head injury, pain EXAM: CT HEAD WITHOUT CONTRAST CT CERVICAL SPINE WITHOUT CONTRAST TECHNIQUE: Multidetector CT imaging of the head and cervical spine was performed following the standard protocol without intravenous contrast. Multiplanar CT image reconstructions of the cervical spine were also generated. RADIATION DOSE REDUCTION: This exam was performed according to the departmental dose-optimization program which includes automated exposure control, adjustment of the mA and/or kV according to patient size and/or use of iterative reconstruction technique. COMPARISON:  02/15/2022 FINDINGS: CT HEAD FINDINGS Brain: No evidence of acute infarction, hemorrhage, hydrocephalus, extra-axial collection or mass lesion/mass effect. Periventricular and deep white matter  hypodensity. Mild global cerebral volume loss. Vascular: No hyperdense vessel or unexpected calcification. Skull: Normal. Negative for fracture or focal lesion. Sinuses/Orbits: No acute finding. Other: None. CT CERVICAL SPINE FINDINGS Alignment: Normal. Skull base and vertebrae: No acute fracture. No primary bone lesion or focal pathologic process. Soft tissues and spinal canal: No prevertebral fluid or swelling. No visible canal hematoma. Disc levels: Focally mild disc space height loss and osteophytosis of C5-C6 with otherwise intact disc spaces. Upper chest: Negative. Other: None. IMPRESSION: 1. No acute intracranial pathology. 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 2. No fracture or static subluxation of the cervical spine. 3. Focally mild disc space height loss and osteophytosis of C5-C6 with otherwise intact disc spaces. Electronically Signed   By: Jearld Lesch M.D.   On: 09/08/2023 13:53    Assessment/Plan 1. 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 Logan Bores - increased dysuria today - Trimethoprim was discontinued last week per Dr. Chales Abrahams - she is on Keflex  - ? Increased anxiety/impaired cognition as cause - UA/culture - encourage hydration with fluids  2. Depression, major, recurrent, severe with psychosis (HCC) - h/o major depression with psychosis - started on Zyprexa and Remeron last week - no changes with mood - will increase Zyprexa to 5 mg today  3. Anxiety - ongoing - ? Cognitive involvement - plan for MMSE in a few weeks when behaviors/sleep improves - cont xanax prn  4. Chronic insomnia - intermittent sleeping per nursing  -  ambien discontinued last week - on Trazodone 50 mg and Remeron     Family/ staff Communication: plan discussed with patient and nurse  Labs/tests ordered:  UA/culture

## 2023-09-21 NOTE — Telephone Encounter (Signed)
Patient's son Konica Mcharg) called to say his mom's urologist discontinued the Keflex and put her on trimethoprim for her history of UTI's. He just wanted you to be aware.

## 2023-09-22 ENCOUNTER — Non-Acute Institutional Stay: Payer: Medicare PPO | Admitting: Internal Medicine

## 2023-09-22 DIAGNOSIS — I10 Essential (primary) hypertension: Secondary | ICD-10-CM | POA: Diagnosis not present

## 2023-09-22 DIAGNOSIS — F5104 Psychophysiologic insomnia: Secondary | ICD-10-CM | POA: Diagnosis not present

## 2023-09-22 DIAGNOSIS — N1831 Chronic kidney disease, stage 3a: Secondary | ICD-10-CM | POA: Diagnosis not present

## 2023-09-22 DIAGNOSIS — K5909 Other constipation: Secondary | ICD-10-CM

## 2023-09-22 DIAGNOSIS — E785 Hyperlipidemia, unspecified: Secondary | ICD-10-CM | POA: Diagnosis not present

## 2023-09-22 DIAGNOSIS — R2681 Unsteadiness on feet: Secondary | ICD-10-CM | POA: Diagnosis not present

## 2023-09-22 DIAGNOSIS — M6281 Muscle weakness (generalized): Secondary | ICD-10-CM | POA: Diagnosis not present

## 2023-09-22 DIAGNOSIS — E0590 Thyrotoxicosis, unspecified without thyrotoxic crisis or storm: Secondary | ICD-10-CM | POA: Diagnosis not present

## 2023-09-22 DIAGNOSIS — F419 Anxiety disorder, unspecified: Secondary | ICD-10-CM | POA: Diagnosis not present

## 2023-09-22 DIAGNOSIS — F333 Major depressive disorder, recurrent, severe with psychotic symptoms: Secondary | ICD-10-CM | POA: Diagnosis not present

## 2023-09-22 DIAGNOSIS — N390 Urinary tract infection, site not specified: Secondary | ICD-10-CM

## 2023-09-22 DIAGNOSIS — R278 Other lack of coordination: Secondary | ICD-10-CM | POA: Diagnosis not present

## 2023-09-22 NOTE — Progress Notes (Signed)
Location: [facility] of Service:  ALF ([id number])  Provider:   Code Status: DNR Goals of Care:     09/10/2023    5:51 PM  Advanced Directives  Does Patient Have a Medical Advance Directive? No  Would patient like information on creating a medical advance directive? No - Patient declined     Chief Complaint  Patient presents with   Acute Visit    HPI: Patient is a 82 y.o. female seen today for an acute visit for her Depression and Vaginal Pain  Patient continues to have issues with Vaginal Pain  UA and Culture is pending Now on higher dose of Zyprexa She is eating well now. Does ask for Xanax which is PRN Staying in her room most of the time in her Pajamas Refuses to change or Come out She was pleasant to me   Per Dr Elon Spanner last note she is suppose to be on Trimethoprim not Keflex as she has had ESBL in the past He also suggested that Her pain is not due to Infection  Previous note Patient is New admit to AL in [facility] Patient was living with her husband in [place] Husband is recently admitted in the hospital after a fall. Her son who is a Pensions consultant in [place] and a [place] is here with her.  And she also has a daughter in [place]   Patient with number medical issues Acute depression Patient has had issues with depression for many years.  Per son she was admitted in [place] few years ago.  Was stabilized and discharged.  But recently in past few months there has been some confusion with her medications.  Not sure if she is taking it properly It seems like she has also seen  with behavioral health.Debbie Wells She had tried her on different medications. Presently patient is on Xanax, BuSpar, Cymbalta, trazodone, Ambien Per son  he did found a lot of meds in her apartment Her husband is a pharmacist who has been changing some of her medications   Recent weight loss This seems to be at chronic issue also. Has seen Dr. Leone Payor in the past.  She had  a CT done which had not shown anything acute.  The weight loss has thought to be due to her anxiety and depression.  Also has issues with chronic constipation   Insomnia on high doses of trazodone and Ambien History of neuropathy has seen Dr. Vickey Huger Recurrent UTIs Have seen urology Dr. Logan Bores, Dr. Marlou Porch in [facility] urology, has seen infectious disease.Last Note by Dr Logan Bores suggested Intestinal Cystitis       Past Medical History:  Diagnosis Date   Adenomatous colon polyp    Adenomatous polyp of colon 12/03/2004   6mm   Anxiety    Chronic headaches    Chronic insomnia    Depression    Diverticulosis    External hemorrhoids    Hyperlipidemia    Hypertension    Hyperthyroidism    Hypothyroidism    Umbilical hernia     Past Surgical History:  Procedure Laterality Date   ABDOMINAL HYSTERECTOMY  1975   APPENDECTOMY  1975   COLONOSCOPY  03/03/2010,12/03/2004   ESOPHAGOGASTRODUODENOSCOPY  12/16/11   Dr. Stan Head   OOPHORECTOMY     spinal lipoma removed  2006   TOTAL ABDOMINAL HYSTERECTOMY      Allergies  Allergen Reactions   Hydrocodone-Acetaminophen Rash    Vicodin=REACTION: rash   Iodinated Contrast Media Hives  and Rash    Per patient she has developed hives and itching in upper trunk of body after last 2 CT scans once she got homes that she has just treated at home with benadryl. She has not told anyone until now. States she was able to bear it  Other reaction(s): Rash Per patient she has developed hives and itching in upper trunk of body after last 2 CT scans once she got homes that she has just treated at home with benadryl. She has not told anyone until now. States she was able to bear it    Sulfa Antibiotics Rash    REACTION: rash Other reaction(s): Unknown   Codeine     Other reaction(s): Unknown   Sulfonamide Derivatives     REACTION: rash   Terbinafine     Other reaction(s): Unknown   Benazepril Cough    Other reaction(s): Cough    Outpatient Encounter  Medications as of 09/22/2023  Medication Sig   ALPRAZolam (XANAX) 0.5 MG tablet Take 1 tablet by mouth 3 (three) times daily as needed.   amLODipine (NORVASC) 2.5 MG tablet Take 2.5 mg by mouth daily.   busPIRone (BUSPAR) 15 MG tablet Take 15 mg by mouth 2 (two) times daily.   carboxymethylcellul-glycerin (REFRESH OPTIVE) 0.5-0.9 % ophthalmic solution Place 1 drop into both eyes 2 (two) times daily as needed for dry eyes.   cephALEXin (KEFLEX) 250 MG capsule Take 250 mg by mouth daily.   Cyanocobalamin (VITAMIN B 12 PO) Take 1 tablet by mouth daily.   estradiol (ESTRACE) 0.1 MG/GM vaginal cream Place 1 Applicatorful vaginally 2 (two) times a week.   gabapentin (NEURONTIN) 100 MG capsule Take 200 mg by mouth 2 (two) times daily.   levothyroxine (SYNTHROID) 100 MCG tablet Take 100 mcg by mouth daily.   losartan (COZAAR) 50 MG tablet Take 50 mg by mouth 2 (two) times daily.   meloxicam (MOBIC) 15 MG tablet Take 15 mg by mouth daily as needed for pain.   metoprolol succinate (TOPROL-XL) 25 MG 24 hr tablet TAKE 1/2 TABLET BY MOUTH EVERY DAY (Patient taking differently: Take 12.5 mg by mouth daily.)   mirtazapine (REMERON) 7.5 MG tablet Take 7.5 mg by mouth at bedtime.   Multiple Vitamin (MULTIVITAMIN) capsule Take 1 capsule by mouth daily.   OLANZapine (ZYPREXA) 2.5 MG tablet Take 5 mg by mouth at bedtime.   Omega-3 Fatty Acids (FISH OIL PO) Take 1 tablet by mouth daily.   ondansetron (ZOFRAN-ODT) 4 MG disintegrating tablet Take 1 tablet (4 mg total) by mouth every 8 (eight) hours as needed for nausea or vomiting.   polyethylene glycol (MIRALAX / GLYCOLAX) packet Take 17 g by mouth daily as needed for mild constipation.   polyethylene glycol (MIRALAX) 17 g packet Take 17 g by mouth daily.   saccharomyces boulardii (FLORASTOR) 250 MG capsule Take 250 mg by mouth daily.   traZODone (DESYREL) 50 MG tablet Take 50 mg by mouth at bedtime as needed.   VITAMIN D, CHOLECALCIFEROL, PO Take 1 capsule by  mouth daily.   No facility-administered encounter medications on file as of 09/22/2023.    Review of Systems:  Review of Systems  Constitutional:  Positive for activity change. Negative for appetite change.  HENT: Negative.    Respiratory:  Negative for cough and shortness of breath.   Cardiovascular:  Negative for leg swelling.  Gastrointestinal:  Negative for constipation.  Genitourinary:  Positive for difficulty urinating, dysuria, frequency, pelvic pain and urgency.  Musculoskeletal:  Negative for arthralgias, gait problem and myalgias.  Skin: Negative.   Neurological:  Positive for weakness. Negative for dizziness.  Psychiatric/Behavioral:  Positive for confusion and dysphoric mood. Negative for sleep disturbance. The patient is nervous/anxious.     Health Maintenance  Topic Date Due   Medicare Annual Wellness (AWV)  Never done   Pneumonia Vaccine 12+ Years old (1 of 1 - PCV) Never done   DEXA SCAN  Never done   INFLUENZA VACCINE  06/02/2023   COVID-19 Vaccine (5 - 2023-24 season) 07/03/2023   DTaP/Tdap/Td (2 - Td or Tdap) 02/16/2032   Zoster Vaccines- Shingrix  Completed   HPV VACCINES  Aged Out   Hepatitis C Screening  Discontinued    Physical Exam: There were no vitals filed for this visit. There is no height or weight on file to calculate BMI. Physical Exam Vitals reviewed.  Constitutional:      Appearance: Normal appearance.  HENT:     Head: Normocephalic.     Nose: Nose normal.     Mouth/Throat:     Mouth: Mucous membranes are moist.     Pharynx: Oropharynx is clear.  Eyes:     Pupils: Pupils are equal, round, and reactive to light.  Cardiovascular:     Rate and Rhythm: Normal rate and regular rhythm.     Pulses: Normal pulses.     Heart sounds: Normal heart sounds. No murmur heard. Pulmonary:     Effort: Pulmonary effort is normal.     Breath sounds: Normal breath sounds.  Abdominal:     General: Abdomen is flat. Bowel sounds are normal.      Palpations: Abdomen is soft.  Musculoskeletal:        General: No swelling.     Cervical back: Neck supple.  Skin:    General: Skin is warm.  Neurological:     General: No focal deficit present.     Mental Status: She is alert and oriented to person, place, and time.  Psychiatric:        Mood and Affect: Mood normal.        Thought Content: Thought content normal.     Labs reviewed: Basic Metabolic Panel: Recent Labs    06/11/23 1643 06/11/23 2119 06/13/23 0403 06/14/23 0357 06/15/23 0340 08/07/23 1245 09/08/23 1532 09/10/23 1823  NA  --    < > 128* 131* 133* 132* 133* 130*  K  --    < > 4.0 4.0 3.8 4.0 4.0 4.3  CL  --    < > 99 100 102 99 98 96*  CO2  --    < > 21* 25 25 26 24 25   GLUCOSE  --    < > 92 92 95 107* 104* 91  BUN  --    < > 14 12 11 10 8 10   CREATININE  --    < > 1.36* 0.93 0.73 0.97 0.94 1.02*  CALCIUM  --    < > 9.2 8.8* 8.7* 9.2 9.7 10.3  MG  --    < > 2.3 2.0 2.0  --   --  1.9  PHOS  --   --  3.9 3.3 3.6  --   --   --   TSH 1.460  --   --   --   --   --   --   --    < > = values in this interval not displayed.   Liver Function Tests: Recent Labs  06/15/23 0340 08/07/23 1245 09/08/23 1532  AST 60* 21 21  ALT 54* 15 18  ALKPHOS 85 60 76  BILITOT 0.8 1.0 0.7  PROT 6.0* 6.6 7.3  ALBUMIN 3.3* 4.0 4.3   Recent Labs    08/07/23 1245  LIPASE 36   No results for input(s): "AMMONIA" in the last 8760 hours. CBC: Recent Labs    06/14/23 0357 06/15/23 0340 08/07/23 1245 09/08/23 1532 09/10/23 1823  WBC 7.6 5.1 5.6 5.5 5.3  NEUTROABS 4.6 2.6  --  3.8  --   HGB 10.4* 10.4* 11.1* 11.5* 11.9*  HCT 32.2* 32.1* 34.0* 34.4* 34.9*  MCV 93.6 92.8 95.8 94.2 90.9  PLT 185 181 223 239 254   Lipid Panel: No results for input(s): "CHOL", "HDL", "LDLCALC", "TRIG", "CHOLHDL", "LDLDIRECT" in the last 8760 hours. No results found for: "HGBA1C"  Procedures since last visit: CT Head Wo Contrast  Result Date: 09/08/2023 CLINICAL DATA:  Fall, head  injury, pain EXAM: CT HEAD WITHOUT CONTRAST CT CERVICAL SPINE WITHOUT CONTRAST TECHNIQUE: Multidetector CT imaging of the head and cervical spine was performed following the standard protocol without intravenous contrast. Multiplanar CT image reconstructions of the cervical spine were also generated. RADIATION DOSE REDUCTION: This exam was performed according to the departmental dose-optimization program which includes automated exposure control, adjustment of the mA and/or kV according to patient size and/or use of iterative reconstruction technique. COMPARISON:  02/15/2022 FINDINGS: CT HEAD FINDINGS Brain: No evidence of acute infarction, hemorrhage, hydrocephalus, extra-axial collection or mass lesion/mass effect. Periventricular and deep white matter hypodensity. Mild global cerebral volume loss. Vascular: No hyperdense vessel or unexpected calcification. Skull: Normal. Negative for fracture or focal lesion. Sinuses/Orbits: No acute finding. Other: None. CT CERVICAL SPINE FINDINGS Alignment: Normal. Skull base and vertebrae: No acute fracture. No primary bone lesion or focal pathologic process. Soft tissues and spinal canal: No prevertebral fluid or swelling. No visible canal hematoma. Disc levels: Focally mild disc space height loss and osteophytosis of C5-C6 with otherwise intact disc spaces. Upper chest: Negative. Other: None. IMPRESSION: 1. No acute intracranial pathology. 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 2. No fracture or static subluxation of the cervical spine. 3. Focally mild disc space height loss and osteophytosis of C5-C6 with otherwise intact disc spaces. Electronically Signed   By: Jearld Lesch M.D.   On: 09/08/2023 13:53   CT Cervical Spine Wo Contrast  Result Date: 09/08/2023 CLINICAL DATA:  Fall, head injury, pain EXAM: CT HEAD WITHOUT CONTRAST CT CERVICAL SPINE WITHOUT CONTRAST TECHNIQUE: Multidetector CT imaging of the head and cervical spine was performed  following the standard protocol without intravenous contrast. Multiplanar CT image reconstructions of the cervical spine were also generated. RADIATION DOSE REDUCTION: This exam was performed according to the departmental dose-optimization program which includes automated exposure control, adjustment of the mA and/or kV according to patient size and/or use of iterative reconstruction technique. COMPARISON:  02/15/2022 FINDINGS: CT HEAD FINDINGS Brain: No evidence of acute infarction, hemorrhage, hydrocephalus, extra-axial collection or mass lesion/mass effect. Periventricular and deep white matter hypodensity. Mild global cerebral volume loss. Vascular: No hyperdense vessel or unexpected calcification. Skull: Normal. Negative for fracture or focal lesion. Sinuses/Orbits: No acute finding. Other: None. CT CERVICAL SPINE FINDINGS Alignment: Normal. Skull base and vertebrae: No acute fracture. No primary bone lesion or focal pathologic process. Soft tissues and spinal canal: No prevertebral fluid or swelling. No visible canal hematoma. Disc levels: Focally mild disc space height loss and osteophytosis of C5-C6 with  otherwise intact disc spaces. Upper chest: Negative. Other: None. IMPRESSION: 1. No acute intracranial pathology. 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 2. No fracture or static subluxation of the cervical spine. 3. Focally mild disc space height loss and osteophytosis of C5-C6 with otherwise intact disc spaces. Electronically Signed   By: Jearld Lesch M.D.   On: 09/08/2023 13:53    Assessment/Plan 1. Recurrent urinary tract infection Discontinue Cephalexin and start Trimethoprim again as Per Dr Elon Spanner last note she has h/o ESBL UTI Repeat UA and Culture pending  2. Depression, major, recurrent, severe with psychosis (HCC) Zyprexa,Remeron and Cymbalta Buspar and Xanax  3. Chronic insomnia Remeron and Trazodone   Labs/tests ordered:  * No order type specified  * Next appt:  Visit date not found

## 2023-09-23 ENCOUNTER — Encounter: Payer: Self-pay | Admitting: Orthopedic Surgery

## 2023-09-23 ENCOUNTER — Encounter: Payer: Self-pay | Admitting: Internal Medicine

## 2023-09-23 ENCOUNTER — Non-Acute Institutional Stay: Payer: Medicare PPO | Admitting: Orthopedic Surgery

## 2023-09-23 DIAGNOSIS — F419 Anxiety disorder, unspecified: Secondary | ICD-10-CM | POA: Diagnosis not present

## 2023-09-23 DIAGNOSIS — M6281 Muscle weakness (generalized): Secondary | ICD-10-CM | POA: Diagnosis not present

## 2023-09-23 DIAGNOSIS — F339 Major depressive disorder, recurrent, unspecified: Secondary | ICD-10-CM | POA: Diagnosis not present

## 2023-09-23 DIAGNOSIS — N390 Urinary tract infection, site not specified: Secondary | ICD-10-CM

## 2023-09-23 DIAGNOSIS — E871 Hypo-osmolality and hyponatremia: Secondary | ICD-10-CM

## 2023-09-23 DIAGNOSIS — R41841 Cognitive communication deficit: Secondary | ICD-10-CM | POA: Diagnosis not present

## 2023-09-23 DIAGNOSIS — R1319 Other dysphagia: Secondary | ICD-10-CM | POA: Diagnosis not present

## 2023-09-23 DIAGNOSIS — R278 Other lack of coordination: Secondary | ICD-10-CM | POA: Diagnosis not present

## 2023-09-23 LAB — CBC AND DIFFERENTIAL
HCT: 33 — AB (ref 36–46)
Hemoglobin: 11.1 — AB (ref 12.0–16.0)
Neutrophils Absolute: 3058
Platelets: 223 10*3/uL (ref 150–400)
WBC: 5.3

## 2023-09-23 LAB — HEPATIC FUNCTION PANEL
ALT: 14 U/L (ref 7–35)
AST: 19 (ref 13–35)
Alkaline Phosphatase: 76 (ref 25–125)
Bilirubin, Total: 0.5

## 2023-09-23 LAB — COMPREHENSIVE METABOLIC PANEL
Albumin: 3.7 (ref 3.5–5.0)
Calcium: 9.1 (ref 8.7–10.7)
Globulin: 2

## 2023-09-23 LAB — BASIC METABOLIC PANEL
BUN: 15 (ref 4–21)
CO2: 27 — AB (ref 13–22)
Chloride: 92 — AB (ref 99–108)
Creatinine: 1 (ref 0.5–1.1)
Glucose: 92
Potassium: 4.5 meq/L (ref 3.5–5.1)
Sodium: 125 — AB (ref 137–147)

## 2023-09-23 LAB — TSH: TSH: 0.77 (ref 0.41–5.90)

## 2023-09-23 LAB — LIPID PANEL
Cholesterol: 191 (ref 0–200)
HDL: 56 (ref 35–70)
LDL Cholesterol: 112
Triglycerides: 118 (ref 40–160)

## 2023-09-23 LAB — CBC: RBC: 3.52 — AB (ref 3.87–5.11)

## 2023-09-23 NOTE — Progress Notes (Signed)
Location:  [facility] Room Number: 35-A Place of Service:  ALF [phone]) Provider:  Octavia Heir, NP   Mahlon Gammon, MD  Patient Care Team: Mahlon Gammon, MD as PCP - General (Internal Medicine) Lewayne Bunting, MD as PCP - Cardiology (Cardiology) Duke, Roe Rutherford, PA as Physician Assistant (Cardiology)  Extended Emergency Contact Information Primary Emergency Contact: Toy Care Address: [address]          [place], [place] [address] [place] of [place] Home Phone: [phone] Mobile Phone: [phone] Relation: Spouse Secondary Emergency Contact: Egnor,William Mobile Phone: [phone] Relation: Son  Code Status:  DNR Goals of care: Advanced Directive information    09/10/2023    5:51 PM  Advanced Directives  Does Patient Have a Medical Advance Directive? No  Would patient like information on creating a medical advance directive? No - Patient declined     Chief Complaint  Patient presents with   Acute Visit    Hyponatremia    HPI:  Pt is a 82 y.o. female seen today for acute visit due to hyponatremia.   She recently moved to AL at [facility] 09/12/2023. PMH: HTN, HLD, migraines, IBS, thyroid disease, neuropathy, CKD, recurrent UTI, major depression, anxiety and insomnia.   Recent Na+ 125. H/o hyponatremia in past. She denies dizziness, headaches, or nausea. No change in behaviors. She is drinking fluids well.   H/o recurrent UTI. Followed by Dr. Marlou Porch. Trimethoprim restarted, Keflex discontinued yesterday. She is still having some dysuria. UA/culture pending.     Past Medical History:  Diagnosis Date   Adenomatous colon polyp    Adenomatous polyp of colon 12/03/2004   6mm   Anxiety    Chronic headaches    Chronic insomnia    Depression    Diverticulosis    External hemorrhoids    Hyperlipidemia    Hypertension    Hyperthyroidism    Hypothyroidism    Umbilical hernia    Past Surgical History:   Procedure Laterality Date   ABDOMINAL HYSTERECTOMY  1975   APPENDECTOMY  1975   COLONOSCOPY  03/03/2010,12/03/2004   ESOPHAGOGASTRODUODENOSCOPY  12/16/11   Dr. Stan Head   OOPHORECTOMY     spinal lipoma removed  2006   TOTAL ABDOMINAL HYSTERECTOMY      Allergies  Allergen Reactions   Hydrocodone-Acetaminophen Rash    Vicodin=REACTION: rash   Iodinated Contrast Media Hives and Rash    Per patient she has developed hives and itching in upper trunk of body after last 2 CT scans once she got homes that she has just treated at home with benadryl. She has not told anyone until now. States she was able to bear it  Other reaction(s): Rash Per patient she has developed hives and itching in upper trunk of body after last 2 CT scans once she got homes that she has just treated at home with benadryl. She has not told anyone until now. States she was able to bear it    Sulfa Antibiotics Rash    REACTION: rash Other reaction(s): Unknown   Codeine     Other reaction(s): Unknown   Sulfonamide Derivatives     REACTION: rash   Terbinafine     Other reaction(s): Unknown   Benazepril Cough    Other reaction(s): Cough    Outpatient Encounter Medications as of 09/23/2023  Medication Sig   acetaminophen (TYLENOL) 325 MG tablet Take 650 mg by mouth every 4 (four) hours as needed.  ALPRAZolam (XANAX) 0.5 MG tablet Take 1 tablet by mouth 3 (three) times daily as needed.   amLODipine (NORVASC) 2.5 MG tablet Take 2.5 mg by mouth daily.   busPIRone (BUSPAR) 15 MG tablet Take 15 mg by mouth 2 (two) times daily.   Docusate Sodium 100 MG capsule Take 100 mg by mouth as needed for constipation.   DULoxetine (CYMBALTA) 60 MG capsule Take 60 mg by mouth daily.   estradiol (ESTRACE) 0.1 MG/GM vaginal cream Place 1 Applicatorful vaginally 2 (two) times a week.   gabapentin (NEURONTIN) 100 MG capsule Take 200 mg by mouth 2 (two) times daily.   GABAPENTIN PO Take 200 mg by mouth in the morning and at bedtime.    levothyroxine (SYNTHROID) 100 MCG tablet Take 100 mcg by mouth daily.   losartan (COZAAR) 50 MG tablet Take 50 mg by mouth 2 (two) times daily.   meloxicam (MOBIC) 15 MG tablet Take 15 mg by mouth daily as needed for pain.   metoprolol succinate (TOPROL-XL) 12.5 mg TB24 24 hr tablet Take 12.5 mg by mouth daily.   mirtazapine (REMERON) 7.5 MG tablet Take 7.5 mg by mouth at bedtime.   ondansetron (ZOFRAN) 8 MG tablet Take 8 mg by mouth as needed for nausea or vomiting.   polyethylene glycol (MIRALAX) 17 g packet Take 17 g by mouth daily.   saccharomyces boulardii (FLORASTOR) 250 MG capsule Take 250 mg by mouth daily.   senna (SENOKOT) 8.6 MG TABS tablet Take 1 tablet by mouth daily.   SENNA PO Take 2 tablets by mouth as needed.   sodium chloride 1 g tablet Take 1 g by mouth 3 (three) times daily.   traZODone (DESYREL) 50 MG tablet Take 50 mg by mouth at bedtime as needed.   trimethoprim (TRIMPEX) 100 MG tablet Take 100 mg by mouth daily.   carboxymethylcellul-glycerin (REFRESH OPTIVE) 0.5-0.9 % ophthalmic solution Place 1 drop into both eyes 2 (two) times daily as needed for dry eyes.   Cyanocobalamin (VITAMIN B 12 PO) Take 1 tablet by mouth daily.   Multiple Vitamin (MULTIVITAMIN) capsule Take 1 capsule by mouth daily.   Omega-3 Fatty Acids (FISH OIL PO) Take 1 tablet by mouth daily.   VITAMIN D, CHOLECALCIFEROL, PO Take 1 capsule by mouth daily.   [DISCONTINUED] Magnesium Hydroxide (MILK OF MAGNESIA PO) Take 30 mLs by mouth as needed.   [DISCONTINUED] metoprolol succinate (TOPROL-XL) 25 MG 24 hr tablet TAKE 1/2 TABLET BY MOUTH EVERY DAY (Patient taking differently: Take 12.5 mg by mouth daily.)   [DISCONTINUED] OLANZapine (ZYPREXA) 2.5 MG tablet Take 5 mg by mouth at bedtime.   [DISCONTINUED] ondansetron (ZOFRAN-ODT) 4 MG disintegrating tablet Take 1 tablet (4 mg total) by mouth every 8 (eight) hours as needed for nausea or vomiting.   [DISCONTINUED] polyethylene glycol (MIRALAX / GLYCOLAX)  packet Take 17 g by mouth daily as needed for mild constipation.   No facility-administered encounter medications on file as of 09/23/2023.    Review of Systems  Constitutional:  Negative for activity change and appetite change.  Respiratory:  Negative for cough and shortness of breath.   Cardiovascular:  Negative for chest pain and leg swelling.  Gastrointestinal:  Negative for nausea and vomiting.  Genitourinary:  Positive for dysuria and frequency. Negative for hematuria and vaginal discharge.  Neurological:  Negative for dizziness and headaches.  Psychiatric/Behavioral:  Negative for confusion and dysphoric mood. The patient is not nervous/anxious.     Immunization History  Administered Date(s) Administered  Influenza, High Dose Seasonal PF 08/04/2017   PFIZER Comirnaty(Gray Top)Covid-19 Tri-Sucrose Vaccine 04/22/2021   PFIZER(Purple Top)SARS-COV-2 Vaccination 11/17/2019, 12/06/2019   Pfizer Covid-19 Vaccine Bivalent Booster 48yrs & up 08/27/2021   Tdap 02/15/2022   Zoster Recombinant(Shingrix) 01/18/2018, 04/20/2018   Pertinent  Health Maintenance Due  Topic Date Due   DEXA SCAN  Never done   INFLUENZA VACCINE  06/02/2023      09/21/2020    3:34 PM 09/26/2020    3:57 PM 08/07/2021    1:56 PM 02/15/2022    2:38 PM 03/17/2023    3:02 PM  Fall Risk  Falls in the past year?     0  Was there an injury with Fall?     0  Fall Risk Category Calculator     0  (RETIRED) Patient Fall Risk Level Low fall risk High fall risk Low fall risk Moderate fall risk   Patient at Risk for Falls Due to     No Fall Risks  Fall risk Follow up     Falls evaluation completed   Functional Status Survey:    Vitals:   09/23/23 1509 09/23/23 1521  BP: (!) 152/71 (!) 148/72  Pulse: 71   Resp: 18   Temp: (!) 96.7 F (35.9 C)   SpO2: 95%   Weight: 128 lb 12.8 oz (58.4 kg)   Height: 5\' 4"  (1.626 m)    Body mass index is 22.11 kg/m. Physical Exam Vitals reviewed.  Constitutional:       General: She is not in acute distress. HENT:     Head: Normocephalic.  Eyes:     General:        Right eye: No discharge.        Left eye: No discharge.  Cardiovascular:     Rate and Rhythm: Normal rate and regular rhythm.     Pulses: Normal pulses.     Heart sounds: Normal heart sounds.  Pulmonary:     Effort: Pulmonary effort is normal.     Breath sounds: Normal breath sounds.  Musculoskeletal:     Cervical back: Neck supple.  Skin:    General: Skin is warm.     Capillary Refill: Capillary refill takes less than 2 seconds.  Neurological:     General: No focal deficit present.     Mental Status: She is alert.  Psychiatric:        Mood and Affect: Mood normal.     Labs reviewed: Recent Labs    06/13/23 0403 06/14/23 0357 06/15/23 0340 08/07/23 1245 09/08/23 1532 09/10/23 1823  NA 128* 131* 133* 132* 133* 130*  K 4.0 4.0 3.8 4.0 4.0 4.3  CL 99 100 102 99 98 96*  CO2 21* 25 25 26 24 25   GLUCOSE 92 92 95 107* 104* 91  BUN 14 12 11 10 8 10   CREATININE 1.36* 0.93 0.73 0.97 0.94 1.02*  CALCIUM 9.2 8.8* 8.7* 9.2 9.7 10.3  MG 2.3 2.0 2.0  --   --  1.9  PHOS 3.9 3.3 3.6  --   --   --    Recent Labs    06/15/23 0340 08/07/23 1245 09/08/23 1532  AST 60* 21 21  ALT 54* 15 18  ALKPHOS 85 60 76  BILITOT 0.8 1.0 0.7  PROT 6.0* 6.6 7.3  ALBUMIN 3.3* 4.0 4.3   Recent Labs    06/14/23 0357 06/15/23 0340 08/07/23 1245 09/08/23 1532 09/10/23 1823  WBC 7.6 5.1 5.6 5.5 5.3  NEUTROABS 4.6 2.6  --  3.8  --   HGB 10.4* 10.4* 11.1* 11.5* 11.9*  HCT 32.2* 32.1* 34.0* 34.4* 34.9*  MCV 93.6 92.8 95.8 94.2 90.9  PLT 185 181 223 239 254   Lab Results  Component Value Date   TSH 1.460 06/11/2023   No results found for: "HGBA1C" No results found for: "CHOL", "HDL", "LDLCALC", "LDLDIRECT", "TRIG", "CHOLHDL"  Significant Diagnostic Results in last 30 days:  CT Head Wo Contrast  Result Date: 09/08/2023 CLINICAL DATA:  Fall, head injury, pain EXAM: CT HEAD WITHOUT  CONTRAST CT CERVICAL SPINE WITHOUT CONTRAST TECHNIQUE: Multidetector CT imaging of the head and cervical spine was performed following the standard protocol without intravenous contrast. Multiplanar CT image reconstructions of the cervical spine were also generated. RADIATION DOSE REDUCTION: This exam was performed according to the departmental dose-optimization program which includes automated exposure control, adjustment of the mA and/or kV according to patient size and/or use of iterative reconstruction technique. COMPARISON:  02/15/2022 FINDINGS: CT HEAD FINDINGS Brain: No evidence of acute infarction, hemorrhage, hydrocephalus, extra-axial collection or mass lesion/mass effect. Periventricular and deep white matter hypodensity. Mild global cerebral volume loss. Vascular: No hyperdense vessel or unexpected calcification. Skull: Normal. Negative for fracture or focal lesion. Sinuses/Orbits: No acute finding. Other: None. CT CERVICAL SPINE FINDINGS Alignment: Normal. Skull base and vertebrae: No acute fracture. No primary bone lesion or focal pathologic process. Soft tissues and spinal canal: No prevertebral fluid or swelling. No visible canal hematoma. Disc levels: Focally mild disc space height loss and osteophytosis of C5-C6 with otherwise intact disc spaces. Upper chest: Negative. Other: None. IMPRESSION: 1. No acute intracranial pathology. 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 2. No fracture or static subluxation of the cervical spine. 3. Focally mild disc space height loss and osteophytosis of C5-C6 with otherwise intact disc spaces. Electronically Signed   By: Jearld Lesch M.D.   On: 09/08/2023 13:53   CT Cervical Spine Wo Contrast  Result Date: 09/08/2023 CLINICAL DATA:  Fall, head injury, pain EXAM: CT HEAD WITHOUT CONTRAST CT CERVICAL SPINE WITHOUT CONTRAST TECHNIQUE: Multidetector CT imaging of the head and cervical spine was performed following the standard protocol  without intravenous contrast. Multiplanar CT image reconstructions of the cervical spine were also generated. RADIATION DOSE REDUCTION: This exam was performed according to the departmental dose-optimization program which includes automated exposure control, adjustment of the mA and/or kV according to patient size and/or use of iterative reconstruction technique. COMPARISON:  02/15/2022 FINDINGS: CT HEAD FINDINGS Brain: No evidence of acute infarction, hemorrhage, hydrocephalus, extra-axial collection or mass lesion/mass effect. Periventricular and deep white matter hypodensity. Mild global cerebral volume loss. Vascular: No hyperdense vessel or unexpected calcification. Skull: Normal. Negative for fracture or focal lesion. Sinuses/Orbits: No acute finding. Other: None. CT CERVICAL SPINE FINDINGS Alignment: Normal. Skull base and vertebrae: No acute fracture. No primary bone lesion or focal pathologic process. Soft tissues and spinal canal: No prevertebral fluid or swelling. No visible canal hematoma. Disc levels: Focally mild disc space height loss and osteophytosis of C5-C6 with otherwise intact disc spaces. Upper chest: Negative. Other: None. IMPRESSION: 1. No acute intracranial pathology. 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 2. No fracture or static subluxation of the cervical spine. 3. Focally mild disc space height loss and osteophytosis of C5-C6 with otherwise intact disc spaces. Electronically Signed   By: Jearld Lesch M.D.   On: 09/08/2023 13:53    Assessment/Plan 1. Hyponatremia - Na+ 125 -  h/o hyponatremia - start sodium 1g tablet po TID - recheck bmp 11/25 - will add 1 gatorade daily x 3 days - send to ED if N/V, headaches, dizziness or weakness   2. Recurrent urinary tract infection - ongoing - followed by urology - 11/21 Trimethoprim restarted, Keflex discontinued - still having dysuria today - UA/culture> pending     Family/ staff Communication: plan  discussed with patient and nurse  Labs/tests ordered:  bmp 11/25

## 2023-09-26 ENCOUNTER — Emergency Department (HOSPITAL_COMMUNITY): Payer: Medicare PPO

## 2023-09-26 ENCOUNTER — Inpatient Hospital Stay (HOSPITAL_COMMUNITY): Payer: Medicare PPO

## 2023-09-26 ENCOUNTER — Inpatient Hospital Stay (HOSPITAL_COMMUNITY)
Admission: EM | Admit: 2023-09-26 | Discharge: 2023-10-04 | DRG: 522 | Disposition: A | Discharge status: Skilled Nursing Facility | Payer: Medicare PPO | Source: Skilled Nursing Facility | Attending: Internal Medicine | Admitting: Internal Medicine

## 2023-09-26 ENCOUNTER — Encounter (HOSPITAL_COMMUNITY): Payer: Self-pay | Admitting: Emergency Medicine

## 2023-09-26 ENCOUNTER — Other Ambulatory Visit: Payer: Self-pay

## 2023-09-26 DIAGNOSIS — D1779 Benign lipomatous neoplasm of other sites: Secondary | ICD-10-CM | POA: Diagnosis present

## 2023-09-26 DIAGNOSIS — I10 Essential (primary) hypertension: Secondary | ICD-10-CM | POA: Diagnosis not present

## 2023-09-26 DIAGNOSIS — Z87891 Personal history of nicotine dependence: Secondary | ICD-10-CM

## 2023-09-26 DIAGNOSIS — E039 Hypothyroidism, unspecified: Secondary | ICD-10-CM | POA: Diagnosis not present

## 2023-09-26 DIAGNOSIS — Z888 Allergy status to other drugs, medicaments and biological substances status: Secondary | ICD-10-CM

## 2023-09-26 DIAGNOSIS — N179 Acute kidney failure, unspecified: Secondary | ICD-10-CM | POA: Diagnosis not present

## 2023-09-26 DIAGNOSIS — E871 Hypo-osmolality and hyponatremia: Secondary | ICD-10-CM | POA: Diagnosis present

## 2023-09-26 DIAGNOSIS — Z860101 Personal history of adenomatous and serrated colon polyps: Secondary | ICD-10-CM

## 2023-09-26 DIAGNOSIS — G4700 Insomnia, unspecified: Secondary | ICD-10-CM | POA: Diagnosis present

## 2023-09-26 DIAGNOSIS — Z79899 Other long term (current) drug therapy: Secondary | ICD-10-CM | POA: Diagnosis not present

## 2023-09-26 DIAGNOSIS — N1831 Chronic kidney disease, stage 3a: Secondary | ICD-10-CM | POA: Diagnosis not present

## 2023-09-26 DIAGNOSIS — N952 Postmenopausal atrophic vaginitis: Secondary | ICD-10-CM | POA: Diagnosis not present

## 2023-09-26 DIAGNOSIS — F03A3 Unspecified dementia, mild, with mood disturbance: Secondary | ICD-10-CM | POA: Diagnosis present

## 2023-09-26 DIAGNOSIS — F03A18 Unspecified dementia, mild, with other behavioral disturbance: Secondary | ICD-10-CM | POA: Diagnosis present

## 2023-09-26 DIAGNOSIS — Z87440 Personal history of urinary (tract) infections: Secondary | ICD-10-CM | POA: Diagnosis not present

## 2023-09-26 DIAGNOSIS — Z0189 Encounter for other specified special examinations: Secondary | ICD-10-CM | POA: Diagnosis not present

## 2023-09-26 DIAGNOSIS — R000 Tachycardia, unspecified: Secondary | ICD-10-CM | POA: Diagnosis not present

## 2023-09-26 DIAGNOSIS — S72001A Fracture of unspecified part of neck of right femur, initial encounter for closed fracture: Principal | ICD-10-CM | POA: Diagnosis present

## 2023-09-26 DIAGNOSIS — E440 Moderate protein-calorie malnutrition: Secondary | ICD-10-CM | POA: Diagnosis not present

## 2023-09-26 DIAGNOSIS — F339 Major depressive disorder, recurrent, unspecified: Secondary | ICD-10-CM | POA: Diagnosis not present

## 2023-09-26 DIAGNOSIS — M6281 Muscle weakness (generalized): Secondary | ICD-10-CM | POA: Diagnosis not present

## 2023-09-26 DIAGNOSIS — Z882 Allergy status to sulfonamides status: Secondary | ICD-10-CM

## 2023-09-26 DIAGNOSIS — E785 Hyperlipidemia, unspecified: Secondary | ICD-10-CM | POA: Diagnosis present

## 2023-09-26 DIAGNOSIS — Z90710 Acquired absence of both cervix and uterus: Secondary | ICD-10-CM

## 2023-09-26 DIAGNOSIS — Z800 Family history of malignant neoplasm of digestive organs: Secondary | ICD-10-CM

## 2023-09-26 DIAGNOSIS — M25572 Pain in left ankle and joints of left foot: Secondary | ICD-10-CM | POA: Diagnosis not present

## 2023-09-26 DIAGNOSIS — Z01818 Encounter for other preprocedural examination: Secondary | ICD-10-CM | POA: Diagnosis not present

## 2023-09-26 DIAGNOSIS — F332 Major depressive disorder, recurrent severe without psychotic features: Secondary | ICD-10-CM | POA: Diagnosis not present

## 2023-09-26 DIAGNOSIS — F03A4 Unspecified dementia, mild, with anxiety: Secondary | ICD-10-CM | POA: Diagnosis not present

## 2023-09-26 DIAGNOSIS — W010XXA Fall on same level from slipping, tripping and stumbling without subsequent striking against object, initial encounter: Secondary | ICD-10-CM | POA: Diagnosis present

## 2023-09-26 DIAGNOSIS — Z791 Long term (current) use of non-steroidal anti-inflammatories (NSAID): Secondary | ICD-10-CM | POA: Diagnosis not present

## 2023-09-26 DIAGNOSIS — W19XXXA Unspecified fall, initial encounter: Secondary | ICD-10-CM | POA: Diagnosis not present

## 2023-09-26 DIAGNOSIS — Y92129 Unspecified place in nursing home as the place of occurrence of the external cause: Secondary | ICD-10-CM | POA: Diagnosis not present

## 2023-09-26 DIAGNOSIS — R339 Retention of urine, unspecified: Secondary | ICD-10-CM | POA: Diagnosis not present

## 2023-09-26 DIAGNOSIS — N1330 Unspecified hydronephrosis: Secondary | ICD-10-CM | POA: Diagnosis not present

## 2023-09-26 DIAGNOSIS — Z8249 Family history of ischemic heart disease and other diseases of the circulatory system: Secondary | ICD-10-CM

## 2023-09-26 DIAGNOSIS — Z885 Allergy status to narcotic agent status: Secondary | ICD-10-CM

## 2023-09-26 DIAGNOSIS — M80051A Age-related osteoporosis with current pathological fracture, right femur, initial encounter for fracture: Principal | ICD-10-CM | POA: Diagnosis present

## 2023-09-26 DIAGNOSIS — Z471 Aftercare following joint replacement surgery: Secondary | ICD-10-CM | POA: Diagnosis not present

## 2023-09-26 DIAGNOSIS — D62 Acute posthemorrhagic anemia: Secondary | ICD-10-CM | POA: Diagnosis not present

## 2023-09-26 DIAGNOSIS — G8918 Other acute postprocedural pain: Secondary | ICD-10-CM | POA: Diagnosis not present

## 2023-09-26 DIAGNOSIS — K5909 Other constipation: Secondary | ICD-10-CM | POA: Diagnosis not present

## 2023-09-26 DIAGNOSIS — Z79890 Hormone replacement therapy (postmenopausal): Secondary | ICD-10-CM

## 2023-09-26 DIAGNOSIS — R296 Repeated falls: Secondary | ICD-10-CM | POA: Diagnosis present

## 2023-09-26 DIAGNOSIS — R41841 Cognitive communication deficit: Secondary | ICD-10-CM | POA: Diagnosis not present

## 2023-09-26 DIAGNOSIS — I129 Hypertensive chronic kidney disease with stage 1 through stage 4 chronic kidney disease, or unspecified chronic kidney disease: Secondary | ICD-10-CM | POA: Diagnosis present

## 2023-09-26 DIAGNOSIS — F419 Anxiety disorder, unspecified: Secondary | ICD-10-CM | POA: Diagnosis not present

## 2023-09-26 DIAGNOSIS — Z841 Family history of disorders of kidney and ureter: Secondary | ICD-10-CM

## 2023-09-26 DIAGNOSIS — S72011A Unspecified intracapsular fracture of right femur, initial encounter for closed fracture: Secondary | ICD-10-CM | POA: Diagnosis not present

## 2023-09-26 DIAGNOSIS — M25551 Pain in right hip: Secondary | ICD-10-CM | POA: Diagnosis not present

## 2023-09-26 DIAGNOSIS — R2689 Other abnormalities of gait and mobility: Secondary | ICD-10-CM | POA: Diagnosis not present

## 2023-09-26 DIAGNOSIS — G609 Hereditary and idiopathic neuropathy, unspecified: Secondary | ICD-10-CM | POA: Diagnosis not present

## 2023-09-26 DIAGNOSIS — G9009 Other idiopathic peripheral autonomic neuropathy: Secondary | ICD-10-CM | POA: Diagnosis not present

## 2023-09-26 DIAGNOSIS — Z6822 Body mass index (BMI) 22.0-22.9, adult: Secondary | ICD-10-CM

## 2023-09-26 DIAGNOSIS — S72001D Fracture of unspecified part of neck of right femur, subsequent encounter for closed fracture with routine healing: Secondary | ICD-10-CM | POA: Diagnosis not present

## 2023-09-26 DIAGNOSIS — S79101A Unspecified physeal fracture of lower end of right femur, initial encounter for closed fracture: Secondary | ICD-10-CM | POA: Diagnosis not present

## 2023-09-26 DIAGNOSIS — Z91041 Radiographic dye allergy status: Secondary | ICD-10-CM

## 2023-09-26 DIAGNOSIS — R278 Other lack of coordination: Secondary | ICD-10-CM | POA: Diagnosis not present

## 2023-09-26 DIAGNOSIS — R1319 Other dysphagia: Secondary | ICD-10-CM | POA: Diagnosis not present

## 2023-09-26 DIAGNOSIS — G4701 Insomnia due to medical condition: Secondary | ICD-10-CM | POA: Diagnosis not present

## 2023-09-26 DIAGNOSIS — Z96641 Presence of right artificial hip joint: Secondary | ICD-10-CM | POA: Diagnosis not present

## 2023-09-26 DIAGNOSIS — Z823 Family history of stroke: Secondary | ICD-10-CM

## 2023-09-26 DIAGNOSIS — R2681 Unsteadiness on feet: Secondary | ICD-10-CM | POA: Diagnosis not present

## 2023-09-26 LAB — COMPREHENSIVE METABOLIC PANEL
ALT: 23 U/L (ref 0–44)
AST: 29 U/L (ref 15–41)
Albumin: 3.6 g/dL (ref 3.5–5.0)
Alkaline Phosphatase: 82 U/L (ref 38–126)
Anion gap: 6 (ref 5–15)
BUN: 8 mg/dL (ref 8–23)
CO2: 24 mmol/L (ref 22–32)
Calcium: 9.4 mg/dL (ref 8.9–10.3)
Chloride: 103 mmol/L (ref 98–111)
Creatinine, Ser: 1.07 mg/dL — ABNORMAL HIGH (ref 0.44–1.00)
GFR, Estimated: 52 mL/min — ABNORMAL LOW (ref >60)
Glucose, Bld: 133 mg/dL — ABNORMAL HIGH (ref 70–99)
Potassium: 3.7 mmol/L (ref 3.5–5.1)
Sodium: 133 mmol/L — ABNORMAL LOW (ref 135–145)
Total Bilirubin: 0.7 mg/dL (ref <1.2)
Total Protein: 6.6 g/dL (ref 6.5–8.1)

## 2023-09-26 LAB — CBC WITH DIFFERENTIAL/PLATELET
Abs Immature Granulocytes: 0.06 10*3/uL (ref 0.00–0.07)
Basophils Absolute: 0.1 10*3/uL (ref 0.0–0.1)
Basophils Relative: 1 %
Eosinophils Absolute: 0.2 10*3/uL (ref 0.0–0.5)
Eosinophils Relative: 3 %
HCT: 36.3 % (ref 36.0–46.0)
Hemoglobin: 11.8 g/dL — ABNORMAL LOW (ref 12.0–15.0)
Immature Granulocytes: 1 %
Lymphocytes Relative: 18 %
Lymphs Abs: 1 10*3/uL (ref 0.7–4.0)
MCH: 31.2 pg (ref 26.0–34.0)
MCHC: 32.5 g/dL (ref 30.0–36.0)
MCV: 96 fL (ref 80.0–100.0)
Monocytes Absolute: 0.5 10*3/uL (ref 0.1–1.0)
Monocytes Relative: 10 %
Neutro Abs: 3.5 10*3/uL (ref 1.7–7.7)
Neutrophils Relative %: 67 %
Platelets: 216 10*3/uL (ref 150–400)
RBC: 3.78 MIL/uL — ABNORMAL LOW (ref 3.87–5.11)
RDW: 12.3 % (ref 11.5–15.5)
WBC: 5.2 10*3/uL (ref 4.0–10.5)
nRBC: 0 % (ref 0.0–0.2)

## 2023-09-26 MED ORDER — SODIUM CHLORIDE 0.9 % IV SOLN: INTRAVENOUS | Status: AC

## 2023-09-26 MED ORDER — AMLODIPINE BESYLATE 2.5 MG PO TABS
2.5000 mg | ORAL_TABLET | Freq: Every day | ORAL | Status: DC
  Administered 2023-09-28 – 2023-10-04 (×7): 2.5 mg via ORAL
  Filled 2023-09-26 (×6): qty 1

## 2023-09-26 MED ORDER — MORPHINE SULFATE (PF) 4 MG/ML IV SOLN
4.0000 mg | Freq: Once | INTRAVENOUS | Status: AC
  Administered 2023-09-26: 4 mg via INTRAVENOUS
  Filled 2023-09-26: qty 1

## 2023-09-26 MED ORDER — BUSPIRONE HCL 5 MG PO TABS
15.0000 mg | ORAL_TABLET | Freq: Two times a day (BID) | ORAL | Status: DC
  Administered 2023-09-27 – 2023-10-04 (×15): 15 mg via ORAL
  Filled 2023-09-26 (×17): qty 1

## 2023-09-26 MED ORDER — METHOCARBAMOL 500 MG PO TABS
500.0000 mg | ORAL_TABLET | Freq: Four times a day (QID) | ORAL | Status: DC | PRN
  Administered 2023-09-27 – 2023-10-02 (×5): 500 mg via ORAL
  Filled 2023-09-26 (×6): qty 1

## 2023-09-26 MED ORDER — OXYCODONE HCL 5 MG PO TABS
5.0000 mg | ORAL_TABLET | ORAL | Status: DC | PRN
  Administered 2023-09-27: 5 mg via ORAL
  Filled 2023-09-26: qty 1

## 2023-09-26 MED ORDER — HYDRALAZINE HCL 25 MG PO TABS
25.0000 mg | ORAL_TABLET | Freq: Four times a day (QID) | ORAL | Status: DC | PRN
  Administered 2023-09-27: 25 mg via ORAL
  Filled 2023-09-26: qty 1

## 2023-09-26 MED ORDER — OLANZAPINE 10 MG PO TABS
5.0000 mg | ORAL_TABLET | Freq: Every day | ORAL | Status: DC
  Administered 2023-09-28 – 2023-10-03 (×6): 5 mg via ORAL
  Filled 2023-09-26 (×8): qty 1

## 2023-09-26 MED ORDER — GABAPENTIN 100 MG PO CAPS
200.0000 mg | ORAL_CAPSULE | Freq: Two times a day (BID) | ORAL | Status: DC
  Administered 2023-09-27 – 2023-10-04 (×15): 200 mg via ORAL
  Filled 2023-09-26 (×16): qty 2

## 2023-09-26 MED ORDER — ALPRAZOLAM 0.5 MG PO TABS
0.5000 mg | ORAL_TABLET | Freq: Three times a day (TID) | ORAL | Status: DC | PRN
  Administered 2023-09-27 – 2023-10-04 (×9): 0.5 mg via ORAL
  Filled 2023-09-26 (×11): qty 1

## 2023-09-26 MED ORDER — TRAZODONE HCL 50 MG PO TABS
50.0000 mg | ORAL_TABLET | Freq: Every evening | ORAL | Status: DC | PRN
  Administered 2023-09-27 – 2023-10-03 (×4): 50 mg via ORAL
  Filled 2023-09-26 (×6): qty 1

## 2023-09-26 MED ORDER — FENTANYL CITRATE PF 50 MCG/ML IJ SOSY
12.5000 ug | PREFILLED_SYRINGE | INTRAMUSCULAR | Status: DC | PRN
Start: 2023-09-26 — End: 2023-09-28
  Administered 2023-09-26 – 2023-09-28 (×5): 50 ug via INTRAVENOUS
  Filled 2023-09-26 (×5): qty 1

## 2023-09-26 MED ORDER — ONDANSETRON HCL 4 MG/2ML IJ SOLN: 4.0000 mg | Freq: Four times a day (QID) | INTRAMUSCULAR | Status: DC | PRN

## 2023-09-26 MED ORDER — TRIMETHOPRIM 100 MG PO TABS
100.0000 mg | ORAL_TABLET | Freq: Every day | ORAL | Status: DC
  Administered 2023-09-28 – 2023-09-30 (×3): 100 mg via ORAL
  Filled 2023-09-26 (×4): qty 1

## 2023-09-26 MED ORDER — ONDANSETRON HCL 4 MG/2ML IJ SOLN
4.0000 mg | Freq: Once | INTRAMUSCULAR | Status: AC
  Administered 2023-09-26: 4 mg via INTRAVENOUS
  Filled 2023-09-26: qty 2

## 2023-09-26 MED ORDER — MIRTAZAPINE 15 MG PO TABS
7.5000 mg | ORAL_TABLET | Freq: Every day | ORAL | Status: DC
  Administered 2023-09-27 – 2023-10-03 (×7): 7.5 mg via ORAL
  Filled 2023-09-26 (×8): qty 1

## 2023-09-26 MED ORDER — POLYETHYLENE GLYCOL 3350 17 G PO PACK
17.0000 g | PACK | Freq: Every day | ORAL | Status: DC | PRN
Start: 2023-09-26 — End: 2023-10-04

## 2023-09-26 MED ORDER — LEVOTHYROXINE SODIUM 100 MCG PO TABS
100.0000 ug | ORAL_TABLET | Freq: Every day | ORAL | Status: DC
  Administered 2023-09-27 – 2023-10-04 (×8): 100 ug via ORAL
  Filled 2023-09-26 (×9): qty 1

## 2023-09-26 MED ORDER — SODIUM CHLORIDE 1 G PO TABS
1.0000 g | ORAL_TABLET | Freq: Three times a day (TID) | ORAL | Status: DC
  Administered 2023-09-27: 1 g via ORAL
  Filled 2023-09-26 (×3): qty 1

## 2023-09-26 MED ORDER — DULOXETINE HCL 60 MG PO CPEP
60.0000 mg | ORAL_CAPSULE | Freq: Every day | ORAL | Status: DC
  Administered 2023-09-28 – 2023-10-04 (×7): 60 mg via ORAL
  Filled 2023-09-26 (×8): qty 1

## 2023-09-26 MED ORDER — METHOCARBAMOL 1000 MG/10ML IJ SOLN: 500.0000 mg | Freq: Four times a day (QID) | INTRAMUSCULAR | Status: DC | PRN

## 2023-09-26 MED ORDER — SENNA 8.6 MG PO TABS
1.0000 | ORAL_TABLET | Freq: Every day | ORAL | Status: DC
  Administered 2023-09-28 – 2023-10-01 (×4): 8.6 mg via ORAL
  Filled 2023-09-26 (×5): qty 1

## 2023-09-26 MED ORDER — METOPROLOL SUCCINATE ER 25 MG PO TB24
12.5000 mg | ORAL_TABLET | Freq: Every day | ORAL | Status: DC
  Administered 2023-09-28 – 2023-10-04 (×7): 12.5 mg via ORAL
  Filled 2023-09-26 (×8): qty 1

## 2023-09-26 NOTE — Progress Notes (Signed)
Orthopedic Tech Progress Note Patient Details:  Debbie Wells [birth year] [id number]  Patient ID: [name], female   DOB: 1941-10-07, 81 y.o.   MRN: [id number] Patient doesn't meet criteria for ohf. Patient must be under 70 to get ohf. Trinna Post 09/26/2023, 11:21 PM

## 2023-09-26 NOTE — ED Notes (Signed)
Update given to pt's son over the phone. He was able to successfully verify pt's name and DOB. Requesting a call back when results are back.

## 2023-09-26 NOTE — ED Notes (Signed)
Pt reports she is still in severe pain and the meds are not working.

## 2023-09-26 NOTE — ED Notes (Signed)
Patient transported to X-ray 

## 2023-09-26 NOTE — Progress Notes (Signed)
Consult request received for displaced right femoral neck fracture. Have discussed with the requesting MD patient's stability, pain control, and ongoing work up. I have reviewed x-rays and developed a provisional plan.   Full consultation to follow in the am. Patient will need either hemiarthroplasty or total hip arthroplasty. NPO after MN. X-rays of rest of the right femur. Surgery tomorrow pm if schedule permits.  Myrene Galas, MD [facility], [facility] [phone]

## 2023-09-26 NOTE — H&P (Signed)
History and Physical    NUSAYBAH SEASTRAND ZOX:[id number] DOB: 1941/08/15 DOA: 09/26/2023  PCP: Mahlon Gammon, MD   Patient coming from: ALF  Chief Complaint: Fall, right hip pain   HPI: Debbie Wells is a 82 y.o. female with medical history significant for depression, anxiety, insomnia, hypothyroidism, and hypertension who presents with severe right hip pain after a fall.   Patient was in her usual state having an uneventful day when she was carrying a tray, tripped, and fell to the ground.  She has been experiencing severe right hip pain ever since.  She denies loss of consciousness or any other appreciable injuries.  She states that she typically uses a walker but was trying to carry something with both hands when she fell.  She denies history of heart disease and never experiences chest discomfort with activity.  ED Course: Upon arrival to the ED, patient is found to be afebrile and saturating well on room air with normal heart rate and elevated blood pressure.  Labs are most notable for sodium 133 and creatinine 1.07.  Plain radiographs demonstrate acute impacted subcapital right femoral neck fracture.  Orthopedic surgery (Dr. Carola Frost) was consulted by the ED physician and the patient was treated with morphine and Zofran.  Review of Systems:  All other systems reviewed and apart from HPI, are negative.  Past Medical History:  Diagnosis Date   Adenomatous colon polyp    Adenomatous polyp of colon 12/03/2004   6mm   Anxiety    Chronic headaches    Chronic insomnia    Depression    Diverticulosis    External hemorrhoids    Hyperlipidemia    Hypertension    Hyperthyroidism    Hypothyroidism    Umbilical hernia     Past Surgical History:  Procedure Laterality Date   ABDOMINAL HYSTERECTOMY  1975   APPENDECTOMY  1975   COLONOSCOPY  03/03/2010,12/03/2004   ESOPHAGOGASTRODUODENOSCOPY  12/16/11   Dr. Stan Head   OOPHORECTOMY     spinal lipoma removed  2006   TOTAL ABDOMINAL  HYSTERECTOMY      Social History:   reports that she quit smoking about 30 years ago. Her smoking use included cigarettes. She has never used smokeless tobacco. She reports current alcohol use of about 2.0 standard drinks of alcohol per week. She reports that she does not use drugs.  Allergies  Allergen Reactions   Hydrocodone-Acetaminophen Rash    Vicodin=REACTION: rash   Iodinated Contrast Media Hives and Rash    Per patient she has developed hives and itching in upper trunk of body after last 2 CT scans once she got homes that she has just treated at home with benadryl. She has not told anyone until now. States she was able to bear it  Other reaction(s): Rash Per patient she has developed hives and itching in upper trunk of body after last 2 CT scans once she got homes that she has just treated at home with benadryl. She has not told anyone until now. States she was able to bear it    Sulfa Antibiotics Rash    REACTION: rash Other reaction(s): Unknown   Codeine     Other reaction(s): Unknown   Sulfonamide Derivatives     REACTION: rash   Terbinafine     Other reaction(s): Unknown   Benazepril Cough    Other reaction(s): Cough    Family History  Problem Relation Age of Onset   Esophageal cancer Father    Stomach  cancer Father        mets from esophagus   Heart disease Mother    Irritable bowel syndrome Mother    Kidney disease Mother    Other Mother        cushings disease   Addison's disease Mother    Heart failure Mother    Stroke Mother    Hypertension Mother    Stroke Maternal Grandmother    Hypertension Maternal Grandmother    Colon cancer Neg Hx    Heart attack Neg Hx      Prior to Admission medications   Medication Sig Start Date End Date Taking? Authorizing Provider  acetaminophen (TYLENOL) 325 MG tablet Take 650 mg by mouth every 4 (four) hours as needed.    [provider]  ALPRAZolam Prudy Feeler) 0.5 MG tablet Take 1 tablet by mouth 3 (three) times  daily as needed. 10/22/20   [provider]  amLODipine (NORVASC) 2.5 MG tablet Take 2.5 mg by mouth daily.    [provider]  busPIRone (BUSPAR) 15 MG tablet Take 15 mg by mouth 2 (two) times daily. 12/03/20   [provider]  Docusate Sodium 100 MG capsule Take 100 mg by mouth as needed for constipation.    [provider]  DULoxetine (CYMBALTA) 60 MG capsule Take 60 mg by mouth daily.    [provider]  estradiol (ESTRACE) 0.1 MG/GM vaginal cream Place 1 Applicatorful vaginally 2 (two) times a week. 02/28/23   [provider]  gabapentin (NEURONTIN) 100 MG capsule Take 200 mg by mouth 2 (two) times daily. 10/22/20   [provider]  GABAPENTIN PO Take 200 mg by mouth in the morning and at bedtime.    [provider]  levothyroxine (SYNTHROID) 100 MCG tablet Take 100 mcg by mouth daily. 06/18/22   [provider]  losartan (COZAAR) 50 MG tablet Take 50 mg by mouth 2 (two) times daily. 09/04/20   [provider]  meloxicam (MOBIC) 15 MG tablet Take 15 mg by mouth daily as needed for pain.    [provider]  metoprolol succinate (TOPROL-XL) 12.5 mg TB24 24 hr tablet Take 12.5 mg by mouth daily.    [provider]  mirtazapine (REMERON) 7.5 MG tablet Take 7.5 mg by mouth at bedtime. 09/20/23   [provider]  OLANZapine (ZYPREXA) 5 MG tablet Take 5 mg by mouth daily.    [provider]  ondansetron (ZOFRAN) 8 MG tablet Take 8 mg by mouth as needed for nausea or vomiting.    [provider]  polyethylene glycol (MIRALAX) 17 g packet Take 17 g by mouth daily. 09/08/23   Coral Spikes, DO  saccharomyces boulardii (FLORASTOR) 250 MG capsule Take 250 mg by mouth daily.    [provider]  senna (SENOKOT) 8.6 MG TABS tablet Take 1 tablet by mouth daily.    [provider]  SENNA PO Take 2 tablets by mouth as needed.    [provider]  sodium  chloride 1 g tablet Take 1 g by mouth 3 (three) times daily.    [provider]  traZODone (DESYREL) 50 MG tablet Take 50 mg by mouth at bedtime as needed.    [provider]  trimethoprim (TRIMPEX) 100 MG tablet Take 100 mg by mouth daily.    [provider]    Physical Exam: Vitals:   09/26/23 2006 09/26/23 2009 09/26/23 2133 09/26/23 2136  BP:  (!) 185/82 (!) 192/86  Pulse:  92 82 82  Resp:  18 18   Temp:  97.7 F (36.5 C)    TempSrc:  Oral    SpO2:  97% 93% 92%  Weight: 59 kg     Height: 5\' 4"  (1.626 m)       Constitutional: NAD, no pallor or diaphoresis   Eyes: PERTLA, lids and conjunctivae normal ENMT: Mucous membranes are moist. Posterior pharynx clear of any exudate or lesions.   Neck: supple, no masses  Respiratory: no wheezing, no crackles. No accessory muscle use.  Cardiovascular: S1 & S2 heard, regular rate and rhythm. No extremity edema.  Abdomen: No distension, no tenderness, soft. Bowel sounds active.  Musculoskeletal: no clubbing / cyanosis. Right hip tenderness; neurovascularly intact distally.   Skin: no significant rashes, lesions, ulcers. Warm, dry, well-perfused. Neurologic: CN 2-12 grossly intact. Moving all extremities. Alert and oriented.  Psychiatric: Calm. Cooperative.    Labs and Imaging on Admission: I have personally reviewed following labs and imaging studies  CBC: Recent Labs  Lab 09/23/23 0000 09/26/23 2043  WBC 5.3 5.2  NEUTROABS 3,058.00 3.5  HGB 11.1* 11.8*  HCT 33* 36.3  MCV  --  96.0  PLT 223 216   Basic Metabolic Panel: Recent Labs  Lab 09/23/23 0000 09/26/23 2043  NA 125* 133*  K 4.5 3.7  CL 92* 103  CO2 27* 24  GLUCOSE  --  133*  BUN 15 8  CREATININE 1.0 1.07*  CALCIUM 9.1 9.4   GFR: Estimated Creatinine Clearance: 35.6 mL/min (A) (by C-G formula based on SCr of 1.07 mg/dL (H)). Liver Function Tests: Recent Labs  Lab 09/23/23 0000 09/26/23 2043  AST 19 29  ALT 14 23  ALKPHOS 76 82   BILITOT  --  0.7  PROT  --  6.6  ALBUMIN 3.7 3.6   No results for input(s): "LIPASE", "AMYLASE" in the last 168 hours. No results for input(s): "AMMONIA" in the last 168 hours. Coagulation Profile: No results for input(s): "INR", "PROTIME" in the last 168 hours. Cardiac Enzymes: No results for input(s): "CKTOTAL", "CKMB", "CKMBINDEX", "TROPONINI" in the last 168 hours. BNP (last 3 results) No results for input(s): "PROBNP" in the last 8760 hours. HbA1C: No results for input(s): "HGBA1C" in the last 72 hours. CBG: No results for input(s): "GLUCAP" in the last 168 hours. Lipid Profile: No results for input(s): "CHOL", "HDL", "LDLCALC", "TRIG", "CHOLHDL", "LDLDIRECT" in the last 72 hours. Thyroid Function Tests: No results for input(s): "TSH", "T4TOTAL", "FREET4", "T3FREE", "THYROIDAB" in the last 72 hours. Anemia Panel: No results for input(s): "VITAMINB12", "FOLATE", "FERRITIN", "TIBC", "IRON", "RETICCTPCT" in the last 72 hours. Urine analysis:    Component Value Date/Time   COLORURINE COLORLESS (A) 09/10/2023 1948   APPEARANCEUR CLEAR 09/10/2023 1948   APPEARANCEUR Clear 08/23/2019 1158   LABSPEC <1.005 (L) 09/10/2023 1948   PHURINE 6.5 09/10/2023 1948   GLUCOSEU NEGATIVE 09/10/2023 1948   GLUCOSEU NEGATIVE 11/28/2012 1203   HGBUR NEGATIVE 09/10/2023 1948   BILIRUBINUR NEGATIVE 09/10/2023 1948   BILIRUBINUR Negative 08/23/2019 1158   KETONESUR TRACE (A) 09/10/2023 1948   PROTEINUR NEGATIVE 09/10/2023 1948   UROBILINOGEN 0.2 11/28/2012 1203   NITRITE NEGATIVE 09/10/2023 1948   LEUKOCYTESUR NEGATIVE 09/10/2023 1948   Sepsis Labs: @LABRCNTIP (procalcitonin:4,lacticidven:4) )No results found for this or any previous visit (from the past 240 hour(s)).   Radiological Exams on Admission: DG Hip Unilat W or Wo Pelvis 2-3 Views Right  Result Date: 09/26/2023 CLINICAL DATA:  Fall EXAM: DG HIP (WITH  OR WITHOUT PELVIS) 2-3V RIGHT COMPARISON:  None Available. FINDINGS: There is  an acute, impacted subcapital right femoral neck fracture. The femoral head is still seated within the right acetabulum and right hip joint space is preserved. The pelvis and visualized left hip are intact. Sacroiliac and left hip joint spaces are preserved. Soft tissues are unremarkable. IMPRESSION: 1. Acute, impacted subcapital right femoral neck fracture. Electronically Signed   By: Helyn Numbers M.D.   On: 09/26/2023 22:02    EKG: Independently reviewed. Sinus tachycardia, rate 121.   Assessment/Plan   1. Right hip fracture  - Based on the available data, Debbie Wells presents an estimated 0.85% risk of perioperative MI or cardiac arrest; no further preoperative cardiac testing is indicated  - Keep NPO after midnight, continue pain-control and supportive care    2. Hyponatremia  - Chronic, appears stable  - Continue salt tabs    3. Hypertension  - Continue amlodipine and Toprol   4. Depression, anxiety, insomnia  - Continue Buspar, Remeron, Cymbalta, Zyprexa, trazodone, and as-needed Xanax    5. Hypothyroidism  - Synthroid    6. CKD 3A  - Appears close to baseline  - Renally-dose medications    DVT prophylaxis: SCDs  Code Status: Full code perioperatively Level of Care: Level of care: Med-Surg Family Communication: none present  Disposition Plan:  Patient is from: ALF  Anticipated d/c is to: TBD Anticipated d/c date is: 09/30/23  Patient currently: Pending orthopedic surgery consultation and likely operative hip repair  Consults called: Orthopedic surgery Admission status: Inpatient     Briscoe Deutscher, MD [facility]  09/26/2023, 10:46 PM

## 2023-09-26 NOTE — ED Notes (Signed)
Pt returned back to room from xray

## 2023-09-26 NOTE — ED Notes (Signed)
ED TO INPATIENT HANDOFF REPORT  ED Nurse Name and Phone #: Mignon Pine RN, MSN [phone]  S Name/Age/Gender Debbie Wells 82 y.o. female Room/Bed: 010C/010C  Code Status   Code Status: Prior  Home/SNF/Other Nursing Home Patient oriented to: self, place, time, and situation Is this baseline? Yes   Triage Complete: Triage complete  Chief Complaint Closed right hip fracture, initial encounter (HCC) [S72.001A]  Triage Note No notes on file   Allergies Allergies  Allergen Reactions   Hydrocodone-Acetaminophen Rash    Vicodin=REACTION: rash   Iodinated Contrast Media Hives and Rash    Per patient she has developed hives and itching in upper trunk of body after last 2 CT scans once she got homes that she has just treated at home with benadryl. She has not told anyone until now. States she was able to bear it  Other reaction(s): Rash Per patient she has developed hives and itching in upper trunk of body after last 2 CT scans once she got homes that she has just treated at home with benadryl. She has not told anyone until now. States she was able to bear it    Sulfa Antibiotics Rash    REACTION: rash Other reaction(s): Unknown   Codeine     Other reaction(s): Unknown   Sulfonamide Derivatives     REACTION: rash   Terbinafine     Other reaction(s): Unknown   Benazepril Cough    Other reaction(s): Cough    Level of Care/Admitting Diagnosis ED Disposition     ED Disposition  Admit   Condition  --   Comment  Hospital [facility]: [facility] [[id number]]  Level of Care: Med-Surg [[id number]]  May admit patient to [facility] or [facility] if equivalent level of care is available:: No  Covid Evaluation: Asymptomatic - no recent exposure (last 10 days) testing not required  Diagnosis: Closed right hip fracture, initial encounter [facility]) [[id number]]  Admitting Physician: Briscoe Deutscher [[id number]]  Attending Physician: Briscoe Deutscher [[id number]]  Certification:: I certify  this patient will need inpatient services for at least 2 midnights  Expected Medical Readiness: 09/30/2023          B Medical/Surgery History Past Medical History:  Diagnosis Date   Adenomatous colon polyp    Adenomatous polyp of colon 12/03/2004   6mm   Anxiety    Chronic headaches    Chronic insomnia    Depression    Diverticulosis    External hemorrhoids    Hyperlipidemia    Hypertension    Hyperthyroidism    Hypothyroidism    Umbilical hernia    Past Surgical History:  Procedure Laterality Date   ABDOMINAL HYSTERECTOMY  1975   APPENDECTOMY  1975   COLONOSCOPY  03/03/2010,12/03/2004   ESOPHAGOGASTRODUODENOSCOPY  12/16/11   Dr. Stan Head   OOPHORECTOMY     spinal lipoma removed  2006   TOTAL ABDOMINAL HYSTERECTOMY       A IV Location/Drains/Wounds Patient Lines/Drains/Airways Status     Active Line/Drains/Airways     Name Placement date Placement time Site Days   Peripheral IV 09/26/23 20 G Left Antecubital 09/26/23  2022  Antecubital  less than 1            Intake/Output Last 24 hours No intake or output data in the 24 hours ending 09/26/23 2233  Labs/Imaging Results for orders placed or performed during the hospital encounter of 09/26/23 (from the past 48 hour(s))  Comprehensive metabolic panel  Status: Abnormal   Collection Time: 09/26/23  8:43 PM  Result Value Ref Range   Sodium 133 (L) 135 - 145 mmol/L   Potassium 3.7 3.5 - 5.1 mmol/L   Chloride 103 98 - 111 mmol/L   CO2 24 22 - 32 mmol/L   Glucose, Bld 133 (H) 70 - 99 mg/dL    Comment: Glucose reference range applies only to samples taken after fasting for at least 8 hours.   BUN 8 8 - 23 mg/dL   Creatinine, Ser 2.20 (H) 0.44 - 1.00 mg/dL   Calcium 9.4 8.9 - 25.4 mg/dL   Total Protein 6.6 6.5 - 8.1 g/dL   Albumin 3.6 3.5 - 5.0 g/dL   AST 29 15 - 41 U/L   ALT 23 0 - 44 U/L   Alkaline Phosphatase 82 38 - 126 U/L   Total Bilirubin 0.7 <1.2 mg/dL   GFR, Estimated 52 (L) >60 mL/min     Comment: (NOTE) Calculated using the CKD-EPI Creatinine Equation (2021)    Anion gap 6 5 - 15    Comment: Performed at [facility], [address], [place], [place] [address]  CBC with Differential     Status: Abnormal   Collection Time: 09/26/23  8:43 PM  Result Value Ref Range   WBC 5.2 4.0 - 10.5 K/uL   RBC 3.78 (L) 3.87 - 5.11 MIL/uL   Hemoglobin 11.8 (L) 12.0 - 15.0 g/dL   HCT 37.6 28.3 - 15.1 %   MCV 96.0 80.0 - 100.0 fL   MCH 31.2 26.0 - 34.0 pg   MCHC 32.5 30.0 - 36.0 g/dL   RDW 76.1 60.7 - 37.1 %   Platelets 216 150 - 400 K/uL   nRBC 0.0 0.0 - 0.2 %   Neutrophils Relative % 67 %   Neutro Abs 3.5 1.7 - 7.7 K/uL   Lymphocytes Relative 18 %   Lymphs Abs 1.0 0.7 - 4.0 K/uL   Monocytes Relative 10 %   Monocytes Absolute 0.5 0.1 - 1.0 K/uL   Eosinophils Relative 3 %   Eosinophils Absolute 0.2 0.0 - 0.5 K/uL   Basophils Relative 1 %   Basophils Absolute 0.1 0.0 - 0.1 K/uL   Immature Granulocytes 1 %   Abs Immature Granulocytes 0.06 0.00 - 0.07 K/uL    Comment: Performed at [facility], [address], [place], [place] [address]   DG Hip Lucienne Capers or [place] Pelvis 2-3 Views Right  Result Date: 09/26/2023 CLINICAL DATA:  Fall EXAM: DG HIP (WITH OR WITHOUT PELVIS) 2-3V RIGHT COMPARISON:  None Available. FINDINGS: There is an acute, impacted subcapital right femoral neck fracture. The femoral head is still seated within the right acetabulum and right hip joint space is preserved. The pelvis and visualized left hip are intact. Sacroiliac and left hip joint spaces are preserved. Soft tissues are unremarkable. IMPRESSION: 1. Acute, impacted subcapital right femoral neck fracture. Electronically Signed   By: Helyn Numbers M.D.   On: 09/26/2023 22:02    Pending Labs Unresulted Labs (From admission, onward)    None       Vitals/Pain Today's Vitals   09/26/23 2009 09/26/23 2042 09/26/23 2133 09/26/23 2136  BP: (!) 185/82  (!) 192/86   Pulse: 92  82 82  Resp: 18  18    Temp: 97.7 F (36.5 Debbie)     TempSrc: Oral     SpO2: 97%  93% 92%  Weight:      Height:  PainSc:  10-Worst pain ever      Isolation Precautions No active isolations  Medications Medications  morphine (PF) 4 MG/ML injection 4 mg (4 mg Intravenous Given 09/26/23 2022)  ondansetron (ZOFRAN) injection 4 mg (4 mg Intravenous Given 09/26/23 2022)  morphine (PF) 4 MG/ML injection 4 mg (4 mg Intravenous Given 09/26/23 2133)    Mobility non-ambulatory     Focused Assessments   R Recommendations: See Admitting Provider Note  Report given to:   Additional Notes: Normally ambulates with walker. Right hip fx s/p fall. Alert and oriented. Uses bedpan. Continent of bowel and bladder. Given a total of 8 mg IV morphine.

## 2023-09-26 NOTE — ED Notes (Signed)
Placed pt on 2L O2 via n/c after receiving fentanyl. Pt transported to xray at this time and then will admit to room on floor.

## 2023-09-26 NOTE — ED Notes (Signed)
ED Provider at bedside. 

## 2023-09-27 ENCOUNTER — Encounter (HOSPITAL_COMMUNITY)
Admission: EM | Disposition: A | Discharge status: Skilled Nursing Facility | Payer: Self-pay | Source: Skilled Nursing Facility | Attending: Internal Medicine

## 2023-09-27 ENCOUNTER — Other Ambulatory Visit: Payer: Self-pay

## 2023-09-27 ENCOUNTER — Inpatient Hospital Stay (HOSPITAL_COMMUNITY): Payer: Medicare PPO

## 2023-09-27 ENCOUNTER — Inpatient Hospital Stay (HOSPITAL_COMMUNITY): Payer: Medicare PPO | Admitting: Certified Registered Nurse Anesthetist

## 2023-09-27 ENCOUNTER — Inpatient Hospital Stay (HOSPITAL_COMMUNITY): Payer: Medicare PPO | Admitting: Anesthesiology

## 2023-09-27 ENCOUNTER — Encounter (HOSPITAL_COMMUNITY): Payer: Self-pay | Admitting: Family Medicine

## 2023-09-27 DIAGNOSIS — E871 Hypo-osmolality and hyponatremia: Secondary | ICD-10-CM | POA: Diagnosis not present

## 2023-09-27 DIAGNOSIS — G8918 Other acute postprocedural pain: Secondary | ICD-10-CM | POA: Diagnosis not present

## 2023-09-27 DIAGNOSIS — S72001A Fracture of unspecified part of neck of right femur, initial encounter for closed fracture: Secondary | ICD-10-CM

## 2023-09-27 DIAGNOSIS — F419 Anxiety disorder, unspecified: Secondary | ICD-10-CM | POA: Diagnosis not present

## 2023-09-27 DIAGNOSIS — I10 Essential (primary) hypertension: Secondary | ICD-10-CM | POA: Diagnosis not present

## 2023-09-27 HISTORY — PX: HIP ARTHROPLASTY: SHX981

## 2023-09-27 LAB — CBC
HCT: 33 % — ABNORMAL LOW (ref 36.0–46.0)
Hemoglobin: 10.9 g/dL — ABNORMAL LOW (ref 12.0–15.0)
MCH: 30.6 pg (ref 26.0–34.0)
MCHC: 33 g/dL (ref 30.0–36.0)
MCV: 92.7 fL (ref 80.0–100.0)
Platelets: 190 10*3/uL (ref 150–400)
RBC: 3.56 MIL/uL — ABNORMAL LOW (ref 3.87–5.11)
RDW: 12.6 % (ref 11.5–15.5)
WBC: 10.5 10*3/uL (ref 4.0–10.5)
nRBC: 0 % (ref 0.0–0.2)

## 2023-09-27 LAB — BASIC METABOLIC PANEL
Anion gap: 7 (ref 5–15)
BUN: 9 mg/dL (ref 8–23)
CO2: 23 mmol/L (ref 22–32)
Calcium: 9.2 mg/dL (ref 8.9–10.3)
Chloride: 103 mmol/L (ref 98–111)
Creatinine, Ser: 1.41 mg/dL — ABNORMAL HIGH (ref 0.44–1.00)
GFR, Estimated: 37 mL/min — ABNORMAL LOW (ref >60)
Glucose, Bld: 115 mg/dL — ABNORMAL HIGH (ref 70–99)
Potassium: 4.2 mmol/L (ref 3.5–5.1)
Sodium: 133 mmol/L — ABNORMAL LOW (ref 135–145)

## 2023-09-27 LAB — TYPE AND SCREEN
ABO/RH(D): A POS
Antibody Screen: NEGATIVE

## 2023-09-27 LAB — SURGICAL PCR SCREEN
MRSA, PCR: NEGATIVE
Staphylococcus aureus: NEGATIVE

## 2023-09-27 LAB — ABO/RH: ABO/RH(D): A POS

## 2023-09-27 SURGERY — HEMIARTHROPLASTY, HIP, DIRECT ANTERIOR APPROACH, FOR FRACTURE
Anesthesia: General | Site: Hip | Laterality: Right

## 2023-09-27 MED ORDER — CHLORHEXIDINE GLUCONATE 0.12 % MT SOLN
OROMUCOSAL | Status: AC
  Administered 2023-09-27: 15 mL via OROMUCOSAL
  Filled 2023-09-27: qty 15

## 2023-09-27 MED ORDER — CHLORHEXIDINE GLUCONATE 0.12 % MT SOLN
15.0000 mL | OROMUCOSAL | Status: AC
  Filled 2023-09-27: qty 15

## 2023-09-27 MED ORDER — TRANEXAMIC ACID-NACL 1000-0.7 MG/100ML-% IV SOLN
INTRAVENOUS | Status: DC | PRN
  Administered 2023-09-27: 1000 mg via INTRAVENOUS

## 2023-09-27 MED ORDER — ROCURONIUM BROMIDE 10 MG/ML (PF) SYRINGE
PREFILLED_SYRINGE | INTRAVENOUS | Status: DC | PRN
  Administered 2023-09-27: 20 mg via INTRAVENOUS
  Administered 2023-09-27: 40 mg via INTRAVENOUS

## 2023-09-27 MED ORDER — ACETAMINOPHEN 325 MG PO TABS: 325.0000 mg | ORAL_TABLET | Freq: Four times a day (QID) | ORAL | Status: DC | PRN

## 2023-09-27 MED ORDER — TRANEXAMIC ACID-NACL 1000-0.7 MG/100ML-% IV SOLN
INTRAVENOUS | Status: AC
Start: 2023-09-27 — End: ?
  Filled 2023-09-27: qty 100

## 2023-09-27 MED ORDER — PROPOFOL 10 MG/ML IV BOLUS
INTRAVENOUS | Status: DC | PRN
  Administered 2023-09-27: 80 mg via INTRAVENOUS
  Administered 2023-09-27: 100 ug/kg/min via INTRAVENOUS

## 2023-09-27 MED ORDER — 0.9 % SODIUM CHLORIDE (POUR BTL) OPTIME
TOPICAL | Status: DC | PRN
  Administered 2023-09-27: 1000 mL

## 2023-09-27 MED ORDER — SUGAMMADEX SODIUM 200 MG/2ML IV SOLN
INTRAVENOUS | Status: DC | PRN
  Administered 2023-09-27: 300 mg via INTRAVENOUS

## 2023-09-27 MED ORDER — FENTANYL CITRATE (PF) 100 MCG/2ML IJ SOLN: 50.0000 ug | Freq: Once | INTRAMUSCULAR | Status: DC

## 2023-09-27 MED ORDER — MORPHINE SULFATE (PF) 2 MG/ML IV SOLN
0.5000 mg | INTRAVENOUS | Status: DC | PRN
  Administered 2023-09-27 (×2): 1 mg via INTRAVENOUS
  Filled 2023-09-27 (×2): qty 1

## 2023-09-27 MED ORDER — LACTATED RINGERS IV SOLN: INTRAVENOUS | Status: DC | PRN

## 2023-09-27 MED ORDER — LABETALOL HCL 5 MG/ML IV SOLN
5.0000 mg | INTRAVENOUS | Status: DC | PRN
  Administered 2023-09-27: 5 mg via INTRAVENOUS

## 2023-09-27 MED ORDER — TRAMADOL HCL 50 MG PO TABS
50.0000 mg | ORAL_TABLET | Freq: Four times a day (QID) | ORAL | Status: DC | PRN
  Administered 2023-09-30 – 2023-10-04 (×5): 50 mg via ORAL
  Filled 2023-09-27 (×7): qty 1

## 2023-09-27 MED ORDER — OXYCODONE-ACETAMINOPHEN 7.5-325 MG PO TABS
1.0000 | ORAL_TABLET | ORAL | Status: DC | PRN
  Administered 2023-09-27: 1 via ORAL
  Filled 2023-09-27: qty 1

## 2023-09-27 MED ORDER — CEFAZOLIN SODIUM 1 G IJ SOLR
INTRAMUSCULAR | Status: AC
  Filled 2023-09-27: qty 20

## 2023-09-27 MED ORDER — OXYCODONE HCL 5 MG PO TABS: 5.0000 mg | ORAL_TABLET | Freq: Once | ORAL | Status: DC | PRN

## 2023-09-27 MED ORDER — OXYCODONE HCL 5 MG/5ML PO SOLN: 5.0000 mg | Freq: Once | ORAL | Status: DC | PRN

## 2023-09-27 MED ORDER — CLONIDINE HCL (ANALGESIA) 100 MCG/ML EP SOLN
EPIDURAL | Status: DC | PRN
  Administered 2023-09-27: 80 ug

## 2023-09-27 MED ORDER — CEFAZOLIN SODIUM-DEXTROSE 1-4 GM/50ML-% IV SOLN
1.0000 g | Freq: Four times a day (QID) | INTRAVENOUS | Status: AC
Start: 2023-09-27 — End: 2023-09-28
  Administered 2023-09-27 – 2023-09-28 (×2): 1 g via INTRAVENOUS
  Filled 2023-09-27 (×2): qty 50

## 2023-09-27 MED ORDER — FENTANYL CITRATE (PF) 100 MCG/2ML IJ SOLN
INTRAMUSCULAR | Status: AC
  Filled 2023-09-27: qty 2

## 2023-09-27 MED ORDER — ALBUMIN HUMAN 5 % IV SOLN: INTRAVENOUS | Status: DC | PRN

## 2023-09-27 MED ORDER — HYDROCODONE-ACETAMINOPHEN 7.5-325 MG PO TABS: 1.0000 | ORAL_TABLET | ORAL | Status: DC | PRN

## 2023-09-27 MED ORDER — ROCURONIUM BROMIDE 10 MG/ML (PF) SYRINGE
PREFILLED_SYRINGE | INTRAVENOUS | Status: AC
  Filled 2023-09-27: qty 10

## 2023-09-27 MED ORDER — MENTHOL 3 MG MT LOZG: 1.0000 | LOZENGE | OROMUCOSAL | Status: DC | PRN

## 2023-09-27 MED ORDER — DOCUSATE SODIUM 100 MG PO CAPS
100.0000 mg | ORAL_CAPSULE | Freq: Two times a day (BID) | ORAL | Status: DC
  Administered 2023-09-27 – 2023-09-30 (×5): 100 mg via ORAL
  Filled 2023-09-27 (×8): qty 1

## 2023-09-27 MED ORDER — PHENOL 1.4 % MT LIQD
1.0000 | OROMUCOSAL | Status: DC | PRN
Start: 2023-09-27 — End: 2023-10-04

## 2023-09-27 MED ORDER — FENTANYL CITRATE (PF) 100 MCG/2ML IJ SOLN
25.0000 ug | INTRAMUSCULAR | Status: DC | PRN
  Administered 2023-09-27 (×2): 25 ug via INTRAVENOUS

## 2023-09-27 MED ORDER — LABETALOL HCL 5 MG/ML IV SOLN
INTRAVENOUS | Status: AC
  Filled 2023-09-27: qty 4

## 2023-09-27 MED ORDER — METOCLOPRAMIDE HCL 5 MG PO TABS
5.0000 mg | ORAL_TABLET | Freq: Three times a day (TID) | ORAL | Status: DC | PRN
  Administered 2023-10-01: 5 mg via ORAL
  Filled 2023-09-27: qty 1

## 2023-09-27 MED ORDER — PHENYLEPHRINE HCL-NACL 20-0.9 MG/250ML-% IV SOLN
INTRAVENOUS | Status: AC
  Filled 2023-09-27: qty 250

## 2023-09-27 MED ORDER — DEXAMETHASONE SODIUM PHOSPHATE 4 MG/ML IJ SOLN
INTRAMUSCULAR | Status: DC | PRN
  Administered 2023-09-27: 8 mg via PERINEURAL

## 2023-09-27 MED ORDER — METOCLOPRAMIDE HCL 5 MG/ML IJ SOLN: 5.0000 mg | Freq: Three times a day (TID) | INTRAMUSCULAR | Status: DC | PRN

## 2023-09-27 MED ORDER — SODIUM CHLORIDE 1 G PO TABS
1.0000 g | ORAL_TABLET | Freq: Three times a day (TID) | ORAL | Status: DC
  Administered 2023-09-27 – 2023-10-01 (×8): 1 g via ORAL
  Filled 2023-09-27 (×10): qty 1

## 2023-09-27 MED ORDER — FENTANYL CITRATE (PF) 250 MCG/5ML IJ SOLN
INTRAMUSCULAR | Status: AC
  Filled 2023-09-27: qty 5

## 2023-09-27 MED ORDER — CEFAZOLIN SODIUM-DEXTROSE 2-3 GM-%(50ML) IV SOLR
INTRAVENOUS | Status: DC | PRN
  Administered 2023-09-27: 2 g via INTRAVENOUS

## 2023-09-27 MED ORDER — ONDANSETRON HCL 4 MG/2ML IJ SOLN
INTRAMUSCULAR | Status: AC
Start: 2023-09-27 — End: ?
  Filled 2023-09-27: qty 2

## 2023-09-27 MED ORDER — ROPIVACAINE HCL 5 MG/ML IJ SOLN
INTRAMUSCULAR | Status: DC | PRN
  Administered 2023-09-27: 30 mL via PERINEURAL

## 2023-09-27 MED ORDER — PROPOFOL 1000 MG/100ML IV EMUL
INTRAVENOUS | Status: AC
  Filled 2023-09-27: qty 100

## 2023-09-27 MED ORDER — ONDANSETRON HCL 4 MG/2ML IJ SOLN
INTRAMUSCULAR | Status: DC | PRN
  Administered 2023-09-27: 4 mg via INTRAVENOUS

## 2023-09-27 MED ORDER — ASPIRIN 81 MG PO CHEW
81.0000 mg | CHEWABLE_TABLET | Freq: Two times a day (BID) | ORAL | Status: DC
  Administered 2023-09-27 – 2023-10-04 (×14): 81 mg via ORAL
  Filled 2023-09-27 (×14): qty 1

## 2023-09-27 MED ORDER — FENTANYL CITRATE (PF) 100 MCG/2ML IJ SOLN
INTRAMUSCULAR | Status: AC
  Administered 2023-09-27: 50 ug
  Filled 2023-09-27: qty 2

## 2023-09-27 MED ORDER — SODIUM CHLORIDE 0.9 % IR SOLN
Status: DC | PRN
  Administered 2023-09-27: 1000 mL

## 2023-09-27 MED ORDER — DIPHENHYDRAMINE HCL 12.5 MG/5ML PO ELIX: 12.5000 mg | ORAL_SOLUTION | ORAL | Status: DC | PRN

## 2023-09-27 MED ORDER — DROPERIDOL 2.5 MG/ML IJ SOLN: 0.6250 mg | Freq: Once | INTRAMUSCULAR | Status: DC | PRN

## 2023-09-27 MED ORDER — PHENYLEPHRINE 80 MCG/ML (10ML) SYRINGE FOR IV PUSH (FOR BLOOD PRESSURE SUPPORT)
PREFILLED_SYRINGE | INTRAVENOUS | Status: AC
  Filled 2023-09-27: qty 10

## 2023-09-27 MED ORDER — FENTANYL CITRATE (PF) 250 MCG/5ML IJ SOLN
INTRAMUSCULAR | Status: DC | PRN
  Administered 2023-09-27 (×2): 50 ug via INTRAVENOUS

## 2023-09-27 MED ORDER — SODIUM CHLORIDE 0.9% FLUSH
10.0000 mL | Freq: Two times a day (BID) | INTRAVENOUS | Status: DC
  Administered 2023-09-27 – 2023-09-29 (×3): 10 mL via INTRAVENOUS

## 2023-09-27 MED ORDER — PHENYLEPHRINE HCL-NACL 20-0.9 MG/250ML-% IV SOLN
INTRAVENOUS | Status: DC | PRN
  Administered 2023-09-27 (×2): 80 ug via INTRAVENOUS
  Administered 2023-09-27: 15 ug/min via INTRAVENOUS
  Administered 2023-09-27: 30 ug/min via INTRAVENOUS

## 2023-09-27 SURGICAL SUPPLY — 39 items
BAG COUNTER SPONGE SURGICOUNT (BAG) ×1 IMPLANT
BIPOLAR PROS AML 46 (Hips) ×1 IMPLANT
BLADE SAW THK.89X75X18XSGTL (BLADE) ×1 IMPLANT
COVER SURGICAL LIGHT HANDLE (MISCELLANEOUS) ×1 IMPLANT
DRAPE C-ARM 42X120 X-RAY (DRAPES) IMPLANT
DRAPE HIP W/POCKET STRL (MISCELLANEOUS) ×1 IMPLANT
DRAPE IMP U-DRAPE 54X76 (DRAPES) ×1 IMPLANT
DRAPE INCISE IOBAN 85X60 (DRAPES) ×2 IMPLANT
DRAPE U-SHAPE 47X51 STRL (DRAPES) ×1 IMPLANT
DRSG AQUACEL AG ADV 3.5X10 (GAUZE/BANDAGES/DRESSINGS) IMPLANT
DRSG MEPILEX POST OP 4X8 (GAUZE/BANDAGES/DRESSINGS) ×1 IMPLANT
DURAPREP 26ML APPLICATOR (WOUND CARE) ×1 IMPLANT
ELECT CAUTERY BLADE 6.4 (BLADE) ×1 IMPLANT
ELECT REM PT RETURN 9FT ADLT (ELECTROSURGICAL) ×1
ELECTRODE REM PT RTRN 9FT ADLT (ELECTROSURGICAL) ×1 IMPLANT
FEM STEM 12/14 TAPER SZ 4 HIP (Orthopedic Implant) ×1 IMPLANT
FEMORAL STEM 12/14 TPR SZ4 HIP (Orthopedic Implant) IMPLANT
GLOVE BIOGEL PI IND STRL 7.5 (GLOVE) ×1 IMPLANT
GLOVE BIOGEL PI IND STRL 8 (GLOVE) ×1 IMPLANT
GLOVE ORTHO TXT STRL SZ7.5 (GLOVE) ×2 IMPLANT
GOWN STRL REUS W/ TWL LRG LVL3 (GOWN DISPOSABLE) ×2 IMPLANT
GOWN STRL REUS W/ TWL XL LVL3 (GOWN DISPOSABLE) ×4 IMPLANT
HEAD BIPOLAR PROS AML 46 (Hips) IMPLANT
HEAD FEM STD 28X+1.5 STRL (Hips) IMPLANT
KIT BASIN OR (CUSTOM PROCEDURE TRAY) ×1 IMPLANT
KIT TURNOVER KIT B (KITS) ×1 IMPLANT
MANIFOLD NEPTUNE II (INSTRUMENTS) ×1 IMPLANT
NS IRRIG 1000ML POUR BTL (IV SOLUTION) ×1 IMPLANT
PACK TOTAL JOINT (CUSTOM PROCEDURE TRAY) ×1 IMPLANT
PACK UNIVERSAL I (CUSTOM PROCEDURE TRAY) ×1 IMPLANT
PAD ARMBOARD 7.5X6 YLW CONV (MISCELLANEOUS) ×2 IMPLANT
SET INTERPULSE LAVAGE W/TIP (ORTHOPEDIC DISPOSABLE SUPPLIES) IMPLANT
STAPLER VISISTAT 35W (STAPLE) ×1 IMPLANT
SUT ETHIBOND NAB CT1 #1 30IN (SUTURE) ×2 IMPLANT
SUT VIC AB 1 CT1 27XBRD ANBCTR (SUTURE) IMPLANT
SUT VIC AB 1 CTB1 27 (SUTURE) ×2 IMPLANT
SUT VIC AB 2-0 CT1 TAPERPNT 27 (SUTURE) ×2 IMPLANT
TOWEL GREEN STERILE (TOWEL DISPOSABLE) ×1 IMPLANT
TOWEL GREEN STERILE FF (TOWEL DISPOSABLE) ×1 IMPLANT

## 2023-09-27 NOTE — Progress Notes (Incomplete)
Initial Nutrition Assessment  DOCUMENTATION CODES:   Underweight  INTERVENTION:   -Continue regular diet with encouragement.  -Provide Ensure Enlive po daily, each supplement provides 350 kcal and 20 grams of protein. If patient accepting, increase to BID.    NUTRITION DIAGNOSIS:   Unintentional weight loss related to decreased appetite as evidenced by percent weight loss, mild muscle depletion.   GOAL:   Patient will meet greater than or equal to 90% of their needs   MONITOR:   PO intake, Labs, Supplement acceptance, Weight trends, Skin  REASON FOR ASSESSMENT:   Consult Assessment of nutrition requirement/status  ASSESSMENT: .  82 y/o female presented with severe right hip apin after a fall.   PMH: depression, anxiety, insomnia, hypothyroidism, HTN, chronic headaches, diverticulosis, HLD, hyperthyroidism, umbilical hernia, smoking hx  11/26-right hip hemiarthroplasty   Patient appeared somewhat confused, she took extra time and was indecisive regarding the oral supplements and the amount, agreed to Ensure one daily. Notes appetite has been decreased for past two to three weeks. Today she consumed half of her L meal of tuna salad sand, about half of B. Attempt to obtain more detailed diet hx when able. Stated usual weight 130#. Denies difficulty chewing or swallowing food and or liquids. Encouraged patient to eat well to support healing and to maintain lean body mass.  Per review of EMR, weight loss of 14% within 1.75 months using weight from 08/07/23 of 69.4 kg.    Medications reviewed and include levothyroxine, remeron, senna, sodium chloride 1 gm 3x daily, colace, senokot.  Labs reviewed    NUTRITION - FOCUSED PHYSICAL EXAM:  Flowsheet Row Most Recent Value  Orbital Region No depletion  Upper Arm Region No depletion  Thoracic and Lumbar Region No depletion  Buccal Region No depletion  Temple Region Mild depletion  Clavicle Bone Region Mild depletion   Clavicle and Acromion Bone Region Mild depletion  Scapular Bone Region No depletion  Dorsal Hand No depletion  Patellar Region No depletion  Anterior Thigh Region No depletion  Posterior Calf Region No depletion  Edema (RD Assessment) None  Hair Reviewed  Eyes Reviewed  Mouth Reviewed  Skin Reviewed  Nails Reviewed       Diet Order:   Diet Order             Diet regular Room service appropriate? Yes; Fluid consistency: Thin  Diet effective now                   EDUCATION NEEDS:   Not appropriate for education at this time  Skin:  Skin Assessment: Skin Integrity Issues: Skin Integrity Issues:: Incisions Incisions: R hip  Last BM:  09/27/23  Height:   Ht Readings from Last 1 Encounters:  09/27/23 5\' 4"  (1.626 m)    Weight:   Wt Readings from Last 1 Encounters:  09/27/23 59 kg    Ideal Body Weight:  56.8 kg  BMI:  Body mass index is 22.33 kg/m.  Estimated Nutritional Needs:   Kcal:  1600-1800 kcal/day  Protein:  68-80 gm/day  Fluid:  1600-1800 mL/day    Alvino Chapel, RDLD Clinical Dietitian See AMION for contact information

## 2023-09-27 NOTE — Anesthesia Preprocedure Evaluation (Signed)
Anesthesia Evaluation  Patient identified by MRN, date of birth, ID band Patient awake    Reviewed: Allergy & Precautions, H&P , NPO status , Patient's Chart, lab work & pertinent test results  History of Anesthesia Complications Negative for: history of anesthetic complications  Airway Mallampati: II  TM Distance: >3 FB Neck ROM: Full    Dental no notable dental hx. (+) Dental Advisory Given   Pulmonary former smoker   Pulmonary exam normal breath sounds clear to auscultation       Cardiovascular hypertension, Pt. on home beta blockers negative cardio ROS Normal cardiovascular exam Rhythm:Regular Rate:Normal     Neuro/Psych  Headaches PSYCHIATRIC DISORDERS Anxiety Depression       GI/Hepatic negative GI ROS, Neg liver ROS,,,  Endo/Other  negative endocrine ROSHypothyroidism Hyperthyroidism   Renal/GU CRFRenal diseasenegative Renal ROS  negative genitourinary   Musculoskeletal negative musculoskeletal ROS (+)    Abdominal   Peds negative pediatric ROS (+)  Hematology negative hematology ROS (+)   Anesthesia Other Findings   Reproductive/Obstetrics negative OB ROS                              Anesthesia Physical Anesthesia Plan  ASA: 3  Anesthesia Plan: General   Post-op Pain Management:    Induction:   PONV Risk Score and Plan: 2 and Ondansetron, Dexamethasone and Treatment may vary due to age or medical condition  Airway Management Planned: Oral ETT  Additional Equipment:   Intra-op Plan:   Post-operative Plan: Extubation in OR  Informed Consent: I have reviewed the patients History and Physical, chart, labs and discussed the procedure including the risks, benefits and alternatives for the proposed anesthesia with the patient or authorized representative who has indicated his/her understanding and acceptance.       Plan Discussed with:   Anesthesia Plan Comments:           Anesthesia Quick Evaluation

## 2023-09-27 NOTE — Progress Notes (Addendum)
[facility]  PROGRESS NOTE  Debbie Wells:[id number] DOB: 1941-09-05 DOA: 09/26/2023 PCP: Mahlon Gammon, MD   Brief HPI:   82 y.o. female with medical history significant for depression, anxiety, insomnia, hypothyroidism, and hypertension who presents with severe right hip pain after a fall.  Upon arrival to the ED,   Plain radiographs demonstrate acute impacted subcapital right femoral neck fracture.     Assessment/Plan:   1. Right hip displaced subcapital femoral neck fracture -Underwent right hip hemiarthroplasty -Continue pain control, supportive care -DVT prophylaxis with aspirin    2. Hyponatremia  -Improved with salt tablets -Will continue salt tablets ; home medication    3. Hypertension  - Continue amlodipine and Toprol  -Continue hydralazine as needed   4. Depression, anxiety, insomnia  - Continue Buspar, Remeron, Cymbalta, Zyprexa, trazodone, and as-needed Xanax     5. Hypothyroidism  - Synthroid     6. CKD 3A  -Creatinine elevated at 1.41 today -She did have urinary retention today;  -Bladder scan showed 664 mL of urine required In-N-Out cath -Follow renal function in a.m.   7.  Recurrent UTIs -Continue suppressive therapy with trimethoprim   Medications     amLODipine  2.5 mg Oral Daily   aspirin  81 mg Oral BID   busPIRone  15 mg Oral BID   docusate sodium  100 mg Oral BID   DULoxetine  60 mg Oral Daily   fentaNYL       gabapentin  200 mg Oral BID   labetalol       levothyroxine  100 mcg Oral Q0600   metoprolol succinate  12.5 mg Oral Daily   mirtazapine  7.5 mg Oral QHS   OLANZapine  5 mg Oral Daily   senna  1 tablet Oral Daily   sodium chloride flush  10 mL Intravenous Q12H   sodium chloride  1 g Oral TID   trimethoprim  100 mg Oral Daily     Data Reviewed:   CBG:  No results for input(s): "GLUCAP" in the last 168 hours.  SpO2: 97 % O2 Flow Rate (L/min): 2 L/min    Vitals:   09/27/23 1619 09/27/23 1630 09/27/23  1645 09/27/23 1708  BP: (!) 187/102 (!) 182/82 (!) 164/80 (!) 162/82  Pulse: (!) 103 (!) 101 92 99  Resp: 17 12 12 14   Temp:   (!) 97.2 F (36.2 C) 98.2 F (36.8 C)  TempSrc:    Oral  SpO2: 94% 97% 97% 97%  Weight:      Height:          Data Reviewed:  Basic Metabolic Panel: Recent Labs  Lab 09/23/23 0000 09/26/23 2043 09/27/23 0607  NA 125* 133* 133*  K 4.5 3.7 4.2  CL 92* 103 103  CO2 27* 24 23  GLUCOSE  --  133* 115*  BUN 15 8 9   CREATININE 1.0 1.07* 1.41*  CALCIUM 9.1 9.4 9.2    CBC: Recent Labs  Lab 09/23/23 0000 09/26/23 2043 09/27/23 0607  WBC 5.3 5.2 10.5  NEUTROABS 3,058.00 3.5  --   HGB 11.1* 11.8* 10.9*  HCT 33* 36.3 33.0*  MCV  --  96.0 92.7  PLT 223 216 190    LFT Recent Labs  Lab 09/23/23 0000 09/26/23 2043  AST 19 29  ALT 14 23  ALKPHOS 76 82  BILITOT  --  0.7  PROT  --  6.6  ALBUMIN 3.7 3.6     Antibiotics: Anti-infectives (From  admission, onward)    Start     Dose/Rate Route Frequency Ordered Stop   09/27/23 2000  ceFAZolin (ANCEF) IVPB 1 g/50 mL premix        1 g 100 mL/hr over 30 Minutes Intravenous Every 6 hours 09/27/23 1710 09/28/23 0759   09/27/23 1000  trimethoprim (TRIMPEX) tablet 100 mg        100 mg Oral Daily 09/26/23 2235          DVT prophylaxis: Aspirin started per Ortho  Code Status: Full code  Family Communication: Discussed with patient's son at bedside   CONSULTS orthopedics   Subjective   Seen after surgery, seems anxious.  Otherwise no complaints   Objective    Physical Examination:   Appears mildly anxious Answering questions appropriately Abdomen is soft, nontender Extremities no edema   Status is: Inpatient:             [name]   [facility] If 7PM-7AM, please contact night-coverage at [email or web address], Office  [phone]   09/27/2023, 5:45 PM  LOS: 1 day

## 2023-09-27 NOTE — Discharge Instructions (Signed)

## 2023-09-27 NOTE — TOC CAGE-AID Note (Signed)
Transition of Care [facility]) - CAGE-AID Screening   Patient Details  Name: Debbie Wells MRN: [id number] Date of Birth: 1940/11/15  [facility]) CM/SW Contact:    Katha Hamming, RN Phone Number: 09/27/2023, 8:43 PM   Clinical Narrative: Pt declined screening.   CAGE-AID Screening: Substance Abuse Screening unable to be completed due to: : Patient Refused

## 2023-09-27 NOTE — Anesthesia Postprocedure Evaluation (Signed)
Anesthesia Post Note  Patient: Debbie Wells  Procedure(s) Performed: ARTHROPLASTY BIPOLAR HIP (HEMIARTHROPLASTY) (Right: Hip)     Patient location during evaluation: PACU Anesthesia Type: General Level of consciousness: sedated and patient cooperative Pain management: pain level controlled Vital Signs Assessment: post-procedure vital signs reviewed and stable Respiratory status: spontaneous breathing Cardiovascular status: stable Anesthetic complications: no   No notable events documented.  Last Vitals:  Vitals:   09/27/23 1645 09/27/23 1708  BP: (!) 164/80 (!) 162/82  Pulse: 92 99  Resp: 12 14  Temp: (!) 36.2 C 36.8 C  SpO2: 97% 97%    Last Pain:  Vitals:   09/27/23 1708  TempSrc: Oral  PainSc:                  Lewie Loron

## 2023-09-27 NOTE — Transfer of Care (Signed)
Immediate Anesthesia Transfer of Care Note  Patient: KAYLYNE KOOGLER  Procedure(s) Performed: ARTHROPLASTY BIPOLAR HIP (HEMIARTHROPLASTY) (Right: Hip)  Patient Location: PACU  Anesthesia Type:General  Level of Consciousness: sedated and drowsy  Airway & Oxygen Therapy: Patient Spontanous Breathing and Patient connected to face mask oxygen  Post-op Assessment: Report given to RN and Post -op Vital signs reviewed and stable  Post vital signs: Reviewed and stable  Last Vitals:  Vitals Value Taken Time  BP 142/72 09/27/23 1528  Temp 97.6   Pulse 85 09/27/23 1530  Resp 13 09/27/23 1530  SpO2 100 % 09/27/23 1530  Vitals shown include unfiled device data.  Last Pain:  Vitals:   09/27/23 1214  TempSrc: Oral  PainSc:          Complications: No notable events documented.

## 2023-09-27 NOTE — Progress Notes (Signed)
Patient ID: Debbie Wells, female   DOB: [birth year], 82 y.o.   MRN: [id number] I have seen and examined the patient in the OR holding room.  I did speak to her son at the bedside.  We went over her x-rays.  She has a mildly displaced right hip femoral neck fracture.  Given her mild dementia we have recommended a bipolar partial hip arthroplasty to treat this fracture.  We had a long and thorough discussion about the risks and benefits of surgery.  The right operative hip has been marked and informed consent has been obtained.  I was able to review her chart as well.

## 2023-09-27 NOTE — Consult Note (Signed)
Orthopaedic Trauma Service (OTS) Consult   Patient ID: Debbie Wells MRN: [id number] DOB/AGE: [birth year] 82 y.o.   Reason for Consult: Right femoral neck fracture  Referring Physician:  Glendora Score, MD (ED)   HPI: Debbie Wells is an 82 y.o. female with medical history notable for severe anxiety and depression who sustained a ground-level fall yesterday with resultant right subcapital femoral neck fracture.  Patient was brought to the emergency department where she was diagnosed with this fracture.  She was admitted to the medical service with orthopedic consultation.  Patient [facility]  She was previously living on the independent side with her husband however there has been some decline over the last 6 months or so with the help of both of them and family felt that they would be best served at an assisted living facility.  Patient does not recall much in the way of pertinent history and most of the information was gathered from her son Chrissie Noa whom I spoke to at length on the phone.  Chrissie Noa indicates that patient has had severe anxiety and depression over the years and was remotely treated at a facility in [place] which had significant positive impact on her overall mental health.  She goes through cycles of severe anxiety where she gets really worked up and is really incapacitated from doing things.  Over the last several months she has had some decline in her mental health and has limited her activity.  Family feels that this is somewhat medication related.  Patient's husband is a Teacher, early years/pre and by report will randomly adjust or change medications or withhold medications as he feels it is necessary.  Son does acknowledge that his father remains well but does note that this is inappropriate and.  She reports no assistive basis at baseline but does think that she should use a cane.  She has had several episodes of  falling over the last couple of months as well  Patient denies pain elsewhere.  Only notes right hip pain.  Denies any numbness or tingling.  Past Medical History:  Diagnosis Date   Adenomatous colon polyp    Adenomatous polyp of colon 12/03/2004   6mm   Anxiety    Chronic headaches    Chronic insomnia    Depression    Diverticulosis    External hemorrhoids    Hyperlipidemia    Hypertension    Hyperthyroidism    Hypothyroidism    Umbilical hernia     Past Surgical History:  Procedure Laterality Date   ABDOMINAL HYSTERECTOMY  1975   APPENDECTOMY  1975   COLONOSCOPY  03/03/2010,12/03/2004   ESOPHAGOGASTRODUODENOSCOPY  12/16/11   Dr. Stan Head   OOPHORECTOMY     spinal lipoma removed  2006   TOTAL ABDOMINAL HYSTERECTOMY      Family History  Problem Relation Age of Onset   Esophageal cancer Father    Stomach cancer Father        mets from esophagus   Heart disease Mother    Irritable bowel syndrome Mother    Kidney disease Mother    Other Mother        cushings disease   Addison's disease Mother    Heart failure Mother    Stroke Mother    Hypertension Mother    Stroke Maternal Grandmother    Hypertension Maternal Grandmother    Colon cancer Neg Hx    Heart  attack Neg Hx     Social History:  reports that she quit smoking about 30 years ago. Her smoking use included cigarettes. She has never used smokeless tobacco. She reports current alcohol use of about 2.0 standard drinks of alcohol per week. She reports that she does not use drugs.  Allergies:  Allergies  Allergen Reactions   Hydrocodone-Acetaminophen Rash    Vicodin=REACTION: rash  Not listed on the MAR   Iodinated Contrast Media Hives and Rash    Per patient she has developed hives and itching in upper trunk of body after last 2 CT scans once she got homes that she has just treated at home with benadryl. She has not told anyone until now. States she was able to bear it  Other reaction(s): Rash Per patient  she has developed hives and itching in upper trunk of body after last 2 CT scans once she got homes that she has just treated at home with benadryl. She has not told anyone until now. States she was able to bear it   Not listed on the [facility]    Sulfa Antibiotics Rash    REACTION: rash Other reaction(s): Unknown   Codeine     Unknown reaction  Not listed on the MAR    Sulfonamide Derivatives     REACTION: rash   Terbinafine     Unknown reaction  Not listed on the [facility]    Benazepril Cough    Not listed on the [facility]     Medications: I have reviewed the patient's current medications.   Results for orders placed or performed during the hospital encounter of 09/26/23 (from the past 48 hour(s))  Comprehensive metabolic panel     Status: Abnormal   Collection Time: 09/26/23  8:43 PM  Result Value Ref Range   Sodium 133 (L) 135 - 145 mmol/L   Potassium 3.7 3.5 - 5.1 mmol/L   Chloride 103 98 - 111 mmol/L   CO2 24 22 - 32 mmol/L   Glucose, Bld 133 (H) 70 - 99 mg/dL    Comment: Glucose reference range applies only to samples taken after fasting for at least 8 hours.   BUN 8 8 - 23 mg/dL   Creatinine, Ser 2.13 (H) 0.44 - 1.00 mg/dL   Calcium 9.4 8.9 - 08.6 mg/dL   Total Protein 6.6 6.5 - 8.1 g/dL   Albumin 3.6 3.5 - 5.0 g/dL   AST 29 15 - 41 U/L   ALT 23 0 - 44 U/L   Alkaline Phosphatase 82 38 - 126 U/L   Total Bilirubin 0.7 <1.2 mg/dL   GFR, Estimated 52 (L) >60 mL/min    Comment: (NOTE) Calculated using the CKD-EPI Creatinine Equation (2021)    Anion gap 6 5 - 15    Comment: Performed at [facility], [address] [place], [place] [address]  CBC with Differential     Status: Abnormal   Collection Time: 09/26/23  8:43 PM  Result Value Ref Range   WBC 5.2 4.0 - 10.5 K/uL   RBC 3.78 (L) 3.87 - 5.11 MIL/uL   Hemoglobin 11.8 (L) 12.0 - 15.0 g/dL   HCT 96.2 95.2 - 84.1 %   MCV 96.0 80.0 - 100.0 fL   MCH 31.2 26.0 - 34.0 pg   MCHC 32.5 30.0 - 36.0 g/dL   RDW 32.4 40.1 -  02.7 %   Platelets 216 150 - 400 K/uL   nRBC 0.0 0.0 - 0.2 %  Neutrophils Relative % 67 %   Neutro Abs 3.5 1.7 - 7.7 K/uL   Lymphocytes Relative 18 %   Lymphs Abs 1.0 0.7 - 4.0 K/uL   Monocytes Relative 10 %   Monocytes Absolute 0.5 0.1 - 1.0 K/uL   Eosinophils Relative 3 %   Eosinophils Absolute 0.2 0.0 - 0.5 K/uL   Basophils Relative 1 %   Basophils Absolute 0.1 0.0 - 0.1 K/uL   Immature Granulocytes 1 %   Abs Immature Granulocytes 0.06 0.00 - 0.07 K/uL    Comment: Performed at [facility], [address] [place], [place] [address]  ABO/Rh     Status: None   Collection Time: 09/26/23  8:43 PM  Result Value Ref Range   ABO/RH(D)      A POS Performed at [facility], [address] [place], [place] [address]   CBC     Status: Abnormal   Collection Time: 09/27/23  6:07 AM  Result Value Ref Range   WBC 10.5 4.0 - 10.5 K/uL   RBC 3.56 (L) 3.87 - 5.11 MIL/uL   Hemoglobin 10.9 (L) 12.0 - 15.0 g/dL   HCT 09.8 (L) 11.9 - 14.7 %   MCV 92.7 80.0 - 100.0 fL   MCH 30.6 26.0 - 34.0 pg   MCHC 33.0 30.0 - 36.0 g/dL   RDW 82.9 56.2 - 13.0 %   Platelets 190 150 - 400 K/uL   nRBC 0.0 0.0 - 0.2 %    Comment: Performed at [facility], [address], [place], [place] [address]  Basic metabolic panel     Status: Abnormal   Collection Time: 09/27/23  6:07 AM  Result Value Ref Range   Sodium 133 (L) 135 - 145 mmol/L   Potassium 4.2 3.5 - 5.1 mmol/L   Chloride 103 98 - 111 mmol/L   CO2 23 22 - 32 mmol/L   Glucose, Bld 115 (H) 70 - 99 mg/dL    Comment: Glucose reference range applies only to samples taken after fasting for at least 8 hours.   BUN 9 8 - 23 mg/dL   Creatinine, Ser 4.69 (H) 0.44 - 1.00 mg/dL   Calcium 9.2 8.9 - 62.9 mg/dL   GFR, Estimated 37 (L) >60 mL/min    Comment: (NOTE) Calculated using the CKD-EPI Creatinine Equation (2021)    Anion gap 7 5 - 15    Comment: Performed at [facility], [address] [place], [place] [address]  Type and  screen [facility]     Status: None   Collection Time: 09/27/23  6:08 AM  Result Value Ref Range   ABO/RH(D) A POS    Antibody Screen NEG    Sample Expiration      09/30/2023,2359 Performed at [facility], [address], [place], [place] [address]     DG FEMUR PORT, MIN 2 VIEWS RIGHT  Result Date: 09/27/2023 CLINICAL DATA:  Right hip fracture EXAM: RIGHT FEMUR PORTABLE 2 VIEW COMPARISON:  None Available. FINDINGS: Two view radiograph of the right femur again demonstrates an impacted subcapital right femoral neck fracture. No dislocation. Right hip joint space preserved. The distal fracture fragment consisting of the femoral shaft is intact. Limited images of the right knee are unremarkable. IMPRESSION: 1. Impacted subcapital right femoral neck fracture. Distal femur appears intact. Electronically Signed   By: Helyn Numbers M.D.   On: 09/27/2023 00:13   DG CHEST PORT 1 VIEW  Result Date: 09/27/2023 CLINICAL DATA:  Right hip fracture. Medical clearance for surgical intervention EXAM: PORTABLE CHEST 1 VIEW COMPARISON:  None Available. FINDINGS: The heart size and mediastinal contours are within normal limits. Both lungs are clear. The visualized skeletal structures are unremarkable. IMPRESSION: No active disease. Electronically Signed   By: Helyn Numbers M.D.   On: 09/27/2023 00:12   DG Hip Unilat W or Wo Pelvis 2-3 Views Right  Result Date: 09/26/2023 CLINICAL DATA:  Fall EXAM: DG HIP (WITH OR WITHOUT PELVIS) 2-3V RIGHT COMPARISON:  None Available. FINDINGS: There is an acute, impacted subcapital right femoral neck fracture. The femoral head is still seated within the right acetabulum and right hip joint space is preserved. The pelvis and visualized left hip are intact. Sacroiliac and left hip joint spaces are preserved. Soft tissues are unremarkable. IMPRESSION: 1. Acute, impacted subcapital right femoral neck fracture. Electronically Signed   By: Helyn Numbers M.D.    On: 09/26/2023 22:02    Intake/Output      11/25 0701 11/26 0700 11/26 0701 11/27 0700   I.V. (mL/kg) 361.2 (6.1)    Total Intake(mL/kg) 361.2 (6.1)    Net +361.2            Review of Systems  Constitutional:  Negative for chills and fever.  Respiratory:  Negative for shortness of breath.   Cardiovascular:  Negative for chest pain and palpitations.  Gastrointestinal:  Negative for nausea and vomiting.  Neurological:  Negative for tingling and sensory change.   Blood pressure (!) 150/85, pulse (!) 104, temperature 98.7 F (37.1 C), resp. rate 11, height 5\' 4"  (1.626 m), weight 59 kg, SpO2 97%. Physical Exam Vitals and nursing note reviewed.  Constitutional:      General: She is awake.     Comments: Tired appearing female, NAD  Mild to moderate sarcopenia   Eyes:     Extraocular Movements: Extraocular movements intact.  Cardiovascular:     Heart sounds: S1 normal and S2 normal.  Pulmonary:     Effort: Pulmonary effort is normal. No respiratory distress.  Abdominal:     Comments: Soft, NT, ND, + BS  Musculoskeletal:     Comments:  Right Lower Extremity  Inspection: No significant swelling to hip or thigh.  No ecchymosis or abrasions to the right thigh.  Skin appears to be in good condition  Bony eval: Tenderness and discomfort with gentle manipulation of her hip Knee is nontender Ankle is nontender  Soft tissue: No perceived knee effusion No swelling distally  Sensation: DPN, SPN, TN sensory functions grossly intact Obturator sensation in the lateral femoral cutaneous nerve sensation intact Motor: EHL, FHL, lesser toe motor function intact Ankle flexion, extension, inversion eversion intact  Vascular: Extremity is warm + DP pulse No DCT  Compartments are soft   Neurological:     Mental Status: She is alert.  Psychiatric:        Behavior: Behavior is cooperative.     Assessment/Plan:  82 year old female ground-level fall with right subcapital  femoral neck fracture, deconditioning  -Ground-level fall  -Right subcapital femoral neck fracture  Given the full clinical picture believe that hemiarthroplasty is the appropriate intervention   Have discussed the case with Dr. Magnus Ivan and he has graciously agreed to assist as he has time earlier in the day today to  perform her surgery then we do.  We would be able to get to her surgery until later on this evening.   I have discussed in  great detail with patient's son and he agrees to proceed.  He will sign consent once he arrives and seized his mom in Short stay   We did discuss that that she could have continued declined postoperatively given her gradual physical as well as mental health decline over the last several months.  We are hopeful that once she has been fixed she will be able to start participating with therapies and resume activity.    - Pain management:  Multimodal  Minimize narcotics  - ABL anemia/Hemodynamics  Monitor   - Medical issues   Per primary   - ID:   Periop abx  - Metabolic Bone Disease:  Fracture is a fragility fracture which is suggestive of osteoporosis  RD consult postoperatively to optimize nutrition  - Activity:  WBAT post op   - FEN/GI prophylaxis/Foley/Lines:  NPO  - Dispo:  OR later this afternoon with Dr. Ella Bodo, PA-C [phone] (C) 09/27/2023, 11:59 AM  [facility] [address] [place] [address] [phone] [phone] (F)    After 5pm and on the weekends please log on to Amion, go to orthopaedics and the look under the [facility] Call for the provider(s) on call. You can also call our office at [phone] and then follow the prompts to be connected to the call team.

## 2023-09-27 NOTE — ED Provider Notes (Signed)
Debbie Wells [facility] Provider Note  CSN: [id number] Arrival date & time: 09/26/23 1955  Chief Complaint(s) Fall (Arrived via EMS from [facility] with c/o fall. Pt reports she tripped over her feet after finishing dinner. C/O right hip and right ankle pain. EMS reports no blood thinners, no loss of consciousness. Pt able to recall events. GCS 15. Pt reports she uses a walker, but did not have a walker when she fell. )  HPI Debbie Wells is a 82 y.o. female with PMH anxiety, depression, HTN, HLD, hypothyroidism who presents emergency room for evaluation of a fall with right hip pain.  Patient was in her kitchen and not using her walker when she fell landing on her right hip.  Unable to get off the ground.  Arrives with significant pain to the right hip but denies head strike, loss of consciousness, nausea, vomiting, chest pain, shortness of breath or other systemic symptoms.   Past Medical History Past Medical History:  Diagnosis Date   Adenomatous colon polyp    Adenomatous polyp of colon 12/03/2004   6mm   Anxiety    Chronic headaches    Chronic insomnia    Depression    Diverticulosis    External hemorrhoids    Hyperlipidemia    Hypertension    Hyperthyroidism    Hypothyroidism    Umbilical hernia    Patient Active Problem List   Diagnosis Date Noted   Closed right hip fracture, initial encounter (HCC) 09/26/2023   Hypothyroidism    Anxiety    Hyponatremia 06/11/2023   Hardening of the aorta (main artery of the heart) (HCC) 12/22/2020   Moderate recurrent major depression (HCC) 12/22/2020   Recurrent urinary tract infection 12/22/2020   History of recurrent UTIs 10/03/2020   Hyperlipidemia 10/03/2020   Major depressive disorder, recurrent severe without psychotic features (HCC) 10/03/2020   Hereditary and idiopathic neuropathy, unspecified 05/06/2020   Chronic insomnia 03/20/2020   Chronic kidney disease due to hypertension 01/08/2020    Chronic kidney disease, stage 3a (HCC) 01/07/2020   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/2018   Insomnia due to medical condition 10/12/2018   Hyperthyroidism determined by thyroid function test 10/12/2018   Hypertension due to endocrine disorder 10/12/2018   Tremor observed on examination 10/12/2018   Hypo-osmolality and hyponatremia 09/11/2018   Impaired fasting glucose 09/11/2018   Migraine 09/11/2018   Chronic constipation 06/19/2018   Maxillary sinus cyst 12/14/2017   Referred otalgia of left ear 12/14/2017   Tinnitus of left ear 03/03/2017   Cramp in lower leg associated with rest 05/31/2016   IBS (irritable bowel syndrome) 03/12/2014   History of colonic polyps 02/12/2013   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 Authorizing Provider  acetaminophen (TYLENOL) 325 MG tablet Take 650 mg by mouth every 4 (four) hours as needed (for pain).   Yes [provider]  ALPRAZolam Prudy Feeler) 0.5 MG tablet Take 1 tablet by mouth 3 (three) times daily as needed. 10/22/20  Yes [provider]  amLODipine (NORVASC)  2.5 MG tablet Take 2.5 mg by mouth daily.   Yes [provider]  busPIRone (BUSPAR) 15 MG tablet Take 15 mg by mouth 2 (two) times daily. 12/03/20  Yes [provider]  ciprofloxacin (CIPRO) 500 MG tablet Take 500 mg by mouth 2 (two) times daily. 09/24/23  Yes [provider]  Docusate Sodium 100 MG capsule Take 100 mg by mouth as needed for  constipation.   Yes [provider]  DULoxetine (CYMBALTA) 60 MG capsule Take 60 mg by mouth daily.   Yes [provider]  estradiol (ESTRACE) 0.1 MG/GM vaginal cream Place 1 Applicatorful vaginally 2 (two) times a week. Tuesday and Friday 02/28/23  Yes [provider]  gabapentin (NEURONTIN) 100 MG capsule Take 200 mg by mouth 2 (two) times daily. 10/22/20  Yes [provider]  levothyroxine (SYNTHROID) 100 MCG tablet Take 100 mcg by mouth daily. 06/18/22  Yes [provider]  losartan (COZAAR) 50 MG tablet Take 50 mg by mouth 2 (two) times daily. 09/04/20  Yes [provider]  meloxicam (MOBIC) 15 MG tablet Take 15 mg by mouth daily as needed for pain.   Yes [provider]  metoprolol succinate (TOPROL-XL) 12.5 mg TB24 24 hr tablet Take 12.5 mg by mouth daily.   Yes [provider]  mirtazapine (REMERON) 7.5 MG tablet Take 7.5 mg by mouth at bedtime. 09/20/23  Yes [provider]  OLANZapine (ZYPREXA) 5 MG tablet Take 5 mg by mouth daily.   Yes [provider]  ondansetron (ZOFRAN) 8 MG tablet Take 8 mg by mouth as needed for nausea or vomiting.   Yes [provider]  polyethylene glycol (MIRALAX) 17 g packet Take 17 g by mouth daily. 09/08/23  Yes Young, Harmon Dun, DO  saccharomyces boulardii (FLORASTOR) 250 MG capsule Take 250 mg by mouth 2 (two) times daily.   Yes [provider]  senna (SENOKOT) 8.6 MG TABS tablet Take 1 tablet by mouth daily. May also take 2 tablets by mouth as needed for constipation   Yes [provider]  sodium chloride 1 g tablet Take 1 g by mouth 3 (three) times daily.   Yes [provider]  traZODone (DESYREL) 50 MG tablet Take 50 mg by mouth at bedtime as needed for sleep.   Yes [provider]  trimethoprim (TRIMPEX) 100 MG tablet Take 100 mg by mouth daily. Patient not taking: Reported on 09/27/2023    [provider]                                                                                                                                     Past Surgical History Past Surgical History:  Procedure Laterality Date   ABDOMINAL HYSTERECTOMY  1975   APPENDECTOMY  1975   COLONOSCOPY  03/03/2010,12/03/2004   ESOPHAGOGASTRODUODENOSCOPY  12/16/11   Dr. Stan Head   OOPHORECTOMY  spinal lipoma removed  2006   TOTAL ABDOMINAL HYSTERECTOMY     Family History Family History  Problem Relation Age of Onset   Esophageal cancer Father    Stomach cancer Father        mets from esophagus   Heart disease Mother    Irritable bowel syndrome Mother    Kidney disease Mother    Other Mother        cushings disease   Addison's disease Mother    Heart failure Mother    Stroke Mother    Hypertension Mother    Stroke Maternal Grandmother    Hypertension Maternal Grandmother    Colon cancer Neg Hx    Heart attack Neg Hx     Social History Social History   Tobacco Use   Smoking status: Former    Current packs/day: 0.00    Types: Cigarettes    Quit date: 01/23/1993    Years since quitting: 30.6   Smokeless tobacco: Never  Vaping Use   Vaping status: Never Used  Substance Use Topics   Alcohol use: Yes    Alcohol/week: 2.0 standard drinks of alcohol    Types: 2 Glasses of wine per week   Drug use: No   Allergies Hydrocodone-acetaminophen, Iodinated contrast media, Sulfa antibiotics, Codeine, Sulfonamide derivatives, Terbinafine, and Benazepril  Review of Systems Review of Systems  Musculoskeletal:  Positive for arthralgias and myalgias.    Physical Exam Vital Signs  I have reviewed the triage vital signs BP (!) 148/67   Pulse (!) 104   Temp 98.7 F (37.1 C)   Resp 12   Ht 5\' 4"  (1.626 m)   Wt 59 kg   SpO2 97%   BMI 22.31 kg/m   Physical Exam Vitals and nursing note reviewed.  Constitutional:      General: She is not in acute distress.    Appearance: She is well-developed.  HENT:     Head:  Normocephalic and atraumatic.  Eyes:     Conjunctiva/sclera: Conjunctivae normal.  Cardiovascular:     Rate and Rhythm: Normal rate and regular rhythm.     Heart sounds: No murmur heard. Pulmonary:     Effort: Pulmonary effort is normal. No respiratory distress.     Breath sounds: Normal breath sounds.  Abdominal:     Palpations: Abdomen is soft.     Tenderness: There is no abdominal tenderness.  Musculoskeletal:        General: Tenderness and deformity present. No swelling.     Cervical back: Neck supple.  Skin:    General: Skin is warm and dry.     Capillary Refill: Capillary refill takes less than 2 seconds.  Neurological:     Mental Status: She is alert.  Psychiatric:        Mood and Affect: Mood normal.     ED Results and Treatments Labs (all labs ordered are listed, but only abnormal results are displayed) Labs Reviewed  COMPREHENSIVE METABOLIC PANEL - Abnormal; Notable for the following components:      Result Value   Sodium 133 (*)    Glucose, Bld 133 (*)    Creatinine, Ser 1.07 (*)    GFR, Estimated 52 (*)    All other components within normal limits  CBC WITH DIFFERENTIAL/PLATELET - Abnormal; Notable for the following components:   RBC 3.78 (*)    Hemoglobin 11.8 (*)    All other components within normal limits  CBC - Abnormal; Notable for the following components:  RBC 3.56 (*)    Hemoglobin 10.9 (*)    HCT 33.0 (*)    All other components within normal limits  BASIC METABOLIC PANEL - Abnormal; Notable for the following components:   Sodium 133 (*)    Glucose, Bld 115 (*)    Creatinine, Ser 1.41 (*)    GFR, Estimated 37 (*)    All other components within normal limits  TYPE AND SCREEN  ABO/RH                                                                                                                          Radiology DG FEMUR PORT, MIN 2 VIEWS RIGHT  Result Date: 09/27/2023 CLINICAL DATA:  Right hip fracture EXAM: RIGHT FEMUR PORTABLE 2 VIEW  COMPARISON:  None Available. FINDINGS: Two view radiograph of the right femur again demonstrates an impacted subcapital right femoral neck fracture. No dislocation. Right hip joint space preserved. The distal fracture fragment consisting of the femoral shaft is intact. Limited images of the right knee are unremarkable. IMPRESSION: 1. Impacted subcapital right femoral neck fracture. Distal femur appears intact. Electronically Signed   By: Helyn Numbers M.D.   On: 09/27/2023 00:13   DG CHEST PORT 1 VIEW  Result Date: 09/27/2023 CLINICAL DATA:  Right hip fracture. Medical clearance for surgical intervention EXAM: PORTABLE CHEST 1 VIEW COMPARISON:  None Available. FINDINGS: The heart size and mediastinal contours are within normal limits. Both lungs are clear. The visualized skeletal structures are unremarkable. IMPRESSION: No active disease. Electronically Signed   By: Helyn Numbers M.D.   On: 09/27/2023 00:12   DG Hip Unilat W or Wo Pelvis 2-3 Views Right  Result Date: 09/26/2023 CLINICAL DATA:  Fall EXAM: DG HIP (WITH OR WITHOUT PELVIS) 2-3V RIGHT COMPARISON:  None Available. FINDINGS: There is an acute, impacted subcapital right femoral neck fracture. The femoral head is still seated within the right acetabulum and right hip joint space is preserved. The pelvis and visualized left hip are intact. Sacroiliac and left hip joint spaces are preserved. Soft tissues are unremarkable. IMPRESSION: 1. Acute, impacted subcapital right femoral neck fracture. Electronically Signed   By: Helyn Numbers M.D.   On: 09/26/2023 22:02    Pertinent labs & imaging results that were available during my care of the patient were reviewed by me and considered in my medical decision making (see MDM for details).  Medications Ordered in ED Medications  oxyCODONE (Oxy IR/ROXICODONE) immediate release tablet 5 mg (5 mg Oral Given 09/27/23 0517)  fentaNYL (SUBLIMAZE) injection 12.5-50 mcg (50 mcg Intravenous Given 09/27/23  0751)  polyethylene glycol (MIRALAX / GLYCOLAX) packet 17 g (has no administration in time range)  methocarbamol (ROBAXIN) tablet 500 mg (500 mg Oral Given 09/27/23 0026)    Or  methocarbamol (ROBAXIN) injection 500 mg ( Intravenous See Alternative 09/27/23 0026)  ondansetron (ZOFRAN) injection 4 mg (has no administration in time range)  trimethoprim (TRIMPEX) tablet 100 mg (has no administration in  time range)  amLODipine (NORVASC) tablet 2.5 mg (has no administration in time range)  metoprolol succinate (TOPROL-XL) 24 hr tablet 12.5 mg (has no administration in time range)  ALPRAZolam (XANAX) tablet 0.5 mg (0.5 mg Oral Given 09/27/23 0028)  busPIRone (BUSPAR) tablet 15 mg (15 mg Oral Given 09/27/23 0033)  DULoxetine (CYMBALTA) DR capsule 60 mg (has no administration in time range)  mirtazapine (REMERON) tablet 7.5 mg (7.5 mg Oral Given 09/27/23 0028)  OLANZapine (ZYPREXA) tablet 5 mg (has no administration in time range)  traZODone (DESYREL) tablet 50 mg (50 mg Oral Given 09/27/23 0026)  levothyroxine (SYNTHROID) tablet 100 mcg (100 mcg Oral Given 09/27/23 0517)  senna (SENOKOT) tablet 8.6 mg (has no administration in time range)  gabapentin (NEURONTIN) capsule 200 mg (200 mg Oral Given 09/27/23 0026)  sodium chloride tablet 1 g (1 g Oral Given 09/27/23 0027)  0.9 %  sodium chloride infusion ( Intravenous New Bag/Given 09/27/23 0007)  hydrALAZINE (APRESOLINE) tablet 25 mg (25 mg Oral Given 09/27/23 0027)  morphine (PF) 4 MG/ML injection 4 mg (4 mg Intravenous Given 09/26/23 2022)  ondansetron (ZOFRAN) injection 4 mg (4 mg Intravenous Given 09/26/23 2022)  morphine (PF) 4 MG/ML injection 4 mg (4 mg Intravenous Given 09/26/23 2133)  fentaNYL (SUBLIMAZE) 100 MCG/2ML injection (50 mcg  Given 09/27/23 1128)                                                                                                                                     Procedures Procedures  (including critical care  time)  Medical Decision Making / ED Course   This patient presents to the ED for concern of fall, hip pain, this involves an extensive number of treatment options, and is a complaint that carries with it a high risk of complications and morbidity.  The differential diagnosis includes fracture, dislocation, hematoma, contusion, ligamentous injury  MDM: Patient with activation of a fall with right hip pain.  Physical exam with tenderness with external rotation of the right hip, tenderness palpation of the hip but trauma evaluation otherwise unremarkable.  Laboratory evaluation with a hemoglobin of 11.8, sodium 133 but is otherwise unremarkable.  X-ray of the hip showing an impacted subcapital femoral neck fracture.  Spoke with Dr. Carola Frost of orthopedics who will evaluate the patient for surgery tomorrow.  Patient admitted to medicine   Additional history obtained:  -External records from outside source obtained and reviewed including: Chart review including previous notes, labs, imaging, consultation notes   Lab Tests: -I ordered, reviewed, and interpreted labs.   The pertinent results include:   Labs Reviewed  COMPREHENSIVE METABOLIC PANEL - Abnormal; Notable for the following components:      Result Value   Sodium 133 (*)    Glucose, Bld 133 (*)    Creatinine, Ser 1.07 (*)    GFR, Estimated 52 (*)    All other components within normal limits  CBC WITH DIFFERENTIAL/PLATELET - Abnormal; Notable for the following components:   RBC 3.78 (*)    Hemoglobin 11.8 (*)    All other components within normal limits  CBC - Abnormal; Notable for the following components:   RBC 3.56 (*)    Hemoglobin 10.9 (*)    HCT 33.0 (*)    All other components within normal limits  BASIC METABOLIC PANEL - Abnormal; Notable for the following components:   Sodium 133 (*)    Glucose, Bld 115 (*)    Creatinine, Ser 1.41 (*)    GFR, Estimated 37 (*)    All other components within normal limits  TYPE AND  SCREEN  ABO/RH      EKG   EKG Interpretation Date/Time:  Monday September 26 2023 22:50:53 EST Ventricular Rate:  121 PR Interval:  160 QRS Duration:  86 QT Interval:  310 QTC Calculation: 440 R Axis:   90  Text Interpretation: Sinus tachycardia Borderline right axis deviation Minimal ST depression, diffuse leads Confirmed by Diondra Pines ([id number]) on 09/27/2023 11:38:04 AM         Imaging Studies ordered: I ordered imaging studies including hip x-ray I independently visualized and interpreted imaging. I agree with the radiologist interpretation   Medicines ordered and prescription drug management: Meds ordered this encounter  Medications   morphine (PF) 4 MG/ML injection 4 mg   ondansetron (ZOFRAN) injection 4 mg   morphine (PF) 4 MG/ML injection 4 mg   oxyCODONE (Oxy IR/ROXICODONE) immediate release tablet 5 mg   fentaNYL (SUBLIMAZE) injection 12.5-50 mcg   polyethylene glycol (MIRALAX / GLYCOLAX) packet 17 g   OR Linked Order Group    methocarbamol (ROBAXIN) tablet 500 mg    methocarbamol (ROBAXIN) injection 500 mg   ondansetron (ZOFRAN) injection 4 mg   trimethoprim (TRIMPEX) tablet 100 mg   amLODipine (NORVASC) tablet 2.5 mg   metoprolol succinate (TOPROL-XL) 24 hr tablet 12.5 mg   ALPRAZolam (XANAX) tablet 0.5 mg   busPIRone (BUSPAR) tablet 15 mg   DULoxetine (CYMBALTA) DR capsule 60 mg   mirtazapine (REMERON) tablet 7.5 mg   OLANZapine (ZYPREXA) tablet 5 mg   traZODone (DESYREL) tablet 50 mg   levothyroxine (SYNTHROID) tablet 100 mcg   senna (SENOKOT) tablet 8.6 mg   gabapentin (NEURONTIN) capsule 200 mg   sodium chloride tablet 1 g   0.9 %  sodium chloride infusion   hydrALAZINE (APRESOLINE) tablet 25 mg   fentaNYL (SUBLIMAZE) 100 MCG/2ML injection    Kathrynn Humble T: cabinet override    -I have reviewed the patients home medicines and have made adjustments as needed  Critical interventions none  Consultations Obtained: I requested  consultation with the orthopedic surgeon on-call Dr. Carola Frost,  and discussed lab and imaging findings as well as pertinent plan - they recommend: Medicine admission, surgery   Cardiac Monitoring: The patient was maintained on a cardiac monitor.  I personally viewed and interpreted the cardiac monitored which showed an underlying rhythm of: NSR  Social Determinants of Health:  Factors impacting patients care include: Lives in assisted living   Reevaluation: After the interventions noted above, I reevaluated the patient and found that they have :stayed the same  Co morbidities that complicate the patient evaluation  Past Medical History:  Diagnosis Date   Adenomatous colon polyp    Adenomatous polyp of colon 12/03/2004   6mm   Anxiety    Chronic headaches    Chronic insomnia    Depression    Diverticulosis  External hemorrhoids    Hyperlipidemia    Hypertension    Hyperthyroidism    Hypothyroidism    Umbilical hernia       Dispostion: I considered admission for this patient, and given new hip fracture patient require hospital admission     Final Clinical Impression(s) / ED Diagnoses Final diagnoses:  Closed fracture of right hip, initial encounter [facility])     @PCDICTATION @    Glendora Score, MD 09/27/23 1138

## 2023-09-27 NOTE — Op Note (Signed)
Operative Note  Date of operation: 09/27/2023 Preoperative diagnosis: Right hip displaced subcapital femoral neck fracture Postoperative diagnosis: Same  Procedure: Right direct anterior bipolar hip hemiarthroplasty  Implants: Implant Name Type Inv. Item Serial No. Manufacturer Lot No. LRB No. Used Action  FEM STEM 12/14 TAPER SZ 4 HIP - [id number] Orthopedic Implant FEM STEM 12/14 TAPER SZ 4 HIP  [employer] [id number] Right 1 Implanted  HEAD FEM STD 28X+1.5 STRL - [id number] Hips HEAD FEM STD 28X+1.5 STRL  [employer] [id number] Right 1 Implanted  BIPOLAR PROS AML 46 - [id number] Hips BIPOLAR PROS AML 46  [employer] [id number] Right 1 Implanted   Surgeon: Vanita Panda. Magnus Ivan, MD Assistant: Rexene Edison, PA-C  Anesthesia: General Antibiotics: IV Ancef EBL: 100 cc Complications: None  Indications: The patient is an 82 year old female with some mild dementia who lives in assisted living with her husband.  She sustained a mechanical fall injuring her right hip.  She was brought to the [facility] and found to have an impacted subcapital right hip femoral neck fracture.  Orthopedic surgery was consulted to address this fracture.  She was admitted to the medicine service and presents today for definitive bipolar hemiarthroplasty to treat her displaced right hip femoral neck fracture.  Since I had operative time today I was asked if I could take over orthopedic care which I gladly agreed to do.  I did speak to her son at the bedside who is her power of attorney.  I described the risks and benefits of the surgery and why we are recommending an anterior bipolar hemiarthroplasty versus pinning.  With her dementia she would not be able to comply with nonweightbearing and the fracture is impacted and displaced enough that we believe this would provide more appropriate fixation and will allow for early weightbearing and mobilization.  We did describe the risk of acute blood  loss anemia, nerve vessel injury, fracture, infection, DVT, dislocation, implant failure and wound healing issues.  Our goals are hopefully decreased pain, improved mobility, and improved quality of life.  Procedure description: After informed consent was obtained and the appropriate right hip was marked, the patient was brought to the operating room where general anesthesia was obtained while she is on the stretcher.  Traction boots were placed on both her feet and she was next placed supine on the Hana fracture table with a perineal post and placed in both legs and inline skeletal traction vices with no traction applied.  Her right hip and pelvis were then assessed radiographically.  The right hip was prepped and draped with DuraPrep and sterile drapes.  A timeout was called and she was notified is correct patient correct right hip.  An incision was then made just inferior and posterior to the ASIS and carried slightly obliquely down the leg.  Dissection was carried down to the tensor fascia lata muscle and the tensor fascia was then divided longitudinally to proceed with a direct interposed the hip.  Circumflex vessels were identified and cauterized the hip capsule identified and opened up in L-type format finding a hemarthrosis consistent with a femoral neck fracture.  The femoral neck fracture was identified and we made a freshening cut with an oscillating saw just distal to the fracture approximate her left trochanter.  This cut was completed with an osteotome.  A corkscrew guide was placed in the femoral head and the femoral head was removed in's entirety.  There was noted cartilage loss of the acetabulum and femoral head.  We then trialed for a size 46 head.  Attention was then turned the femur.  With the right leg externally rotated to 120 degrees, extended and adducted, angular retractors placed medially and a Hohmann retractor behind the greater trochanter.  The lateral joint capsule was released and a  box cutting osteotome was used in a chronic canal.  Broaching was then initiated using the Actis broaching system from a size 0 going to a size 4.  The size 4 in place we trialed a standard offset femoral neck with a 46/28+1.5 bipolar hip ball construct.  This was introduced in the acetabulum after breaking the leg overnight.  We assessed it radiographically and clinically and felt like her leg lengths were good but we needed more offset.  We dislocated the hip remove the trial components.  We placed the real Actis femoral component size 4 with high offset and then placed the real 46/28+1.5 bipolar metal head ball.  Again this was reduced and acetabular pleased with stability as well as offset and leg length assessed mechanically and radiographically.  We then irrigated the soft tissue normal saline solution.  The joint capsule was closed with interrupted #1 Ethibond suture followed by #1 Vicryl close the tensor fascia.  0 Vicryl was used for the deep tissue and 3-0 Vicryl was used to close subcutaneous tissue.  Skin was closed staples.  An Aquacel dressing was applied.  The patient was taken off the Hana table, awakened, extubated and taken recovery room in stable condition.  Rexene Edison, PA-C did assist during entire case and beginning manage assistance was crucial and medically necessary for soft tissue management and retraction, helping guide implant placement and a layered closure of the wound.

## 2023-09-27 NOTE — Anesthesia Procedure Notes (Signed)
Procedure Name: Intubation Date/Time: 09/27/2023 2:05 PM  Performed by: Cy Blamer, CRNAPre-anesthesia Checklist: Patient identified, Emergency Drugs available, Suction available and Patient being monitored Patient Re-evaluated:Patient Re-evaluated prior to induction Oxygen Delivery Method: Circle system utilized Preoxygenation: Pre-oxygenation with 100% oxygen Induction Type: IV induction Ventilation: Mask ventilation without difficulty Laryngoscope Size: Miller and 2 Grade View: Grade II Tube type: Oral Tube size: 7.0 mm Number of attempts: 1 Airway Equipment and Method: Stylet Placement Confirmation: ETT inserted through vocal cords under direct vision, positive ETCO2 and breath sounds checked- equal and bilateral Secured at: 21 cm Tube secured with: Tape Dental Injury: Teeth and Oropharynx as per pre-operative assessment  Comments: Soft guaze bite block placed after intubation

## 2023-09-27 NOTE — Anesthesia Procedure Notes (Signed)
Anesthesia Regional Block: Femoral nerve block   Pre-Anesthetic Checklist: , timeout performed,  Correct Patient, Correct Site, Correct Laterality,  Correct Procedure, Correct Position, site marked,  Risks and benefits discussed,  Surgical consent,  Pre-op evaluation,  At surgeon's request and post-op pain management  Laterality: Lower and Right  Prep: chloraprep       Needles:  Injection technique: Single-shot  Needle Type: Stimulator Needle - 80     Needle Length: 9cm  Needle Gauge: 22   Needle insertion depth: 6 cm   Additional Needles:   Procedures:, nerve stimulator,,, ultrasound used (permanent image in chart),,     Nerve Stimulator or Paresthesia:  Response: Patellar snap, 0.5 mA  Additional Responses:   Narrative:  Start time: 09/27/2023 11:00 AM End time: 09/27/2023 11:27 AM Injection made incrementally with aspirations every 5 mL.  Performed by: Personally  Anesthesiologist: Lewie Loron, MD  Additional Notes: BP cuff, EKG monitors applied. Sedation begun. Femoral artery palpated for location of nerve. After nerve location verified with U/S, anesthetic injected incrementally, slowly, and after negative aspirations under direct u/s guidance. Good perineural spread. Patient tolerated well.

## 2023-09-27 NOTE — Anesthesia Preprocedure Evaluation (Addendum)
Anesthesia Evaluation  Patient identified by MRN, date of birth, ID band Patient awake    Reviewed: Allergy & Precautions, NPO status , Patient's Chart, lab work & pertinent test results  Airway Mallampati: II  TM Distance: >3 FB Neck ROM: Full    Dental no notable dental hx. (+) Dental Advisory Given   Pulmonary former smoker   Pulmonary exam normal breath sounds clear to auscultation       Cardiovascular hypertension, Pt. on home beta blockers Normal cardiovascular exam Rhythm:Regular Rate:Normal     Neuro/Psych  Headaches PSYCHIATRIC DISORDERS Anxiety Depression       GI/Hepatic negative GI ROS, Neg liver ROS,,,  Endo/Other  Hypothyroidism Hyperthyroidism   Renal/GU Renal disease     Musculoskeletal negative musculoskeletal ROS (+)    Abdominal   Peds  Hematology negative hematology ROS (+)   Anesthesia Other Findings   Reproductive/Obstetrics                             Anesthesia Physical Anesthesia Plan  ASA: 3  Anesthesia Plan: Regional   Post-op Pain Management:    Induction:   PONV Risk Score and Plan:   Airway Management Planned:   Additional Equipment:   Intra-op Plan:   Post-operative Plan:   Informed Consent: I have reviewed the patients History and Physical, chart, labs and discussed the procedure including the risks, benefits and alternatives for the proposed anesthesia with the patient or authorized representative who has indicated his/her understanding and acceptance.       Plan Discussed with:   Anesthesia Plan Comments:         Anesthesia Quick Evaluation

## 2023-09-28 ENCOUNTER — Encounter (HOSPITAL_COMMUNITY): Payer: Self-pay | Admitting: Orthopaedic Surgery

## 2023-09-28 DIAGNOSIS — S72001A Fracture of unspecified part of neck of right femur, initial encounter for closed fracture: Secondary | ICD-10-CM | POA: Diagnosis not present

## 2023-09-28 LAB — CBC
HCT: 30.4 % — ABNORMAL LOW (ref 36.0–46.0)
Hemoglobin: 10.2 g/dL — ABNORMAL LOW (ref 12.0–15.0)
MCH: 30.6 pg (ref 26.0–34.0)
MCHC: 33.6 g/dL (ref 30.0–36.0)
MCV: 91.3 fL (ref 80.0–100.0)
Platelets: 181 10*3/uL (ref 150–400)
RBC: 3.33 MIL/uL — ABNORMAL LOW (ref 3.87–5.11)
RDW: 12.4 % (ref 11.5–15.5)
WBC: 10.5 10*3/uL (ref 4.0–10.5)
nRBC: 0 % (ref 0.0–0.2)

## 2023-09-28 LAB — BASIC METABOLIC PANEL
Anion gap: 10 (ref 5–15)
BUN: 7 mg/dL — ABNORMAL LOW (ref 8–23)
CO2: 25 mmol/L (ref 22–32)
Calcium: 9.5 mg/dL (ref 8.9–10.3)
Chloride: 99 mmol/L (ref 98–111)
Creatinine, Ser: 0.89 mg/dL (ref 0.44–1.00)
GFR, Estimated: >60 mL/min (ref >60)
Glucose, Bld: 131 mg/dL — ABNORMAL HIGH (ref 70–99)
Potassium: 3.9 mmol/L (ref 3.5–5.1)
Sodium: 134 mmol/L — ABNORMAL LOW (ref 135–145)

## 2023-09-28 MED ORDER — MELATONIN 3 MG PO TABS
3.0000 mg | ORAL_TABLET | Freq: Every day | ORAL | Status: DC
  Administered 2023-09-28 – 2023-10-03 (×6): 3 mg via ORAL
  Filled 2023-09-28 (×6): qty 1

## 2023-09-28 MED ORDER — ENSURE ENLIVE PO LIQD
237.0000 mL | Freq: Every day | ORAL | Status: DC
  Administered 2023-09-28 – 2023-09-29 (×2): 237 mL via ORAL

## 2023-09-28 MED ORDER — ACETAMINOPHEN 500 MG PO TABS
1000.0000 mg | ORAL_TABLET | Freq: Four times a day (QID) | ORAL | Status: DC
  Administered 2023-09-28 – 2023-10-04 (×18): 1000 mg via ORAL
  Filled 2023-09-28 (×24): qty 2

## 2023-09-28 NOTE — Evaluation (Signed)
Occupational Therapy Evaluation Patient Details Name: Debbie Wells MRN: [id number] DOB: [birth year] Today's Date: 09/28/2023   History of Present Illness Pt is a 82 y.o. F who presents 09/26/2023 after a fall with right subcapital femoral neck fx s/p right direct anterior bipolar hip hemiarthroplasty. Significant PMH: severe anxiety and depression, CKD 3a.   Clinical Impression   PTA patient living at [facility], reports independent with ADLs and mobility (using RW intermittently). Admitted for above and presents with problem list below. Pt currently requires min assist for bed mobility, min assist +2 for transfers and limited mobility in room using RW, and setup to total assist for ADLs.  She is anxious throughout session and requires cueing for redirection, problem solving; anticipate cognition is near baseline. Based on performance today, believe patient will best benefit from continued OT services acutely and after dc at inpatient setting with <3hrs/day to optimize independence, safety with ADLs and mobility.      If plan is discharge home, recommend the following: A lot of help with walking and/or transfers;A lot of help with bathing/dressing/bathroom;Assist for transportation;Help with stairs or ramp for entrance    Functional Status Assessment  Patient has had a recent decline in their functional status and demonstrates the ability to make significant improvements in function in a reasonable and predictable amount of time.  Equipment Recommendations  Other (comment) (defer)    Recommendations for Other Services       Precautions / Restrictions Precautions Precautions: Fall Restrictions Weight Bearing Restrictions: No      Mobility Bed Mobility Overal bed mobility: Needs Assistance Bed Mobility: Supine to Sit     Supine to sit: Min assist     General bed mobility comments: Cues for use of bed rail, assist for RLE navigation, use of bed pad to scoot hips     Transfers Overall transfer level: Needs assistance Equipment used: Rolling walker (2 wheels) Transfers: Sit to/from Stand Sit to Stand: Min assist, +2 physical assistance           General transfer comment: MinA + 2 to boost up to standing position      Balance Overall balance assessment: Needs assistance Sitting-balance support: Feet supported Sitting balance-Leahy Scale: Good     Standing balance support: Bilateral upper extremity supported Standing balance-Leahy Scale: Poor Standing balance comment: relies on BUE support                           ADL either performed or assessed with clinical judgement   ADL Overall ADL's : Needs assistance/impaired     Grooming: Set up;Sitting           Upper Body Dressing : Sitting;Contact guard assist   Lower Body Dressing: Sit to/from stand;Total assistance Lower Body Dressing Details (indicate cue type and reason): assist for socks and relies on BUE support in standing Toilet Transfer: Minimal assistance;+2 for physical assistance;Ambulation;Rolling walker (2 wheels)           Functional mobility during ADLs: Minimal assistance;+2 for physical assistance;Rolling walker (2 wheels)       Vision   Vision Assessment?: No apparent visual deficits     Perception         Praxis         Pertinent Vitals/Pain Pain Assessment Pain Assessment: Faces Faces Pain Scale: Hurts little more Pain Location: R hip Pain Descriptors / Indicators: Operative site guarding, Grimacing Pain Intervention(s): Limited activity within patient's tolerance, Monitored  during session, Repositioned     Extremity/Trunk Assessment Upper Extremity Assessment Upper Extremity Assessment: Generalized weakness   Lower Extremity Assessment Lower Extremity Assessment: Defer to PT evaluation RLE Deficits / Details: s/p hemiarthroplasty. Hip flexion grossly 2-/5, knee extension at least 3/5, ankle dorsiflexion [facility]        Communication Communication Communication: No apparent difficulties   Cognition Arousal: Alert Behavior During Therapy: Anxious Overall Cognitive Status: No family/caregiver present to determine baseline cognitive functioning                                 General Comments: Pt with hx of anxiety and mild dementia, noted some difficulty with recall i.e. answering PLOF questions, benefits from one step commands and easily distracted     General Comments       Exercises     Shoulder Instructions      Home Living Family/patient expects to be discharged to:: Assisted living                             Home Equipment: Rolling Walker (2 wheels);Shower seat - built in;Grab bars - toilet;Grab bars - tub/shower   Additional Comments: friends home, spouse is in "another part of the campus"      Prior Functioning/Environment Prior Level of Function : Independent/Modified Independent             Mobility Comments: using RW intermittently ADLs Comments: reports standing in shower, independent ADLs; pt recieves pill packs        OT Problem List: Decreased strength;Decreased activity tolerance;Pain;Decreased knowledge of precautions;Decreased knowledge of use of DME or AE;Decreased safety awareness;Decreased cognition;Impaired balance (sitting and/or standing)      OT Treatment/Interventions: Self-care/ADL training;Therapeutic exercise;DME and/or AE instruction;Therapeutic activities;Balance training;Patient/family education;Cognitive remediation/compensation    OT Goals(Current goals can be found in the care plan section) Acute Rehab OT Goals Patient Stated Goal: feel better OT Goal Formulation: With patient Time For Goal Achievement: 10/12/23 Potential to Achieve Goals: Good  OT Frequency: Min 1X/week    Co-evaluation PT/OT/SLP Co-Evaluation/Treatment: Yes Reason for Co-Treatment: For patient/therapist safety;To address functional/ADL  transfers PT goals addressed during session: Mobility/safety with mobility OT goals addressed during session: ADL's and self-care      AM-PAC OT "6 Clicks" Daily Activity     Outcome Measure Help from another person eating meals?: None Help from another person taking care of personal grooming?: A Little Help from another person toileting, which includes using toliet, bedpan, or urinal?: A Lot Help from another person bathing (including washing, rinsing, drying)?: A Lot Help from another person to put on and taking off regular upper body clothing?: A Little Help from another person to put on and taking off regular lower body clothing?: A Lot 6 Click Score: 16   End of Session Equipment Utilized During Treatment: Gait belt;Rolling walker (2 wheels) Nurse Communication: Mobility status  Activity Tolerance: Patient tolerated treatment well Patient left: in chair;with call bell/phone within reach;with chair alarm set  OT Visit Diagnosis: Other abnormalities of gait and mobility (R26.89);Muscle weakness (generalized) (M62.81);Pain;History of falling (Z91.81) Pain - Right/Left: Right Pain - part of body: Hip                Time: 1308-6578 OT Time Calculation (min): 27 min Charges:  OT General Charges $OT Visit: 1 Visit OT Evaluation $OT Eval Moderate Complexity: 1 Mod  Shaindy Reader B,  OT Acute Rehabilitation Services Office [phone]   Chancy Milroy 09/28/2023, 1:41 PM

## 2023-09-28 NOTE — Progress Notes (Signed)
Patient ID: DNAE KURMAN, female   DOB: [birth year], 82 y.o.   MRN: [id number] The patient is sitting in a bedside chair.  She is awake and alert and does follow commands appropriately.  She does report some confusion and I do know that she has mild dementia at baseline.  She did tolerate surgery well on her right hip with her hemiarthroplasty secondary to her femoral neck fracture.  From her orthopedic standpoint she can weight-bear as tolerated.  This was performed through an anterior approach so we have no hip precautions.  She is in assisted living at [facility] and they do have a skilled nursing section which I am fine with her being discharged to when she is clinically and medically ready.  I would just recommend a baby aspirin twice daily for DVT coverage and keeping pain medication to a minimum.  I will continue to follow her while she is here.

## 2023-09-28 NOTE — Care Management Important Message (Signed)
Important Message  Patient Details  Name: Debbie Wells MRN: [id number] Date of Birth: 1941/08/01   Important Message Given:  Yes - Medicare IM     Sherilyn Banker 09/28/2023, 1:54 PM

## 2023-09-28 NOTE — Anesthesia Postprocedure Evaluation (Signed)
Anesthesia Post Note  Patient: Debbie Wells  Procedure(s) Performed: AN AD HOC NERVE BLOCK     Patient location during evaluation: PACU Anesthesia Type: Regional Level of consciousness: awake and alert Pain management: pain level controlled Vital Signs Assessment: post-procedure vital signs reviewed and stable Respiratory status: spontaneous breathing Cardiovascular status: stable Anesthetic complications: no   No notable events documented.  Last Vitals:  Vitals:   09/28/23 0738 09/28/23 1405  BP: (!) 155/74 (!) 145/68  Pulse: 94 86  Resp: 18 18  Temp: 36.8 C 36.9 C  SpO2: 99% 98%    Last Pain:  Vitals:   09/28/23 1405  TempSrc: Oral  PainSc:                  Lewie Loron

## 2023-09-28 NOTE — Evaluation (Signed)
Physical Therapy Evaluation Patient Details Name: Debbie Wells MRN: [id number] DOB: 05-Aug-1941 Today's Date: 09/28/2023  History of Present Illness  Pt is a 82 y.o. F who presents 09/26/2023 after a fall with right subcapital femoral neck fx s/p right direct anterior bipolar hip hemiarthroplasty. Significant PMH: severe anxiety and depression, CKD 3a.  Clinical Impression  PTA, pt lives at [facility] and is independent with mobility, using RW intermittently. Pt presents with decreased functional mobility secondary to RLE weakness, pain, impaired standing balance, gait abnormalities. Pt requiring two person minimal assist for transfers and limited ambulation in room using RW. Would benefit from short term post acute rehab < 3 hours/day to address deficits and maximize functional mobility prior to return to ALF. Will continue to follow acutely.        If plan is discharge home, recommend the following: A little help with walking and/or transfers;A little help with bathing/dressing/bathroom;Assistance with cooking/housework;Assist for transportation;Help with stairs or ramp for entrance   Can travel by private vehicle   Yes    Equipment Recommendations Rolling walker (2 wheels);BSC/3in1  Recommendations for Other Services       Functional Status Assessment Patient has had a recent decline in their functional status and demonstrates the ability to make significant improvements in function in a reasonable and predictable amount of time.     Precautions / Restrictions Precautions Precautions: Fall Restrictions Weight Bearing Restrictions: No      Mobility  Bed Mobility Overal bed mobility: Needs Assistance Bed Mobility: Supine to Sit     Supine to sit: Min assist     General bed mobility comments: Cues for use of bed rail, assist for RLE navigation, use of bed pad to scoot hips    Transfers Overall transfer level: Needs assistance Equipment used: Rolling walker (2  wheels) Transfers: Sit to/from Stand Sit to Stand: Min assist, +2 physical assistance           General transfer comment: MinA + 2 to boost up to standing position    Ambulation/Gait Ambulation/Gait assistance: Min assist, +2 physical assistance Gait Distance (Feet): 10 Feet Assistive device: Rolling walker (2 wheels) Gait Pattern/deviations: Step-to pattern, Decreased stance time - right, Decreased weight shift to right, Antalgic Gait velocity: decreased Gait velocity interpretation: <1.31 ft/sec, indicative of household ambulator   General Gait Details: Slowed, step to pattern with cues provided for sequencing/technique  Stairs            Wheelchair Mobility     Tilt Bed    Modified Rankin (Stroke Patients Only)       Balance Overall balance assessment: Needs assistance Sitting-balance support: Feet supported Sitting balance-Leahy Scale: Good     Standing balance support: Bilateral upper extremity supported Standing balance-Leahy Scale: Poor                               Pertinent Vitals/Pain Pain Assessment Pain Assessment: Faces Faces Pain Scale: Hurts little more Pain Location: R hip Pain Descriptors / Indicators: Operative site guarding, Grimacing Pain Intervention(s): Limited activity within patient's tolerance, Monitored during session, Premedicated before session    Home Living Family/patient expects to be discharged to:: Assisted living                 Home Equipment: Agricultural consultant (2 wheels);Shower seat - built in;Grab bars - toilet;Grab bars - tub/shower Additional Comments: friends home, spouse is in "another part of the campus"  Prior Function Prior Level of Function : Independent/Modified Independent             Mobility Comments: using RW intermittently ADLs Comments: reports standing in shower, independent ADLs; pt recieves pill packs     Extremity/Trunk Assessment   Upper Extremity Assessment Upper  Extremity Assessment: Defer to OT evaluation    Lower Extremity Assessment Lower Extremity Assessment: RLE deficits/detail RLE Deficits / Details: s/p hemiarthroplasty. Hip flexion grossly 2-/5, knee extension at least 3/5, ankle dorsiflexion [facility]       Communication   Communication Communication: No apparent difficulties  Cognition Arousal: Alert Behavior During Therapy: Anxious Overall Cognitive Status: No family/caregiver present to determine baseline cognitive functioning                                 General Comments: Pt with hx of anxiety and mild dementia, noted some difficulty with recall i.e. answering PLOF questions, benefits from one step commands        General Comments      Exercises     Assessment/Plan    PT Assessment Patient needs continued PT services  PT Problem List Decreased strength;Decreased activity tolerance;Decreased balance;Decreased mobility;Pain       PT Treatment Interventions DME instruction;Gait training;Functional mobility training;Therapeutic exercise;Therapeutic activities;Balance training;Patient/family education    PT Goals (Current goals can be found in the Care Plan section)  Acute Rehab PT Goals Patient Stated Goal: did not state PT Goal Formulation: With patient Time For Goal Achievement: 10/12/23 Potential to Achieve Goals: Good    Frequency Min 1X/week     Co-evaluation PT/OT/SLP Co-Evaluation/Treatment: Yes Reason for Co-Treatment: For patient/therapist safety;To address functional/ADL transfers PT goals addressed during session: Mobility/safety with mobility         AM-PAC PT "6 Clicks" Mobility  Outcome Measure Help needed turning from your back to your side while in a flat bed without using bedrails?: A Little Help needed moving from lying on your back to sitting on the side of a flat bed without using bedrails?: A Little Help needed moving to and from a bed to a chair (including a wheelchair)?: A  Little Help needed standing up from a chair using your arms (e.g., wheelchair or bedside chair)?: A Little Help needed to walk in hospital room?: A Little Help needed climbing 3-5 steps with a railing? : Total 6 Click Score: 16    End of Session Equipment Utilized During Treatment: Gait belt Activity Tolerance: Patient tolerated treatment well Patient left: in chair;with call bell/phone within reach;with chair alarm set Nurse Communication: Mobility status PT Visit Diagnosis: Other abnormalities of gait and mobility (R26.89);Unsteadiness on feet (R26.81);History of falling (Z91.81);Pain Pain - Right/Left: Right Pain - part of body: Hip    Time: 9811-9147 PT Time Calculation (min) (ACUTE ONLY): 26 min   Charges:   PT Evaluation $PT Eval Low Complexity: 1 Low   PT General Charges $$ ACUTE PT VISIT: 1 Visit         Lillia Pauls, PT, DPT Acute Rehabilitation Services Office [phone]   [name] 09/28/2023, 1:03 PM

## 2023-09-28 NOTE — Progress Notes (Signed)
[facility]  PROGRESS NOTE  Debbie Wells ZOX:[id number] DOB: Apr 16, 1941 DOA: 09/26/2023 PCP: Mahlon Gammon, MD   Brief HPI:   82 y.o. female with medical history significant for depression, anxiety, insomnia, hypothyroidism, and hypertension who presents with severe right hip pain after a fall.  Upon arrival to the ED,   Plain radiographs demonstrate acute impacted subcapital right femoral neck fracture.  S/p surgery on 11/26.       Assessment/Plan:   Right hip displaced subcapital femoral neck fracture -Underwent right hip hemiarthroplasty 11/26 -Continue pain control, supportive care -DVT prophylaxis with aspirin   Hyponatremia  -Improved with salt tablets   Hypertension  - Continue amlodipine and Toprol  -Continue hydralazine as needed   Depression, anxiety, insomnia  - Continue Buspar, Remeron, Cymbalta, Zyprexa, trazodone, and as-needed Xanax     Hypothyroidism  - Synthroid      CKD 3A  -AKI resolved.   Recurrent UTIs -Continue suppressive therapy with trimethoprim   Medications     acetaminophen  1,000 mg Oral QID   amLODipine  2.5 mg Oral Daily   aspirin  81 mg Oral BID   busPIRone  15 mg Oral BID   docusate sodium  100 mg Oral BID   DULoxetine  60 mg Oral Daily   gabapentin  200 mg Oral BID   levothyroxine  100 mcg Oral Q0600   melatonin  3 mg Oral QHS   metoprolol succinate  12.5 mg Oral Daily   mirtazapine  7.5 mg Oral QHS   OLANZapine  5 mg Oral Daily   senna  1 tablet Oral Daily   sodium chloride flush  10 mL Intravenous Q12H   sodium chloride  1 g Oral TID   trimethoprim  100 mg Oral Daily     Data Reviewed:   CBG:  No results for input(s): "GLUCAP" in the last 168 hours.  SpO2: 99 % O2 Flow Rate (L/min): 0 L/min    Vitals:   09/27/23 2026 09/28/23 0007 09/28/23 0409 09/28/23 0738  BP: 133/69 126/75 (!) 152/74 (!) 155/74  Pulse: 95 87 98 94  Resp: 18 17 16 18   Temp: 98 F (36.7 C) 97.7 F (36.5 C) 98.3 F (36.8 C)  98.2 F (36.8 C)  TempSrc: Oral Oral Oral Oral  SpO2: 98% 99% 100% 99%  Weight:      Height:          Data Reviewed:  Basic Metabolic Panel: Recent Labs  Lab 09/23/23 0000 09/26/23 2043 09/27/23 0607 09/28/23 0613  NA 125* 133* 133* 134*  K 4.5 3.7 4.2 3.9  CL 92* 103 103 99  CO2 27* 24 23 25   GLUCOSE  --  133* 115* 131*  BUN 15 8 9  7*  CREATININE 1.0 1.07* 1.41* 0.89  CALCIUM 9.1 9.4 9.2 9.5    CBC: Recent Labs  Lab 09/23/23 0000 09/26/23 2043 09/27/23 0607 09/28/23 0613  WBC 5.3 5.2 10.5 10.5  NEUTROABS 3,058.00 3.5  --   --   HGB 11.1* 11.8* 10.9* 10.2*  HCT 33* 36.3 33.0* 30.4*  MCV  --  96.0 92.7 91.3  PLT 223 216 190 181    LFT Recent Labs  Lab 09/23/23 0000 09/26/23 2043  AST 19 29  ALT 14 23  ALKPHOS 76 82  BILITOT  --  0.7  PROT  --  6.6  ALBUMIN 3.7 3.6     Antibiotics: Anti-infectives (From admission, onward)    Start  Dose/Rate Route Frequency Ordered Stop   09/27/23 2000  ceFAZolin (ANCEF) IVPB 1 g/50 mL premix        1 g 100 mL/hr over 30 Minutes Intravenous Every 6 hours 09/27/23 1710 09/28/23 0153   09/27/23 1000  trimethoprim (TRIMPEX) tablet 100 mg        100 mg Oral Daily 09/26/23 2235          DVT prophylaxis: Aspirin started per Ortho  Code Status: Full code  Family Communication: no family at bedside   CONSULTS orthopedics   Subjective   Confused and anxious   Objective    Physical Examination:    General: Appearance:    elderly female in no acute distress     Lungs:     respirations unlabored  Heart:    Normal heart rate. Normal rhythm. No murmurs, rubs, or gallops.   MS:   All extremities are intact.   Neurologic:   Awake, alert     Status is: Inpatient:             Joseph Art   [facility] If 7PM-7AM, please contact night-coverage at [email or web address], Office  [phone]   09/28/2023, 11:17 AM  LOS: 2 days

## 2023-09-28 NOTE — Progress Notes (Signed)
Bladder scan volume showed 635 ml.   MD aware and order received to do a In and out cath.   In and out cath done with Sharlene Dory, RN using sterile technique. Urine output of  650 ml which is yellow/straw color. Patient tolerated procedure well. Will continue to monitor.

## 2023-09-29 DIAGNOSIS — S72001A Fracture of unspecified part of neck of right femur, initial encounter for closed fracture: Secondary | ICD-10-CM | POA: Diagnosis not present

## 2023-09-29 LAB — CBC
HCT: 27.6 % — ABNORMAL LOW (ref 36.0–46.0)
Hemoglobin: 9.3 g/dL — ABNORMAL LOW (ref 12.0–15.0)
MCH: 31.1 pg (ref 26.0–34.0)
MCHC: 33.7 g/dL (ref 30.0–36.0)
MCV: 92.3 fL (ref 80.0–100.0)
Platelets: 172 10*3/uL (ref 150–400)
RBC: 2.99 MIL/uL — ABNORMAL LOW (ref 3.87–5.11)
RDW: 12.7 % (ref 11.5–15.5)
WBC: 9.9 10*3/uL (ref 4.0–10.5)
nRBC: 0 % (ref 0.0–0.2)

## 2023-09-29 LAB — BASIC METABOLIC PANEL
Anion gap: 3 — ABNORMAL LOW (ref 5–15)
BUN: 18 mg/dL (ref 8–23)
CO2: 25 mmol/L (ref 22–32)
Calcium: 8.7 mg/dL — ABNORMAL LOW (ref 8.9–10.3)
Chloride: 104 mmol/L (ref 98–111)
Creatinine, Ser: 1 mg/dL (ref 0.44–1.00)
GFR, Estimated: 57 mL/min — ABNORMAL LOW (ref >60)
Glucose, Bld: 89 mg/dL (ref 70–99)
Potassium: 3.9 mmol/L (ref 3.5–5.1)
Sodium: 132 mmol/L — ABNORMAL LOW (ref 135–145)

## 2023-09-29 MED ORDER — ASPIRIN 81 MG PO CHEW: 81.0000 mg | CHEWABLE_TABLET | Freq: Two times a day (BID) | ORAL | 0 refills | Status: DC

## 2023-09-29 MED ORDER — LOSARTAN POTASSIUM 50 MG PO TABS
50.0000 mg | ORAL_TABLET | Freq: Two times a day (BID) | ORAL | Status: DC
  Administered 2023-09-29 – 2023-10-04 (×11): 50 mg via ORAL
  Filled 2023-09-29 (×11): qty 1

## 2023-09-29 MED ORDER — POLYETHYLENE GLYCOL 3350 17 G PO PACK
17.0000 g | PACK | Freq: Every day | ORAL | Status: DC
  Administered 2023-09-29 – 2023-09-30 (×2): 17 g via ORAL
  Filled 2023-09-29 (×2): qty 1

## 2023-09-29 MED ORDER — SACCHAROMYCES BOULARDII 250 MG PO CAPS
250.0000 mg | ORAL_CAPSULE | Freq: Two times a day (BID) | ORAL | Status: DC
  Administered 2023-09-29 – 2023-10-04 (×10): 250 mg via ORAL
  Filled 2023-09-29 (×11): qty 1

## 2023-09-29 MED ORDER — TRAMADOL HCL 50 MG PO TABS: 50.0000 mg | ORAL_TABLET | Freq: Four times a day (QID) | ORAL | 0 refills | Status: DC | PRN

## 2023-09-29 NOTE — Plan of Care (Signed)
  Problem: Education: Goal: Knowledge of General Education information will improve Description: Including pain rating scale, medication(s)/side effects and non-pharmacologic comfort measures Outcome: Progressing   Problem: Health Behavior/Discharge Planning: Goal: Ability to manage health-related needs will improve Outcome: Progressing   Problem: Clinical Measurements: Goal: Ability to maintain clinical measurements within normal limits will improve Outcome: Progressing   Problem: Activity: Goal: Risk for activity intolerance will decrease Outcome: Progressing   Problem: Nutrition: Goal: Adequate nutrition will be maintained Outcome: Progressing   Problem: Elimination: Goal: Will not experience complications related to bowel motility Outcome: Progressing Goal: Will not experience complications related to urinary retention Outcome: Progressing   Problem: Pain Management: Goal: General experience of comfort will improve Outcome: Progressing   Problem: Safety: Goal: Ability to remain free from injury will improve Outcome: Progressing   Problem: Skin Integrity: Goal: Risk for impaired skin integrity will decrease Outcome: Progressing

## 2023-09-29 NOTE — Plan of Care (Signed)

## 2023-09-29 NOTE — Progress Notes (Signed)
Patient ID: Debbie Wells, female   DOB: Aug 30, 1941, 82 y.o.   MRN: [id number] The patient's vital signs are stable.  Her right operative hip is stable.  I did change the dressing and the incision is clean and dry.  She is awake and alert and likely at her baseline in terms of her dementia.  Therapy was able to work with her yesterday.  She does stay in assisted living at [facility] which does have a skilled nursing section.  Her husband is actually in the skilled nursing section right now.  From a standpoint of orthopedics, she can be transition to SNF when available.

## 2023-09-29 NOTE — [place] FL2 (Signed)
[place] MEDICAID FL2 LEVEL OF CARE FORM     IDENTIFICATION  Patient Name: Debbie Wells Birthdate: 1941-03-13 Sex: female Admission Date (Current Location): 09/26/2023  [facility] and [place] Number:  Producer, television/film/video and Address:  [facility] [place], [address] [place], [place] [address]      Provider Number: [id number]  Attending Physician Name and Address:  Joseph Art, DO  Relative Name and Phone Number:  Clydia, Durfey)  [phone]    Current Level of Care: Hospital Recommended Level of Care: Skilled Nursing Facility Prior Approval Number:    Date Approved/Denied:   PASRR Number: [id number]  Discharge Plan: SNF    Current Diagnoses: Patient Active Problem List   Diagnosis Date Noted   Closed right hip fracture, initial encounter (HCC) 09/26/2023   Hypothyroidism    Anxiety    Hyponatremia 06/11/2023   Hardening of the aorta (main artery of the heart) (HCC) 12/22/2020   Moderate recurrent major depression (HCC) 12/22/2020   Recurrent urinary tract infection 12/22/2020   History of recurrent UTIs 10/03/2020   Hyperlipidemia 10/03/2020   Major depressive disorder, recurrent severe without psychotic features (HCC) 10/03/2020   Hereditary and idiopathic neuropathy, unspecified 05/06/2020   Chronic insomnia 03/20/2020   Chronic kidney disease due to hypertension 01/08/2020   Chronic kidney disease, stage 3a (HCC) 01/07/2020   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/2018   Insomnia due to medical condition 10/12/2018   Hyperthyroidism determined by thyroid function test 10/12/2018   Hypertension due to endocrine disorder 10/12/2018   Tremor observed on examination 10/12/2018   Hypo-osmolality and hyponatremia 09/11/2018   Impaired fasting glucose 09/11/2018   Migraine 09/11/2018   Chronic constipation 06/19/2018   Maxillary sinus cyst 12/14/2017   Referred otalgia of left ear 12/14/2017   Tinnitus of left ear  03/03/2017   Cramp in lower leg associated with rest 05/31/2016   IBS (irritable bowel syndrome) 03/12/2014   History of colonic polyps 02/12/2013   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 12/05/2012   Pure hypercholesterolemia 12/05/2012   Umbilical hernia 09/11/2012   Chronic  upper abdominal pain 06/24/2012   Anxiety about health 05/26/2010   Polyp of colon 02/10/2010   Essential (primary) hypertension 12/30/2009   Major depression, single episode 12/30/2009   Palpitations 12/30/2009   Vitamin D deficiency 12/30/2009   HEMORRHOIDS 07/22/2008    Orientation RESPIRATION BLADDER Height & Weight     Place, Self  Normal Continent, External catheter Weight: 130 lb 1.1 oz (59 kg) Height:  5\' 4"  (162.6 cm)  BEHAVIORAL SYMPTOMS/MOOD NEUROLOGICAL BOWEL NUTRITION STATUS      Continent Diet (see dc summary)  AMBULATORY STATUS COMMUNICATION OF NEEDS Skin   Extensive Assist Verbally Surgical wounds (Closed incision 09/27/23 Right Hip)                       Personal Care Assistance Level of Assistance  Bathing, Feeding, Dressing Bathing Assistance: Limited assistance Feeding assistance: Limited assistance Dressing Assistance: Limited assistance     Functional Limitations Info  Sight, Hearing, Speech Sight Info: Impaired Hearing Info: Adequate Speech Info: Adequate    SPECIAL CARE FACTORS FREQUENCY  PT (By licensed PT), OT (By licensed OT)     PT Frequency: 5x week OT Frequency: 5x week            Contractures      Additional Factors Info  Code Status, Allergies, Psychotropic Code Status Info: full  Allergies Info: Hydrocodone-acetaminophen  Iodinated Contrast Media  Sulfa Antibiotics  Codeine  Sulfonamide Derivatives  Terbinafine  Benazepril Psychotropic Info: busPIRone (BUSPAR) tablet 15 mg 2x daily  gabapentin (NEURONTIN) capsule 200 mg 2x daily  OLANZapine (ZYPREXA) tablet 5 mg daily         Current Medications (09/29/2023):  This  is the current hospital active medication list Current Facility-Administered Medications  Medication Dose Route Frequency Provider Last Rate Last Admin   acetaminophen (TYLENOL) tablet 1,000 mg  1,000 mg Oral QID Vann, [name] U, DO   1,000 mg at 09/29/23 1402   ALPRAZolam (XANAX) tablet 0.5 mg  0.5 mg Oral TID PRN Kathryne Hitch, MD   0.5 mg at 09/28/23 1955   amLODipine (NORVASC) tablet 2.5 mg  2.5 mg Oral Daily Kathryne Hitch, MD   2.5 mg at 09/29/23 5784   aspirin chewable tablet 81 mg  81 mg Oral BID Kathryne Hitch, MD   81 mg at 09/29/23 0913   busPIRone (BUSPAR) tablet 15 mg  15 mg Oral BID Kathryne Hitch, MD   15 mg at 09/29/23 6962   diphenhydrAMINE (BENADRYL) 12.5 MG/5ML elixir 12.5-25 mg  12.5-25 mg Oral Q4H PRN Kathryne Hitch, MD       docusate sodium (COLACE) capsule 100 mg  100 mg Oral BID Kathryne Hitch, MD   100 mg at 09/29/23 0912   DULoxetine (CYMBALTA) DR capsule 60 mg  60 mg Oral Daily Kathryne Hitch, MD   60 mg at 09/29/23 0913   feeding supplement (ENSURE ENLIVE / ENSURE PLUS) liquid 237 mL  237 mL Oral Q supper Marlin Canary U, DO   237 mL at 09/28/23 1616   gabapentin (NEURONTIN) capsule 200 mg  200 mg Oral BID Kathryne Hitch, MD   200 mg at 09/29/23 0913   hydrALAZINE (APRESOLINE) tablet 25 mg  25 mg Oral Q6H PRN Kathryne Hitch, MD   25 mg at 09/27/23 0027   levothyroxine (SYNTHROID) tablet 100 mcg  100 mcg Oral Q0600 Kathryne Hitch, MD   100 mcg at 09/29/23 0606   losartan (COZAAR) tablet 50 mg  50 mg Oral BID Marlin Canary U, DO   50 mg at 09/29/23 1101   melatonin tablet 3 mg  3 mg Oral QHS Vann, [name] U, DO   3 mg at 09/28/23 2041   menthol-cetylpyridinium (CEPACOL) lozenge 3 mg  1 lozenge Oral PRN Kathryne Hitch, MD       Or   phenol (CHLORASEPTIC) mouth spray 1 spray  1 spray Mouth/Throat PRN Kathryne Hitch, MD       methocarbamol (ROBAXIN) tablet 500 mg   500 mg Oral Q6H PRN Kathryne Hitch, MD   500 mg at 09/28/23 1954   Or   methocarbamol (ROBAXIN) injection 500 mg  500 mg Intravenous Q6H PRN Kathryne Hitch, MD       metoCLOPramide (REGLAN) tablet 5-10 mg  5-10 mg Oral Q8H PRN Kathryne Hitch, MD       Or   metoCLOPramide (REGLAN) injection 5-10 mg  5-10 mg Intravenous Q8H PRN Kathryne Hitch, MD       metoprolol succinate (TOPROL-XL) 24 hr tablet 12.5 mg  12.5 mg Oral Daily Kathryne Hitch, MD   12.5 mg at 09/29/23 0913   mirtazapine (REMERON) tablet 7.5 mg  7.5 mg Oral QHS Kathryne Hitch, MD   7.5 mg at 09/27/23 2107   morphine (PF) 2  MG/ML injection 0.5-1 mg  0.5-1 mg Intravenous Q2H PRN Kathryne Hitch, MD   1 mg at 09/27/23 2218   OLANZapine (ZYPREXA) tablet 5 mg  5 mg Oral Daily Kathryne Hitch, MD   5 mg at 09/29/23 0913   ondansetron (ZOFRAN) injection 4 mg  4 mg Intravenous Q6H PRN Kathryne Hitch, MD       oxyCODONE-acetaminophen (PERCOCET) 7.5-325 MG per tablet 1 tablet  1 tablet Oral Q4H PRN [name], MD   1 tablet at 09/27/23 2107   polyethylene glycol (MIRALAX / GLYCOLAX) packet 17 g  17 g Oral Daily PRN Kathryne Hitch, MD       polyethylene glycol (MIRALAX / GLYCOLAX) packet 17 g  17 g Oral Daily Marlin Canary U, DO   17 g at 09/29/23 1101   saccharomyces boulardii (FLORASTOR) capsule 250 mg  250 mg Oral BID Vann, [name] U, DO   250 mg at 09/29/23 1101   senna (SENOKOT) tablet 8.6 mg  1 tablet Oral Daily Kathryne Hitch, MD   8.6 mg at 09/29/23 0913   sodium chloride flush (NS) 0.9 % injection 10 mL  10 mL Intravenous Q12H Kathryne Hitch, MD   10 mL at 09/28/23 5366   sodium chloride tablet 1 g  1 g Oral TID [name], MD   1 g at 09/28/23 2041   traMADol (ULTRAM) tablet 50 mg  50 mg Oral Q6H PRN Kathryne Hitch, MD       traZODone (DESYREL) tablet 50 mg  50 mg Oral QHS PRN Kathryne Hitch, MD   50 mg at  09/27/23 2224   trimethoprim (TRIMPEX) tablet 100 mg  100 mg Oral Daily Kathryne Hitch, MD   100 mg at 09/29/23 4403     Discharge Medications: Please see discharge summary for a list of discharge medications.  Relevant Imaging Results:  Relevant Lab Results:   Additional Information SSN [id number]  Carmina Miller, LCSWA

## 2023-09-29 NOTE — Progress Notes (Signed)
[facility]  PROGRESS NOTE  Debbie Wells ZOX:[id number] DOB: 1941-03-14 DOA: 09/26/2023 PCP: Mahlon Gammon, MD   Brief HPI:   82 y.o. female with medical history significant for depression, anxiety, insomnia, hypothyroidism, and hypertension who presents with severe right hip pain after a fall.  Upon arrival to the ED,   Plain radiographs demonstrate acute impacted subcapital right femoral neck fracture.  S/p surgery on 11/26.  Hospital stay complicated by delirium/anxiety.       Assessment/Plan:   Right hip displaced subcapital femoral neck fracture -Underwent right hip hemiarthroplasty 11/26 -Continue pain control, supportive care -DVT prophylaxis with aspirin   Hyponatremia  -stable with salt tablets   Hypertension  - Continue amlodipine and Toprol  -Continue hydralazine as needed   Depression, anxiety, insomnia  - Continue Buspar, Remeron, Cymbalta, Zyprexa, trazodone, and as-needed Xanax   -follows with Dr. Chales Abrahams-- recent medication changes about 1 week ago   Hypothyroidism  - Synthroid      CKD 3A  -AKI resolved.   Recurrent UTIs -Continue suppressive therapy with trimethoprim   Medications     acetaminophen  1,000 mg Oral QID   amLODipine  2.5 mg Oral Daily   aspirin  81 mg Oral BID   busPIRone  15 mg Oral BID   docusate sodium  100 mg Oral BID   DULoxetine  60 mg Oral Daily   feeding supplement  237 mL Oral Q supper   gabapentin  200 mg Oral BID   levothyroxine  100 mcg Oral Q0600   losartan  50 mg Oral BID   melatonin  3 mg Oral QHS   metoprolol succinate  12.5 mg Oral Daily   mirtazapine  7.5 mg Oral QHS   OLANZapine  5 mg Oral Daily   polyethylene glycol  17 g Oral Daily   saccharomyces boulardii  250 mg Oral BID   senna  1 tablet Oral Daily   sodium chloride flush  10 mL Intravenous Q12H   sodium chloride  1 g Oral TID   trimethoprim  100 mg Oral Daily     Data Reviewed:   CBG:  No results for input(s): "GLUCAP" in the last 168  hours.  SpO2: 93 % O2 Flow Rate (L/min): 0 L/min    Vitals:   09/28/23 1405 09/28/23 2048 09/29/23 0514 09/29/23 0913  BP: (!) 145/68 (!) 142/70 (!) 150/68 (!) 156/67  Pulse: 86 (!) 101 (!) 102 (!) 101  Resp: 18 19 18 18   Temp: 98.4 F (36.9 C) 98.7 F (37.1 C) 98.7 F (37.1 C) 98.4 F (36.9 C)  TempSrc: Oral Oral Oral Oral  SpO2: 98% 95% 93% 93%  Weight:      Height:          Data Reviewed:  Basic Metabolic Panel: Recent Labs  Lab 09/23/23 0000 09/26/23 2043 09/27/23 0607 09/28/23 0613 09/29/23 0524  NA 125* 133* 133* 134* 132*  K 4.5 3.7 4.2 3.9 3.9  CL 92* 103 103 99 104  CO2 27* 24 23 25 25   GLUCOSE  --  133* 115* 131* 89  BUN 15 8 9  7* 18  CREATININE 1.0 1.07* 1.41* 0.89 1.00  CALCIUM 9.1 9.4 9.2 9.5 8.7*    CBC: Recent Labs  Lab 09/23/23 0000 09/26/23 2043 09/27/23 0607 09/28/23 0613 09/29/23 0524  WBC 5.3 5.2 10.5 10.5 9.9  NEUTROABS 3,058.00 3.5  --   --   --   HGB 11.1* 11.8* 10.9* 10.2* 9.3*  HCT  33* 36.3 33.0* 30.4* 27.6*  MCV  --  96.0 92.7 91.3 92.3  PLT 223 216 190 181 172    LFT Recent Labs  Lab 09/23/23 0000 09/26/23 2043  AST 19 29  ALT 14 23  ALKPHOS 76 82  BILITOT  --  0.7  PROT  --  6.6  ALBUMIN 3.7 3.6       DVT prophylaxis: Aspirin started per Ortho  Code Status: Full code  Family Communication: called son   CONSULTS orthopedics   Subjective   Confused and anxious   Objective    Physical Examination:    General: Appearance:    Well developed, well nourished female in no acute distress     Lungs:     respirations unlabored  Heart:    Tachycardic.   MS:   All extremities are intact.   Neurologic:   Awake, alert     Status is: Inpatient:             Joseph Art   [facility] If 7PM-7AM, please contact night-coverage at [email or web address], Office  [phone]   09/29/2023, 10:27 AM  LOS: 3 days

## 2023-09-29 NOTE — TOC Progression Note (Cosign Needed)
Transition of Care [facility]) - Progression Note    Patient Details  Name: CATHAY LIPE MRN: [id number] Date of Birth: Feb 24, 1941  [facility]) CM/SW Contact  Carmina Miller, [place] Phone Number: 09/29/2023, 2:18 PM  Clinical Narrative:     RE: Ariella Laubenthal  Date of Birth: 07/24/1941 Date: 09/29/23  Please be advised that the above-named patient will require a short-term nursing home stay - anticipated 30 days or less for rehabilitation and strengthening.  The plan is for return home.         Expected Discharge Plan and Services                                               Social Determinants of Health (SDOH) Interventions SDOH Screenings   Food Insecurity: No Food Insecurity (09/27/2023)  Housing: Low Risk  (09/27/2023)  Transportation Needs: No Transportation Needs (09/27/2023)  Utilities: Not At Risk (09/27/2023)  Depression (PHQ2-9): Low Risk  (03/17/2023)  Financial Resource Strain: Low Risk  (10/03/2020)   Received from [facility], [facility]  Social Connections: Unknown (03/16/2022)   Received from [facility], [facility]  Stress: Stress Concern Present (10/03/2020)   Received from [facility], [facility]  Tobacco Use: Medium Risk (09/27/2023)    Readmission Risk Interventions     No data to display

## 2023-09-30 DIAGNOSIS — S72001A Fracture of unspecified part of neck of right femur, initial encounter for closed fracture: Secondary | ICD-10-CM | POA: Diagnosis not present

## 2023-09-30 LAB — BASIC METABOLIC PANEL
Anion gap: 10 (ref 5–15)
BUN: 16 mg/dL (ref 8–23)
CO2: 23 mmol/L (ref 22–32)
Calcium: 9 mg/dL (ref 8.9–10.3)
Chloride: 101 mmol/L (ref 98–111)
Creatinine, Ser: 0.79 mg/dL (ref 0.44–1.00)
GFR, Estimated: >60 mL/min (ref >60)
Glucose, Bld: 89 mg/dL (ref 70–99)
Potassium: 3.6 mmol/L (ref 3.5–5.1)
Sodium: 134 mmol/L — ABNORMAL LOW (ref 135–145)

## 2023-09-30 LAB — CBC
HCT: 30 % — ABNORMAL LOW (ref 36.0–46.0)
Hemoglobin: 10.2 g/dL — ABNORMAL LOW (ref 12.0–15.0)
MCH: 31.2 pg (ref 26.0–34.0)
MCHC: 34 g/dL (ref 30.0–36.0)
MCV: 91.7 fL (ref 80.0–100.0)
Platelets: 192 10*3/uL (ref 150–400)
RBC: 3.27 MIL/uL — ABNORMAL LOW (ref 3.87–5.11)
RDW: 12.7 % (ref 11.5–15.5)
WBC: 8.5 10*3/uL (ref 4.0–10.5)
nRBC: 0 % (ref 0.0–0.2)

## 2023-09-30 MED ORDER — ENSURE ENLIVE PO LIQD
237.0000 mL | Freq: Two times a day (BID) | ORAL | Status: DC
Start: 2023-10-01 — End: 2023-10-04
  Administered 2023-10-01 – 2023-10-04 (×5): 237 mL via ORAL

## 2023-09-30 NOTE — Progress Notes (Signed)
[facility]  PROGRESS NOTE  Debbie Wells AOZ:[id number] DOB: May 31, 1941 DOA: 09/26/2023 PCP: Mahlon Gammon, MD   Brief HPI:   82 y.o. female with medical history significant for depression, anxiety, insomnia, hypothyroidism, and hypertension who presents with severe right hip pain after a fall.  Upon arrival to the ED,   Plain radiographs demonstrate acute impacted subcapital right femoral neck fracture.  S/p surgery on 11/26.  Hospital stay complicated by delirium/anxiety.       Assessment/Plan:   Right hip displaced subcapital femoral neck fracture -Underwent right hip hemiarthroplasty 11/26 -Continue pain control, supportive care- scheduled tylenol -DVT prophylaxis with aspirin   Hyponatremia  -stable with salt tablets   Hypertension  - Continue amlodipine and Toprol  -Continue hydralazine as needed   Depression, anxiety, insomnia  - Continue Buspar, Remeron, Cymbalta, Zyprexa, trazodone, and as-needed Xanax   -follows with Dr. Chales Abrahams-- recent medication changes about 1 week ago   Hypothyroidism  - Synthroid      CKD 3A  -AKI resolved.     Medications     acetaminophen  1,000 mg Oral QID   amLODipine  2.5 mg Oral Daily   aspirin  81 mg Oral BID   busPIRone  15 mg Oral BID   docusate sodium  100 mg Oral BID   DULoxetine  60 mg Oral Daily   feeding supplement  237 mL Oral Q supper   gabapentin  200 mg Oral BID   levothyroxine  100 mcg Oral Q0600   losartan  50 mg Oral BID   melatonin  3 mg Oral QHS   metoprolol succinate  12.5 mg Oral Daily   mirtazapine  7.5 mg Oral QHS   OLANZapine  5 mg Oral Daily   polyethylene glycol  17 g Oral Daily   saccharomyces boulardii  250 mg Oral BID   senna  1 tablet Oral Daily   sodium chloride flush  10 mL Intravenous Q12H   sodium chloride  1 g Oral TID     Data Reviewed:   CBG:  No results for input(s): "GLUCAP" in the last 168 hours.  SpO2: 95 % O2 Flow Rate (L/min): 0 L/min    Vitals:   09/30/23  0428 09/30/23 0745 09/30/23 0901 09/30/23 0955  BP: (!) 176/76 (!) 173/78 (!) 173/78 (!) 173/78  Pulse: 84 90 90   Resp: 18 18    Temp: 98.4 F (36.9 C) 98.8 F (37.1 C)    TempSrc: Oral Oral    SpO2: 97% 95%    Weight:      Height:          Data Reviewed:  Basic Metabolic Panel: Recent Labs  Lab 09/26/23 2043 09/27/23 0607 09/28/23 0613 09/29/23 0524 09/30/23 0614  NA 133* 133* 134* 132* 134*  K 3.7 4.2 3.9 3.9 3.6  CL 103 103 99 104 101  CO2 24 23 25 25 23   GLUCOSE 133* 115* 131* 89 89  BUN 8 9 7* 18 16  CREATININE 1.07* 1.41* 0.89 1.00 0.79  CALCIUM 9.4 9.2 9.5 8.7* 9.0    CBC: Recent Labs  Lab 09/26/23 2043 09/27/23 0607 09/28/23 0613 09/29/23 0524 09/30/23 0614  WBC 5.2 10.5 10.5 9.9 8.5  NEUTROABS 3.5  --   --   --   --   HGB 11.8* 10.9* 10.2* 9.3* 10.2*  HCT 36.3 33.0* 30.4* 27.6* 30.0*  MCV 96.0 92.7 91.3 92.3 91.7  PLT 216 190 181 172 192  LFT Recent Labs  Lab 09/26/23 2043  AST 29  ALT 23  ALKPHOS 82  BILITOT 0.7  PROT 6.6  ALBUMIN 3.6       DVT prophylaxis: Aspirin started per Ortho  Code Status: Full code  Family Communication: called son   CONSULTS orthopedics   Subjective   Not eating much per nursing   Objective    Physical Examination:    General: Appearance:    Well developed, well nourished female appears anxious     Lungs:     respirations unlabored  Heart:    Normal heart rate.   MS:   All extremities are intact.   Neurologic:   Awake, alert-     Status is: Inpatient:             Joseph Art   [facility] If 7PM-7AM, please contact night-coverage at [email or web address], Office  [phone]   09/30/2023, 12:36 PM  LOS: 4 days

## 2023-09-30 NOTE — Progress Notes (Signed)
       RE:   Debbie Wells     Date of Birth:  02-03-1941    Date:   09/30/2023     To Whom It May Concern:  Please be advised that the above-named patient will require a short-term nursing home stay - anticipated 30 days or less for rehabilitation and strengthening.  The plan is for return home.

## 2023-09-30 NOTE — Progress Notes (Signed)
Nutrition Follow-up  DOCUMENTATION CODES:   Non-severe (moderate) malnutrition in context of chronic illness  INTERVENTION:   -Continue regular diet with encouragement.  -If intakes continue to be poor, consider SLP swallow evaluation.  -Increase Ensure Enlive po to BID, each supplement provides 350 kcal and 20 grams of protein.  -Consider alternative appetite stimulant.   NUTRITION DIAGNOSIS:   Moderate Malnutrition related to chronic illness, poor appetite, acute illness as evidenced by meal completion < 25%, energy intake < 75% for > 7 days, percent weight loss, mild muscle depletion.    GOAL:   Patient will meet greater than or equal to 90% of their needs  -ongoing  MONITOR:   PO intake, Supplement acceptance, Skin, Labs, Weight trends  REASON FOR ASSESSMENT:   Consult Poor PO, Assessment of nutrition requirement/status  ASSESSMENT:   82 y/o female presented with severe right hip apin after a fall.   PMH: depression, anxiety, insomnia, hypothyroidism, HTN, chronic headaches, diverticulosis, HLD, hyperthyroidism, umbilical hernia, smoking hx  Patient appears quite anxious and overwhelmed during visit. She continues to not be able provide nutrition hx, notes poor appetite for unknown reason that has been longer than two weeks, although she is uncertain as to how long this has been the case. She is also not providing any food preferences when inquired. Admits to a hx of disordered eating but notes this is no longer an issue. Denies nausea. Per RN, she only took bites of scrambled eggs at B, drank a few sips of Ensure. Discussed a potential option of an appetite stimulant which she did not wish to discuss further at this time. Revisited importance of adequate nutrition to support healing and for maintenance of muscle mass.  Patient meets criteria for moderate malnutrition possibly severe depending on obtaining a more detailed diet hx. Per review of EMR, weight loss of 14%  within 1.75 months using weight from 08/07/23 of 69.4 kg.     Medications reviewed and include colace, synthroid, remeron, Miralax, Florastor, senna, sodium chloride 1 gm 3x daily.  Labs reviewed   Diet Order:   Diet Order             Diet regular Room service appropriate? Yes; Fluid consistency: Thin  Diet effective now                   EDUCATION NEEDS:   Not appropriate for education at this time  Skin:  Skin Assessment: Skin Integrity Issues: Skin Integrity Issues:: Incisions Incisions: R hip  Last BM:  09/27/23  Height:   Ht Readings from Last 1 Encounters:  09/27/23 5\' 4"  (1.626 m)    Weight:   Wt Readings from Last 1 Encounters:  09/27/23 59 kg    Ideal Body Weight:  56.8 kg  BMI:  Body mass index is 22.33 kg/m.  Estimated Nutritional Needs:   Kcal:  1600-1800 kcal/day  Protein:  68-80 gm/day  Fluid:  1600-1800 mL/day    Alvino Chapel, RDLD Clinical Dietitian See AMION for contact information

## 2023-09-30 NOTE — Progress Notes (Signed)
Occupational Therapy Treatment Patient Details Name: Debbie Wells MRN: [id number] DOB: Nov 18, 1940 Today's Date: 09/30/2023   History of present illness Pt is a 82 y.o. F who presents 09/26/2023 after a fall with right subcapital femoral neck fx s/p right direct anterior bipolar hip hemiarthroplasty. Significant PMH: severe anxiety and depression, CKD 3a.   OT comments  Pt remains very anxious, requires encouragement to participate in EOB/OOB session.  She requires mod assist for bed mobility today, min assist to stand but is incontinent of bowel in standing requiring total assist +2 for toileting hygiene. Pt voices she is very fearful of falling, re-assured throughout session.  Continue to recommend <3hrs/day inpatient setting at dc.       If plan is discharge home, recommend the following:  A lot of help with walking and/or transfers;A lot of help with bathing/dressing/bathroom;Assist for transportation;Help with stairs or ramp for entrance   Equipment Recommendations  Other (comment) (defer)    Recommendations for Other Services      Precautions / Restrictions Precautions Precautions: Fall Restrictions Weight Bearing Restrictions: No       Mobility Bed Mobility Overal bed mobility: Needs Assistance Bed Mobility: Supine to Sit, Sit to Supine     Supine to sit: Mod assist Sit to supine: Mod assist   General bed mobility comments: cueing for technique, pt assisting more than L LE than R LE but very hesitant to movement today and overall requiring mod assist for trunk and scooting; returned back to bed with BLE support    Transfers Overall transfer level: Needs assistance Equipment used: Rolling walker (2 wheels) Transfers: Sit to/from Stand Sit to Stand: Min assist           General transfer comment: cueing for hand placement, min assist to pwer up and steady     Balance Overall balance assessment: Needs assistance Sitting-balance support: Feet  supported Sitting balance-Leahy Scale: Good Sitting balance - Comments: min guard to close supervision   Standing balance support: Bilateral upper extremity supported Standing balance-Leahy Scale: Poor Standing balance comment: relies on BUE support                           ADL either performed or assessed with clinical judgement   ADL Overall ADL's : Needs assistance/impaired     Grooming: Set up;Sitting               Lower Body Dressing: Maximal assistance;Sit to/from stand   Toilet Transfer: Minimal assistance Statistician Details (indicate cue type and reason): simulated side stepping towards HOB, using RW Toileting- Clothing Manipulation and Hygiene: Total assistance;+2 for physical assistance;Bed level Toileting - Clothing Manipulation Details (indicate cue type and reason): incontinent of bowel in standing, returned to bed level for clean up with +2 assist     Functional mobility during ADLs: Minimal assistance;Moderate assistance;Rolling walker (2 wheels)      Extremity/Trunk Assessment              Vision   Vision Assessment?: No apparent visual deficits   Perception     Praxis      Cognition Arousal: Alert Behavior During Therapy: Anxious Overall Cognitive Status: No family/caregiver present to determine baseline cognitive functioning                                 General Comments: pt able to follow commands, she requires increased  encouragement as highly fearful of falling.  She is very anxious.  Incontinent of bowel today        Exercises      Shoulder Instructions       General Comments      Pertinent Vitals/ Pain       Pain Assessment Pain Assessment: Faces Faces Pain Scale: Hurts little more Pain Location: R hip Pain Descriptors / Indicators: Operative site guarding, Grimacing Pain Intervention(s): Limited activity within patient's tolerance, Monitored during session, Repositioned  Home Living                                           Prior Functioning/Environment              Frequency  Min 1X/week        Progress Toward Goals  OT Goals(current goals can now be found in the care plan section)  Progress towards OT goals: Progressing toward goals (slowly)  Acute Rehab OT Goals Patient Stated Goal: none stated Time For Goal Achievement: 10/12/23 Potential to Achieve Goals: Good  Plan      Co-evaluation                 AM-PAC OT "6 Clicks" Daily Activity     Outcome Measure   Help from another person eating meals?: None Help from another person taking care of personal grooming?: A Little Help from another person toileting, which includes using toliet, bedpan, or urinal?: Total Help from another person bathing (including washing, rinsing, drying)?: A Lot Help from another person to put on and taking off regular upper body clothing?: A Little Help from another person to put on and taking off regular lower body clothing?: Total 6 Click Score: 14    End of Session Equipment Utilized During Treatment: Gait belt;Rolling walker (2 wheels)  OT Visit Diagnosis: Other abnormalities of gait and mobility (R26.89);Muscle weakness (generalized) (M62.81);Pain;History of falling (Z91.81) Pain - Right/Left: Right Pain - part of body: Hip   Activity Tolerance Patient tolerated treatment well   Patient Left in bed;with call bell/phone within reach;with nursing/sitter in room   Nurse Communication Mobility status        Time: 1030-1055 OT Time Calculation (min): 25 min  Charges: OT General Charges $OT Visit: 1 Visit OT Treatments $Self Care/Home Management : 23-37 mins  Barry Brunner, OT Acute Rehabilitation Services Office [phone]   Chancy Milroy 09/30/2023, 12:05 PM

## 2023-10-01 DIAGNOSIS — S72001A Fracture of unspecified part of neck of right femur, initial encounter for closed fracture: Secondary | ICD-10-CM | POA: Diagnosis not present

## 2023-10-01 DIAGNOSIS — E440 Moderate protein-calorie malnutrition: Secondary | ICD-10-CM | POA: Insufficient documentation

## 2023-10-01 LAB — BASIC METABOLIC PANEL
Anion gap: 6 (ref 5–15)
BUN: 12 mg/dL (ref 8–23)
CO2: 25 mmol/L (ref 22–32)
Calcium: 9 mg/dL (ref 8.9–10.3)
Chloride: 104 mmol/L (ref 98–111)
Creatinine, Ser: 0.78 mg/dL (ref 0.44–1.00)
GFR, Estimated: >60 mL/min (ref >60)
Glucose, Bld: 108 mg/dL — ABNORMAL HIGH (ref 70–99)
Potassium: 3.7 mmol/L (ref 3.5–5.1)
Sodium: 135 mmol/L (ref 135–145)

## 2023-10-01 LAB — CBC
HCT: 32.6 % — ABNORMAL LOW (ref 36.0–46.0)
Hemoglobin: 10.8 g/dL — ABNORMAL LOW (ref 12.0–15.0)
MCH: 30.1 pg (ref 26.0–34.0)
MCHC: 33.1 g/dL (ref 30.0–36.0)
MCV: 90.8 fL (ref 80.0–100.0)
Platelets: 227 10*3/uL (ref 150–400)
RBC: 3.59 MIL/uL — ABNORMAL LOW (ref 3.87–5.11)
RDW: 12.3 % (ref 11.5–15.5)
WBC: 8.4 10*3/uL (ref 4.0–10.5)
nRBC: 0 % (ref 0.0–0.2)

## 2023-10-01 NOTE — Progress Notes (Signed)
PT Cancellation Note  Patient Details Name: Debbie Wells MRN: [id number] DOB: July 08, 1941   Cancelled Treatment:    Reason Eval/Treat Not Completed: (P) Fatigue/lethargy limiting ability to participate (RN defer, pt was up OOB x2 today and worked with mobility, now just got back to bed and is fatigued.) Will continue efforts per PT plan of care as schedule permits once medically appropriate.   [name] [name] 10/01/2023, 6:25 PM

## 2023-10-01 NOTE — TOC Progression Note (Signed)
Transition of Care [facility]) - Progression Note    Patient Details  Name: Debbie Wells MRN: [id number] Date of Birth: [birth year]  Transition of Care [facility]) CM/SW Contact  Donnalee Curry, LCSWA Phone Number: 10/01/2023, 11:01 AM  Clinical Narrative:     PASRR under manual review.   SW attempted to Games developer [facility] Haynes Bast [phone]) regarding SNF bed, but she is out of office until Monday 12/2.         Expected Discharge Plan and Services                                               Social Determinants of Health (SDOH) Interventions SDOH Screenings   Food Insecurity: No Food Insecurity (09/27/2023)  Housing: Low Risk  (09/27/2023)  Transportation Needs: No Transportation Needs (09/27/2023)  Utilities: Not At Risk (09/27/2023)  Depression (PHQ2-9): Low Risk  (03/17/2023)  Financial Resource Strain: Low Risk  (10/03/2020)   Received from [facility], [facility]  Social Connections: Unknown (03/16/2022)   Received from [facility], [facility]  Stress: Stress Concern Present (10/03/2020)   Received from [facility], [facility]  Tobacco Use: Medium Risk (09/27/2023)    Readmission Risk Interventions     No data to display

## 2023-10-01 NOTE — Progress Notes (Signed)
   10/01/23 1115  Mobility  Activity Ambulated with assistance in hallway  Level of Assistance Contact guard assist, steadying assist (MinA STS)  Assistive Device Front wheel walker  Distance Ambulated (ft) 40 ft  RLE Weight Bearing WBAT  Activity Response Tolerated fair  Mobility Referral Yes  Mobility Specialist Start Time (ACUTE ONLY) 1055  Mobility Specialist Stop Time (ACUTE ONLY) 1115  Mobility Specialist Time Calculation (min) (ACUTE ONLY) 20 min   Mobility Specialist: Progress Note  Post Ambulation: HR 97, SPO2 97% RA   Pt requested mobility session - received in chair. Required MinA STS, CG for ambulation using RW. C/o slight dizziness at BOS, VSS. Returned to chair with all needs met - call bell within reach. Chair alarm on. MD present.  Barnie Mort, BS Mobility Specialist Please contact via SecureChat or Rehab office at [phone].

## 2023-10-01 NOTE — Progress Notes (Signed)
[facility]  PROGRESS NOTE  Debbie TING OZH:[id number] DOB: 1941/10/28 DOA: 09/26/2023 PCP: Mahlon Gammon, MD   Brief HPI:   82 y.o. female with medical history significant for depression, anxiety, insomnia, hypothyroidism, and hypertension who presents with severe right hip pain after a fall.  Upon arrival to the ED,   Plain radiographs demonstrate acute impacted subcapital right femoral neck fracture.  S/p surgery on 11/26.  Hospital stay complicated by delirium/anxiety.  Seems much better AM of 11/30     Assessment/Plan:   Right hip displaced subcapital femoral neck fracture -Underwent right hip hemiarthroplasty 11/26 -Continue pain control, supportive care- scheduled tylenol -DVT prophylaxis with aspirin   Hyponatremia  -stable with salt tablets   Hypertension  - Continue amlodipine and Toprol  -Continue hydralazine as needed   Depression, anxiety, insomnia  - Continue Buspar, Remeron, Cymbalta, Zyprexa, trazodone, and as-needed Xanax   -follows with Dr. Chales Abrahams-- recent medication changes about 1 week ago   Hypothyroidism  - Synthroid      CKD 3A  -AKI resolved.     Medications     acetaminophen  1,000 mg Oral QID   amLODipine  2.5 mg Oral Daily   aspirin  81 mg Oral BID   busPIRone  15 mg Oral BID   DULoxetine  60 mg Oral Daily   feeding supplement  237 mL Oral BID BM   gabapentin  200 mg Oral BID   levothyroxine  100 mcg Oral Q0600   losartan  50 mg Oral BID   melatonin  3 mg Oral QHS   metoprolol succinate  12.5 mg Oral Daily   mirtazapine  7.5 mg Oral QHS   OLANZapine  5 mg Oral Daily   saccharomyces boulardii  250 mg Oral BID   sodium chloride flush  10 mL Intravenous Q12H     Data Reviewed:   CBG:  No results for input(s): "GLUCAP" in the last 168 hours.  SpO2: 100 % O2 Flow Rate (L/min): 0 L/min    Vitals:   09/30/23 1529 09/30/23 1953 10/01/23 0500 10/01/23 0724  BP: (!) 146/67 (!) 164/76 (!) 172/79 134/89  Pulse: 87 82 82    Resp: 18 18 17 18   Temp: 98.1 F (36.7 C) 98 F (36.7 C) 98.3 F (36.8 C) 98 F (36.7 C)  TempSrc: Oral Oral Oral Oral  SpO2: 94% 97% 95% 100%  Weight:      Height:          Data Reviewed:  Basic Metabolic Panel: Recent Labs  Lab 09/27/23 0607 09/28/23 0613 09/29/23 0524 09/30/23 0614 10/01/23 0340  NA 133* 134* 132* 134* 135  K 4.2 3.9 3.9 3.6 3.7  CL 103 99 104 101 104  CO2 23 25 25 23 25   GLUCOSE 115* 131* 89 89 108*  BUN 9 7* 18 16 12   CREATININE 1.41* 0.89 1.00 0.79 0.78  CALCIUM 9.2 9.5 8.7* 9.0 9.0    CBC: Recent Labs  Lab 09/26/23 2043 09/27/23 0607 09/28/23 0613 09/29/23 0524 09/30/23 0614 10/01/23 0340  WBC 5.2 10.5 10.5 9.9 8.5 8.4  NEUTROABS 3.5  --   --   --   --   --   HGB 11.8* 10.9* 10.2* 9.3* 10.2* 10.8*  HCT 36.3 33.0* 30.4* 27.6* 30.0* 32.6*  MCV 96.0 92.7 91.3 92.3 91.7 90.8  PLT 216 190 181 172 192 227    LFT Recent Labs  Lab 09/26/23 2043  AST 29  ALT 23  ALKPHOS  82  BILITOT 0.7  PROT 6.6  ALBUMIN 3.6       DVT prophylaxis: Aspirin started per Ortho  Code Status: Full code  Family Communication: called son   CONSULTS orthopedics   Subjective   Up with mobility specialist   Objective    Physical Examination:    General: Appearance:    Well developed, well nourished female appears anxious     Lungs:     respirations unlabored  Heart:    Normal heart rate.   MS:   All extremities are intact.   Neurologic:   Awake, alert- more oriented today     Status is: Inpatient:             Joseph Art   [facility] If 7PM-7AM, please contact night-coverage at [email or web address], Office  [phone]   10/01/2023, 12:48 PM  LOS: 5 days

## 2023-10-01 NOTE — Progress Notes (Addendum)
   10/01/23 1023  Mobility  Activity Transferred from bed to chair  Level of Assistance Contact guard assist, steadying assist (MinA STS)  Assistive Device Front wheel walker  Distance Ambulated (ft) 5 ft  RLE Weight Bearing WBAT  Activity Response Tolerated fair  Mobility Referral Yes  $Mobility charge 1 Mobility  Mobility Specialist Start Time (ACUTE ONLY) 1001  Mobility Specialist Stop Time (ACUTE ONLY) 1023  Mobility Specialist Time Calculation (min) (ACUTE ONLY) 22 min   Mobility Specialist: Progress Note   Pt agreeable to mobility session - received in bed. Required MinA STS, CG for ambulation using RW. C/o BR urgency and slight R hip pain.  Returned to chair with all needs met - call bell within reach. Chair alarm on.   Barnie Mort, BS Mobility Specialist Please contact via SecureChat or Rehab office at [phone].

## 2023-10-02 DIAGNOSIS — S72001A Fracture of unspecified part of neck of right femur, initial encounter for closed fracture: Secondary | ICD-10-CM | POA: Diagnosis not present

## 2023-10-02 NOTE — Plan of Care (Signed)
  Problem: Education: Goal: Knowledge of General Education information will improve Description: Including pain rating scale, medication(s)/side effects and non-pharmacologic comfort measures Outcome: Progressing   Problem: Coping: Goal: Level of anxiety will decrease Outcome: Progressing   Problem: Elimination: Goal: Will not experience complications related to bowel motility Outcome: Progressing Goal: Will not experience complications related to urinary retention Outcome: Progressing   Problem: Pain Management: Goal: General experience of comfort will improve Outcome: Progressing   Problem: Safety: Goal: Ability to remain free from injury will improve Outcome: Progressing   Problem: Activity: Goal: Ability to avoid complications of mobility impairment will improve Outcome: Progressing Goal: Ability to tolerate increased activity will improve Outcome: Progressing   Problem: Clinical Measurements: Goal: Postoperative complications will be avoided or minimized Outcome: Progressing   Problem: Pain Management: Goal: Pain level will decrease with appropriate interventions Outcome: Progressing

## 2023-10-02 NOTE — Plan of Care (Signed)
  Problem: Education: Goal: Knowledge of General Education information will improve Description: Including pain rating scale, medication(s)/side effects and non-pharmacologic comfort measures Outcome: Progressing   Problem: Health Behavior/Discharge Planning: Goal: Ability to manage health-related needs will improve Outcome: Progressing   Problem: Clinical Measurements: Goal: Ability to maintain clinical measurements within normal limits will improve Outcome: Progressing Goal: Will remain free from infection Outcome: Progressing Goal: Diagnostic test results will improve Outcome: Progressing Goal: Respiratory complications will improve Outcome: Progressing Goal: Cardiovascular complication will be avoided Outcome: Progressing   Problem: Activity: Goal: Risk for activity intolerance will decrease Outcome: Progressing   Problem: Nutrition: Goal: Adequate nutrition will be maintained Outcome: Progressing   Problem: Coping: Goal: Level of anxiety will decrease Outcome: Progressing   Problem: Elimination: Goal: Will not experience complications related to bowel motility Outcome: Progressing Goal: Will not experience complications related to urinary retention Outcome: Progressing   Problem: Pain Management: Goal: General experience of comfort will improve Outcome: Progressing   Problem: Safety: Goal: Ability to remain free from injury will improve Outcome: Progressing   Problem: Skin Integrity: Goal: Risk for impaired skin integrity will decrease Outcome: Progressing   Problem: Activity: Goal: Ability to avoid complications of mobility impairment will improve Outcome: Progressing Goal: Ability to tolerate increased activity will improve Outcome: Progressing   Problem: Clinical Measurements: Goal: Postoperative complications will be avoided or minimized Outcome: Progressing   Problem: Pain Management: Goal: Pain level will decrease with appropriate  interventions Outcome: Progressing   Problem: Skin Integrity: Goal: Will show signs of wound healing Outcome: Progressing

## 2023-10-02 NOTE — Progress Notes (Signed)
[facility]  PROGRESS NOTE  Debbie Wells EAV:[id number] DOB: 12-May-1941 DOA: 09/26/2023 PCP: Mahlon Gammon, MD   Brief HPI:   82 y.o. female with medical history significant for depression, anxiety, insomnia, hypothyroidism, and hypertension who presents with severe right hip pain after a fall.  Upon arrival to the ED,   Plain radiographs demonstrate acute impacted subcapital right femoral neck fracture.  S/p surgery on 11/26.  Hospital stay complicated by delirium/anxiety.  Seems much better AM of 11/30     Assessment/Plan:   Right hip displaced subcapital femoral neck fracture -Underwent right hip hemiarthroplasty 11/26 -Continue pain control, supportive care- scheduled tylenol -DVT prophylaxis with aspirin   Hyponatremia  -stable with salt tablets   Hypertension  - Continue amlodipine and Toprol  -Continue hydralazine as needed   Depression, anxiety, insomnia  - Continue Buspar, Remeron, Cymbalta, Zyprexa, trazodone, and as-needed Xanax   -follows with Dr. Chales Abrahams-- recent medication changes about 1 week ago   Hypothyroidism  - Synthroid      CKD 3A  -AKI resolved.    SNF placement MONDAY?  Medications     acetaminophen  1,000 mg Oral QID   amLODipine  2.5 mg Oral Daily   aspirin  81 mg Oral BID   busPIRone  15 mg Oral BID   DULoxetine  60 mg Oral Daily   feeding supplement  237 mL Oral BID BM   gabapentin  200 mg Oral BID   levothyroxine  100 mcg Oral Q0600   losartan  50 mg Oral BID   melatonin  3 mg Oral QHS   metoprolol succinate  12.5 mg Oral Daily   mirtazapine  7.5 mg Oral QHS   OLANZapine  5 mg Oral Daily   saccharomyces boulardii  250 mg Oral BID   sodium chloride flush  10 mL Intravenous Q12H     Data Reviewed:   CBG:  No results for input(s): "GLUCAP" in the last 168 hours.  SpO2: 95 % O2 Flow Rate (L/min): 0 L/min    Vitals:   10/01/23 0724 10/01/23 2045 10/02/23 0417 10/02/23 0733  BP: 134/89 (!) 159/79 (!) 156/74 (!)  146/77  Pulse:  86 (!) 103 87  Resp: 18 17 18    Temp: 98 F (36.7 C) 97.9 F (36.6 C) 98.3 F (36.8 C) 98.6 F (37 C)  TempSrc: Oral Oral Oral Oral  SpO2: 100% 99% 95% 95%  Weight:      Height:          Data Reviewed:  Basic Metabolic Panel: Recent Labs  Lab 09/27/23 0607 09/28/23 0613 09/29/23 0524 09/30/23 0614 10/01/23 0340  NA 133* 134* 132* 134* 135  K 4.2 3.9 3.9 3.6 3.7  CL 103 99 104 101 104  CO2 23 25 25 23 25   GLUCOSE 115* 131* 89 89 108*  BUN 9 7* 18 16 12   CREATININE 1.41* 0.89 1.00 0.79 0.78  CALCIUM 9.2 9.5 8.7* 9.0 9.0    CBC: Recent Labs  Lab 09/26/23 2043 09/27/23 0607 09/28/23 0613 09/29/23 0524 09/30/23 0614 10/01/23 0340  WBC 5.2 10.5 10.5 9.9 8.5 8.4  NEUTROABS 3.5  --   --   --   --   --   HGB 11.8* 10.9* 10.2* 9.3* 10.2* 10.8*  HCT 36.3 33.0* 30.4* 27.6* 30.0* 32.6*  MCV 96.0 92.7 91.3 92.3 91.7 90.8  PLT 216 190 181 172 192 227    LFT Recent Labs  Lab 09/26/23 2043  AST 29  ALT 23  ALKPHOS 82  BILITOT 0.7  PROT 6.6  ALBUMIN 3.6       DVT prophylaxis: Aspirin started per Ortho  Code Status: Full code  Family Communication: called son 11/30   CONSULTS orthopedics   Subjective   Sleepy this AM   Objective    Physical Examination:    General: Appearance:    Elderly female in no acute distress     Lungs:      respirations unlabored  Heart:    Normal heart rate. .   MS:   All extremities are intact.   Neurologic:   Awake, alert- not as interactive today     Status is: Inpatient:             Joseph Art   [facility] If 7PM-7AM, please contact night-coverage at [email or web address], Office  [phone]   10/02/2023, 11:06 AM  LOS: 6 days

## 2023-10-03 DIAGNOSIS — S72001A Fracture of unspecified part of neck of right femur, initial encounter for closed fracture: Secondary | ICD-10-CM | POA: Diagnosis not present

## 2023-10-03 MED ORDER — ENSURE ENLIVE PO LIQD: 237.0000 mL | Freq: Two times a day (BID) | ORAL | Status: DC

## 2023-10-03 MED ORDER — ALPRAZOLAM 0.5 MG PO TABS: 0.5000 mg | ORAL_TABLET | Freq: Three times a day (TID) | ORAL | 0 refills | Status: DC | PRN

## 2023-10-03 NOTE — Plan of Care (Signed)
  Problem: Education: Goal: Knowledge of General Education information will improve Description: Including pain rating scale, medication(s)/side effects and non-pharmacologic comfort measures Outcome: Progressing   Problem: Clinical Measurements: Goal: Will remain free from infection Outcome: Progressing   Problem: Activity: Goal: Risk for activity intolerance will decrease Outcome: Progressing   

## 2023-10-03 NOTE — TOC Progression Note (Addendum)
Transition of Care [facility]) - Progression Note    Patient Details  Name: Debbie Wells MRN: [id number] Date of Birth: 07/09/1941  Transition of Care [facility]) CM/SW Contact  Lorri Frederick, LCSW Phone Number: 10/03/2023, 10:51 AM  Clinical Narrative:   CSW spoke with katie/[facility].  She will need to check bed status to see if they can receive pt today.  New PT note needed for auth, PT aware.   1045: TC Katie: they do have bed available, need passr and auth.    1310: PASSR received: [id number]  SNF auth request submitted in [place].      Expected Discharge Plan and Services         Expected Discharge Date: 10/03/23                                     Social Determinants of Health (SDOH) Interventions SDOH Screenings   Food Insecurity: No Food Insecurity (09/27/2023)  Housing: Low Risk  (09/27/2023)  Transportation Needs: No Transportation Needs (09/27/2023)  Utilities: Not At Risk (09/27/2023)  Depression (PHQ2-9): Low Risk  (03/17/2023)  Financial Resource Strain: Low Risk  (10/03/2020)   Received from [facility], [facility]  Social Connections: Unknown (03/16/2022)   Received from [facility], [facility]  Stress: Stress Concern Present (10/03/2020)   Received from [facility], [facility]  Tobacco Use: Medium Risk (09/27/2023)    Readmission Risk Interventions     No data to display

## 2023-10-03 NOTE — Discharge Summary (Signed)
Physician Discharge Summary  Debbie Wells:[id number] DOB: 03/15/1941 DOA: 09/26/2023  PCP: Mahlon Gammon, MD  Admit date: 09/26/2023 Discharge date: 10/03/2023  Admitted From: ILF/ALF Discharge disposition: SNF   Recommendations for Outpatient Follow-Up:   Cbc/bmp1 week Follow up with ortho 1 week-baby aspirin twice daily for DVT coverage  Consider palliative care consult for GOC/code status discussion   Discharge Diagnosis:   Principal Problem:   Closed right hip fracture, initial encounter (HCC) Active Problems:   Hyponatremia   Essential (primary) hypertension   Chronic kidney disease, stage 3a (HCC)   Major depressive disorder, recurrent severe without psychotic features (HCC)   Hypothyroidism   Anxiety   Malnutrition of moderate degree    Discharge Condition: Improved.  Diet recommendation:  Regular.  Wound care: None.  Code status: Full.   History of Present Illness:   Debbie Wells is a 82 y.o. female with medical history significant for depression, anxiety, insomnia, hypothyroidism, and hypertension who presents with severe right hip pain after a fall.    Patient was in her usual state having an uneventful day when she was carrying a tray, tripped, and fell to the ground.  She has been experiencing severe right hip pain ever since.  She denies loss of consciousness or any other appreciable injuries.  She states that she typically uses a walker but was trying to carry something with both hands when she fell.  She denies history of heart disease and never experiences chest discomfort with activity.     Hospital Course by Problem:   Right hip displaced subcapital femoral neck fracture -Underwent right hip hemiarthroplasty 11/26 -Continue pain control, supportive care- scheduled tylenol -DVT prophylaxis with aspirin    Hyponatremia  -stable with salt tablets    Hypertension  - Continue amlodipine and Toprol  -Continue hydralazine as  needed   Depression, anxiety, insomnia  - Continue Buspar, Remeron, Cymbalta, Zyprexa, trazodone, and as-needed Xanax   -follows with Dr. Chales Abrahams-- recent medication changes about 1 week ago   Hypothyroidism  - Synthroid      CKD 3A  -AKI resolved.      Medical Consultants:    ortho  Discharge Exam:   Vitals:   10/03/23 0610 10/03/23 0800  BP: (!) 158/68 134/73  Pulse: 87 88  Resp: 18 18  Temp: 98.6 F (37 C) 99 F (37.2 C)  SpO2: 95% 95%   Vitals:   10/02/23 1443 10/02/23 2155 10/03/23 0610 10/03/23 0800  BP: 110/63 (!) 129/54 (!) 158/68 134/73  Pulse: 94 83 87 88  Resp:  18 18 18   Temp: 98.3 F (36.8 C) 98.4 F (36.9 C) 98.6 F (37 C) 99 F (37.2 C)  TempSrc: Oral Oral Oral Oral  SpO2: 95% 95% 95% 95%  Weight:      Height:        General exam: Appears calm and comfortable.    The results of significant diagnostics from this hospitalization (including imaging, microbiology, ancillary and laboratory) are listed below for reference.     Procedures and Diagnostic Studies:   US RENAL  Result Date: 09/27/2023 CLINICAL DATA:  Acute kidney injury EXAM: RENAL / URINARY TRACT ULTRASOUND COMPLETE COMPARISON:  CT 08/07/2023 FINDINGS: Right Kidney: Renal measurements: 9.4 x 4.8 x 4.6 cm = volume: 108.5 mL. Echogenicity within normal limits. Mild right hydronephrosis. Left Kidney: Renal measurements: 9.4 x 5.2 x 4 cm = volume: 102.1 mL. Echogenicity within normal limits. No mass or hydronephrosis. Bladder:  Catheter in the bladder.  Moderate to marked bladder distension Other: None. IMPRESSION: 1. Mild right hydronephrosis. 2. Moderate to marked bladder distension with catheter in place. Correlate for catheter function Electronically Signed   By: Jasmine Pang M.D.   On: 09/27/2023 23:33   DG Pelvis Portable  Result Date: 09/27/2023 CLINICAL DATA:  Postop. EXAM: PORTABLE PELVIS 1-2 VIEWS COMPARISON:  Preoperative imaging FINDINGS: Right hip hemiarthroplasty in  expected alignment. No periprosthetic lucency or fracture. Recent postsurgical change includes air and edema in the soft tissues. Lateral skin staples in place. IMPRESSION: Right hip hemiarthroplasty without immediate postoperative complication. Electronically Signed   By: Narda Rutherford M.D.   On: 09/27/2023 17:36   DG HIP UNILAT WITH PELVIS 1V RIGHT  Result Date: 09/27/2023 CLINICAL DATA:  [id number] Surgery, elective [id number] EXAM: DG HIP (WITH OR WITHOUT PELVIS) 1V RIGHT COMPARISON:  Pre-Operative imaging FINDINGS: Four fluoroscopic spot views of the pelvis and right hip obtained in the operating room. Images during hip arthroplasty. Fluoroscopy time 14 seconds. Dose 0.89 mGy. IMPRESSION: Intraoperative fluoroscopy during right hip arthroplasty. Electronically Signed   By: Narda Rutherford M.D.   On: 09/27/2023 17:36   DG C-Arm 1-60 Min-No Report  Result Date: 09/27/2023 Fluoroscopy was utilized by the requesting physician.  No radiographic interpretation.   DG FEMUR PORT, MIN 2 VIEWS RIGHT  Result Date: 09/27/2023 CLINICAL DATA:  Right hip fracture EXAM: RIGHT FEMUR PORTABLE 2 VIEW COMPARISON:  None Available. FINDINGS: Two view radiograph of the right femur again demonstrates an impacted subcapital right femoral neck fracture. No dislocation. Right hip joint space preserved. The distal fracture fragment consisting of the femoral shaft is intact. Limited images of the right knee are unremarkable. IMPRESSION: 1. Impacted subcapital right femoral neck fracture. Distal femur appears intact. Electronically Signed   By: Helyn Numbers M.D.   On: 09/27/2023 00:13   DG CHEST PORT 1 VIEW  Result Date: 09/27/2023 CLINICAL DATA:  Right hip fracture. Medical clearance for surgical intervention EXAM: PORTABLE CHEST 1 VIEW COMPARISON:  None Available. FINDINGS: The heart size and mediastinal contours are within normal limits. Both lungs are clear. The visualized skeletal structures are unremarkable.  IMPRESSION: No active disease. Electronically Signed   By: Helyn Numbers M.D.   On: 09/27/2023 00:12   DG Hip Unilat W or Wo Pelvis 2-3 Views Right  Result Date: 09/26/2023 CLINICAL DATA:  Fall EXAM: DG HIP (WITH OR WITHOUT PELVIS) 2-3V RIGHT COMPARISON:  None Available. FINDINGS: There is an acute, impacted subcapital right femoral neck fracture. The femoral head is still seated within the right acetabulum and right hip joint space is preserved. The pelvis and visualized left hip are intact. Sacroiliac and left hip joint spaces are preserved. Soft tissues are unremarkable. IMPRESSION: 1. Acute, impacted subcapital right femoral neck fracture. Electronically Signed   By: Helyn Numbers M.D.   On: 09/26/2023 22:02     Labs:   Basic Metabolic Panel: Recent Labs  Lab 09/27/23 0607 09/28/23 0613 09/29/23 0524 09/30/23 0614 10/01/23 0340  NA 133* 134* 132* 134* 135  K 4.2 3.9 3.9 3.6 3.7  CL 103 99 104 101 104  CO2 23 25 25 23 25   GLUCOSE 115* 131* 89 89 108*  BUN 9 7* 18 16 12   CREATININE 1.41* 0.89 1.00 0.79 0.78  CALCIUM 9.2 9.5 8.7* 9.0 9.0   GFR Estimated Creatinine Clearance: 47.6 mL/min (by C-G formula based on SCr of 0.78 mg/dL). Liver Function Tests: Recent Labs  Lab 09/26/23 2043  AST 29  ALT 23  ALKPHOS 82  BILITOT 0.7  PROT 6.6  ALBUMIN 3.6   No results for input(s): "LIPASE", "AMYLASE" in the last 168 hours. No results for input(s): "AMMONIA" in the last 168 hours. Coagulation profile No results for input(s): "INR", "PROTIME" in the last 168 hours.  CBC: Recent Labs  Lab 09/26/23 2043 09/27/23 0607 09/28/23 0613 09/29/23 0524 09/30/23 0614 10/01/23 0340  WBC 5.2 10.5 10.5 9.9 8.5 8.4  NEUTROABS 3.5  --   --   --   --   --   HGB 11.8* 10.9* 10.2* 9.3* 10.2* 10.8*  HCT 36.3 33.0* 30.4* 27.6* 30.0* 32.6*  MCV 96.0 92.7 91.3 92.3 91.7 90.8  PLT 216 190 181 172 192 227   Cardiac Enzymes: No results for input(s): "CKTOTAL", "CKMB", "CKMBINDEX",  "TROPONINI" in the last 168 hours. BNP: Invalid input(s): "POCBNP" CBG: No results for input(s): "GLUCAP" in the last 168 hours. D-Dimer No results for input(s): "DDIMER" in the last 72 hours. Hgb A1c No results for input(s): "HGBA1C" in the last 72 hours. Lipid Profile No results for input(s): "CHOL", "HDL", "LDLCALC", "TRIG", "CHOLHDL", "LDLDIRECT" in the last 72 hours. Thyroid function studies No results for input(s): "TSH", "T4TOTAL", "T3FREE", "THYROIDAB" in the last 72 hours.  Invalid input(s): "FREET3" Anemia work up No results for input(s): "VITAMINB12", "FOLATE", "FERRITIN", "TIBC", "IRON", "RETICCTPCT" in the last 72 hours. Microbiology Recent Results (from the past 240 hour(s))  Surgical pcr screen     Status: None   Collection Time: 09/27/23 12:31 PM   Specimen: Nasal Mucosa; Nasal Swab  Result Value Ref Range Status   MRSA, PCR NEGATIVE NEGATIVE Final   Staphylococcus aureus NEGATIVE NEGATIVE Final    Comment: (NOTE) The Xpert SA Assay (FDA approved for NASAL specimens in patients 38 years of age and older), is one component of a comprehensive surveillance program. It is not intended to diagnose infection nor to guide or monitor treatment. Performed at [facility], [address], [place], [place] [address]      Discharge Instructions:   Discharge Instructions     Diet general   Complete by: As directed    Increase activity slowly   Complete by: As directed       Allergies as of 10/03/2023       Reactions   Hydrocodone-acetaminophen Rash   Vicodin=REACTION: rash Not listed on the [facility]   Iodinated Contrast Media Hives, Rash   Per patient she has developed hives and itching in upper trunk of body after last 2 CT scans once she got homes that she has just treated at home with benadryl. She has not told anyone until now. States she was able to bear it  Other reaction(s): Rash Per patient she has developed hives and itching in upper trunk of body  after last 2 CT scans once she got homes that she has just treated at home with benadryl. She has not told anyone until now. States she was able to bear it  Not listed on the [facility]   Sulfa Antibiotics Rash   REACTION: rash Other reaction(s): Unknown   Codeine    Unknown reaction  Not listed on the [facility]   Sulfonamide Derivatives    REACTION: rash   Terbinafine    Unknown reaction Not listed on the [facility]   Benazepril Cough   Not listed on the [facility]        Medication List     STOP taking these medications  ciprofloxacin 500 MG tablet Commonly known as: CIPRO   meloxicam 15 MG tablet Commonly known as: MOBIC   trimethoprim 100 MG tablet Commonly known as: TRIMPEX       TAKE these medications    acetaminophen 325 MG tablet Commonly known as: TYLENOL Take 650 mg by mouth every 4 (four) hours as needed (for pain).   ALPRAZolam 0.5 MG tablet Commonly known as: XANAX Take 1 tablet (0.5 mg total) by mouth 3 (three) times daily as needed.   amLODipine 2.5 MG tablet Commonly known as: NORVASC Take 2.5 mg by mouth daily.   aspirin 81 MG chewable tablet Chew 1 tablet (81 mg total) by mouth 2 (two) times daily.   busPIRone 15 MG tablet Commonly known as: BUSPAR Take 15 mg by mouth 2 (two) times daily.   Docusate Sodium 100 MG capsule Take 100 mg by mouth as needed for constipation.   DULoxetine 60 MG capsule Commonly known as: CYMBALTA Take 60 mg by mouth daily.   estradiol 0.1 MG/GM vaginal cream Commonly known as: ESTRACE Place 1 Applicatorful vaginally 2 (two) times a week. Tuesday and Friday   feeding supplement Liqd Take 237 mLs by mouth 2 (two) times daily between meals.   gabapentin 100 MG capsule Commonly known as: NEURONTIN Take 200 mg by mouth 2 (two) times daily.   levothyroxine 100 MCG tablet Commonly known as: SYNTHROID Take 100 mcg by mouth daily.   losartan 50 MG tablet Commonly known as: COZAAR Take 50 mg by mouth 2 (two) times daily.    metoprolol succinate 12.5 mg Tb24 24 hr tablet Commonly known as: TOPROL-XL Take 12.5 mg by mouth daily.   mirtazapine 7.5 MG tablet Commonly known as: REMERON Take 7.5 mg by mouth at bedtime.   OLANZapine 5 MG tablet Commonly known as: ZYPREXA Take 5 mg by mouth daily.   ondansetron 8 MG tablet Commonly known as: ZOFRAN Take 8 mg by mouth as needed for nausea or vomiting.   polyethylene glycol 17 g packet Commonly known as: MiraLax Take 17 g by mouth daily.   saccharomyces boulardii 250 MG capsule Commonly known as: FLORASTOR Take 250 mg by mouth 2 (two) times daily.   senna 8.6 MG Tabs tablet Commonly known as: SENOKOT Take 1 tablet by mouth daily. May also take 2 tablets by mouth as needed for constipation   sodium chloride 1 g tablet Take 1 g by mouth 3 (three) times daily.   traMADol 50 MG tablet Commonly known as: ULTRAM Take 1-2 tablets (50-100 mg total) by mouth every 6 (six) hours as needed for moderate pain (pain score 4-6).   traZODone 50 MG tablet Commonly known as: DESYREL Take 50 mg by mouth at bedtime as needed for sleep.        Follow-up Information     Kathryne Hitch, MD. Schedule an appointment as soon as possible for a visit in 1 week(s).   Specialty: Orthopedic Surgery Contact information: [address] [place] [address] [phone]                  Time coordinating discharge: 45 min  Signed:  Joseph Art DO  [facility] 10/03/2023, 11:16 AM

## 2023-10-03 NOTE — Plan of Care (Signed)
  Problem: Activity: Goal: Risk for activity intolerance will decrease Outcome: Not Progressing   Problem: Safety: Goal: Ability to remain free from injury will improve Outcome: Not Progressing   

## 2023-10-03 NOTE — Progress Notes (Signed)
Physical Therapy Treatment Patient Details Name: Debbie Wells MRN: [id number] DOB: 1941/06/07 Today's Date: 10/03/2023   History of Present Illness Pt is a 82 y.o. F who presents 09/26/2023 after a fall with right subcapital femoral neck fx s/p right direct anterior bipolar hip hemiarthroplasty. Significant PMH: severe anxiety and depression, CKD 3a.    PT Comments  Treatment session somewhat limited due to pt bowel incontinence, however, pt does demonstrate progression in terms of mobility. Pt able to tolerate multiple transfers for doffing underwear and for peri care. Requiring minimal assist overall for sit to stands and taking pivotal steps from bed to chair using rolling walker. Pain seems to be fairly well managed. Patient will benefit from continued inpatient follow up therapy, <3 hours/day to address deficits and maximize functional mobility.     If plan is discharge home, recommend the following: A little help with walking and/or transfers;A little help with bathing/dressing/bathroom;Assistance with cooking/housework;Assist for transportation;Help with stairs or ramp for entrance   Can travel by private vehicle     Yes  Equipment Recommendations  Rolling walker (2 wheels);BSC/3in1    Recommendations for Other Services       Precautions / Restrictions Precautions Precautions: Fall Restrictions Weight Bearing Restrictions: Yes RLE Weight Bearing: Weight bearing as tolerated     Mobility  Bed Mobility Overal bed mobility: Needs Assistance Bed Mobility: Supine to Sit     Supine to sit: Supervision     General bed mobility comments: Able to progress to edge of bed without physical assist    Transfers Overall transfer level: Needs assistance Equipment used: Rolling walker (2 wheels) Transfers: Sit to/from Stand Sit to Stand: Min assist           General transfer comment: MinA to rise and stedy    Ambulation/Gait Ambulation/Gait assistance: Min assist Gait  Distance (Feet): 3 Feet Assistive device: Rolling walker (2 wheels) Gait Pattern/deviations: Step-to pattern, Decreased stance time - right, Decreased weight shift to right, Antalgic Gait velocity: decreased Gait velocity interpretation: <1.31 ft/sec, indicative of household ambulator   General Gait Details: Pivotal steps from bed to chair, minA for dynamic balance   Stairs             Wheelchair Mobility     Tilt Bed    Modified Rankin (Stroke Patients Only)       Balance Overall balance assessment: Needs assistance Sitting-balance support: Feet supported Sitting balance-Leahy Scale: Good     Standing balance support: Bilateral upper extremity supported Standing balance-Leahy Scale: Poor                              Cognition Arousal: Alert Behavior During Therapy: Anxious Overall Cognitive Status: No family/caregiver present to determine baseline cognitive functioning                                 General Comments: Follows one step commands, anxious        Exercises      General Comments        Pertinent Vitals/Pain Pain Assessment Pain Assessment: Faces Faces Pain Scale: Hurts a little bit Pain Location: R hip Pain Descriptors / Indicators: Operative site guarding, Grimacing Pain Intervention(s): Monitored during session    Home Living  Prior Function            PT Goals (current goals can now be found in the care plan section) Acute Rehab PT Goals Patient Stated Goal: did not state Potential to Achieve Goals: Good Progress towards PT goals: Progressing toward goals    Frequency    Min 1X/week      PT Plan      Co-evaluation              AM-PAC PT "6 Clicks" Mobility   Outcome Measure  Help needed turning from your back to your side while in a flat bed without using bedrails?: A Little Help needed moving from lying on your back to sitting on the side of a  flat bed without using bedrails?: A Little Help needed moving to and from a bed to a chair (including a wheelchair)?: A Little Help needed standing up from a chair using your arms (e.g., wheelchair or bedside chair)?: A Little Help needed to walk in hospital room?: A Little Help needed climbing 3-5 steps with a railing? : Total 6 Click Score: 16    End of Session Equipment Utilized During Treatment: Gait belt Activity Tolerance: Patient tolerated treatment well Patient left: in chair;with call bell/phone within reach;with chair alarm set Nurse Communication: Mobility status PT Visit Diagnosis: Other abnormalities of gait and mobility (R26.89);Unsteadiness on feet (R26.81);History of falling (Z91.81);Pain Pain - Right/Left: Right Pain - part of body: Hip     Time: 1210-1230 PT Time Calculation (min) (ACUTE ONLY): 20 min  Charges:    $Therapeutic Activity: 8-22 mins PT General Charges $$ ACUTE PT VISIT: 1 Visit                     Lillia Pauls, PT, DPT Acute Rehabilitation Services Office [phone]    [name] 10/03/2023, 12:58 PM

## 2023-10-03 NOTE — Care Management Important Message (Signed)
Important Message  Patient Details  Name: [name] MRN: [id number] Date of Birth: [birth year]   Important Message Given:  Yes - Medicare IM     Sherilyn Banker 10/03/2023, 3:13 PM

## 2023-10-03 NOTE — Progress Notes (Signed)
Patient ID: Debbie Wells, female   DOB: 1941/10/22, 82 y.o.   MRN: [id number] There has been no acute changes over the weekend for Mrs. Debbie Wells.  Her right operative hip is stable.  The dressing was clean and dry but I still changed this morning.  The incision is intact.  From an orthopedic standpoint, she can continue to mobilize with therapy and being up with assistance and weightbearing as tolerated on her right hip.  Tomorrow will be 1 week since surgery.  We will need to see her in the office as an outpatient next week.

## 2023-10-04 ENCOUNTER — Non-Acute Institutional Stay (SKILLED_NURSING_FACILITY): Payer: Medicare PPO | Admitting: Nurse Practitioner

## 2023-10-04 ENCOUNTER — Encounter: Payer: Self-pay | Admitting: Nurse Practitioner

## 2023-10-04 DIAGNOSIS — I10 Essential (primary) hypertension: Secondary | ICD-10-CM

## 2023-10-04 DIAGNOSIS — E039 Hypothyroidism, unspecified: Secondary | ICD-10-CM | POA: Diagnosis not present

## 2023-10-04 DIAGNOSIS — M25551 Pain in right hip: Secondary | ICD-10-CM | POA: Insufficient documentation

## 2023-10-04 DIAGNOSIS — G4701 Insomnia due to medical condition: Secondary | ICD-10-CM | POA: Diagnosis not present

## 2023-10-04 DIAGNOSIS — N952 Postmenopausal atrophic vaginitis: Secondary | ICD-10-CM | POA: Insufficient documentation

## 2023-10-04 DIAGNOSIS — G9009 Other idiopathic peripheral autonomic neuropathy: Secondary | ICD-10-CM | POA: Diagnosis not present

## 2023-10-04 DIAGNOSIS — E871 Hypo-osmolality and hyponatremia: Secondary | ICD-10-CM | POA: Diagnosis not present

## 2023-10-04 DIAGNOSIS — E440 Moderate protein-calorie malnutrition: Secondary | ICD-10-CM | POA: Diagnosis not present

## 2023-10-04 DIAGNOSIS — F332 Major depressive disorder, recurrent severe without psychotic features: Secondary | ICD-10-CM | POA: Diagnosis not present

## 2023-10-04 DIAGNOSIS — G609 Hereditary and idiopathic neuropathy, unspecified: Secondary | ICD-10-CM

## 2023-10-04 DIAGNOSIS — S72001D Fracture of unspecified part of neck of right femur, subsequent encounter for closed fracture with routine healing: Secondary | ICD-10-CM | POA: Diagnosis not present

## 2023-10-04 DIAGNOSIS — N1831 Chronic kidney disease, stage 3a: Secondary | ICD-10-CM | POA: Diagnosis not present

## 2023-10-04 DIAGNOSIS — K5909 Other constipation: Secondary | ICD-10-CM

## 2023-10-04 DIAGNOSIS — S72001S Fracture of unspecified part of neck of right femur, sequela: Secondary | ICD-10-CM | POA: Diagnosis not present

## 2023-10-04 DIAGNOSIS — F339 Major depressive disorder, recurrent, unspecified: Secondary | ICD-10-CM | POA: Diagnosis not present

## 2023-10-04 DIAGNOSIS — N390 Urinary tract infection, site not specified: Secondary | ICD-10-CM | POA: Diagnosis not present

## 2023-10-04 DIAGNOSIS — R41841 Cognitive communication deficit: Secondary | ICD-10-CM | POA: Diagnosis not present

## 2023-10-04 DIAGNOSIS — R2689 Other abnormalities of gait and mobility: Secondary | ICD-10-CM | POA: Diagnosis not present

## 2023-10-04 DIAGNOSIS — S72001A Fracture of unspecified part of neck of right femur, initial encounter for closed fracture: Secondary | ICD-10-CM | POA: Diagnosis not present

## 2023-10-04 DIAGNOSIS — F5101 Primary insomnia: Secondary | ICD-10-CM | POA: Diagnosis not present

## 2023-10-04 NOTE — Assessment & Plan Note (Signed)
Stable, on Colace prn, Senokot

## 2023-10-04 NOTE — Assessment & Plan Note (Signed)
salt tabs, Na 135 10/01/23

## 2023-10-04 NOTE — TOC Transition Note (Addendum)
Transition of Care [facility]) - CM/SW Discharge Note   Patient Details  Name: Debbie Wells MRN: [id number] Date of Birth: 1941/03/29  Transition of Care [facility]) CM/SW Contact:  Lorri Frederick, LCSW Phone Number: 10/04/2023, 10:42 AM   Clinical Narrative:   Pt discharging to [facility].  RN call report to [phone].  Daughter will provide transportation and will need pt brought down to main north tower entrance with assistance getting into the vehicle.  Daughter supposed to be here around 1145.  Final next level of care: Skilled Nursing Facility Barriers to Discharge: Barriers Resolved   Patient Goals and CMS Choice      Discharge Placement                Patient chooses bed at:  [facility]) Patient to be transferred to facility by: son Jodi Geralds has spoken with him Name of family member notified: left message with son Chrissie Noa Patient and family notified of of transfer: 10/04/23  Discharge Plan and Services Additional resources added to the After Visit Summary for                                       Social Determinants of Health (SDOH) Interventions SDOH Screenings   Food Insecurity: No Food Insecurity (09/27/2023)  Housing: Low Risk  (09/27/2023)  Transportation Needs: No Transportation Needs (09/27/2023)  Utilities: Not At Risk (09/27/2023)  Depression (PHQ2-9): Low Risk  (03/17/2023)  Financial Resource Strain: Low Risk  (10/03/2020)   Received from [facility]  Social Connections: Unknown (03/16/2022)   Received from [facility], [facility]  Stress: Stress Concern Present (10/03/2020)   Received from [facility], [facility]  Tobacco Use: Medium Risk (09/27/2023)     Readmission Risk Interventions     No data to display

## 2023-10-04 NOTE — Assessment & Plan Note (Signed)
taking Levothyroxine, TSH 0.77 09/23/23

## 2023-10-04 NOTE — TOC Progression Note (Addendum)
Transition of Care [facility]) - Progression Note    Patient Details  Name: Debbie Wells MRN: [id number] Date of Birth: Apr 10, 1941  [facility]) CM/SW Contact  Lorri Frederick, LCSW Phone Number: 10/04/2023, 8:56 AM  Clinical Narrative:   SNF auth approved: [id number], [id number], 3 days: 12/2-12/4.  MD notified.   LM with [facility] with passr.  CSW spoke with pt son Chrissie Noa.  His sister will be providing transportation at 1145.      Expected Discharge Plan and Services         Expected Discharge Date: 10/03/23                                     Social Determinants of Health (SDOH) Interventions SDOH Screenings   Food Insecurity: No Food Insecurity (09/27/2023)  Housing: Low Risk  (09/27/2023)  Transportation Needs: No Transportation Needs (09/27/2023)  Utilities: Not At Risk (09/27/2023)  Depression (PHQ2-9): Low Risk  (03/17/2023)  Financial Resource Strain: Low Risk  (10/03/2020)   Received from [facility], [facility]  Social Connections: Unknown (03/16/2022)   Received from [facility], [facility]  Stress: Stress Concern Present (10/03/2020)   Received from [facility]  Tobacco Use: Medium Risk (09/27/2023)    Readmission Risk Interventions     No data to display

## 2023-10-04 NOTE — Progress Notes (Unsigned)
Location:   SNF [facility] Room Number: 59 Place of Service:  SNF (31) Provider: Arna Snipe Azari Hasler NP  Mahlon Gammon, MD  Patient Care Team: Mahlon Gammon, MD as PCP - General (Internal Medicine) Jens Som Madolyn Frieze, MD as PCP - Cardiology (Cardiology) Duke, Roe Rutherford, PA as Physician Assistant (Cardiology)  Extended Emergency Contact Information Primary Emergency Contact: [facility] Phone: [phone] Relation: Son  Code Status: DNR Goals of care: Advanced Directive information    10/04/2023   11:49 AM  Advanced Directives  Does Patient Have a Medical Advance Directive? No  Does patient want to make changes to medical advance directive? No - Patient declined  Would patient like information on creating a medical advance directive? No - Patient declined     Chief Complaint  Patient presents with  . Acute Visit    Medication review following hospital stay.     HPI:  Pt is a 82 y.o. female seen today for an acute visit for medication review following hospital stay.   Hospitalized 09/26/23-10/04/23 for closed R hip fracture, s/p ORIF, ASA 81mg  bid for DVT risk reduction, f/u Ortho 1 week, prn Tylenol, Tramadol for pain.   Peripheral neuropathy, on Gabapentin  HTN, on Amlodipine, Losartan, Metoprolol, prn Hydralazine  CKD, Bun/creat 12/0.78 10/01/23  Depression, Alprazolam, Trazodone, Buspirone, Duloxetine, Mirtazapine, Olanzapine  Constipation, on Colace prn, Senokot   Atrophic vaginitis, Estradiol vaginal 2x/wk  Hyponatremia, salt tabs, Na 135 10/01/23  Hypothyroidism, taking Levothyroxine, TSH 0.77 09/23/23  GERD, Hgb 10.8 10/01/23 Past Medical History:  Diagnosis Date  . Adenomatous colon polyp   . Adenomatous polyp of colon 12/03/2004   6mm  . Anxiety   . Chronic headaches   . Chronic insomnia   . Depression   . Diverticulosis   . External hemorrhoids   . Hyperlipidemia   . Hypertension   . Hyperthyroidism   . Hypothyroidism   . Umbilical  hernia    Past Surgical History:  Procedure Laterality Date  . ABDOMINAL HYSTERECTOMY  1975  . APPENDECTOMY  1975  . COLONOSCOPY  03/03/2010,12/03/2004  . ESOPHAGOGASTRODUODENOSCOPY  12/16/11   Dr. Stan Head  . HIP ARTHROPLASTY Right 09/27/2023   Procedure: ARTHROPLASTY BIPOLAR HIP (HEMIARTHROPLASTY);  Surgeon: Kathryne Hitch, MD;  Location: [facility] OR;  Service: Orthopedics;  Laterality: Right;  . OOPHORECTOMY    . spinal lipoma removed  2006  . TOTAL ABDOMINAL HYSTERECTOMY      Allergies  Allergen Reactions  . Hydrocodone-Acetaminophen Rash    Vicodin=REACTION: rash  Not listed on the [facility]  . Iodinated Contrast Media Hives and Rash    Per patient she has developed hives and itching in upper trunk of body after last 2 CT scans once she got homes that she has just treated at home with benadryl. She has not told anyone until now. States she was able to bear it  Other reaction(s): Rash Per patient she has developed hives and itching in upper trunk of body after last 2 CT scans once she got homes that she has just treated at home with benadryl. She has not told anyone until now. States she was able to bear it   Not listed on the [facility]   . Sulfa Antibiotics Rash    REACTION: rash Other reaction(s): Unknown  . Codeine     Unknown reaction  Not listed on the [facility]   . Sulfonamide Derivatives     REACTION: rash  . Terbinafine     Unknown reaction  Not listed on the [facility]   . Benazepril Cough    Not listed on the [facility]     Allergies as of 10/04/2023       Reactions   Hydrocodone-acetaminophen Rash   Vicodin=REACTION: rash Not listed on the [facility]   Iodinated Contrast Media Hives, Rash   Per patient she has developed hives and itching in upper trunk of body after last 2 CT scans once she got homes that she has just treated at home with benadryl. She has not told anyone until now. States she was able to bear it  Other reaction(s): Rash Per patient she has developed hives and  itching in upper trunk of body after last 2 CT scans once she got homes that she has just treated at home with benadryl. She has not told anyone until now. States she was able to bear it  Not listed on the [facility]   Sulfa Antibiotics Rash   REACTION: rash Other reaction(s): Unknown   Codeine    Unknown reaction  Not listed on the [facility]   Sulfonamide Derivatives    REACTION: rash   Terbinafine    Unknown reaction Not listed on the [facility]   Benazepril Cough   Not listed on the [facility]        Medication List        Accurate as of October 04, 2023  4:35 PM. If you have any questions, ask your nurse or doctor.          acetaminophen 325 MG tablet Commonly known as: TYLENOL Take 650 mg by mouth every 4 (four) hours as needed (for pain).   ALPRAZolam 0.5 MG tablet Commonly known as: XANAX Take 1 tablet (0.5 mg total) by mouth 3 (three) times daily as needed.   amLODipine 2.5 MG tablet Commonly known as: NORVASC Take 2.5 mg by mouth daily.   aspirin 81 MG chewable tablet Chew 1 tablet (81 mg total) by mouth 2 (two) times daily.   busPIRone 15 MG tablet Commonly known as: BUSPAR Take 15 mg by mouth 2 (two) times daily.   Docusate Sodium 100 MG capsule Take 100 mg by mouth as needed for constipation.   DULoxetine 60 MG capsule Commonly known as: CYMBALTA Take 60 mg by mouth daily.   estradiol 0.1 MG/GM vaginal cream Commonly known as: ESTRACE Place 1 Applicatorful vaginally 2 (two) times a week. Tuesday and Friday   feeding supplement Liqd Take 237 mLs by mouth 2 (two) times daily between meals.   gabapentin 100 MG capsule Commonly known as: NEURONTIN Take 200 mg by mouth 2 (two) times daily.   levothyroxine 100 MCG tablet Commonly known as: SYNTHROID Take 100 mcg by mouth daily.   losartan 50 MG tablet Commonly known as: COZAAR Take 50 mg by mouth 2 (two) times daily.   metoprolol succinate 12.5 mg Tb24 24 hr tablet Commonly known as: TOPROL-XL Take 12.5 mg by  mouth daily.   mirtazapine 7.5 MG tablet Commonly known as: REMERON Take 7.5 mg by mouth at bedtime.   OLANZapine 5 MG tablet Commonly known as: ZYPREXA Take 5 mg by mouth daily.   ondansetron 8 MG tablet Commonly known as: ZOFRAN Take 8 mg by mouth as needed for nausea or vomiting.   polyethylene glycol 17 g packet Commonly known as: MiraLax Take 17 g by mouth daily.   saccharomyces boulardii 250 MG capsule Commonly known as: FLORASTOR Take 250 mg by mouth 2 (two) times daily.   senna 8.6 MG  Tabs tablet Commonly known as: SENOKOT Take 1 tablet by mouth daily. May also take 2 tablets by mouth as needed for constipation   sodium chloride 1 g tablet Take 1 g by mouth 3 (three) times daily.   traMADol 50 MG tablet Commonly known as: ULTRAM Take 1-2 tablets (50-100 mg total) by mouth every 6 (six) hours as needed for moderate pain (pain score 4-6).   traZODone 50 MG tablet Commonly known as: DESYREL Take 50 mg by mouth at bedtime as needed for sleep.        Review of Systems  Constitutional:  Negative for appetite change, fatigue and fever.  HENT:  Negative for congestion and trouble swallowing.   Eyes:  Negative for visual disturbance.  Respiratory:  Negative for cough and shortness of breath.   Cardiovascular:  Positive for leg swelling.  Gastrointestinal:  Negative for abdominal pain, constipation, nausea and vomiting.  Genitourinary:  Positive for frequency. Negative for dysuria and urgency.  Musculoskeletal:  Positive for arthralgias and gait problem.  Skin:  Negative for color change.  Neurological:  Negative for weakness and headaches.  Psychiatric/Behavioral:  Negative for behavioral problems and sleep disturbance. The patient is nervous/anxious.     Immunization History  Administered Date(s) Administered  . Influenza, High Dose Seasonal PF 08/04/2017, 07/27/2018, 07/27/2020  . Influenza,inj,Quad PF,6-35 Mos 12/26/2019  . PFIZER Comirnaty(Gray  Top)Covid-19 Tri-Sucrose Vaccine 04/22/2021  . PFIZER(Purple Top)SARS-COV-2 Vaccination 11/17/2019, 12/06/2019  . PPD Test 10/13/2020  . [employer], [employer] 62yrs & up 08/27/2021  . Tdap 02/15/2022  . Zoster Recombinant(Shingrix) 01/18/2018, 03/21/2018, 04/20/2018   Pertinent  Health Maintenance Due  Topic Date Due  . DEXA SCAN  Never done  . INFLUENZA VACCINE  06/02/2023      09/21/2020    3:34 PM 09/26/2020    3:57 PM 08/07/2021    1:56 PM 02/15/2022    2:38 PM 03/17/2023    3:02 PM  Fall Risk  Falls in the past year?     0  Was there an injury with Fall?     0  Fall Risk Category Calculator     0  (RETIRED) Patient Fall Risk Level Low fall risk High fall risk Low fall risk Moderate fall risk   Patient at Risk for Falls Due to     No Fall Risks  Fall risk Follow up     Falls evaluation completed   Functional Status Survey:    There were no vitals filed for this visit. There is no height or weight on file to calculate BMI. Physical Exam Vitals and nursing note reviewed.  Constitutional:      Appearance: Normal appearance.  HENT:     Nose: Nose normal.     Mouth/Throat:     Mouth: Mucous membranes are moist.  Eyes:     Extraocular Movements: Extraocular movements intact.     Pupils: Pupils are equal, round, and reactive to light.  Cardiovascular:     Rate and Rhythm: Normal rate.     Heart sounds: No murmur heard. Pulmonary:     Breath sounds: No rales.  Abdominal:     General: Bowel sounds are normal.     Tenderness: There is no abdominal tenderness.  Musculoskeletal:        General: Tenderness present.     Cervical back: Normal range of motion.     Right lower leg: Edema present.     Left lower leg: No edema.  Skin:  General: Skin is warm and dry.     Comments: R hip surgical site is covered with dressing, clean  Neurological:     General: No focal deficit present.     Mental Status: She is alert and oriented to person, place, and  time. Mental status is at baseline.     Motor: No weakness.     Gait: Gait abnormal.  Psychiatric:        Mood and Affect: Mood normal.    Labs reviewed: Recent Labs    06/13/23 0403 06/14/23 0357 06/15/23 0340 08/07/23 1245 09/10/23 1823 09/23/23 0000 09/29/23 0524 09/30/23 0614 10/01/23 0340  NA 128* 131* 133*   < > 130*   < > 132* 134* 135  K 4.0 4.0 3.8   < > 4.3   < > 3.9 3.6 3.7  CL 99 100 102   < > 96*   < > 104 101 104  CO2 21* 25 25   < > 25   < > 25 23 25   GLUCOSE 92 92 95   < > 91   < > 89 89 108*  BUN 14 12 11    < > 10   < > 18 16 12   CREATININE 1.36* 0.93 0.73   < > 1.02*   < > 1.00 0.79 0.78  CALCIUM 9.2 8.8* 8.7*   < > 10.3   < > 8.7* 9.0 9.0  MG 2.3 2.0 2.0  --  1.9  --   --   --   --   PHOS 3.9 3.3 3.6  --   --   --   --   --   --    < > = values in this interval not displayed.   Recent Labs    08/07/23 1245 09/08/23 1532 09/23/23 0000 09/26/23 2043  AST 21 21 19 29   ALT 15 18 14 23   ALKPHOS 60 76 76 82  BILITOT 1.0 0.7  --  0.7  PROT 6.6 7.3  --  6.6  ALBUMIN 4.0 4.3 3.7 3.6   Recent Labs    09/08/23 1532 09/10/23 1823 09/23/23 0000 09/26/23 2043 09/27/23 0607 09/29/23 0524 09/30/23 0614 10/01/23 0340  WBC 5.5   < > 5.3 5.2   < > 9.9 8.5 8.4  NEUTROABS 3.8  --  3,058.00 3.5  --   --   --   --   HGB 11.5*   < > 11.1* 11.8*   < > 9.3* 10.2* 10.8*  HCT 34.4*   < > 33* 36.3   < > 27.6* 30.0* 32.6*  MCV 94.2   < >  --  96.0   < > 92.3 91.7 90.8  PLT 239   < > 223 216   < > 172 192 227   < > = values in this interval not displayed.   Lab Results  Component Value Date   TSH 0.77 09/23/2023   No results found for: "HGBA1C" Lab Results  Component Value Date   CHOL 191 09/23/2023   HDL 56 09/23/2023   LDLCALC 112 09/23/2023   TRIG 118 09/23/2023    Significant Diagnostic Results in last 30 days:  US RENAL  Result Date: 09/27/2023 CLINICAL DATA:  Acute kidney injury EXAM: RENAL / URINARY TRACT ULTRASOUND COMPLETE COMPARISON:  CT  08/07/2023 FINDINGS: Right Kidney: Renal measurements: 9.4 x 4.8 x 4.6 cm = volume: 108.5 mL. Echogenicity within normal limits. Mild right hydronephrosis. Left Kidney: Renal measurements: 9.4 x  5.2 x 4 cm = volume: 102.1 mL. Echogenicity within normal limits. No mass or hydronephrosis. Bladder: Catheter in the bladder.  Moderate to marked bladder distension Other: None. IMPRESSION: 1. Mild right hydronephrosis. 2. Moderate to marked bladder distension with catheter in place. Correlate for catheter function Electronically Signed   By: Jasmine Pang M.D.   On: 09/27/2023 23:33   DG Pelvis Portable  Result Date: 09/27/2023 CLINICAL DATA:  Postop. EXAM: PORTABLE PELVIS 1-2 VIEWS COMPARISON:  Preoperative imaging FINDINGS: Right hip hemiarthroplasty in expected alignment. No periprosthetic lucency or fracture. Recent postsurgical change includes air and edema in the soft tissues. Lateral skin staples in place. IMPRESSION: Right hip hemiarthroplasty without immediate postoperative complication. Electronically Signed   By: Narda Rutherford M.D.   On: 09/27/2023 17:36   DG HIP UNILAT WITH PELVIS 1V RIGHT  Result Date: 09/27/2023 CLINICAL DATA:  [id number] Surgery, elective [id number] EXAM: DG HIP (WITH OR WITHOUT PELVIS) 1V RIGHT COMPARISON:  Pre-Operative imaging FINDINGS: Four fluoroscopic spot views of the pelvis and right hip obtained in the operating room. Images during hip arthroplasty. Fluoroscopy time 14 seconds. Dose 0.89 mGy. IMPRESSION: Intraoperative fluoroscopy during right hip arthroplasty. Electronically Signed   By: Narda Rutherford M.D.   On: 09/27/2023 17:36   DG C-Arm 1-60 Min-No Report  Result Date: 09/27/2023 Fluoroscopy was utilized by the requesting physician.  No radiographic interpretation.   DG FEMUR PORT, MIN 2 VIEWS RIGHT  Result Date: 09/27/2023 CLINICAL DATA:  Right hip fracture EXAM: RIGHT FEMUR PORTABLE 2 VIEW COMPARISON:  None Available. FINDINGS: Two view radiograph of the  right femur again demonstrates an impacted subcapital right femoral neck fracture. No dislocation. Right hip joint space preserved. The distal fracture fragment consisting of the femoral shaft is intact. Limited images of the right knee are unremarkable. IMPRESSION: 1. Impacted subcapital right femoral neck fracture. Distal femur appears intact. Electronically Signed   By: Helyn Numbers M.D.   On: 09/27/2023 00:13   DG CHEST PORT 1 VIEW  Result Date: 09/27/2023 CLINICAL DATA:  Right hip fracture. Medical clearance for surgical intervention EXAM: PORTABLE CHEST 1 VIEW COMPARISON:  None Available. FINDINGS: The heart size and mediastinal contours are within normal limits. Both lungs are clear. The visualized skeletal structures are unremarkable. IMPRESSION: No active disease. Electronically Signed   By: Helyn Numbers M.D.   On: 09/27/2023 00:12   DG Hip Unilat W or Wo Pelvis 2-3 Views Right  Result Date: 09/26/2023 CLINICAL DATA:  Fall EXAM: DG HIP (WITH OR WITHOUT PELVIS) 2-3V RIGHT COMPARISON:  None Available. FINDINGS: There is an acute, impacted subcapital right femoral neck fracture. The femoral head is still seated within the right acetabulum and right hip joint space is preserved. The pelvis and visualized left hip are intact. Sacroiliac and left hip joint spaces are preserved. Soft tissues are unremarkable. IMPRESSION: 1. Acute, impacted subcapital right femoral neck fracture. Electronically Signed   By: Helyn Numbers M.D.   On: 09/26/2023 22:02   CT Head Wo Contrast  Result Date: 09/08/2023 CLINICAL DATA:  Fall, head injury, pain EXAM: CT HEAD WITHOUT CONTRAST CT CERVICAL SPINE WITHOUT CONTRAST TECHNIQUE: Multidetector CT imaging of the head and cervical spine was performed following the standard protocol without intravenous contrast. Multiplanar CT image reconstructions of the cervical spine were also generated. RADIATION DOSE REDUCTION: This exam was performed according to the departmental  dose-optimization program which includes automated exposure control, adjustment of the mA and/or kV according to patient size and/or use of iterative  reconstruction technique. COMPARISON:  02/15/2022 FINDINGS: CT HEAD FINDINGS Brain: No evidence of acute infarction, hemorrhage, hydrocephalus, extra-axial collection or mass lesion/mass effect. Periventricular and deep white matter hypodensity. Mild global cerebral volume loss. Vascular: No hyperdense vessel or unexpected calcification. Skull: Normal. Negative for fracture or focal lesion. Sinuses/Orbits: No acute finding. Other: None. CT CERVICAL SPINE FINDINGS Alignment: Normal. Skull base and vertebrae: No acute fracture. No primary bone lesion or focal pathologic process. Soft tissues and spinal canal: No prevertebral fluid or swelling. No visible canal hematoma. Disc levels: Focally mild disc space height loss and osteophytosis of C5-C6 with otherwise intact disc spaces. Upper chest: Negative. Other: None. IMPRESSION: 1. No acute intracranial pathology. 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 2. No fracture or static subluxation of the cervical spine. 3. Focally mild disc space height loss and osteophytosis of C5-C6 with otherwise intact disc spaces. Electronically Signed   By: Jearld Lesch M.D.   On: 09/08/2023 13:53   CT Cervical Spine Wo Contrast  Result Date: 09/08/2023 CLINICAL DATA:  Fall, head injury, pain EXAM: CT HEAD WITHOUT CONTRAST CT CERVICAL SPINE WITHOUT CONTRAST TECHNIQUE: Multidetector CT imaging of the head and cervical spine was performed following the standard protocol without intravenous contrast. Multiplanar CT image reconstructions of the cervical spine were also generated. RADIATION DOSE REDUCTION: This exam was performed according to the departmental dose-optimization program which includes automated exposure control, adjustment of the mA and/or kV according to patient size and/or use of iterative  reconstruction technique. COMPARISON:  02/15/2022 FINDINGS: CT HEAD FINDINGS Brain: No evidence of acute infarction, hemorrhage, hydrocephalus, extra-axial collection or mass lesion/mass effect. Periventricular and deep white matter hypodensity. Mild global cerebral volume loss. Vascular: No hyperdense vessel or unexpected calcification. Skull: Normal. Negative for fracture or focal lesion. Sinuses/Orbits: No acute finding. Other: None. CT CERVICAL SPINE FINDINGS Alignment: Normal. Skull base and vertebrae: No acute fracture. No primary bone lesion or focal pathologic process. Soft tissues and spinal canal: No prevertebral fluid or swelling. No visible canal hematoma. Disc levels: Focally mild disc space height loss and osteophytosis of C5-C6 with otherwise intact disc spaces. Upper chest: Negative. Other: None. IMPRESSION: 1. No acute intracranial pathology. 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 2. No fracture or static subluxation of the cervical spine. 3. Focally mild disc space height loss and osteophytosis of C5-C6 with otherwise intact disc spaces. Electronically Signed   By: Jearld Lesch M.D.   On: 09/08/2023 13:53    Assessment/Plan: Right hip pain Hospitalized 09/26/23-10/04/23 for closed R hip fracture, s/p ORIF, ASA 81mg  bid for DVT risk reduction, f/u Ortho 1 week, prn Tylenol, Tramadol for pain.   Hereditary and idiopathic neuropathy, unspecified Continue Gabapentin.   Essential (primary) hypertension Blood pressure is controlled,  on Amlodipine, Losartan, Metoprolol, prn Hydralazine  Chronic kidney disease, stage 3a (HCC) Bun/creat 12/0.78 10/01/23  Major depressive disorder, recurrent severe without psychotic features (HCC) Appears anxious,  Alprazolam, Trazodone, Buspirone, Duloxetine, Mirtazapine, Olanzapine  Chronic constipation Stable, on Colace prn, Senokot   Atrophic vaginitis Estradiol vaginal 2x/wk  Hyponatremia salt tabs, Na 135  10/01/23    Family/ staff Communication: plan of care reviewed with the patient and charge nurse.   Labs/tests ordered:  none  Time spend 30 minutes.

## 2023-10-04 NOTE — Plan of Care (Signed)
  Problem: Education: Goal: Knowledge of General Education information will improve Description: Including pain rating scale, medication(s)/side effects and non-pharmacologic comfort measures Outcome: Progressing   Problem: Health Behavior/Discharge Planning: Goal: Ability to manage health-related needs will improve Outcome: Progressing   Problem: Clinical Measurements: Goal: Ability to maintain clinical measurements within normal limits will improve Outcome: Progressing Goal: Will remain free from infection Outcome: Progressing Goal: Diagnostic test results will improve Outcome: Progressing Goal: Respiratory complications will improve Outcome: Progressing Goal: Cardiovascular complication will be avoided Outcome: Progressing   Problem: Activity: Goal: Risk for activity intolerance will decrease Outcome: Progressing   Problem: Coping: Goal: Level of anxiety will decrease Outcome: Progressing   Problem: Elimination: Goal: Will not experience complications related to bowel motility Outcome: Progressing Goal: Will not experience complications related to urinary retention Outcome: Progressing   Problem: Pain Management: Goal: General experience of comfort will improve Outcome: Progressing   Problem: Safety: Goal: Ability to remain free from injury will improve Outcome: Progressing   Problem: Skin Integrity: Goal: Risk for impaired skin integrity will decrease Outcome: Progressing   Problem: Education: Goal: Knowledge of the prescribed therapeutic regimen will improve Outcome: Progressing Goal: Understanding of discharge needs will improve Outcome: Progressing Goal: Individualized Educational Video(s) Outcome: Progressing   Problem: Activity: Goal: Ability to avoid complications of mobility impairment will improve Outcome: Progressing Goal: Ability to tolerate increased activity will improve Outcome: Progressing   Problem: Clinical Measurements: Goal:  Postoperative complications will be avoided or minimized Outcome: Progressing   Problem: Pain Management: Goal: Pain level will decrease with appropriate interventions Outcome: Progressing   Problem: Skin Integrity: Goal: Will show signs of wound healing Outcome: Progressing

## 2023-10-04 NOTE — Assessment & Plan Note (Signed)
Blood pressure is controlled,  on Amlodipine, Losartan, Metoprolol, prn Hydralazine

## 2023-10-04 NOTE — Assessment & Plan Note (Signed)
Appears anxious,  Alprazolam, Trazodone, Buspirone, Duloxetine, Mirtazapine, Olanzapine

## 2023-10-04 NOTE — Assessment & Plan Note (Signed)
Bun/creat 12/0.78 10/01/23

## 2023-10-04 NOTE — Assessment & Plan Note (Signed)
Continue Gabapentin

## 2023-10-04 NOTE — Assessment & Plan Note (Signed)
Estradiol vaginal 2x/wk

## 2023-10-04 NOTE — Discharge Summary (Signed)
Physician Discharge Summary  Debbie Wells UEA:[id number] DOB: 18-Apr-1941 DOA: 09/26/2023  PCP: Mahlon Gammon, MD  Admit date: 09/26/2023 Discharge date: 10/04/2023  Admitted From: ILF/ALF Discharge disposition: SNF   Recommendations for Outpatient Follow-Up:   Cbc/bmp1 week Follow up with ortho 1 week-baby aspirin twice daily for DVT coverage  Consider palliative care consult for GOC/code status discussion   Discharge Diagnosis:   Principal Problem:   Closed right hip fracture, initial encounter (HCC) Active Problems:   Hyponatremia   Essential (primary) hypertension   Chronic kidney disease, stage 3a (HCC)   Major depressive disorder, recurrent severe without psychotic features (HCC)   Hypothyroidism   Anxiety   Malnutrition of moderate degree    Discharge Condition: Improved.  Diet recommendation:  Regular.  Wound care: None.  Code status: Full.   History of Present Illness:   Debbie Wells is a 82 y.o. female with medical history significant for depression, anxiety, insomnia, hypothyroidism, and hypertension who presents with severe right hip pain after a fall.    Patient was in her usual state having an uneventful day when she was carrying a tray, tripped, and fell to the ground.  She has been experiencing severe right hip pain ever since.  She denies loss of consciousness or any other appreciable injuries.  She states that she typically uses a walker but was trying to carry something with both hands when she fell.  She denies history of heart disease and never experiences chest discomfort with activity.     Hospital Course by Problem:   Right hip displaced subcapital femoral neck fracture -Underwent right hip hemiarthroplasty 11/26 -Continue pain control, supportive care- scheduled tylenol -DVT prophylaxis with aspirin    Hyponatremia  -stable with salt tablets    Hypertension  - Continue amlodipine and Toprol  -Continue hydralazine as  needed   Depression, anxiety, insomnia  - Continue Buspar, Remeron, Cymbalta, Zyprexa, trazodone, and as-needed Xanax   -follows with Dr. Chales Abrahams-- recent medication changes about 1 week ago   Hypothyroidism  - Synthroid      CKD 3A  -AKI resolved.      Medical Consultants:    ortho  Discharge Exam:   Vitals:   10/04/23 0505  BP: 124/70  Pulse: 74  Resp: 18  Temp: 98.2 F (36.8 C)  SpO2: 97%     General exam: Appears calm and comfortable.-- up in chair   The results of significant diagnostics from this hospitalization (including imaging, microbiology, ancillary and laboratory) are listed below for reference.     Procedures and Diagnostic Studies:   US RENAL  Result Date: 09/27/2023 CLINICAL DATA:  Acute kidney injury EXAM: RENAL / URINARY TRACT ULTRASOUND COMPLETE COMPARISON:  CT 08/07/2023 FINDINGS: Right Kidney: Renal measurements: 9.4 x 4.8 x 4.6 cm = volume: 108.5 mL. Echogenicity within normal limits. Mild right hydronephrosis. Left Kidney: Renal measurements: 9.4 x 5.2 x 4 cm = volume: 102.1 mL. Echogenicity within normal limits. No mass or hydronephrosis. Bladder: Catheter in the bladder.  Moderate to marked bladder distension Other: None. IMPRESSION: 1. Mild right hydronephrosis. 2. Moderate to marked bladder distension with catheter in place. Correlate for catheter function Electronically Signed   By: Jasmine Pang M.D.   On: 09/27/2023 23:33   DG Pelvis Portable  Result Date: 09/27/2023 CLINICAL DATA:  Postop. EXAM: PORTABLE PELVIS 1-2 VIEWS COMPARISON:  Preoperative imaging FINDINGS: Right hip hemiarthroplasty in expected alignment. No periprosthetic lucency or fracture. Recent postsurgical change includes  air and edema in the soft tissues. Lateral skin staples in place. IMPRESSION: Right hip hemiarthroplasty without immediate postoperative complication. Electronically Signed   By: Narda Rutherford M.D.   On: 09/27/2023 17:36   DG HIP UNILAT WITH PELVIS 1V  RIGHT  Result Date: 09/27/2023 CLINICAL DATA:  [id number] Surgery, elective [id number] EXAM: DG HIP (WITH OR WITHOUT PELVIS) 1V RIGHT COMPARISON:  Pre-Operative imaging FINDINGS: Four fluoroscopic spot views of the pelvis and right hip obtained in the operating room. Images during hip arthroplasty. Fluoroscopy time 14 seconds. Dose 0.89 mGy. IMPRESSION: Intraoperative fluoroscopy during right hip arthroplasty. Electronically Signed   By: Narda Rutherford M.D.   On: 09/27/2023 17:36   DG C-Arm 1-60 Min-No Report  Result Date: 09/27/2023 Fluoroscopy was utilized by the requesting physician.  No radiographic interpretation.   DG FEMUR PORT, MIN 2 VIEWS RIGHT  Result Date: 09/27/2023 CLINICAL DATA:  Right hip fracture EXAM: RIGHT FEMUR PORTABLE 2 VIEW COMPARISON:  None Available. FINDINGS: Two view radiograph of the right femur again demonstrates an impacted subcapital right femoral neck fracture. No dislocation. Right hip joint space preserved. The distal fracture fragment consisting of the femoral shaft is intact. Limited images of the right knee are unremarkable. IMPRESSION: 1. Impacted subcapital right femoral neck fracture. Distal femur appears intact. Electronically Signed   By: Helyn Numbers M.D.   On: 09/27/2023 00:13   DG CHEST PORT 1 VIEW  Result Date: 09/27/2023 CLINICAL DATA:  Right hip fracture. Medical clearance for surgical intervention EXAM: PORTABLE CHEST 1 VIEW COMPARISON:  None Available. FINDINGS: The heart size and mediastinal contours are within normal limits. Both lungs are clear. The visualized skeletal structures are unremarkable. IMPRESSION: No active disease. Electronically Signed   By: Helyn Numbers M.D.   On: 09/27/2023 00:12   DG Hip Unilat W or Wo Pelvis 2-3 Views Right  Result Date: 09/26/2023 CLINICAL DATA:  Fall EXAM: DG HIP (WITH OR WITHOUT PELVIS) 2-3V RIGHT COMPARISON:  None Available. FINDINGS: There is an acute, impacted subcapital right femoral neck fracture. The  femoral head is still seated within the right acetabulum and right hip joint space is preserved. The pelvis and visualized left hip are intact. Sacroiliac and left hip joint spaces are preserved. Soft tissues are unremarkable. IMPRESSION: 1. Acute, impacted subcapital right femoral neck fracture. Electronically Signed   By: Helyn Numbers M.D.   On: 09/26/2023 22:02     Labs:   Basic Metabolic Panel: Recent Labs  Lab 09/28/23 0613 09/29/23 0524 09/30/23 0614 10/01/23 0340  NA 134* 132* 134* 135  K 3.9 3.9 3.6 3.7  CL 99 104 101 104  CO2 25 25 23 25   GLUCOSE 131* 89 89 108*  BUN 7* 18 16 12   CREATININE 0.89 1.00 0.79 0.78  CALCIUM 9.5 8.7* 9.0 9.0   GFR Estimated Creatinine Clearance: 47.6 mL/min (by C-G formula based on SCr of 0.78 mg/dL). Liver Function Tests: No results for input(s): "AST", "ALT", "ALKPHOS", "BILITOT", "PROT", "ALBUMIN" in the last 168 hours.  No results for input(s): "LIPASE", "AMYLASE" in the last 168 hours. No results for input(s): "AMMONIA" in the last 168 hours. Coagulation profile No results for input(s): "INR", "PROTIME" in the last 168 hours.  CBC: Recent Labs  Lab 09/28/23 0613 09/29/23 0524 09/30/23 0614 10/01/23 0340  WBC 10.5 9.9 8.5 8.4  HGB 10.2* 9.3* 10.2* 10.8*  HCT 30.4* 27.6* 30.0* 32.6*  MCV 91.3 92.3 91.7 90.8  PLT 181 172 192 227   Cardiac Enzymes: No  results for input(s): "CKTOTAL", "CKMB", "CKMBINDEX", "TROPONINI" in the last 168 hours. BNP: Invalid input(s): "POCBNP" CBG: No results for input(s): "GLUCAP" in the last 168 hours. D-Dimer No results for input(s): "DDIMER" in the last 72 hours. Hgb A1c No results for input(s): "HGBA1C" in the last 72 hours. Lipid Profile No results for input(s): "CHOL", "HDL", "LDLCALC", "TRIG", "CHOLHDL", "LDLDIRECT" in the last 72 hours. Thyroid function studies No results for input(s): "TSH", "T4TOTAL", "T3FREE", "THYROIDAB" in the last 72 hours.  Invalid input(s): "FREET3" Anemia  work up No results for input(s): "VITAMINB12", "FOLATE", "FERRITIN", "TIBC", "IRON", "RETICCTPCT" in the last 72 hours. Microbiology Recent Results (from the past 240 hour(s))  Surgical pcr screen     Status: None   Collection Time: 09/27/23 12:31 PM   Specimen: Nasal Mucosa; Nasal Swab  Result Value Ref Range Status   MRSA, PCR NEGATIVE NEGATIVE Final   Staphylococcus aureus NEGATIVE NEGATIVE Final    Comment: (NOTE) The Xpert SA Assay (FDA approved for NASAL specimens in patients 60 years of age and older), is one component of a comprehensive surveillance program. It is not intended to diagnose infection nor to guide or monitor treatment. Performed at [facility], [address] [place], [place] [address]      Discharge Instructions:   Discharge Instructions     Diet general   Complete by: As directed    Increase activity slowly   Complete by: As directed       Allergies as of 10/04/2023       Reactions   Hydrocodone-acetaminophen Rash   Vicodin=REACTION: rash Not listed on the [facility]   Iodinated Contrast Media Hives, Rash   Per patient she has developed hives and itching in upper trunk of body after last 2 CT scans once she got homes that she has just treated at home with benadryl. She has not told anyone until now. States she was able to bear it  Other reaction(s): Rash Per patient she has developed hives and itching in upper trunk of body after last 2 CT scans once she got homes that she has just treated at home with benadryl. She has not told anyone until now. States she was able to bear it  Not listed on the [facility]   Sulfa Antibiotics Rash   REACTION: rash Other reaction(s): Unknown   Codeine    Unknown reaction  Not listed on the [facility]   Sulfonamide Derivatives    REACTION: rash   Terbinafine    Unknown reaction Not listed on the [facility]   Benazepril Cough   Not listed on the [facility]        Medication List     STOP taking these medications     ciprofloxacin 500 MG tablet Commonly known as: CIPRO   meloxicam 15 MG tablet Commonly known as: MOBIC   trimethoprim 100 MG tablet Commonly known as: TRIMPEX       TAKE these medications    acetaminophen 325 MG tablet Commonly known as: TYLENOL Take 650 mg by mouth every 4 (four) hours as needed (for pain).   ALPRAZolam 0.5 MG tablet Commonly known as: XANAX Take 1 tablet (0.5 mg total) by mouth 3 (three) times daily as needed.   amLODipine 2.5 MG tablet Commonly known as: NORVASC Take 2.5 mg by mouth daily.   aspirin 81 MG chewable tablet Chew 1 tablet (81 mg total) by mouth 2 (two) times daily.   busPIRone 15 MG tablet Commonly known as: BUSPAR Take 15  mg by mouth 2 (two) times daily.   Docusate Sodium 100 MG capsule Take 100 mg by mouth as needed for constipation.   DULoxetine 60 MG capsule Commonly known as: CYMBALTA Take 60 mg by mouth daily.   estradiol 0.1 MG/GM vaginal cream Commonly known as: ESTRACE Place 1 Applicatorful vaginally 2 (two) times a week. Tuesday and Friday   feeding supplement Liqd Take 237 mLs by mouth 2 (two) times daily between meals.   gabapentin 100 MG capsule Commonly known as: NEURONTIN Take 200 mg by mouth 2 (two) times daily.   levothyroxine 100 MCG tablet Commonly known as: SYNTHROID Take 100 mcg by mouth daily.   losartan 50 MG tablet Commonly known as: COZAAR Take 50 mg by mouth 2 (two) times daily.   metoprolol succinate 12.5 mg Tb24 24 hr tablet Commonly known as: TOPROL-XL Take 12.5 mg by mouth daily.   mirtazapine 7.5 MG tablet Commonly known as: REMERON Take 7.5 mg by mouth at bedtime.   OLANZapine 5 MG tablet Commonly known as: ZYPREXA Take 5 mg by mouth daily.   ondansetron 8 MG tablet Commonly known as: ZOFRAN Take 8 mg by mouth as needed for nausea or vomiting.   polyethylene glycol 17 g packet Commonly known as: MiraLax Take 17 g by mouth daily.   saccharomyces boulardii 250 MG  capsule Commonly known as: FLORASTOR Take 250 mg by mouth 2 (two) times daily.   senna 8.6 MG Tabs tablet Commonly known as: SENOKOT Take 1 tablet by mouth daily. May also take 2 tablets by mouth as needed for constipation   sodium chloride 1 g tablet Take 1 g by mouth 3 (three) times daily.   traMADol 50 MG tablet Commonly known as: ULTRAM Take 1-2 tablets (50-100 mg total) by mouth every 6 (six) hours as needed for moderate pain (pain score 4-6).   traZODone 50 MG tablet Commonly known as: DESYREL Take 50 mg by mouth at bedtime as needed for sleep.        Follow-up Information     Kathryne Hitch, MD. Schedule an appointment as soon as possible for a visit in 1 week(s).   Specialty: Orthopedic Surgery Contact information: [address] [place] [address] [phone]                  Time coordinating discharge: 45 min  Signed:  Joseph Art DO  [facility] 10/04/2023, 9:23 AM

## 2023-10-04 NOTE — Plan of Care (Signed)
  Problem: Education: Goal: Knowledge of General Education information will improve Description: Including pain rating scale, medication(s)/side effects and non-pharmacologic comfort measures Outcome: Adequate for Discharge   Problem: Health Behavior/Discharge Planning: Goal: Ability to manage health-related needs will improve Outcome: Adequate for Discharge   Problem: Clinical Measurements: Goal: Ability to maintain clinical measurements within normal limits will improve Outcome: Adequate for Discharge Goal: Will remain free from infection Outcome: Adequate for Discharge Goal: Diagnostic test results will improve Outcome: Adequate for Discharge Goal: Respiratory complications will improve Outcome: Adequate for Discharge Goal: Cardiovascular complication will be avoided Outcome: Adequate for Discharge   Problem: Activity: Goal: Risk for activity intolerance will decrease Outcome: Adequate for Discharge   Problem: Nutrition: Goal: Adequate nutrition will be maintained Outcome: Adequate for Discharge   Problem: Coping: Goal: Level of anxiety will decrease Outcome: Adequate for Discharge   Problem: Elimination: Goal: Will not experience complications related to bowel motility Outcome: Adequate for Discharge Goal: Will not experience complications related to urinary retention Outcome: Adequate for Discharge   Problem: Pain Management: Goal: General experience of comfort will improve Outcome: Adequate for Discharge   Problem: Safety: Goal: Ability to remain free from injury will improve Outcome: Adequate for Discharge   Problem: Skin Integrity: Goal: Risk for impaired skin integrity will decrease Outcome: Adequate for Discharge   Problem: Education: Goal: Knowledge of the prescribed therapeutic regimen will improve Outcome: Adequate for Discharge Goal: Understanding of discharge needs will improve Outcome: Adequate for Discharge Goal: Individualized Educational  Video(s) Outcome: Adequate for Discharge   Problem: Activity: Goal: Ability to avoid complications of mobility impairment will improve Outcome: Adequate for Discharge Goal: Ability to tolerate increased activity will improve Outcome: Adequate for Discharge   Problem: Clinical Measurements: Goal: Postoperative complications will be avoided or minimized Outcome: Adequate for Discharge   Problem: Pain Management: Goal: Pain level will decrease with appropriate interventions Outcome: Adequate for Discharge   Problem: Skin Integrity: Goal: Will show signs of wound healing Outcome: Adequate for Discharge

## 2023-10-04 NOTE — Assessment & Plan Note (Signed)
Hospitalized 09/26/23-10/04/23 for closed R hip fracture, s/p ORIF, ASA 81mg  bid for DVT risk reduction, f/u Ortho 1 week, prn Tylenol, Tramadol for pain.

## 2023-10-05 ENCOUNTER — Telehealth: Payer: Self-pay | Admitting: Orthopaedic Surgery

## 2023-10-05 ENCOUNTER — Other Ambulatory Visit: Payer: Self-pay | Admitting: Adult Health

## 2023-10-05 MED ORDER — TRAMADOL HCL 50 MG PO TABS: 50.0000 mg | ORAL_TABLET | Freq: Four times a day (QID) | ORAL | 0 refills | Status: DC | PRN

## 2023-10-06 ENCOUNTER — Encounter: Payer: Self-pay | Admitting: Sports Medicine

## 2023-10-06 ENCOUNTER — Non-Acute Institutional Stay (SKILLED_NURSING_FACILITY): Payer: Medicare PPO | Admitting: Sports Medicine

## 2023-10-06 DIAGNOSIS — K5909 Other constipation: Secondary | ICD-10-CM

## 2023-10-06 DIAGNOSIS — E039 Hypothyroidism, unspecified: Secondary | ICD-10-CM

## 2023-10-06 DIAGNOSIS — S72001A Fracture of unspecified part of neck of right femur, initial encounter for closed fracture: Secondary | ICD-10-CM

## 2023-10-06 DIAGNOSIS — I10 Essential (primary) hypertension: Secondary | ICD-10-CM | POA: Diagnosis not present

## 2023-10-06 NOTE — Progress Notes (Signed)
Provider:  Andree Coss Location:   [facility], building services of Service:   Skilled care Nursing facility Advance   PCP: Mahlon Gammon, MD Patient Care Team: Mahlon Gammon, MD as PCP - General (Internal Medicine) Jens Som Madolyn Frieze, MD as PCP - Cardiology (Cardiology) [facility], Roe Rutherford, PA as Physician Assistant (Cardiology)  Extended Emergency Contact Information Primary Emergency Contact: [facility] Phone: [phone] Relation: Son  Code Status:  Goals of Care: Advanced Directive information    10/04/2023   11:49 AM  Advanced Directives  Does Patient Have a Medical Advance Directive? No  Does patient want to make changes to medical advance directive? No - Patient declined  Would patient like information on creating a medical advance directive? No - Patient declined      No chief complaint on file.   HPI: Patient is a 82 y.o. female seen today for admission to Skilled care for STR  As per d/c summary   Pt seen and examined in her room  Spoke with therapy who informs that pt is able to stand and walk few steps  Pt has flat affect, talks very little , does not participate much in the conversation. States she is tired  Had her breakfast this morning  Denies chest pain, palpitations, sob, abdominal pain, nausea, vomiting, dysuria, hematuria, bloody or dark stools.   Admit date: 09/26/2023 Discharge date: 10/04/2023   Discharge Diagnosis Principal Problem:   Closed right hip fracture, initial encounter [facility]) Active Problems:   Hyponatremia   Essential (primary) hypertension   Chronic kidney disease, stage 3a (HCC)   Major depressive disorder, recurrent severe without psychotic features (HCC)   Hypothyroidism   Anxiety   Malnutrition of moderate degree  Recommendations   Cbc/bmp1 week Follow up with ortho 1 week-baby aspirin twice daily for DVT coverage  Consider palliative care consult for GOC/code status discussion  Hospital  discharge summary  Right hip displaced subcapital femoral neck fracture -Underwent right hip hemiarthroplasty 11/26 -Continue pain control, supportive care- scheduled tylenol -DVT prophylaxis with aspirin   Past Medical History:  Diagnosis Date   Adenomatous colon polyp    Adenomatous polyp of colon 12/03/2004   6mm   Anxiety    Chronic headaches    Chronic insomnia    Depression    Diverticulosis    External hemorrhoids    Hyperlipidemia    Hypertension    Hyperthyroidism    Hypothyroidism    Umbilical hernia    Past Surgical History:  Procedure Laterality Date   ABDOMINAL HYSTERECTOMY  1975   APPENDECTOMY  1975   COLONOSCOPY  03/03/2010,12/03/2004   ESOPHAGOGASTRODUODENOSCOPY  12/16/11   Dr. Stan Head   HIP ARTHROPLASTY Right 09/27/2023   Procedure: ARTHROPLASTY BIPOLAR HIP (HEMIARTHROPLASTY);  Surgeon: Kathryne Hitch, MD;  Location: [facility] OR;  Service: Orthopedics;  Laterality: Right;   OOPHORECTOMY     spinal lipoma removed  2006   TOTAL ABDOMINAL HYSTERECTOMY      reports that she quit smoking about 30 years ago. Her smoking use included cigarettes. She has never used smokeless tobacco. She reports current alcohol use of about 2.0 standard drinks of alcohol per week. She reports that she does not use drugs. Social History   Socioeconomic History   Marital status: Married    Spouse name: Not on file   Number of children: 2   Years of education: Not on file   Highest education level: Not on file  Occupational History   Occupation:  retired  Tobacco Use   Smoking status: Former    Current packs/day: 0.00    Types: Cigarettes    Quit date: 01/23/1993    Years since quitting: 30.7   Smokeless tobacco: Never  Vaping Use   Vaping status: Never Used  Substance and Sexual Activity   Alcohol use: Yes    Alcohol/week: 2.0 standard drinks of alcohol    Types: 2 Glasses of wine per week   Drug use: No   Sexual activity: Not on file  Other Topics Concern   Not on  file  Social History Narrative   Patient is married she is retired and has 2 children   About 2 alcoholic beverages a week no drugs, no current tobacco she is a former smoker      Lives at home with her husband   Right handed   Social Determinants of Health   Financial Resource Strain: Low Risk  (10/03/2020)   Received from [facility], [facility]   Overall Financial Resource Strain (CARDIA)    Difficulty of Paying Living Expenses: Not hard at all  Food Insecurity: No Food Insecurity (09/27/2023)   Hunger Vital Sign    Worried About Running Out of Food in the Last Year: Never true    Ran Out of Food in the Last Year: Never true  Transportation Needs: No Transportation Needs (09/27/2023)   PRAPARE - Administrator, Civil Service (Medical): No    Lack of Transportation (Non-Medical): No  Physical Activity: Not on file  Stress: Stress Concern Present (10/03/2020)   Received from [facility], [facility] of Occupational Health - Occupational Stress Questionnaire    Feeling of Stress : Rather much  Social Connections: Unknown (03/16/2022)   Received from [facility], [facility]   Social Network    Social Network: Not on file  Intimate Partner Violence: Not At Risk (09/27/2023)   Humiliation, Afraid, Rape, and Kick questionnaire    Fear of Current or Ex-Partner: No    Emotionally Abused: No    Physically Abused: No    Sexually Abused: No    Functional Status Survey:    Family History  Problem Relation Age of Onset   Esophageal cancer Father    Stomach cancer Father        mets from esophagus   Heart disease Mother    Irritable bowel syndrome Mother    Kidney disease Mother    Other Mother        cushings disease   Addison's disease Mother    Heart failure Mother    Stroke Mother    Hypertension Mother    Stroke Maternal Grandmother    Hypertension Maternal Grandmother    Colon cancer Neg Hx    Heart attack Neg Hx      Health Maintenance  Topic Date Due   Medicare Annual Wellness (AWV)  Never done   Pneumonia Vaccine 41+ Years old (1 of 1 - PCV) Never done   DEXA SCAN  Never done   INFLUENZA VACCINE  06/02/2023   COVID-19 Vaccine (5 - 2023-24 season) 07/03/2023   DTaP/Tdap/Td (2 - Td or Tdap) 02/16/2032   Zoster Vaccines- Shingrix  Completed   HPV VACCINES  Aged Out   Hepatitis C Screening  Discontinued    Allergies  Allergen Reactions   Hydrocodone-Acetaminophen Rash    Vicodin=REACTION: rash  Not listed on the MAR   Iodinated Contrast Media Hives and Rash  Per patient she has developed hives and itching in upper trunk of body after last 2 CT scans once she got homes that she has just treated at home with benadryl. She has not told anyone until now. States she was able to bear it  Other reaction(s): Rash Per patient she has developed hives and itching in upper trunk of body after last 2 CT scans once she got homes that she has just treated at home with benadryl. She has not told anyone until now. States she was able to bear it   Not listed on the [facility]    Sulfa Antibiotics Rash    REACTION: rash Other reaction(s): Unknown   Codeine     Unknown reaction  Not listed on the [facility]    Sulfonamide Derivatives     REACTION: rash   Terbinafine     Unknown reaction  Not listed on the [facility]    Benazepril Cough    Not listed on the [facility]     Outpatient Encounter Medications as of 10/06/2023  Medication Sig   acetaminophen (TYLENOL) 325 MG tablet Take 650 mg by mouth every 4 (four) hours as needed (for pain).   ALPRAZolam (XANAX) 0.5 MG tablet Take 1 tablet (0.5 mg total) by mouth 3 (three) times daily as needed.   amLODipine (NORVASC) 2.5 MG tablet Take 2.5 mg by mouth daily.   aspirin 81 MG chewable tablet Chew 1 tablet (81 mg total) by mouth 2 (two) times daily.   busPIRone (BUSPAR) 15 MG tablet Take 15 mg by mouth 2 (two) times daily.   Docusate Sodium 100 MG capsule Take 100 mg by mouth  as needed for constipation.   DULoxetine (CYMBALTA) 60 MG capsule Take 60 mg by mouth daily.   estradiol (ESTRACE) 0.1 MG/GM vaginal cream Place 1 Applicatorful vaginally 2 (two) times a week. Tuesday and Friday   feeding supplement (ENSURE ENLIVE / ENSURE PLUS) LIQD Take 237 mLs by mouth 2 (two) times daily between meals.   gabapentin (NEURONTIN) 100 MG capsule Take 200 mg by mouth 2 (two) times daily.   levothyroxine (SYNTHROID) 100 MCG tablet Take 100 mcg by mouth daily.   losartan (COZAAR) 50 MG tablet Take 50 mg by mouth 2 (two) times daily.   metoprolol succinate (TOPROL-XL) 12.5 mg TB24 24 hr tablet Take 12.5 mg by mouth daily.   mirtazapine (REMERON) 7.5 MG tablet Take 7.5 mg by mouth at bedtime.   OLANZapine (ZYPREXA) 5 MG tablet Take 5 mg by mouth daily.   ondansetron (ZOFRAN) 8 MG tablet Take 8 mg by mouth as needed for nausea or vomiting.   polyethylene glycol (MIRALAX) 17 g packet Take 17 g by mouth daily.   saccharomyces boulardii (FLORASTOR) 250 MG capsule Take 250 mg by mouth 2 (two) times daily.   senna (SENOKOT) 8.6 MG TABS tablet Take 1 tablet by mouth daily. May also take 2 tablets by mouth as needed for constipation   sodium chloride 1 g tablet Take 1 g by mouth 3 (three) times daily.   traMADol (ULTRAM) 50 MG tablet Take 1-2 tablets (50-100 mg total) by mouth every 6 (six) hours as needed for moderate pain (pain score 4-6).   traZODone (DESYREL) 50 MG tablet Take 50 mg by mouth at bedtime as needed for sleep.   No facility-administered encounter medications on file as of 10/06/2023.    Review of Systems  Constitutional:  Negative for chills and fever.  Respiratory:  Negative for cough, shortness of breath  and wheezing.   Cardiovascular:  Negative for chest pain, palpitations and leg swelling.  Gastrointestinal:  Negative for abdominal distention, abdominal pain, blood in stool, constipation, diarrhea, nausea and vomiting.  Genitourinary:  Negative for dysuria, frequency  and urgency.  Musculoskeletal:           Neurological:  Negative for dizziness, weakness and numbness.  Psychiatric/Behavioral:  Negative for confusion.     There were no vitals filed for this visit. There is no height or weight on file to calculate BMI. Physical Exam Constitutional:      Appearance: Normal appearance.  HENT:     Head: Normocephalic and atraumatic.  Cardiovascular:     Rate and Rhythm: Normal rate and regular rhythm.  Pulmonary:     Effort: Pulmonary effort is normal. No respiratory distress.     Breath sounds: Normal breath sounds. No wheezing.  Abdominal:     General: Bowel sounds are normal. There is no distension.     Tenderness: There is no abdominal tenderness. There is no guarding or rebound.     Comments:    Musculoskeletal:        General: No swelling or tenderness.     Comments: Dressing in place No Rt lower extremity swelling  No oedema noted on Rt leg    Skin:    General: Skin is dry.  Neurological:     Mental Status: She is alert. Mental status is at baseline.     Sensory: No sensory deficit.     Motor: No weakness.     Labs reviewed: Basic Metabolic Panel: Recent Labs    06/13/23 0403 06/14/23 0357 06/15/23 0340 08/07/23 1245 09/10/23 1823 09/23/23 0000 09/29/23 0524 09/30/23 0614 10/01/23 0340  NA 128* 131* 133*   < > 130*   < > 132* 134* 135  K 4.0 4.0 3.8   < > 4.3   < > 3.9 3.6 3.7  CL 99 100 102   < > 96*   < > 104 101 104  CO2 21* 25 25   < > 25   < > 25 23 25   GLUCOSE 92 92 95   < > 91   < > 89 89 108*  BUN 14 12 11    < > 10   < > 18 16 12   CREATININE 1.36* 0.93 0.73   < > 1.02*   < > 1.00 0.79 0.78  CALCIUM 9.2 8.8* 8.7*   < > 10.3   < > 8.7* 9.0 9.0  MG 2.3 2.0 2.0  --  1.9  --   --   --   --   PHOS 3.9 3.3 3.6  --   --   --   --   --   --    < > = values in this interval not displayed.   Liver Function Tests: Recent Labs    08/07/23 1245 09/08/23 1532 09/23/23 0000 09/26/23 2043  AST 21 21 19 29   ALT 15 18  14 23   ALKPHOS 60 76 76 82  BILITOT 1.0 0.7  --  0.7  PROT 6.6 7.3  --  6.6  ALBUMIN 4.0 4.3 3.7 3.6   Recent Labs    08/07/23 1245  LIPASE 36   No results for input(s): "AMMONIA" in the last 8760 hours. CBC: Recent Labs    09/08/23 1532 09/10/23 1823 09/23/23 0000 09/26/23 2043 09/27/23 0607 09/29/23 0524 09/30/23 0614 10/01/23 0340  WBC 5.5   < > 5.3  5.2   < > 9.9 8.5 8.4  NEUTROABS 3.8  --  3,058.00 3.5  --   --   --   --   HGB 11.5*   < > 11.1* 11.8*   < > 9.3* 10.2* 10.8*  HCT 34.4*   < > 33* 36.3   < > 27.6* 30.0* 32.6*  MCV 94.2   < >  --  96.0   < > 92.3 91.7 90.8  PLT 239   < > 223 216   < > 172 192 227   < > = values in this interval not displayed.   Cardiac Enzymes: No results for input(s): "CKTOTAL", "CKMB", "CKMBINDEX", "TROPONINI" in the last 8760 hours. BNP: Invalid input(s): "POCBNP" No results found for: "HGBA1C" Lab Results  Component Value Date   TSH 0.77 09/23/2023   Lab Results  Component Value Date   VITAMINB12 1,097 (H) 06/13/2023   Lab Results  Component Value Date   FOLATE 12.4 06/13/2023   Lab Results  Component Value Date   IRON 91 06/13/2023   TIBC 267 06/13/2023   FERRITIN 138 06/13/2023    Imaging and Procedures obtained prior to SNF admission: US RENAL  Result Date: 09/27/2023 CLINICAL DATA:  Acute kidney injury EXAM: RENAL / URINARY TRACT ULTRASOUND COMPLETE COMPARISON:  CT 08/07/2023 FINDINGS: Right Kidney: Renal measurements: 9.4 x 4.8 x 4.6 cm = volume: 108.5 mL. Echogenicity within normal limits. Mild right hydronephrosis. Left Kidney: Renal measurements: 9.4 x 5.2 x 4 cm = volume: 102.1 mL. Echogenicity within normal limits. No mass or hydronephrosis. Bladder: Catheter in the bladder.  Moderate to marked bladder distension Other: None. IMPRESSION: 1. Mild right hydronephrosis. 2. Moderate to marked bladder distension with catheter in place. Correlate for catheter function Electronically Signed   By: Jasmine Pang M.D.    On: 09/27/2023 23:33   DG Pelvis Portable  Result Date: 09/27/2023 CLINICAL DATA:  Postop. EXAM: PORTABLE PELVIS 1-2 VIEWS COMPARISON:  Preoperative imaging FINDINGS: Right hip hemiarthroplasty in expected alignment. No periprosthetic lucency or fracture. Recent postsurgical change includes air and edema in the soft tissues. Lateral skin staples in place. IMPRESSION: Right hip hemiarthroplasty without immediate postoperative complication. Electronically Signed   By: Narda Rutherford M.D.   On: 09/27/2023 17:36   DG HIP UNILAT WITH PELVIS 1V RIGHT  Result Date: 09/27/2023 CLINICAL DATA:  [id number] Surgery, elective [id number] EXAM: DG HIP (WITH OR WITHOUT PELVIS) 1V RIGHT COMPARISON:  Pre-Operative imaging FINDINGS: Four fluoroscopic spot views of the pelvis and right hip obtained in the operating room. Images during hip arthroplasty. Fluoroscopy time 14 seconds. Dose 0.89 mGy. IMPRESSION: Intraoperative fluoroscopy during right hip arthroplasty. Electronically Signed   By: Narda Rutherford M.D.   On: 09/27/2023 17:36   DG C-Arm 1-60 Min-No Report  Result Date: 09/27/2023 Fluoroscopy was utilized by the requesting physician.  No radiographic interpretation.   DG FEMUR PORT, MIN 2 VIEWS RIGHT  Result Date: 09/27/2023 CLINICAL DATA:  Right hip fracture EXAM: RIGHT FEMUR PORTABLE 2 VIEW COMPARISON:  None Available. FINDINGS: Two view radiograph of the right femur again demonstrates an impacted subcapital right femoral neck fracture. No dislocation. Right hip joint space preserved. The distal fracture fragment consisting of the femoral shaft is intact. Limited images of the right knee are unremarkable. IMPRESSION: 1. Impacted subcapital right femoral neck fracture. Distal femur appears intact. Electronically Signed   By: Helyn Numbers M.D.   On: 09/27/2023 00:13   DG CHEST PORT 1 VIEW  Result Date:  09/27/2023 CLINICAL DATA:  Right hip fracture. Medical clearance for surgical intervention EXAM: PORTABLE  CHEST 1 VIEW COMPARISON:  None Available. FINDINGS: The heart size and mediastinal contours are within normal limits. Both lungs are clear. The visualized skeletal structures are unremarkable. IMPRESSION: No active disease. Electronically Signed   By: Helyn Numbers M.D.   On: 09/27/2023 00:12   DG Hip Unilat W or Wo Pelvis 2-3 Views Right  Result Date: 09/26/2023 CLINICAL DATA:  Fall EXAM: DG HIP (WITH OR WITHOUT PELVIS) 2-3V RIGHT COMPARISON:  None Available. FINDINGS: There is an acute, impacted subcapital right femoral neck fracture. The femoral head is still seated within the right acetabulum and right hip joint space is preserved. The pelvis and visualized left hip are intact. Sacroiliac and left hip joint spaces are preserved. Soft tissues are unremarkable. IMPRESSION: 1. Acute, impacted subcapital right femoral neck fracture. Electronically Signed   By: Helyn Numbers M.D.   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

## 2023-10-12 ENCOUNTER — Encounter: Payer: Self-pay | Admitting: Orthopaedic Surgery

## 2023-10-12 ENCOUNTER — Ambulatory Visit (INDEPENDENT_AMBULATORY_CARE_PROVIDER_SITE_OTHER): Payer: Medicare PPO | Admitting: Orthopaedic Surgery

## 2023-10-12 DIAGNOSIS — Z96649 Presence of unspecified artificial hip joint: Secondary | ICD-10-CM

## 2023-10-12 NOTE — Progress Notes (Signed)
The patient is an 82 year old female who is here today for first postoperative visit status post a right hip hemiarthroplasty to treat an acute right hip femoral neck fracture.  Her daughter is with her today.  She stays at a skilled nursing facility and has been making progress with her mobility.  Examination of her right hip shows a well-healed surgical incision.  The staples been removed and Steri-Strips applied.  There is a moderate seroma and I did aspirate a large amount of fluid from her right hip area.  This did decompress the area.  She will continue to slowly increase her activities as comfort allows with weightbearing as tolerated and no hip precautions.  Will need to see her back in about 3 months with a standing low AP pelvis and lateral of that right operative hip.  If there are issues before then they know to let us know.

## 2023-10-13 ENCOUNTER — Non-Acute Institutional Stay (INDEPENDENT_AMBULATORY_CARE_PROVIDER_SITE_OTHER): Payer: Medicare PPO | Admitting: Sports Medicine

## 2023-10-13 DIAGNOSIS — F5101 Primary insomnia: Secondary | ICD-10-CM

## 2023-10-13 DIAGNOSIS — S72001S Fracture of unspecified part of neck of right femur, sequela: Secondary | ICD-10-CM | POA: Diagnosis not present

## 2023-10-13 DIAGNOSIS — N390 Urinary tract infection, site not specified: Secondary | ICD-10-CM | POA: Diagnosis not present

## 2023-10-13 DIAGNOSIS — F339 Major depressive disorder, recurrent, unspecified: Secondary | ICD-10-CM | POA: Diagnosis not present

## 2023-10-13 DIAGNOSIS — I10 Essential (primary) hypertension: Secondary | ICD-10-CM | POA: Diagnosis not present

## 2023-10-13 NOTE — Progress Notes (Signed)
Provider: Harvie Heck MD Location:   [facility]   Place of Service:   Skilled care   PCP: Mahlon Gammon, MD Patient Care Team: Mahlon Gammon, MD as PCP - General (Internal Medicine) Jens Som Madolyn Frieze, MD as PCP - Cardiology (Cardiology) Duke, Roe Rutherford, PA as Physician Assistant (Cardiology)  Extended Emergency Contact Information Primary Emergency Contact: [facility] Phone: [phone] Relation: Son  Code Status:  Goals of Care: Advanced Directive information    10/04/2023   11:49 AM  Advanced Directives  Does Patient Have a Medical Advance Directive? No  Does patient want to make changes to medical advance directive? No - Patient declined  Would patient like information on creating a medical advance directive? No - Patient declined      No chief complaint on file.   HPI: Patient is a 82 y.o. female seen today for  acute visit for follow up  Patient is concerned about taking multiple medications She had h/o Rt hip fracture s/p  hemiarthroplasty  11/26 Followed with ortho yesterday removed staples  as per ortho note  There is a moderate seroma and I did aspirate a large amount of fluid from her right hip area. This did decompress the area.  Pt seen and examined her room, she is having her breakfast   Care plan meeting with the family, son who came to the facility and daughter is available for the meeting over the phone.  Family reports that pt has long standing history of depression  While she was staying at independent  living  her husband was managing her meds , some times not as per physician recommendations. She will fixate her symptoms to any new change / adjustment in her medications and will not take them. Family says that they have seen bottles of xanax in her room while at independent living. She was moved to assisted living due to this and she was adjusting well  but had a fall and sustained hip fracture.   Pt has h/o  recurrent UTI , as per chart review she followed with ID , urology  She was at one point on trimethoprim for ppx but was stopped due to AKI  Pt did not complaint of dysuria during the visit with me today.   Past Medical History:  Diagnosis Date   Adenomatous colon polyp    Adenomatous polyp of colon 12/03/2004   6mm   Anxiety    Chronic headaches    Chronic insomnia    Depression    Diverticulosis    External hemorrhoids    Hyperlipidemia    Hypertension    Hyperthyroidism    Hypothyroidism    Umbilical hernia    Past Surgical History:  Procedure Laterality Date   ABDOMINAL HYSTERECTOMY  1975   APPENDECTOMY  1975   COLONOSCOPY  03/03/2010,12/03/2004   ESOPHAGOGASTRODUODENOSCOPY  12/16/11   Dr. Stan Head   HIP ARTHROPLASTY Right 09/27/2023   Procedure: ARTHROPLASTY BIPOLAR HIP (HEMIARTHROPLASTY);  Surgeon: Kathryne Hitch, MD;  Location: [facility] OR;  Service: Orthopedics;  Laterality: Right;   OOPHORECTOMY     spinal lipoma removed  2006   TOTAL ABDOMINAL HYSTERECTOMY      reports that she quit smoking about 30 years ago. Her smoking use included cigarettes. She has never used smokeless tobacco. She reports current alcohol use of about 2.0 standard drinks of alcohol per week. She reports that she does not use drugs. Social History   Socioeconomic History   Marital status:  Married    Spouse name: Not on file   Number of children: 2   Years of education: Not on file   Highest education level: Not on file  Occupational History   Occupation: retired  Tobacco Use   Smoking status: Former    Current packs/day: 0.00    Types: Cigarettes    Quit date: 01/23/1993    Years since quitting: 30.7   Smokeless tobacco: Never  Vaping Use   Vaping status: Never Used  Substance and Sexual Activity   Alcohol use: Yes    Alcohol/week: 2.0 standard drinks of alcohol    Types: 2 Glasses of wine per week   Drug use: No   Sexual activity: Not on file  Other Topics Concern   Not on  file  Social History Narrative   Patient is married she is retired and has 2 children   About 2 alcoholic beverages a week no drugs, no current tobacco she is a former smoker      Lives at home with her husband   Right handed   Social Drivers of Health   Financial Resource Strain: Low Risk  (10/03/2020)   Received from [facility], [facility]   Overall Financial Resource Strain (CARDIA)    Difficulty of Paying Living Expenses: Not hard at all  Food Insecurity: No Food Insecurity (09/27/2023)   Hunger Vital Sign    Worried About Running Out of Food in the Last Year: Never true    Ran Out of Food in the Last Year: Never true  Transportation Needs: No Transportation Needs (09/27/2023)   PRAPARE - Administrator, Civil Service (Medical): No    Lack of Transportation (Non-Medical): No  Physical Activity: Not on file  Stress: Stress Concern Present (10/03/2020)   Received from [facility], [facility] - Occupational Stress Questionnaire    Feeling of Stress : Rather much  Social Connections: Unknown (03/16/2022)   Received from [facility], [facility]   Social Network    Social Network: Not on file  Intimate Partner Violence: Not At Risk (09/27/2023)   Humiliation, Afraid, Rape, and Kick questionnaire    Fear of Current or Ex-Partner: No    Emotionally Abused: No    Physically Abused: No    Sexually Abused: No    Functional Status Survey:    Family History  Problem Relation Age of Onset   Esophageal cancer Father    Stomach cancer Father        mets from esophagus   Heart disease Mother    Irritable bowel syndrome Mother    Kidney disease Mother    Other Mother        cushings disease   Addison's disease Mother    Heart failure Mother    Stroke Mother    Hypertension Mother    Stroke Maternal Grandmother    Hypertension Maternal Grandmother    Colon cancer Neg Hx    Heart attack Neg Hx     Health  Maintenance  Topic Date Due   Medicare Annual Wellness (AWV)  Never done   Pneumonia Vaccine 56+ Years old (1 of 2 - PCV) Never done   DEXA SCAN  Never done   INFLUENZA VACCINE  06/02/2023   COVID-19 Vaccine (5 - 2024-25 season) 07/03/2023   DTaP/Tdap/Td (2 - Td or Tdap) 02/16/2032   Zoster Vaccines- Shingrix  Completed   HPV VACCINES  Aged Out  Hepatitis C Screening  Discontinued    Allergies  Allergen Reactions   Hydrocodone-Acetaminophen Rash    Vicodin=REACTION: rash  Not listed on the MAR   Iodinated Contrast Media Hives and Rash    Per patient she has developed hives and itching in upper trunk of body after last 2 CT scans once she got homes that she has just treated at home with benadryl. She has not told anyone until now. States she was able to bear it  Other reaction(s): Rash Per patient she has developed hives and itching in upper trunk of body after last 2 CT scans once she got homes that she has just treated at home with benadryl. She has not told anyone until now. States she was able to bear it   Not listed on the [facility]    Sulfa Antibiotics Rash    REACTION: rash Other reaction(s): Unknown   Codeine     Unknown reaction  Not listed on the [facility]    Sulfonamide Derivatives     REACTION: rash   Terbinafine     Unknown reaction  Not listed on the [facility]    Benazepril Cough    Not listed on the [facility]     Outpatient Encounter Medications as of 10/13/2023  Medication Sig   acetaminophen (TYLENOL) 325 MG tablet Take 650 mg by mouth every 4 (four) hours as needed (for pain).   ALPRAZolam (XANAX) 0.5 MG tablet Take 1 tablet (0.5 mg total) by mouth 3 (three) times daily as needed.   amLODipine (NORVASC) 2.5 MG tablet Take 2.5 mg by mouth daily.   aspirin 81 MG chewable tablet Chew 1 tablet (81 mg total) by mouth 2 (two) times daily.   busPIRone (BUSPAR) 15 MG tablet Take 15 mg by mouth 2 (two) times daily.   Docusate Sodium 100 MG capsule Take 100 mg by mouth as needed  for constipation.   DULoxetine (CYMBALTA) 60 MG capsule Take 60 mg by mouth daily.   estradiol (ESTRACE) 0.1 MG/GM vaginal cream Place 1 Applicatorful vaginally 2 (two) times a week. Tuesday and Friday   feeding supplement (ENSURE ENLIVE / ENSURE PLUS) LIQD Take 237 mLs by mouth 2 (two) times daily between meals.   gabapentin (NEURONTIN) 100 MG capsule Take 200 mg by mouth 2 (two) times daily.   levothyroxine (SYNTHROID) 100 MCG tablet Take 100 mcg by mouth daily.   losartan (COZAAR) 50 MG tablet Take 50 mg by mouth 2 (two) times daily.   metoprolol succinate (TOPROL-XL) 12.5 mg TB24 24 hr tablet Take 12.5 mg by mouth daily.   mirtazapine (REMERON) 7.5 MG tablet Take 7.5 mg by mouth at bedtime.   OLANZapine (ZYPREXA) 5 MG tablet Take 5 mg by mouth daily.   ondansetron (ZOFRAN) 8 MG tablet Take 8 mg by mouth as needed for nausea or vomiting.   polyethylene glycol (MIRALAX) 17 g packet Take 17 g by mouth daily.   saccharomyces boulardii (FLORASTOR) 250 MG capsule Take 250 mg by mouth 2 (two) times daily.   senna (SENOKOT) 8.6 MG TABS tablet Take 1 tablet by mouth daily. May also take 2 tablets by mouth as needed for constipation   sodium chloride 1 g tablet Take 1 g by mouth 3 (three) times daily.   traMADol (ULTRAM) 50 MG tablet Take 1-2 tablets (50-100 mg total) by mouth every 6 (six) hours as needed for moderate pain (pain score 4-6).   traZODone (DESYREL) 50 MG tablet Take 50 mg by mouth at bedtime  as needed for sleep.   No facility-administered encounter medications on file as of 10/13/2023.    Review of Systems  Constitutional:  Negative for fever.  HENT:  Negative for sinus pressure and sore throat.   Respiratory:  Negative for cough, shortness of breath and wheezing.   Cardiovascular:  Negative for chest pain, palpitations and leg swelling.  Gastrointestinal:  Negative for abdominal distention, abdominal pain, blood in stool, constipation, diarrhea, nausea and vomiting.   Genitourinary:  Negative for dysuria, frequency and urgency.  Neurological:  Negative for dizziness, weakness and numbness.  Psychiatric/Behavioral:  Positive for dysphoric mood and sleep disturbance. The patient is nervous/anxious.     There were no vitals filed for this visit. There is no height or weight on file to calculate BMI. Physical Exam Constitutional:      Appearance: Normal appearance.  HENT:     Head: Atraumatic.  Cardiovascular:     Rate and Rhythm: Normal rate and regular rhythm.  Pulmonary:     Effort: Pulmonary effort is normal. No respiratory distress.     Breath sounds: Normal breath sounds. No wheezing.  Abdominal:     General: Bowel sounds are normal. There is no distension.     Tenderness: There is no abdominal tenderness. There is no guarding or rebound.     Comments:    Musculoskeletal:        General: No swelling.  Neurological:     Mental Status: She is alert. Mental status is at baseline.     Labs reviewed: Basic Metabolic Panel: Recent Labs    06/13/23 0403 06/14/23 0357 06/15/23 0340 08/07/23 1245 09/10/23 1823 09/23/23 0000 09/29/23 0524 09/30/23 0614 10/01/23 0340  NA 128* 131* 133*   < > 130*   < > 132* 134* 135  K 4.0 4.0 3.8   < > 4.3   < > 3.9 3.6 3.7  CL 99 100 102   < > 96*   < > 104 101 104  CO2 21* 25 25   < > 25   < > 25 23 25   GLUCOSE 92 92 95   < > 91   < > 89 89 108*  BUN 14 12 11    < > 10   < > 18 16 12   CREATININE 1.36* 0.93 0.73   < > 1.02*   < > 1.00 0.79 0.78  CALCIUM 9.2 8.8* 8.7*   < > 10.3   < > 8.7* 9.0 9.0  MG 2.3 2.0 2.0  --  1.9  --   --   --   --   PHOS 3.9 3.3 3.6  --   --   --   --   --   --    < > = values in this interval not displayed.   Liver Function Tests: Recent Labs    08/07/23 1245 09/08/23 1532 09/23/23 0000 09/26/23 2043  AST 21 21 19 29   ALT 15 18 14 23   ALKPHOS 60 76 76 82  BILITOT 1.0 0.7  --  0.7  PROT 6.6 7.3  --  6.6  ALBUMIN 4.0 4.3 3.7 3.6   Recent Labs    08/07/23 1245   LIPASE 36   No results for input(s): "AMMONIA" in the last 8760 hours. CBC: Recent Labs    09/08/23 1532 09/10/23 1823 09/23/23 0000 09/26/23 2043 09/27/23 0607 09/29/23 0524 09/30/23 0614 10/01/23 0340  WBC 5.5   < > 5.3 5.2   < > 9.9  8.5 8.4  NEUTROABS 3.8  --  3,058.00 3.5  --   --   --   --   HGB 11.5*   < > 11.1* 11.8*   < > 9.3* 10.2* 10.8*  HCT 34.4*   < > 33* 36.3   < > 27.6* 30.0* 32.6*  MCV 94.2   < >  --  96.0   < > 92.3 91.7 90.8  PLT 239   < > 223 216   < > 172 192 227   < > = values in this interval not displayed.   Cardiac Enzymes: No results for input(s): "CKTOTAL", "CKMB", "CKMBINDEX", "TROPONINI" in the last 8760 hours. BNP: Invalid input(s): "POCBNP" No results found for: "HGBA1C" Lab Results  Component Value Date   TSH 0.77 09/23/2023   Lab Results  Component Value Date   VITAMINB12 1,097 (H) 06/13/2023   Lab Results  Component Value Date   FOLATE 12.4 06/13/2023   Lab Results  Component Value Date   IRON 91 06/13/2023   TIBC 267 06/13/2023   FERRITIN 138 06/13/2023    Imaging and Procedures obtained prior to SNF admission: US RENAL Result Date: 09/27/2023 CLINICAL DATA:  Acute kidney injury EXAM: RENAL / URINARY TRACT ULTRASOUND COMPLETE COMPARISON:  CT 08/07/2023 FINDINGS: Right Kidney: Renal measurements: 9.4 x 4.8 x 4.6 cm = volume: 108.5 mL. Echogenicity within normal limits. Mild right hydronephrosis. Left Kidney: Renal measurements: 9.4 x 5.2 x 4 cm = volume: 102.1 mL. Echogenicity within normal limits. No mass or hydronephrosis. Bladder: Catheter in the bladder.  Moderate to marked bladder distension Other: None. IMPRESSION: 1. Mild right hydronephrosis. 2. Moderate to marked bladder distension with catheter in place. Correlate for catheter function Electronically Signed   By: Jasmine Pang M.D.   On: 09/27/2023 23:33   DG Pelvis Portable Result Date: 09/27/2023 CLINICAL DATA:  Postop. EXAM: PORTABLE PELVIS 1-2 VIEWS COMPARISON:   Preoperative imaging FINDINGS: Right hip hemiarthroplasty in expected alignment. No periprosthetic lucency or fracture. Recent postsurgical change includes air and edema in the soft tissues. Lateral skin staples in place. IMPRESSION: Right hip hemiarthroplasty without immediate postoperative complication. Electronically Signed   By: Narda Rutherford M.D.   On: 09/27/2023 17:36   DG HIP UNILAT WITH PELVIS 1V RIGHT Result Date: 09/27/2023 CLINICAL DATA:  [id number] Surgery, elective [id number] EXAM: DG HIP (WITH OR WITHOUT PELVIS) 1V RIGHT COMPARISON:  Pre-Operative imaging FINDINGS: Four fluoroscopic spot views of the pelvis and right hip obtained in the operating room. Images during hip arthroplasty. Fluoroscopy time 14 seconds. Dose 0.89 mGy. IMPRESSION: Intraoperative fluoroscopy during right hip arthroplasty. Electronically Signed   By: Narda Rutherford M.D.   On: 09/27/2023 17:36   DG C-Arm 1-60 Min-No Report Result Date: 09/27/2023 Fluoroscopy was utilized by the requesting physician.  No radiographic interpretation.   DG FEMUR PORT, MIN 2 VIEWS RIGHT Result Date: 09/27/2023 CLINICAL DATA:  Right hip fracture EXAM: RIGHT FEMUR PORTABLE 2 VIEW COMPARISON:  None Available. FINDINGS: Two view radiograph of the right femur again demonstrates an impacted subcapital right femoral neck fracture. No dislocation. Right hip joint space preserved. The distal fracture fragment consisting of the femoral shaft is intact. Limited images of the right knee are unremarkable. IMPRESSION: 1. Impacted subcapital right femoral neck fracture. Distal femur appears intact. Electronically Signed   By: Helyn Numbers M.D.   On: 09/27/2023 00:13   DG CHEST PORT 1 VIEW Result Date: 09/27/2023 CLINICAL DATA:  Right hip fracture. Medical clearance for surgical intervention  EXAM: PORTABLE CHEST 1 VIEW COMPARISON:  None Available. FINDINGS: The heart size and mediastinal contours are within normal limits. Both lungs are clear. The  visualized skeletal structures are unremarkable. IMPRESSION: No active disease. Electronically Signed   By: Helyn Numbers M.D.   On: 09/27/2023 00:12   DG Hip Unilat W or Wo Pelvis 2-3 Views Right Result Date: 09/26/2023 CLINICAL DATA:  Fall EXAM: DG HIP (WITH OR WITHOUT PELVIS) 2-3V RIGHT COMPARISON:  None Available. FINDINGS: There is an acute, impacted subcapital right femoral neck fracture. The femoral head is still seated within the right acetabulum and right hip joint space is preserved. The pelvis and visualized left hip are intact. Sacroiliac and left hip joint spaces are preserved. Soft tissues are unremarkable. IMPRESSION: 1. Acute, impacted subcapital right femoral neck fracture. Electronically Signed   By: Helyn Numbers M.D.   On: 09/26/2023 22:02    Assessment/Plan Resistant Depression  Will increase cymbalta to 90 mg from 60 mg  Will increase rameron to 15 mg  Cont with olanzapine, buspar Will cont with xanax prn, hopefully we can find a regimen that helps with depression  Will consider stopping xanax in the future   Insomnia  Will increase rameron to 15 mg  Cont with trazodone   Recurrent UTI  Will start hipprex 1 gm bid  Will need records from [facility]  HTN  Will stop amlodipine Increase toprol to 25mg  from 12.5 mg   Rt hip fracture Followed with ortho yesterday  Cont with  tylenol prn  Cont with PT/OT  Family/ staff Communication:  care plan discussed with the nursing staff  60 minTotal time spent for obtaining history,  performing a medically appropriate examination and evaluation, reviewing the tests,ordering  tests,  documenting clinical information in the electronic or other health record, independently interpreting results ,care coordination (not separately reported)

## 2023-10-14 ENCOUNTER — Encounter: Payer: Self-pay | Admitting: Sports Medicine

## 2023-10-24 DIAGNOSIS — M6281 Muscle weakness (generalized): Secondary | ICD-10-CM | POA: Diagnosis not present

## 2023-10-24 DIAGNOSIS — R29898 Other symptoms and signs involving the musculoskeletal system: Secondary | ICD-10-CM | POA: Diagnosis not present

## 2023-10-24 DIAGNOSIS — R2689 Other abnormalities of gait and mobility: Secondary | ICD-10-CM | POA: Diagnosis not present

## 2023-10-24 DIAGNOSIS — R41841 Cognitive communication deficit: Secondary | ICD-10-CM | POA: Diagnosis not present

## 2023-10-24 DIAGNOSIS — R278 Other lack of coordination: Secondary | ICD-10-CM | POA: Diagnosis not present

## 2023-10-24 DIAGNOSIS — R2681 Unsteadiness on feet: Secondary | ICD-10-CM | POA: Diagnosis not present

## 2023-10-25 ENCOUNTER — Telehealth: Payer: Self-pay | Admitting: Orthopedic Surgery

## 2023-10-25 DIAGNOSIS — R2689 Other abnormalities of gait and mobility: Secondary | ICD-10-CM | POA: Diagnosis not present

## 2023-10-25 DIAGNOSIS — M160 Bilateral primary osteoarthritis of hip: Secondary | ICD-10-CM | POA: Diagnosis not present

## 2023-10-25 DIAGNOSIS — R29898 Other symptoms and signs involving the musculoskeletal system: Secondary | ICD-10-CM | POA: Diagnosis not present

## 2023-10-25 DIAGNOSIS — Z96641 Presence of right artificial hip joint: Secondary | ICD-10-CM | POA: Diagnosis not present

## 2023-10-25 DIAGNOSIS — R2681 Unsteadiness on feet: Secondary | ICD-10-CM | POA: Diagnosis not present

## 2023-10-25 DIAGNOSIS — M85852 Other specified disorders of bone density and structure, left thigh: Secondary | ICD-10-CM | POA: Diagnosis not present

## 2023-10-25 DIAGNOSIS — R41841 Cognitive communication deficit: Secondary | ICD-10-CM | POA: Diagnosis not present

## 2023-10-25 DIAGNOSIS — M6281 Muscle weakness (generalized): Secondary | ICD-10-CM | POA: Diagnosis not present

## 2023-10-25 DIAGNOSIS — R278 Other lack of coordination: Secondary | ICD-10-CM | POA: Diagnosis not present

## 2023-10-25 DIAGNOSIS — M85851 Other specified disorders of bone density and structure, right thigh: Secondary | ICD-10-CM | POA: Diagnosis not present

## 2023-10-25 NOTE — Telephone Encounter (Signed)
[facility] calls to report fall with increased right leg pain. S/p right hip hemiarthroplasty 09/27/2023. She was able to get herself back into bed. Unable to rate pain, just states right leg "hurts." No internal/external rotation per nursing. Orders for stat xray of right hip and pelvis given and tylenol 1000 mg po once. If unable to bear weight or pain continues, recommend ED evaluation. Nursing to discuss treatment options with family.

## 2023-10-27 DIAGNOSIS — E440 Moderate protein-calorie malnutrition: Secondary | ICD-10-CM | POA: Diagnosis not present

## 2023-10-27 DIAGNOSIS — R278 Other lack of coordination: Secondary | ICD-10-CM | POA: Diagnosis not present

## 2023-10-27 DIAGNOSIS — M6281 Muscle weakness (generalized): Secondary | ICD-10-CM | POA: Diagnosis not present

## 2023-10-27 DIAGNOSIS — R29898 Other symptoms and signs involving the musculoskeletal system: Secondary | ICD-10-CM | POA: Diagnosis not present

## 2023-10-27 DIAGNOSIS — I10 Essential (primary) hypertension: Secondary | ICD-10-CM | POA: Diagnosis not present

## 2023-10-27 DIAGNOSIS — R2689 Other abnormalities of gait and mobility: Secondary | ICD-10-CM | POA: Diagnosis not present

## 2023-10-27 DIAGNOSIS — R41841 Cognitive communication deficit: Secondary | ICD-10-CM | POA: Diagnosis not present

## 2023-10-27 DIAGNOSIS — R2681 Unsteadiness on feet: Secondary | ICD-10-CM | POA: Diagnosis not present

## 2023-10-27 LAB — IRON,TIBC AND FERRITIN PANEL
%SAT: 20
Ferritin: 110
Iron: 52
TIBC: 258

## 2023-10-27 LAB — VITAMIN B12: Vitamin B-12: 323

## 2023-10-28 DIAGNOSIS — R29898 Other symptoms and signs involving the musculoskeletal system: Secondary | ICD-10-CM | POA: Diagnosis not present

## 2023-10-28 DIAGNOSIS — R278 Other lack of coordination: Secondary | ICD-10-CM | POA: Diagnosis not present

## 2023-10-28 DIAGNOSIS — M6281 Muscle weakness (generalized): Secondary | ICD-10-CM | POA: Diagnosis not present

## 2023-10-28 DIAGNOSIS — R41841 Cognitive communication deficit: Secondary | ICD-10-CM | POA: Diagnosis not present

## 2023-10-28 DIAGNOSIS — R2681 Unsteadiness on feet: Secondary | ICD-10-CM | POA: Diagnosis not present

## 2023-10-28 DIAGNOSIS — R2689 Other abnormalities of gait and mobility: Secondary | ICD-10-CM | POA: Diagnosis not present

## 2023-10-29 DIAGNOSIS — R41841 Cognitive communication deficit: Secondary | ICD-10-CM | POA: Diagnosis not present

## 2023-10-29 DIAGNOSIS — R2681 Unsteadiness on feet: Secondary | ICD-10-CM | POA: Diagnosis not present

## 2023-10-29 DIAGNOSIS — M6281 Muscle weakness (generalized): Secondary | ICD-10-CM | POA: Diagnosis not present

## 2023-10-29 DIAGNOSIS — R278 Other lack of coordination: Secondary | ICD-10-CM | POA: Diagnosis not present

## 2023-10-29 DIAGNOSIS — R29898 Other symptoms and signs involving the musculoskeletal system: Secondary | ICD-10-CM | POA: Diagnosis not present

## 2023-10-29 DIAGNOSIS — R2689 Other abnormalities of gait and mobility: Secondary | ICD-10-CM | POA: Diagnosis not present

## 2023-10-31 DIAGNOSIS — R41841 Cognitive communication deficit: Secondary | ICD-10-CM | POA: Diagnosis not present

## 2023-10-31 DIAGNOSIS — R29898 Other symptoms and signs involving the musculoskeletal system: Secondary | ICD-10-CM | POA: Diagnosis not present

## 2023-10-31 DIAGNOSIS — R278 Other lack of coordination: Secondary | ICD-10-CM | POA: Diagnosis not present

## 2023-10-31 DIAGNOSIS — R2689 Other abnormalities of gait and mobility: Secondary | ICD-10-CM | POA: Diagnosis not present

## 2023-10-31 DIAGNOSIS — M6281 Muscle weakness (generalized): Secondary | ICD-10-CM | POA: Diagnosis not present

## 2023-10-31 DIAGNOSIS — R2681 Unsteadiness on feet: Secondary | ICD-10-CM | POA: Diagnosis not present

## 2023-11-01 DIAGNOSIS — R278 Other lack of coordination: Secondary | ICD-10-CM | POA: Diagnosis not present

## 2023-11-01 DIAGNOSIS — R29898 Other symptoms and signs involving the musculoskeletal system: Secondary | ICD-10-CM | POA: Diagnosis not present

## 2023-11-01 DIAGNOSIS — R41841 Cognitive communication deficit: Secondary | ICD-10-CM | POA: Diagnosis not present

## 2023-11-01 DIAGNOSIS — R2689 Other abnormalities of gait and mobility: Secondary | ICD-10-CM | POA: Diagnosis not present

## 2023-11-01 DIAGNOSIS — R2681 Unsteadiness on feet: Secondary | ICD-10-CM | POA: Diagnosis not present

## 2023-11-01 DIAGNOSIS — M6281 Muscle weakness (generalized): Secondary | ICD-10-CM | POA: Diagnosis not present

## 2023-11-02 DIAGNOSIS — M6281 Muscle weakness (generalized): Secondary | ICD-10-CM | POA: Diagnosis not present

## 2023-11-02 DIAGNOSIS — R278 Other lack of coordination: Secondary | ICD-10-CM | POA: Diagnosis not present

## 2023-11-02 DIAGNOSIS — R29898 Other symptoms and signs involving the musculoskeletal system: Secondary | ICD-10-CM | POA: Diagnosis not present

## 2023-11-02 DIAGNOSIS — R2689 Other abnormalities of gait and mobility: Secondary | ICD-10-CM | POA: Diagnosis not present

## 2023-11-02 DIAGNOSIS — R41841 Cognitive communication deficit: Secondary | ICD-10-CM | POA: Diagnosis not present

## 2023-11-02 DIAGNOSIS — R2681 Unsteadiness on feet: Secondary | ICD-10-CM | POA: Diagnosis not present

## 2023-11-03 DIAGNOSIS — M6281 Muscle weakness (generalized): Secondary | ICD-10-CM | POA: Diagnosis not present

## 2023-11-03 DIAGNOSIS — R41841 Cognitive communication deficit: Secondary | ICD-10-CM | POA: Diagnosis not present

## 2023-11-03 DIAGNOSIS — R2681 Unsteadiness on feet: Secondary | ICD-10-CM | POA: Diagnosis not present

## 2023-11-03 DIAGNOSIS — R278 Other lack of coordination: Secondary | ICD-10-CM | POA: Diagnosis not present

## 2023-11-03 DIAGNOSIS — R2689 Other abnormalities of gait and mobility: Secondary | ICD-10-CM | POA: Diagnosis not present

## 2023-11-03 DIAGNOSIS — R29898 Other symptoms and signs involving the musculoskeletal system: Secondary | ICD-10-CM | POA: Diagnosis not present

## 2023-11-04 DIAGNOSIS — R2689 Other abnormalities of gait and mobility: Secondary | ICD-10-CM | POA: Diagnosis not present

## 2023-11-04 DIAGNOSIS — R41841 Cognitive communication deficit: Secondary | ICD-10-CM | POA: Diagnosis not present

## 2023-11-04 DIAGNOSIS — R278 Other lack of coordination: Secondary | ICD-10-CM | POA: Diagnosis not present

## 2023-11-04 DIAGNOSIS — M6281 Muscle weakness (generalized): Secondary | ICD-10-CM | POA: Diagnosis not present

## 2023-11-04 DIAGNOSIS — R29898 Other symptoms and signs involving the musculoskeletal system: Secondary | ICD-10-CM | POA: Diagnosis not present

## 2023-11-04 DIAGNOSIS — R2681 Unsteadiness on feet: Secondary | ICD-10-CM | POA: Diagnosis not present

## 2023-11-05 DIAGNOSIS — M25551 Pain in right hip: Secondary | ICD-10-CM | POA: Diagnosis not present

## 2023-11-05 DIAGNOSIS — Z96641 Presence of right artificial hip joint: Secondary | ICD-10-CM | POA: Diagnosis not present

## 2023-11-06 DIAGNOSIS — R278 Other lack of coordination: Secondary | ICD-10-CM | POA: Diagnosis not present

## 2023-11-06 DIAGNOSIS — R41841 Cognitive communication deficit: Secondary | ICD-10-CM | POA: Diagnosis not present

## 2023-11-06 DIAGNOSIS — R2681 Unsteadiness on feet: Secondary | ICD-10-CM | POA: Diagnosis not present

## 2023-11-06 DIAGNOSIS — R29898 Other symptoms and signs involving the musculoskeletal system: Secondary | ICD-10-CM | POA: Diagnosis not present

## 2023-11-06 DIAGNOSIS — R2689 Other abnormalities of gait and mobility: Secondary | ICD-10-CM | POA: Diagnosis not present

## 2023-11-06 DIAGNOSIS — M6281 Muscle weakness (generalized): Secondary | ICD-10-CM | POA: Diagnosis not present

## 2023-11-07 ENCOUNTER — Encounter: Payer: Self-pay | Admitting: Nurse Practitioner

## 2023-11-07 ENCOUNTER — Non-Acute Institutional Stay (SKILLED_NURSING_FACILITY): Payer: Medicare PPO | Admitting: Nurse Practitioner

## 2023-11-07 DIAGNOSIS — E871 Hypo-osmolality and hyponatremia: Secondary | ICD-10-CM | POA: Diagnosis not present

## 2023-11-07 DIAGNOSIS — G609 Hereditary and idiopathic neuropathy, unspecified: Secondary | ICD-10-CM | POA: Diagnosis not present

## 2023-11-07 DIAGNOSIS — R2681 Unsteadiness on feet: Secondary | ICD-10-CM | POA: Diagnosis not present

## 2023-11-07 DIAGNOSIS — R2689 Other abnormalities of gait and mobility: Secondary | ICD-10-CM | POA: Diagnosis not present

## 2023-11-07 DIAGNOSIS — M25551 Pain in right hip: Secondary | ICD-10-CM | POA: Diagnosis not present

## 2023-11-07 DIAGNOSIS — K5909 Other constipation: Secondary | ICD-10-CM

## 2023-11-07 DIAGNOSIS — R29898 Other symptoms and signs involving the musculoskeletal system: Secondary | ICD-10-CM | POA: Diagnosis not present

## 2023-11-07 DIAGNOSIS — R41841 Cognitive communication deficit: Secondary | ICD-10-CM | POA: Diagnosis not present

## 2023-11-07 DIAGNOSIS — E039 Hypothyroidism, unspecified: Secondary | ICD-10-CM

## 2023-11-07 DIAGNOSIS — N1831 Chronic kidney disease, stage 3a: Secondary | ICD-10-CM | POA: Diagnosis not present

## 2023-11-07 DIAGNOSIS — F332 Major depressive disorder, recurrent severe without psychotic features: Secondary | ICD-10-CM

## 2023-11-07 DIAGNOSIS — I152 Hypertension secondary to endocrine disorders: Secondary | ICD-10-CM

## 2023-11-07 DIAGNOSIS — M6281 Muscle weakness (generalized): Secondary | ICD-10-CM | POA: Diagnosis not present

## 2023-11-07 DIAGNOSIS — R278 Other lack of coordination: Secondary | ICD-10-CM | POA: Diagnosis not present

## 2023-11-07 NOTE — Assessment & Plan Note (Signed)
 Stable, Alprazolam, Trazodone, Buspirone, Duloxetine, Mirtazapine, Olanzapine

## 2023-11-07 NOTE — Assessment & Plan Note (Signed)
 Bun/creat 12/0.78 10/01/23

## 2023-11-07 NOTE — Assessment & Plan Note (Signed)
 Managed with on Gabapentin

## 2023-11-07 NOTE — Assessment & Plan Note (Signed)
 Clemens 11/05/22 the bathroom and got herself back up, c/o R hip pain, able to bear weight and no shortened of the R leg.    Hospitalized 09/26/23-10/04/23 for closed R hip fracture, s/p ORIF, ASA 81mg  bid for DVT risk reduction, f/u Ortho 1 week, prn Tylenol , Tramadol  for pain.

## 2023-11-07 NOTE — Assessment & Plan Note (Signed)
 Stable, on Colace prn, Senokot

## 2023-11-07 NOTE — Assessment & Plan Note (Signed)
 taking Levothyroxine, TSH 0.77 09/23/23

## 2023-11-07 NOTE — Assessment & Plan Note (Signed)
 Blood pressure is controlled,  on Amlodipine, Losartan, Metoprolol, prn Hydralazine

## 2023-11-07 NOTE — Assessment & Plan Note (Signed)
 salt tabs, Na 135 10/01/23

## 2023-11-07 NOTE — Progress Notes (Signed)
 Location:   [facility] Room Number: 92 Place of Service:  SNF (31) Provider: Larwance Nneoma Harral NP  Charlanne Fredia CROME, MD  Patient Care Team: Charlanne Fredia CROME, MD as PCP - General (Internal Medicine) Pietro Redell RAMAN, MD as PCP - Cardiology (Cardiology) Duke, Jon Garre, PA as Physician Assistant (Cardiology)  Extended Emergency Contact Information Primary Emergency Contact: [facility] Phone: [phone] Relation: Son  Code Status: DNR Goals of care: Advanced Directive information    10/04/2023   11:49 AM  Advanced Directives  Does Patient Have a Medical Advance Directive? No  Does patient want to make changes to medical advance directive? No - Patient declined  Would patient like information on creating a medical advance directive? No - Patient declined     Chief Complaint  Patient presents with  . Medical Management of Chronic Issues    HPI:  Pt is a 83 y.o. female seen today for managing chronic medical conditions.   Clemens 11/05/22 the bathroom and got herself back up, c/o R hip pain, able to bear weight and no shortened of the R leg.    Hospitalized 09/26/23-10/04/23 for closed R hip fracture, s/p ORIF, ASA 81mg  bid for DVT risk reduction, f/u Ortho 1 week, prn Tylenol , Tramadol  for pain.              Peripheral neuropathy, on Gabapentin              HTN, on Amlodipine , Losartan , Metoprolol , prn Hydralazine              CKD, Bun/creat 12/0.78 10/01/23             Depression, Alprazolam , Trazodone , Buspirone , Duloxetine , Mirtazapine , Olanzapine              Constipation, on Colace prn, Senokot              Atrophic vaginitis, Estradiol vaginal 2x/wk             Hyponatremia, salt tabs, Na 135 10/01/23             Hypothyroidism, taking Levothyroxine , TSH 0.77 09/23/23             GERD, Hgb 10.8 10/01/23  Past Medical History:  Diagnosis Date  . Adenomatous colon polyp   . Adenomatous polyp of colon 12/03/2004   6mm  . Anxiety   . Chronic headaches   .  Chronic insomnia   . Depression   . Diverticulosis   . External hemorrhoids   . Hyperlipidemia   . Hypertension   . Hyperthyroidism   . Hypothyroidism   . Umbilical hernia    Past Surgical History:  Procedure Laterality Date  . ABDOMINAL HYSTERECTOMY  1975  . APPENDECTOMY  1975  . COLONOSCOPY  03/03/2010,12/03/2004  . ESOPHAGOGASTRODUODENOSCOPY  12/16/11   Dr. Lupita Commander  . HIP ARTHROPLASTY Right 09/27/2023   Procedure: ARTHROPLASTY BIPOLAR HIP (HEMIARTHROPLASTY);  Surgeon: Vernetta Lonni GRADE, MD;  Location: [facility] OR;  Service: Orthopedics;  Laterality: Right;  . OOPHORECTOMY    . spinal lipoma removed  2006  . TOTAL ABDOMINAL HYSTERECTOMY      Allergies  Allergen Reactions  . Hydrocodone -Acetaminophen  Rash    Vicodin=REACTION: rash  Not listed on the [facility]  . Iodinated Contrast Media Hives and Rash    Per patient she has developed hives and itching in upper trunk of body after last 2 CT scans once she got homes that she has just treated at home with benadryl . She has not told  anyone until now. States she was able to bear it  Other reaction(s): Rash Per patient she has developed hives and itching in upper trunk of body after last 2 CT scans once she got homes that she has just treated at home with benadryl . She has not told anyone until now. States she was able to bear it   Not listed on the [facility]   . Sulfa Antibiotics Rash    REACTION: rash Other reaction(s): Unknown  . Codeine     Unknown reaction  Not listed on the [facility]   . Sulfonamide Derivatives     REACTION: rash  . Terbinafine     Unknown reaction  Not listed on the [facility]   . Benazepril  Cough    Not listed on the [facility]     Allergies as of 11/07/2023       Reactions   Hydrocodone -acetaminophen  Rash   Vicodin=REACTION: rash Not listed on the [facility]   Iodinated Contrast Media Hives, Rash   Per patient she has developed hives and itching in upper trunk of body after last 2 CT scans once she got homes that she has  just treated at home with benadryl . She has not told anyone until now. States she was able to bear it  Other reaction(s): Rash Per patient she has developed hives and itching in upper trunk of body after last 2 CT scans once she got homes that she has just treated at home with benadryl . She has not told anyone until now. States she was able to bear it  Not listed on the [facility]   Sulfa Antibiotics Rash   REACTION: rash Other reaction(s): Unknown   Codeine    Unknown reaction  Not listed on the [facility]   Sulfonamide Derivatives    REACTION: rash   Terbinafine    Unknown reaction Not listed on the [facility]   Benazepril  Cough   Not listed on the [facility]        Medication List        Accurate as of November 07, 2023 11:59 PM. If you have any questions, ask your nurse or doctor.          acetaminophen  325 MG tablet Commonly known as: TYLENOL  Take 650 mg by mouth every 4 (four) hours as needed (for pain).   ALPRAZolam  0.5 MG tablet Commonly known as: XANAX  Take 1 tablet (0.5 mg total) by mouth 3 (three) times daily as needed.   amLODipine  2.5 MG tablet Commonly known as: NORVASC  Take 2.5 mg by mouth daily.   aspirin  81 MG chewable tablet Chew 1 tablet (81 mg total) by mouth 2 (two) times daily.   busPIRone  15 MG tablet Commonly known as: BUSPAR  Take 15 mg by mouth 2 (two) times daily.   Docusate Sodium  100 MG capsule Take 100 mg by mouth as needed for constipation.   DULoxetine  60 MG capsule Commonly known as: CYMBALTA  Take 60 mg by mouth daily.   estradiol 0.1 MG/GM vaginal cream Commonly known as: ESTRACE Place 1 Applicatorful vaginally 2 (two) times a week. Tuesday and Friday   feeding supplement Liqd Take 237 mLs by mouth 2 (two) times daily between meals.   gabapentin  100 MG capsule Commonly known as: NEURONTIN  Take 200 mg by mouth 2 (two) times daily.   levothyroxine  100 MCG tablet Commonly known as: SYNTHROID  Take 100 mcg by mouth daily.   losartan  50 MG  tablet Commonly known as: COZAAR  Take 50 mg by mouth 2 (two) times daily.  metoprolol  succinate 12.5 mg Tb24 24 hr tablet Commonly known as: TOPROL -XL Take 12.5 mg by mouth daily.   mirtazapine  7.5 MG tablet Commonly known as: REMERON  Take 7.5 mg by mouth at bedtime.   OLANZapine  5 MG tablet Commonly known as: ZYPREXA  Take 5 mg by mouth daily.   ondansetron  8 MG tablet Commonly known as: ZOFRAN  Take 8 mg by mouth as needed for nausea or vomiting.   polyethylene glycol 17 g packet Commonly known as: MiraLax  Take 17 g by mouth daily.   saccharomyces boulardii 250 MG capsule Commonly known as: FLORASTOR Take 250 mg by mouth 2 (two) times daily.   senna 8.6 MG Tabs tablet Commonly known as: SENOKOT Take 1 tablet by mouth daily. May also take 2 tablets by mouth as needed for constipation   sodium chloride  1 g tablet Take 1 g by mouth 3 (three) times daily.   traMADol  50 MG tablet Commonly known as: ULTRAM  Take 1-2 tablets (50-100 mg total) by mouth every 6 (six) hours as needed for moderate pain (pain score 4-6).   traZODone  50 MG tablet Commonly known as: DESYREL  Take 50 mg by mouth at bedtime as needed for sleep.        Review of Systems  Constitutional:  Negative for appetite change, fatigue and fever.  HENT:  Negative for congestion and trouble swallowing.   Eyes:  Negative for visual disturbance.  Respiratory:  Negative for cough and shortness of breath.   Cardiovascular:  Positive for leg swelling.  Gastrointestinal:  Negative for abdominal pain, constipation, nausea and vomiting.  Genitourinary:  Positive for frequency. Negative for dysuria and urgency.  Musculoskeletal:  Positive for arthralgias and gait problem.  Skin:  Negative for color change.  Neurological:  Negative for weakness and headaches.  Psychiatric/Behavioral:  Negative for behavioral problems and sleep disturbance. The patient is nervous/anxious.     Immunization History  Administered  Date(s) Administered  . Influenza, High Dose Seasonal PF 08/04/2017, 07/27/2018, 07/27/2020  . Influenza,inj,Quad PF,6-35 Mos 12/26/2019  . PFIZER Comirnaty(Gray Top)Covid-19 Tri-Sucrose Vaccine 04/22/2021  . PFIZER(Purple Top)SARS-COV-2 Vaccination 11/17/2019, 12/06/2019  . PPD Test 10/13/2020  . Pfizer Covid-19 Vaccine Bivalent Booster 43yrs & up 08/27/2021  . Tdap 02/15/2022  . Zoster Recombinant(Shingrix) 01/18/2018, 03/21/2018, 04/20/2018   Pertinent  Health Maintenance Due  Topic Date Due  . DEXA SCAN  Never done  . INFLUENZA VACCINE  06/02/2023      09/21/2020    3:34 PM 09/26/2020    3:57 PM 08/07/2021    1:56 PM 02/15/2022    2:38 PM 03/17/2023    3:02 PM  Fall Risk  Falls in the past year?     0  Was there an injury with Fall?     0  Fall Risk Category Calculator     0  (RETIRED) Patient Fall Risk Level Low fall risk High fall risk Low fall risk Moderate fall risk   Patient at Risk for Falls Due to     No Fall Risks  Fall risk Follow up     Falls evaluation completed   Functional Status Survey:    Vitals:   11/07/23 1131 11/07/23 1405  BP: (!) 177/85 (!) 202/77  Pulse:  66  Resp:  18  Temp:  (!) 97.4 F (36.3 C)  SpO2:  95%  Weight:  131 lb 1.6 oz (59.5 kg)   Body mass index is 22.5 kg/m. Physical Exam Vitals and nursing note reviewed.  Constitutional:  Appearance: Normal appearance.  HENT:     Nose: Nose normal.     Mouth/Throat:     Mouth: Mucous membranes are moist.  Eyes:     Extraocular Movements: Extraocular movements intact.     Pupils: Pupils are equal, round, and reactive to light.  Cardiovascular:     Rate and Rhythm: Normal rate.     Heart sounds: No murmur heard. Pulmonary:     Breath sounds: No rales.  Abdominal:     General: Bowel sounds are normal.     Tenderness: There is no abdominal tenderness.  Musculoskeletal:        General: Tenderness present.     Cervical back: Normal range of motion.     Right lower leg: Edema  present.     Left lower leg: No edema.     Comments: C/o R hip pain with ROM 6/10, no shortened or external/internal rotation.   Skin:    General: Skin is warm and dry.     Comments: R hip surgical site is covered with dressing, clean  Neurological:     General: No focal deficit present.     Mental Status: She is alert and oriented to person, place, and time. Mental status is at baseline.     Motor: No weakness.     Gait: Gait abnormal.  Psychiatric:        Mood and Affect: Mood normal.    Labs reviewed: Recent Labs    06/13/23 0403 06/14/23 0357 06/15/23 0340 08/07/23 1245 09/10/23 1823 09/23/23 0000 09/29/23 0524 09/30/23 0614 10/01/23 0340  NA 128* 131* 133*   < > 130*   < > 132* 134* 135  K 4.0 4.0 3.8   < > 4.3   < > 3.9 3.6 3.7  CL 99 100 102   < > 96*   < > 104 101 104  CO2 21* 25 25   < > 25   < > 25 23 25   GLUCOSE 92 92 95   < > 91   < > 89 89 108*  BUN 14 12 11    < > 10   < > 18 16 12   CREATININE 1.36* 0.93 0.73   < > 1.02*   < > 1.00 0.79 0.78  CALCIUM  9.2 8.8* 8.7*   < > 10.3   < > 8.7* 9.0 9.0  MG 2.3 2.0 2.0  --  1.9  --   --   --   --   PHOS 3.9 3.3 3.6  --   --   --   --   --   --    < > = values in this interval not displayed.   Recent Labs    08/07/23 1245 09/08/23 1532 09/23/23 0000 09/26/23 2043  AST 21 21 19 29   ALT 15 18 14 23   ALKPHOS 60 76 76 82  BILITOT 1.0 0.7  --  0.7  PROT 6.6 7.3  --  6.6  ALBUMIN  4.0 4.3 3.7 3.6   Recent Labs    09/08/23 1532 09/10/23 1823 09/23/23 0000 09/26/23 2043 09/27/23 0607 09/29/23 0524 09/30/23 0614 10/01/23 0340  WBC 5.5   < > 5.3 5.2   < > 9.9 8.5 8.4  NEUTROABS 3.8  --  3,058.00 3.5  --   --   --   --   HGB 11.5*   < > 11.1* 11.8*   < > 9.3* 10.2* 10.8*  HCT 34.4*   < >  33* 36.3   < > 27.6* 30.0* 32.6*  MCV 94.2   < >  --  96.0   < > 92.3 91.7 90.8  PLT 239   < > 223 216   < > 172 192 227   < > = values in this interval not displayed.   Lab Results  Component Value Date   TSH 0.77  09/23/2023   No results found for: HGBA1C Lab Results  Component Value Date   CHOL 191 09/23/2023   HDL 56 09/23/2023   LDLCALC 112 09/23/2023   TRIG 118 09/23/2023    Significant Diagnostic Results in last 30 days:  No results found.  Assessment/Plan: Right hip pain Clemens 11/05/22 the bathroom and got herself back up, c/o R hip pain, able to bear weight and no shortened of the R leg.    Hospitalized 09/26/23-10/04/23 for closed R hip fracture, s/p ORIF, ASA 81mg  bid for DVT risk reduction, f/u Ortho 1 week, prn Tylenol , Tramadol  for pain.   Hereditary and idiopathic neuropathy, unspecified Managed with on Gabapentin   Hypertension due to endocrine disorder Blood pressure is controlled,  on Amlodipine , Losartan , Metoprolol , prn Hydralazine   Chronic kidney disease, stage 3a (HCC) Bun/creat 12/0.78 10/01/23  Major depressive disorder, recurrent severe without psychotic features (HCC) Stable, Alprazolam , Trazodone , Buspirone , Duloxetine , Mirtazapine , Olanzapine   Chronic constipation Stable, on Colace prn, Senokot   Hypothyroidism  taking Levothyroxine , TSH 0.77 09/23/23  Hyponatremia  salt tabs, Na 135 10/01/23    Family/ staff Communication: plan of care reviewed with the patient and charge nurse.   Labs/tests ordered: X-ray R hip 2 views  Time spend 30 minutes.

## 2023-11-08 DIAGNOSIS — M6281 Muscle weakness (generalized): Secondary | ICD-10-CM | POA: Diagnosis not present

## 2023-11-08 DIAGNOSIS — R2681 Unsteadiness on feet: Secondary | ICD-10-CM | POA: Diagnosis not present

## 2023-11-08 DIAGNOSIS — R41841 Cognitive communication deficit: Secondary | ICD-10-CM | POA: Diagnosis not present

## 2023-11-08 DIAGNOSIS — R29898 Other symptoms and signs involving the musculoskeletal system: Secondary | ICD-10-CM | POA: Diagnosis not present

## 2023-11-08 DIAGNOSIS — R278 Other lack of coordination: Secondary | ICD-10-CM | POA: Diagnosis not present

## 2023-11-08 DIAGNOSIS — R2689 Other abnormalities of gait and mobility: Secondary | ICD-10-CM | POA: Diagnosis not present

## 2023-11-09 DIAGNOSIS — R278 Other lack of coordination: Secondary | ICD-10-CM | POA: Diagnosis not present

## 2023-11-09 DIAGNOSIS — R41841 Cognitive communication deficit: Secondary | ICD-10-CM | POA: Diagnosis not present

## 2023-11-09 DIAGNOSIS — M6281 Muscle weakness (generalized): Secondary | ICD-10-CM | POA: Diagnosis not present

## 2023-11-09 DIAGNOSIS — R29898 Other symptoms and signs involving the musculoskeletal system: Secondary | ICD-10-CM | POA: Diagnosis not present

## 2023-11-09 DIAGNOSIS — R2681 Unsteadiness on feet: Secondary | ICD-10-CM | POA: Diagnosis not present

## 2023-11-09 DIAGNOSIS — R2689 Other abnormalities of gait and mobility: Secondary | ICD-10-CM | POA: Diagnosis not present

## 2023-11-10 ENCOUNTER — Telehealth: Payer: Self-pay | Admitting: *Deleted

## 2023-11-10 DIAGNOSIS — R29898 Other symptoms and signs involving the musculoskeletal system: Secondary | ICD-10-CM | POA: Diagnosis not present

## 2023-11-10 DIAGNOSIS — M6281 Muscle weakness (generalized): Secondary | ICD-10-CM | POA: Diagnosis not present

## 2023-11-10 DIAGNOSIS — R2689 Other abnormalities of gait and mobility: Secondary | ICD-10-CM | POA: Diagnosis not present

## 2023-11-10 DIAGNOSIS — R41841 Cognitive communication deficit: Secondary | ICD-10-CM | POA: Diagnosis not present

## 2023-11-10 DIAGNOSIS — R278 Other lack of coordination: Secondary | ICD-10-CM | POA: Diagnosis not present

## 2023-11-10 DIAGNOSIS — R2681 Unsteadiness on feet: Secondary | ICD-10-CM | POA: Diagnosis not present

## 2023-11-10 NOTE — Telephone Encounter (Signed)
 Received Forms from [employer] to fill out and give to Dr. Sherlynn.    Forms from [facility] [phone] and [employer] Insurance Policy Policy [id number]   To be faxed to [phone] once completed.  Given to [place] to send to Dr. Sherlynn.

## 2023-11-10 NOTE — Telephone Encounter (Signed)
 Paperwork Signed and Faxed.

## 2023-11-11 ENCOUNTER — Encounter: Payer: Self-pay | Admitting: Sports Medicine

## 2023-11-11 ENCOUNTER — Non-Acute Institutional Stay (SKILLED_NURSING_FACILITY): Payer: Self-pay | Admitting: Sports Medicine

## 2023-11-11 DIAGNOSIS — R2689 Other abnormalities of gait and mobility: Secondary | ICD-10-CM | POA: Diagnosis not present

## 2023-11-11 DIAGNOSIS — E871 Hypo-osmolality and hyponatremia: Secondary | ICD-10-CM | POA: Diagnosis not present

## 2023-11-11 DIAGNOSIS — K529 Noninfective gastroenteritis and colitis, unspecified: Secondary | ICD-10-CM | POA: Diagnosis not present

## 2023-11-11 DIAGNOSIS — M6281 Muscle weakness (generalized): Secondary | ICD-10-CM | POA: Diagnosis not present

## 2023-11-11 DIAGNOSIS — G4700 Insomnia, unspecified: Secondary | ICD-10-CM | POA: Diagnosis not present

## 2023-11-11 DIAGNOSIS — R278 Other lack of coordination: Secondary | ICD-10-CM | POA: Diagnosis not present

## 2023-11-11 DIAGNOSIS — F331 Major depressive disorder, recurrent, moderate: Secondary | ICD-10-CM | POA: Diagnosis not present

## 2023-11-11 DIAGNOSIS — R2681 Unsteadiness on feet: Secondary | ICD-10-CM | POA: Diagnosis not present

## 2023-11-11 DIAGNOSIS — R41841 Cognitive communication deficit: Secondary | ICD-10-CM | POA: Diagnosis not present

## 2023-11-11 DIAGNOSIS — R29898 Other symptoms and signs involving the musculoskeletal system: Secondary | ICD-10-CM | POA: Diagnosis not present

## 2023-11-11 NOTE — Progress Notes (Signed)
 Location:  [facility] Room Number: [id number] Place of Service:  SNF (31) Provider:  Sherlynn Madden, MD    Patient Care Team: Charlanne Fredia CROME, MD as PCP - General (Internal Medicine) Pietro Redell RAMAN, MD as PCP - Cardiology (Cardiology) Duke, Jon Garre, PA as Physician Assistant (Cardiology)  Extended Emergency Contact Information Primary Emergency Contact: [facility] [place] Phone: [phone] Relation: Son  Code Status:  Full Code Goals of care: Advanced Directive information    11/11/2023   10:51 AM  Advanced Directives  Does Patient Have a Medical Advance Directive? No  Does patient want to make changes to medical advance directive? No - Patient declined     Chief Complaint  Patient presents with   Acute Visit    hyponatremia    HPI:  Pt is a 83 y.o. female seen today for an acute visit for  nausea, vomiting  Pt seen and examined in her room,  pt c/o feeling nauseous and threw up yesterday  Pt c/o mild mid belly pain  Denies diarrhea Reports she ate  her breakfast and tolerated it well.  Pt denies headache, runny nose, cough, sob, chest pain, palpitations, dysuria, hematuria, bloodyr or dark stools.  Depression  Pt reports that she is doing well She is going out to eat her meals in the dining hall  Insomnia Currently on rameron Pt reports that rameron is helping her with sleep  Hyponatremia Pt wants her sodium levels to be checked.   Past Medical History:  Diagnosis Date   Adenomatous colon polyp    Adenomatous polyp of colon 12/03/2004   6mm   Anxiety    Chronic headaches    Chronic insomnia    Depression    Diverticulosis    External hemorrhoids    Hyperlipidemia    Hypertension    Hyperthyroidism    Hypothyroidism    Umbilical hernia    Past Surgical History:  Procedure Laterality Date   ABDOMINAL HYSTERECTOMY  1975   APPENDECTOMY  1975   COLONOSCOPY  03/03/2010,12/03/2004   ESOPHAGOGASTRODUODENOSCOPY   12/16/11   Dr. Lupita Commander   HIP ARTHROPLASTY Right 09/27/2023   Procedure: ARTHROPLASTY BIPOLAR HIP (HEMIARTHROPLASTY);  Surgeon: Vernetta Lonni GRADE, MD;  Location: [facility] OR;  Service: Orthopedics;  Laterality: Right;   OOPHORECTOMY     spinal lipoma removed  2006   TOTAL ABDOMINAL HYSTERECTOMY      Allergies  Allergen Reactions   Hydrocodone -Acetaminophen  Rash    Vicodin=REACTION: rash  Not listed on the [facility]   Iodinated Contrast Media Hives and Rash    Per patient she has developed hives and itching in upper trunk of body after last 2 CT scans once she got homes that she has just treated at home with benadryl . She has not told anyone until now. States she was able to bear it  Other reaction(s): Rash Per patient she has developed hives and itching in upper trunk of body after last 2 CT scans once she got homes that she has just treated at home with benadryl . She has not told anyone until now. States she was able to bear it   Not listed on the [facility]    Sulfa Antibiotics Rash    REACTION: rash Other reaction(s): Unknown   Codeine     Unknown reaction  Not listed on the [facility]    Sulfonamide Derivatives     REACTION: rash   Terbinafine     Unknown reaction  Not listed on the [facility]  Benazepril  Cough    Not listed on the [facility]     Outpatient Encounter Medications as of 11/11/2023  Medication Sig   ALPRAZolam  (XANAX ) 0.5 MG tablet Take 1 tablet (0.5 mg total) by mouth 3 (three) times daily as needed.   aspirin  81 MG chewable tablet Chew 1 tablet (81 mg total) by mouth 2 (two) times daily.   busPIRone  (BUSPAR ) 15 MG tablet Take 15 mg by mouth 2 (two) times daily.   Docusate Sodium  100 MG capsule Take 100 mg by mouth as needed for constipation.   DULoxetine  (CYMBALTA ) 60 MG capsule Take 60 mg by mouth daily.   feeding supplement (ENSURE ENLIVE / ENSURE PLUS) LIQD Take 237 mLs by mouth 2 (two) times daily between meals.   gabapentin  (NEURONTIN ) 100 MG capsule Take 200 mg by mouth 2  (two) times daily.   levothyroxine  (SYNTHROID ) 100 MCG tablet Take 100 mcg by mouth daily.   losartan  (COZAAR ) 50 MG tablet Take 50 mg by mouth 2 (two) times daily.   metoprolol  succinate (TOPROL -XL) 12.5 mg TB24 24 hr tablet Take 12.5 mg by mouth daily.   mirtazapine  (REMERON ) 7.5 MG tablet Take 7.5 mg by mouth at bedtime.   OLANZapine  (ZYPREXA ) 5 MG tablet Take 5 mg by mouth daily.   ondansetron  (ZOFRAN ) 8 MG tablet Take 8 mg by mouth as needed for nausea or vomiting.   polyethylene glycol (MIRALAX ) 17 g packet Take 17 g by mouth daily.   saccharomyces boulardii (FLORASTOR) 250 MG capsule Take 250 mg by mouth 2 (two) times daily.   senna (SENOKOT) 8.6 MG TABS tablet Take 1 tablet by mouth daily. May also take 2 tablets by mouth as needed for constipation   sodium chloride  1 g tablet Take 1 g by mouth 3 (three) times daily.   traZODone  (DESYREL ) 50 MG tablet Take 50 mg by mouth at bedtime as needed for sleep.   acetaminophen  (TYLENOL ) 325 MG tablet Take 650 mg by mouth every 4 (four) hours as needed (for pain). (Patient not taking: Reported on 11/11/2023)   amLODipine  (NORVASC ) 2.5 MG tablet Take 2.5 mg by mouth daily. (Patient not taking: Reported on 11/11/2023)   estradiol (ESTRACE) 0.1 MG/GM vaginal cream Place 1 Applicatorful vaginally 2 (two) times a week. Tuesday and Friday (Patient not taking: Reported on 11/11/2023)   traMADol  (ULTRAM ) 50 MG tablet Take 1-2 tablets (50-100 mg total) by mouth every 6 (six) hours as needed for moderate pain (pain score 4-6). (Patient not taking: Reported on 11/11/2023)   No facility-administered encounter medications on file as of 11/11/2023.    Review of Systems  Constitutional:  Negative for chills and fever.  HENT:  Negative for sinus pressure and sore throat.   Respiratory:  Negative for cough, shortness of breath and wheezing.   Cardiovascular:  Negative for chest pain, palpitations and leg swelling.  Gastrointestinal:  Positive for abdominal pain,  nausea and vomiting. Negative for abdominal distention, blood in stool, constipation and diarrhea.  Genitourinary:  Negative for dysuria, frequency and urgency.  Neurological:  Negative for dizziness, weakness and numbness.    Immunization History  Administered Date(s) Administered   Influenza, High Dose Seasonal PF 08/04/2017, 07/27/2018, 07/27/2020   Influenza,inj,Quad PF,6-35 Mos 12/26/2019   PFIZER Comirnaty(Gray Top)Covid-19 Tri-Sucrose Vaccine 04/22/2021   PFIZER(Purple Top)SARS-COV-2 Vaccination 11/17/2019, 12/06/2019   PPD Test 10/13/2020   Pfizer Covid-19 Vaccine Bivalent Booster 49yrs & up 08/27/2021   Tdap 02/15/2022   Zoster Recombinant(Shingrix) 01/18/2018, 03/21/2018, 04/20/2018   Pertinent  Health Maintenance Due  Topic Date Due   DEXA SCAN  Never done   INFLUENZA VACCINE  06/02/2023      09/21/2020    3:34 PM 09/26/2020    3:57 PM 08/07/2021    1:56 PM 02/15/2022    2:38 PM 03/17/2023    3:02 PM  Fall Risk  Falls in the past year?     0  Was there an injury with Fall?     0  Fall Risk Category Calculator     0  (RETIRED) Patient Fall Risk Level Low fall risk High fall risk Low fall risk Moderate fall risk   Patient at Risk for Falls Due to     No Fall Risks  Fall risk Follow up     Falls evaluation completed   Functional Status Survey:    Vitals:   11/11/23 1040  BP: (!) 140/74  Pulse: 70  Resp: 18  Temp: (!) 97.4 F (36.3 C)  SpO2: 95%  Weight: 131 lb 1.6 oz (59.5 kg)  Height: 5' 4 (1.626 m)   Body mass index is 22.5 kg/m. Physical Exam Constitutional:      Appearance: Normal appearance.  HENT:     Head: Normocephalic and atraumatic.  Cardiovascular:     Rate and Rhythm: Normal rate and regular rhythm.  Pulmonary:     Effort: Pulmonary effort is normal. No respiratory distress.     Breath sounds: Normal breath sounds. No wheezing.  Abdominal:     General: Bowel sounds are normal. There is no distension.     Tenderness: There is no  abdominal tenderness. There is no guarding or rebound.     Comments:    Musculoskeletal:        General: No swelling or tenderness.  Neurological:     Mental Status: She is alert. Mental status is at baseline.     Sensory: No sensory deficit.     Motor: No weakness.     Labs reviewed: Recent Labs    06/13/23 0403 06/14/23 0357 06/15/23 0340 08/07/23 1245 09/10/23 1823 09/23/23 0000 09/29/23 0524 09/30/23 0614 10/01/23 0340  NA 128* 131* 133*   < > 130*   < > 132* 134* 135  K 4.0 4.0 3.8   < > 4.3   < > 3.9 3.6 3.7  CL 99 100 102   < > 96*   < > 104 101 104  CO2 21* 25 25   < > 25   < > 25 23 25   GLUCOSE 92 92 95   < > 91   < > 89 89 108*  BUN 14 12 11    < > 10   < > 18 16 12   CREATININE 1.36* 0.93 0.73   < > 1.02*   < > 1.00 0.79 0.78  CALCIUM  9.2 8.8* 8.7*   < > 10.3   < > 8.7* 9.0 9.0  MG 2.3 2.0 2.0  --  1.9  --   --   --   --   PHOS 3.9 3.3 3.6  --   --   --   --   --   --    < > = values in this interval not displayed.   Recent Labs    08/07/23 1245 09/08/23 1532 09/23/23 0000 09/26/23 2043  AST 21 21 19 29   ALT 15 18 14 23   ALKPHOS 60 76 76 82  BILITOT 1.0 0.7  --  0.7  PROT 6.6  7.3  --  6.6  ALBUMIN  4.0 4.3 3.7 3.6   Recent Labs    09/08/23 1532 09/10/23 1823 09/23/23 0000 09/26/23 2043 09/27/23 0607 09/29/23 0524 09/30/23 0614 10/01/23 0340  WBC 5.5   < > 5.3 5.2   < > 9.9 8.5 8.4  NEUTROABS 3.8  --  3,058.00 3.5  --   --   --   --   HGB 11.5*   < > 11.1* 11.8*   < > 9.3* 10.2* 10.8*  HCT 34.4*   < > 33* 36.3   < > 27.6* 30.0* 32.6*  MCV 94.2   < >  --  96.0   < > 92.3 91.7 90.8  PLT 239   < > 223 216   < > 172 192 227   < > = values in this interval not displayed.   Lab Results  Component Value Date   TSH 0.77 09/23/2023   No results found for: HGBA1C Lab Results  Component Value Date   CHOL 191 09/23/2023   HDL 56 09/23/2023   LDLCALC 112 09/23/2023   TRIG 118 09/23/2023    Significant Diagnostic Results in last 30 days:  No  results found.  Assessment/Plan  1.  Gastroenteritis Pt with nausea, vomiting  Staff reported 2 episodes of diarrhea Will cont with zofran  prn for nausea Wills tart imodium 2mg  q6 prn for diarrhea Will check labs, cbc, bmp   2. Hyponatremia Will check bmp  3. Insomnia, unspecified type Cont with trazodone   4. Moderate recurrent major depression (HCC) GAD Cont with buspar , cymbalta , rameron, olazapine      Care plan discussed with the nursing staff  30 min Total time spent for obtaining history,  performing a medically appropriate examination and evaluation, reviewing the tests,ordering  tests,  documenting clinical information in the electronic or other health record, independently interpreting results ,care coordination (not separately reported)

## 2023-11-14 ENCOUNTER — Encounter: Payer: Self-pay | Admitting: Sports Medicine

## 2023-11-14 DIAGNOSIS — R278 Other lack of coordination: Secondary | ICD-10-CM | POA: Diagnosis not present

## 2023-11-14 DIAGNOSIS — R41841 Cognitive communication deficit: Secondary | ICD-10-CM | POA: Diagnosis not present

## 2023-11-14 DIAGNOSIS — R2689 Other abnormalities of gait and mobility: Secondary | ICD-10-CM | POA: Diagnosis not present

## 2023-11-14 DIAGNOSIS — R2681 Unsteadiness on feet: Secondary | ICD-10-CM | POA: Diagnosis not present

## 2023-11-14 DIAGNOSIS — R29898 Other symptoms and signs involving the musculoskeletal system: Secondary | ICD-10-CM | POA: Diagnosis not present

## 2023-11-14 DIAGNOSIS — M6281 Muscle weakness (generalized): Secondary | ICD-10-CM | POA: Diagnosis not present

## 2023-11-15 DIAGNOSIS — L821 Other seborrheic keratosis: Secondary | ICD-10-CM | POA: Diagnosis not present

## 2023-11-15 DIAGNOSIS — M6281 Muscle weakness (generalized): Secondary | ICD-10-CM | POA: Diagnosis not present

## 2023-11-15 DIAGNOSIS — R41841 Cognitive communication deficit: Secondary | ICD-10-CM | POA: Diagnosis not present

## 2023-11-15 DIAGNOSIS — L814 Other melanin hyperpigmentation: Secondary | ICD-10-CM | POA: Diagnosis not present

## 2023-11-15 DIAGNOSIS — R29898 Other symptoms and signs involving the musculoskeletal system: Secondary | ICD-10-CM | POA: Diagnosis not present

## 2023-11-15 DIAGNOSIS — R2689 Other abnormalities of gait and mobility: Secondary | ICD-10-CM | POA: Diagnosis not present

## 2023-11-15 DIAGNOSIS — D1801 Hemangioma of skin and subcutaneous tissue: Secondary | ICD-10-CM | POA: Diagnosis not present

## 2023-11-15 DIAGNOSIS — R278 Other lack of coordination: Secondary | ICD-10-CM | POA: Diagnosis not present

## 2023-11-15 DIAGNOSIS — R2681 Unsteadiness on feet: Secondary | ICD-10-CM | POA: Diagnosis not present

## 2023-11-16 DIAGNOSIS — R278 Other lack of coordination: Secondary | ICD-10-CM | POA: Diagnosis not present

## 2023-11-16 DIAGNOSIS — R2681 Unsteadiness on feet: Secondary | ICD-10-CM | POA: Diagnosis not present

## 2023-11-16 DIAGNOSIS — R29898 Other symptoms and signs involving the musculoskeletal system: Secondary | ICD-10-CM | POA: Diagnosis not present

## 2023-11-16 DIAGNOSIS — R2689 Other abnormalities of gait and mobility: Secondary | ICD-10-CM | POA: Diagnosis not present

## 2023-11-16 DIAGNOSIS — R41841 Cognitive communication deficit: Secondary | ICD-10-CM | POA: Diagnosis not present

## 2023-11-16 DIAGNOSIS — M6281 Muscle weakness (generalized): Secondary | ICD-10-CM | POA: Diagnosis not present

## 2023-11-17 DIAGNOSIS — R2689 Other abnormalities of gait and mobility: Secondary | ICD-10-CM | POA: Diagnosis not present

## 2023-11-17 DIAGNOSIS — R278 Other lack of coordination: Secondary | ICD-10-CM | POA: Diagnosis not present

## 2023-11-17 DIAGNOSIS — Z0189 Encounter for other specified special examinations: Secondary | ICD-10-CM | POA: Diagnosis not present

## 2023-11-17 DIAGNOSIS — R41841 Cognitive communication deficit: Secondary | ICD-10-CM | POA: Diagnosis not present

## 2023-11-17 DIAGNOSIS — R2681 Unsteadiness on feet: Secondary | ICD-10-CM | POA: Diagnosis not present

## 2023-11-17 DIAGNOSIS — R29898 Other symptoms and signs involving the musculoskeletal system: Secondary | ICD-10-CM | POA: Diagnosis not present

## 2023-11-17 DIAGNOSIS — M6281 Muscle weakness (generalized): Secondary | ICD-10-CM | POA: Diagnosis not present

## 2023-11-17 LAB — BASIC METABOLIC PANEL
BUN: 15 (ref 4–21)
CO2: 24 — AB (ref 13–22)
Chloride: 106 (ref 99–108)
Creatinine: 0.7 (ref 0.5–1.1)
Glucose: 116
Potassium: 4 meq/L (ref 3.5–5.1)
Sodium: 138 (ref 137–147)

## 2023-11-17 LAB — COMPREHENSIVE METABOLIC PANEL: Calcium: 9.3 (ref 8.7–10.7)

## 2023-11-18 ENCOUNTER — Encounter: Payer: Self-pay | Admitting: Sports Medicine

## 2023-11-18 ENCOUNTER — Non-Acute Institutional Stay (SKILLED_NURSING_FACILITY): Payer: Self-pay | Admitting: Sports Medicine

## 2023-11-18 DIAGNOSIS — R29898 Other symptoms and signs involving the musculoskeletal system: Secondary | ICD-10-CM | POA: Diagnosis not present

## 2023-11-18 DIAGNOSIS — K529 Noninfective gastroenteritis and colitis, unspecified: Secondary | ICD-10-CM | POA: Diagnosis not present

## 2023-11-18 DIAGNOSIS — R2681 Unsteadiness on feet: Secondary | ICD-10-CM | POA: Diagnosis not present

## 2023-11-18 DIAGNOSIS — E871 Hypo-osmolality and hyponatremia: Secondary | ICD-10-CM

## 2023-11-18 DIAGNOSIS — F321 Major depressive disorder, single episode, moderate: Secondary | ICD-10-CM | POA: Diagnosis not present

## 2023-11-18 DIAGNOSIS — R41841 Cognitive communication deficit: Secondary | ICD-10-CM | POA: Diagnosis not present

## 2023-11-18 DIAGNOSIS — R278 Other lack of coordination: Secondary | ICD-10-CM | POA: Diagnosis not present

## 2023-11-18 DIAGNOSIS — M6281 Muscle weakness (generalized): Secondary | ICD-10-CM | POA: Diagnosis not present

## 2023-11-18 DIAGNOSIS — R2689 Other abnormalities of gait and mobility: Secondary | ICD-10-CM | POA: Diagnosis not present

## 2023-11-18 MED ORDER — DULOXETINE HCL 60 MG PO CPEP: 60.0000 mg | ORAL_CAPSULE | Freq: Every day | ORAL | Status: DC

## 2023-11-18 MED ORDER — METOPROLOL SUCCINATE ER 25 MG PO TB24: 25.0000 mg | ORAL_TABLET | Freq: Every day | ORAL | Status: DC

## 2023-11-18 MED ORDER — MIRTAZAPINE 7.5 MG PO TABS: 15.0000 mg | ORAL_TABLET | Freq: Every day | ORAL | Status: DC

## 2023-11-18 NOTE — Progress Notes (Signed)
Provider:   Cathren Harsh Location:   [facility]   Place of Service:   Skilled care   PCP: Mahlon Gammon, MD Patient Care Team: Mahlon Gammon, MD as PCP - General (Internal Medicine) Jens Som Madolyn Frieze, MD as PCP - Cardiology (Cardiology) Duke, Roe Rutherford, PA as Physician Assistant (Cardiology)  Extended Emergency Contact Information Primary Emergency Contact: [facility] Phone: [phone] Relation: Son  Code Status:  Goals of Care: Advanced Directive information    11/11/2023   10:51 AM  Advanced Directives  Does Patient Have a Medical Advance Directive? No  Does patient want to make changes to medical advance directive? No - Patient declined      No chief complaint on file.   HPI: Patient is a 83 y.o. female seen today for acute visit for follow up on sodium levels. Pt was last seen  last week for gastroenteritis. Pt reports that r symptoms have since resolved. She reported a decreased appetite, skipping lunch due to lack of hunger, but was able to consume and retain breakfast without any issues of nausea or vomiting.  The patient's sodium levels were monitored and found to be within normal range at 138, despite not taking sodium tablets since 1 week. She patient expressed a desire to discontinue the sodium tablets, citing concerns about the frequency of administration.  In addition to the primary complaint, the patient reported intermittent tingling and numbness, but denied any feelings of dizziness or lightheadedness. She has been participating in physical therapy and is able to ambulate independently with a walker.  The patient's mood appeared to be improving, although she expressed sadness due to personal circumstances, including her husband's declining health and the need to vacate her current apartment. Despite these challenges, the patient's physical health showed signs of improvement, with clear lung sounds, a normal heart rhythm,  and no abdominal pain on examination.    Past Medical History:  Diagnosis Date   Adenomatous colon polyp    Adenomatous polyp of colon 12/03/2004   6mm   Anxiety    Chronic headaches    Chronic insomnia    Depression    Diverticulosis    External hemorrhoids    Hyperlipidemia    Hypertension    Hyperthyroidism    Hypothyroidism    Umbilical hernia    Past Surgical History:  Procedure Laterality Date   ABDOMINAL HYSTERECTOMY  1975   APPENDECTOMY  1975   COLONOSCOPY  03/03/2010,12/03/2004   ESOPHAGOGASTRODUODENOSCOPY  12/16/11   Dr. Stan Head   HIP ARTHROPLASTY Right 09/27/2023   Procedure: ARTHROPLASTY BIPOLAR HIP (HEMIARTHROPLASTY);  Surgeon: Kathryne Hitch, MD;  Location: [facility] OR;  Service: Orthopedics;  Laterality: Right;   OOPHORECTOMY     spinal lipoma removed  2006   TOTAL ABDOMINAL HYSTERECTOMY      reports that she quit smoking about 30 years ago. Her smoking use included cigarettes. She has never used smokeless tobacco. She reports current alcohol use of about 2.0 standard drinks of alcohol per week. She reports that she does not use drugs. Social History   Socioeconomic History   Marital status: Married    Spouse name: Not on file   Number of children: 2   Years of education: Not on file   Highest education level: Not on file  Occupational History   Occupation: retired  Tobacco Use   Smoking status: Former    Current packs/day: 0.00    Types: Cigarettes    Quit date: 01/23/1993  Years since quitting: 30.8   Smokeless tobacco: Never  Vaping Use   Vaping status: Never Used  Substance and Sexual Activity   Alcohol use: Yes    Alcohol/week: 2.0 standard drinks of alcohol    Types: 2 Glasses of wine per week   Drug use: No   Sexual activity: Not on file  Other Topics Concern   Not on file  Social History Narrative   Patient is married she is retired and has 2 children   About 2 alcoholic beverages a week no drugs, no current tobacco she is a  former smoker      Lives at home with her husband   Right handed   Social Drivers of Health   Financial Resource Strain: Low Risk  (10/03/2020)   Received from [facility], [facility]   Overall Financial Resource Strain (CARDIA)    Difficulty of Paying Living Expenses: Not hard at all  Food Insecurity: No Food Insecurity (09/27/2023)   Hunger Vital Sign    Worried About Running Out of Food in the Last Year: Never true    Ran Out of Food in the Last Year: Never true  Transportation Needs: No Transportation Needs (09/27/2023)   PRAPARE - Administrator, Civil Service (Medical): No    Lack of Transportation (Non-Medical): No  Physical Activity: Not on file  Stress: Stress Concern Present (10/03/2020)   Received from [facility], [facility] of Occupational Health - Occupational Stress Questionnaire    Feeling of Stress : Rather much  Social Connections: Unknown (03/16/2022)   Received from [facility], [facility]   Social Network    Social Network: Not on file  Intimate Partner Violence: Not At Risk (09/27/2023)   Humiliation, Afraid, Rape, and Kick questionnaire    Fear of Current or Ex-Partner: No    Emotionally Abused: No    Physically Abused: No    Sexually Abused: No    Functional Status Survey:    Family History  Problem Relation Age of Onset   Esophageal cancer Father    Stomach cancer Father        mets from esophagus   Heart disease Mother    Irritable bowel syndrome Mother    Kidney disease Mother    Other Mother        cushings disease   Addison's disease Mother    Heart failure Mother    Stroke Mother    Hypertension Mother    Stroke Maternal Grandmother    Hypertension Maternal Grandmother    Colon cancer Neg Hx    Heart attack Neg Hx     Health Maintenance  Topic Date Due   Pneumonia Vaccine 53+ Years old (1 of 2 - PCV) Never done   DEXA SCAN  Never done   INFLUENZA VACCINE  06/02/2023   COVID-19  Vaccine (5 - 2024-25 season) 07/03/2023   Medicare Annual Wellness (AWV)  12/02/2023   DTaP/Tdap/Td (2 - Td or Tdap) 02/16/2032   Zoster Vaccines- Shingrix  Completed   HPV VACCINES  Aged Out   Hepatitis C Screening  Discontinued    Allergies  Allergen Reactions   Hydrocodone-Acetaminophen Rash    Vicodin=REACTION: rash  Not listed on the MAR   Iodinated Contrast Media Hives and Rash    Per patient she has developed hives and itching in upper trunk of body after last 2 CT scans once she got homes that she has just treated at home  with benadryl. She has not told anyone until now. States she was able to bear it  Other reaction(s): Rash Per patient she has developed hives and itching in upper trunk of body after last 2 CT scans once she got homes that she has just treated at home with benadryl. She has not told anyone until now. States she was able to bear it   Not listed on the [facility]    Sulfa Antibiotics Rash    REACTION: rash Other reaction(s): Unknown   Codeine     Unknown reaction  Not listed on the [facility]    Sulfonamide Derivatives     REACTION: rash   Terbinafine     Unknown reaction  Not listed on the [facility]    Benazepril Cough    Not listed on the [facility]     Outpatient Encounter Medications as of 11/18/2023  Medication Sig   acetaminophen (TYLENOL) 325 MG tablet Take 650 mg by mouth every 4 (four) hours as needed (for pain). (Patient not taking: Reported on 11/11/2023)   ALPRAZolam (XANAX) 0.5 MG tablet Take 1 tablet (0.5 mg total) by mouth 3 (three) times daily as needed.   amLODipine (NORVASC) 2.5 MG tablet Take 2.5 mg by mouth daily. (Patient not taking: Reported on 11/11/2023)   aspirin 81 MG chewable tablet Chew 1 tablet (81 mg total) by mouth 2 (two) times daily.   busPIRone (BUSPAR) 15 MG tablet Take 15 mg by mouth 2 (two) times daily.   Docusate Sodium 100 MG capsule Take 100 mg by mouth as needed for constipation.   DULoxetine (CYMBALTA) 60 MG capsule Take 60 mg by  mouth daily.   estradiol (ESTRACE) 0.1 MG/GM vaginal cream Place 1 Applicatorful vaginally 2 (two) times a week. Tuesday and Friday (Patient not taking: Reported on 11/11/2023)   feeding supplement (ENSURE ENLIVE / ENSURE PLUS) LIQD Take 237 mLs by mouth 2 (two) times daily between meals.   gabapentin (NEURONTIN) 100 MG capsule Take 200 mg by mouth 2 (two) times daily.   levothyroxine (SYNTHROID) 100 MCG tablet Take 100 mcg by mouth daily.   losartan (COZAAR) 50 MG tablet Take 50 mg by mouth 2 (two) times daily.   metoprolol succinate (TOPROL-XL) 12.5 mg TB24 24 hr tablet Take 12.5 mg by mouth daily.   mirtazapine (REMERON) 7.5 MG tablet Take 7.5 mg by mouth at bedtime.   OLANZapine (ZYPREXA) 5 MG tablet Take 5 mg by mouth daily.   ondansetron (ZOFRAN) 8 MG tablet Take 8 mg by mouth as needed for nausea or vomiting.   polyethylene glycol (MIRALAX) 17 g packet Take 17 g by mouth daily.   saccharomyces boulardii (FLORASTOR) 250 MG capsule Take 250 mg by mouth 2 (two) times daily.   senna (SENOKOT) 8.6 MG TABS tablet Take 1 tablet by mouth daily. May also take 2 tablets by mouth as needed for constipation   sodium chloride 1 g tablet Take 1 g by mouth 3 (three) times daily.   traMADol (ULTRAM) 50 MG tablet Take 1-2 tablets (50-100 mg total) by mouth every 6 (six) hours as needed for moderate pain (pain score 4-6). (Patient not taking: Reported on 11/11/2023)   traZODone (DESYREL) 50 MG tablet Take 50 mg by mouth at bedtime as needed for sleep.   No facility-administered encounter medications on file as of 11/18/2023.    Review of Systems  Constitutional:  Negative for chills and fever.  HENT:  Negative for sinus pressure and sore throat.   Respiratory:  Negative for cough, shortness of breath and wheezing.   Cardiovascular:  Negative for chest pain, palpitations and leg swelling.  Gastrointestinal:  Negative for abdominal distention, abdominal pain, blood in stool, constipation, diarrhea, nausea  and vomiting.  Genitourinary:  Negative for dysuria, frequency and urgency.  Neurological:  Negative for dizziness, weakness and numbness.    There were no vitals filed for this visit. There is no height or weight on file to calculate BMI. Physical Exam Constitutional:      Appearance: Normal appearance.  HENT:     Head: Normocephalic and atraumatic.  Cardiovascular:     Rate and Rhythm: Normal rate and regular rhythm.  Pulmonary:     Effort: Pulmonary effort is normal. No respiratory distress.     Breath sounds: Normal breath sounds. No wheezing.  Abdominal:     General: Bowel sounds are normal. There is no distension.     Tenderness: There is no abdominal tenderness. There is no guarding or rebound.     Comments:    Musculoskeletal:        General: No swelling or tenderness.  Neurological:     Mental Status: She is alert. Mental status is at baseline.     Sensory: No sensory deficit.     Motor: No weakness.     Labs reviewed: Basic Metabolic Panel: Recent Labs    06/13/23 0403 06/14/23 0357 06/15/23 0340 08/07/23 1245 09/10/23 1823 09/23/23 0000 09/29/23 0524 09/30/23 0614 10/01/23 0340  NA 128* 131* 133*   < > 130*   < > 132* 134* 135  K 4.0 4.0 3.8   < > 4.3   < > 3.9 3.6 3.7  CL 99 100 102   < > 96*   < > 104 101 104  CO2 21* 25 25   < > 25   < > 25 23 25   GLUCOSE 92 92 95   < > 91   < > 89 89 108*  BUN 14 12 11    < > 10   < > 18 16 12   CREATININE 1.36* 0.93 0.73   < > 1.02*   < > 1.00 0.79 0.78  CALCIUM 9.2 8.8* 8.7*   < > 10.3   < > 8.7* 9.0 9.0  MG 2.3 2.0 2.0  --  1.9  --   --   --   --   PHOS 3.9 3.3 3.6  --   --   --   --   --   --    < > = values in this interval not displayed.   Liver Function Tests: Recent Labs    08/07/23 1245 09/08/23 1532 09/23/23 0000 09/26/23 2043  AST 21 21 19 29   ALT 15 18 14 23   ALKPHOS 60 76 76 82  BILITOT 1.0 0.7  --  0.7  PROT 6.6 7.3  --  6.6  ALBUMIN 4.0 4.3 3.7 3.6   Recent Labs    08/07/23 1245   LIPASE 36   No results for input(s): "AMMONIA" in the last 8760 hours. CBC: Recent Labs    09/08/23 1532 09/10/23 1823 09/23/23 0000 09/26/23 2043 09/27/23 0607 09/29/23 0524 09/30/23 0614 10/01/23 0340  WBC 5.5   < > 5.3 5.2   < > 9.9 8.5 8.4  NEUTROABS 3.8  --  3,058.00 3.5  --   --   --   --   HGB 11.5*   < > 11.1* 11.8*   < > 9.3* 10.2*  10.8*  HCT 34.4*   < > 33* 36.3   < > 27.6* 30.0* 32.6*  MCV 94.2   < >  --  96.0   < > 92.3 91.7 90.8  PLT 239   < > 223 216   < > 172 192 227   < > = values in this interval not displayed.   Cardiac Enzymes: No results for input(s): "CKTOTAL", "CKMB", "CKMBINDEX", "TROPONINI" in the last 8760 hours. BNP: Invalid input(s): "POCBNP" No results found for: "HGBA1C" Lab Results  Component Value Date   TSH 0.77 09/23/2023   Lab Results  Component Value Date   VITAMINB12 323 10/27/2023   Lab Results  Component Value Date   FOLATE 12.4 06/13/2023   Lab Results  Component Value Date   IRON 52 10/27/2023   TIBC 258 10/27/2023   FERRITIN 110 10/27/2023    Imaging and Procedures obtained prior to SNF admission: US RENAL Result Date: 09/27/2023 CLINICAL DATA:  Acute kidney injury EXAM: RENAL / URINARY TRACT ULTRASOUND COMPLETE COMPARISON:  CT 08/07/2023 FINDINGS: Right Kidney: Renal measurements: 9.4 x 4.8 x 4.6 cm = volume: 108.5 mL. Echogenicity within normal limits. Mild right hydronephrosis. Left Kidney: Renal measurements: 9.4 x 5.2 x 4 cm = volume: 102.1 mL. Echogenicity within normal limits. No mass or hydronephrosis. Bladder: Catheter in the bladder.  Moderate to marked bladder distension Other: None. IMPRESSION: 1. Mild right hydronephrosis. 2. Moderate to marked bladder distension with catheter in place. Correlate for catheter function Electronically Signed   By: Jasmine Pang M.D.   On: 09/27/2023 23:33   DG Pelvis Portable Result Date: 09/27/2023 CLINICAL DATA:  Postop. EXAM: PORTABLE PELVIS 1-2 VIEWS COMPARISON:   Preoperative imaging FINDINGS: Right hip hemiarthroplasty in expected alignment. No periprosthetic lucency or fracture. Recent postsurgical change includes air and edema in the soft tissues. Lateral skin staples in place. IMPRESSION: Right hip hemiarthroplasty without immediate postoperative complication. Electronically Signed   By: Narda Rutherford M.D.   On: 09/27/2023 17:36   DG HIP UNILAT WITH PELVIS 1V RIGHT Result Date: 09/27/2023 CLINICAL DATA:  [id number] Surgery, elective [id number] EXAM: DG HIP (WITH OR WITHOUT PELVIS) 1V RIGHT COMPARISON:  Pre-Operative imaging FINDINGS: Four fluoroscopic spot views of the pelvis and right hip obtained in the operating room. Images during hip arthroplasty. Fluoroscopy time 14 seconds. Dose 0.89 mGy. IMPRESSION: Intraoperative fluoroscopy during right hip arthroplasty. Electronically Signed   By: Narda Rutherford M.D.   On: 09/27/2023 17:36   DG C-Arm 1-60 Min-No Report Result Date: 09/27/2023 Fluoroscopy was utilized by the requesting physician.  No radiographic interpretation.   DG FEMUR PORT, MIN 2 VIEWS RIGHT Result Date: 09/27/2023 CLINICAL DATA:  Right hip fracture EXAM: RIGHT FEMUR PORTABLE 2 VIEW COMPARISON:  None Available. FINDINGS: Two view radiograph of the right femur again demonstrates an impacted subcapital right femoral neck fracture. No dislocation. Right hip joint space preserved. The distal fracture fragment consisting of the femoral shaft is intact. Limited images of the right knee are unremarkable. IMPRESSION: 1. Impacted subcapital right femoral neck fracture. Distal femur appears intact. Electronically Signed   By: Helyn Numbers M.D.   On: 09/27/2023 00:13   DG CHEST PORT 1 VIEW Result Date: 09/27/2023 CLINICAL DATA:  Right hip fracture. Medical clearance for surgical intervention EXAM: PORTABLE CHEST 1 VIEW COMPARISON:  None Available. FINDINGS: The heart size and mediastinal contours are within normal limits. Both lungs are clear. The  visualized skeletal structures are unremarkable. IMPRESSION: No active disease. Electronically Signed  By: Helyn Numbers M.D.   On: 09/27/2023 00:12   DG Hip Unilat W or Wo Pelvis 2-3 Views Right Result Date: 09/26/2023 CLINICAL DATA:  Fall EXAM: DG HIP (WITH OR WITHOUT PELVIS) 2-3V RIGHT COMPARISON:  None Available. FINDINGS: There is an acute, impacted subcapital right femoral neck fracture. The femoral head is still seated within the right acetabulum and right hip joint space is preserved. The pelvis and visualized left hip are intact. Sacroiliac and left hip joint spaces are preserved. Soft tissues are unremarkable. IMPRESSION: 1. Acute, impacted subcapital right femoral neck fracture. Electronically Signed   By: Helyn Numbers M.D.   On: 09/26/2023 22:02    Assessment/Plan   1. Hyponatremia (Primary) Sodium levels within normal limits despite not taking for about a week  Will stop sodium tablets Spoke with nursing staff who reported that pt did not receive sodium tab from 1/9 to 11/06/23 Will repeat bmp  monday  2. Gastroenteritis Resolved Cr wnl   3. Current moderate episode of major depressive disorder without prior episode (HCC) GAD  Pt reports concerns about her husband not doing well and the need to vacate her apartment  Otherwise doing well Cont with rameron , cymbalta, buspar, olanzapine, xanax  Will monitor   30 min Total time spent for obtaining history,  performing a medically appropriate examination and evaluation, medication reconciliation, reviewing the tests,ordering  tests,  documenting clinical information in the electronic or other health record,care coordination (not separately reported)

## 2023-11-21 DIAGNOSIS — R41841 Cognitive communication deficit: Secondary | ICD-10-CM | POA: Diagnosis not present

## 2023-11-21 DIAGNOSIS — R2689 Other abnormalities of gait and mobility: Secondary | ICD-10-CM | POA: Diagnosis not present

## 2023-11-21 DIAGNOSIS — R2681 Unsteadiness on feet: Secondary | ICD-10-CM | POA: Diagnosis not present

## 2023-11-21 DIAGNOSIS — R29898 Other symptoms and signs involving the musculoskeletal system: Secondary | ICD-10-CM | POA: Diagnosis not present

## 2023-11-21 DIAGNOSIS — Z0189 Encounter for other specified special examinations: Secondary | ICD-10-CM | POA: Diagnosis not present

## 2023-11-21 DIAGNOSIS — M6281 Muscle weakness (generalized): Secondary | ICD-10-CM | POA: Diagnosis not present

## 2023-11-21 DIAGNOSIS — R278 Other lack of coordination: Secondary | ICD-10-CM | POA: Diagnosis not present

## 2023-11-21 LAB — BASIC METABOLIC PANEL
BUN: 19 (ref 4–21)
CO2: 28 — AB (ref 13–22)
Chloride: 105 (ref 99–108)
Creatinine: 0.9 (ref 0.5–1.1)
Glucose: 86
Potassium: 4 meq/L (ref 3.5–5.1)
Sodium: 139 (ref 137–147)

## 2023-11-21 LAB — COMPREHENSIVE METABOLIC PANEL: Calcium: 9.2 (ref 8.7–10.7)

## 2023-11-22 DIAGNOSIS — R41841 Cognitive communication deficit: Secondary | ICD-10-CM | POA: Diagnosis not present

## 2023-11-22 DIAGNOSIS — R29898 Other symptoms and signs involving the musculoskeletal system: Secondary | ICD-10-CM | POA: Diagnosis not present

## 2023-11-22 DIAGNOSIS — R278 Other lack of coordination: Secondary | ICD-10-CM | POA: Diagnosis not present

## 2023-11-22 DIAGNOSIS — R2689 Other abnormalities of gait and mobility: Secondary | ICD-10-CM | POA: Diagnosis not present

## 2023-11-22 DIAGNOSIS — R2681 Unsteadiness on feet: Secondary | ICD-10-CM | POA: Diagnosis not present

## 2023-11-22 DIAGNOSIS — M6281 Muscle weakness (generalized): Secondary | ICD-10-CM | POA: Diagnosis not present

## 2023-11-23 ENCOUNTER — Other Ambulatory Visit (INDEPENDENT_AMBULATORY_CARE_PROVIDER_SITE_OTHER): Payer: Medicare PPO

## 2023-11-23 ENCOUNTER — Encounter: Payer: Self-pay | Admitting: Orthopaedic Surgery

## 2023-11-23 ENCOUNTER — Ambulatory Visit: Payer: Medicare PPO | Admitting: Orthopaedic Surgery

## 2023-11-23 DIAGNOSIS — R2681 Unsteadiness on feet: Secondary | ICD-10-CM | POA: Diagnosis not present

## 2023-11-23 DIAGNOSIS — R29898 Other symptoms and signs involving the musculoskeletal system: Secondary | ICD-10-CM | POA: Diagnosis not present

## 2023-11-23 DIAGNOSIS — Z96649 Presence of unspecified artificial hip joint: Secondary | ICD-10-CM

## 2023-11-23 DIAGNOSIS — Z96641 Presence of right artificial hip joint: Secondary | ICD-10-CM

## 2023-11-23 DIAGNOSIS — R2689 Other abnormalities of gait and mobility: Secondary | ICD-10-CM | POA: Diagnosis not present

## 2023-11-23 DIAGNOSIS — R41841 Cognitive communication deficit: Secondary | ICD-10-CM | POA: Diagnosis not present

## 2023-11-23 DIAGNOSIS — M6281 Muscle weakness (generalized): Secondary | ICD-10-CM | POA: Diagnosis not present

## 2023-11-23 DIAGNOSIS — R278 Other lack of coordination: Secondary | ICD-10-CM | POA: Diagnosis not present

## 2023-11-23 NOTE — Progress Notes (Signed)
The patient is getting close to 2 months status post a right hip hemiarthroplasty.  She is 83 years old.  She does ambulate with a walker.  She has had at least 2 small falls about a month ago.  She does hurt with that hip when she turns over on that side in bed.  I gave her reassurance that is usually quite normal.  She did have a large seroma after her first visit with [place].  On examination today she does tolerate me easily putting her right hip through internal and external rotation.  Her incisions well-healed and there is no significant seroma today at all.  Standing AP pelvis and lateral of the right hip shows a well-seated hemiarthroplasty with no complicating features.  Her son is with her today.  He help me in terms of giving her reassurance that this looks normal and she should certainly go slow and hopefully this will continue to improve with time.  She will keep her regular follow-up with [place].  If there are any issues, she knows to not hesitate to come in to be seen earlier if needed.

## 2023-11-24 DIAGNOSIS — R278 Other lack of coordination: Secondary | ICD-10-CM | POA: Diagnosis not present

## 2023-11-24 DIAGNOSIS — R29898 Other symptoms and signs involving the musculoskeletal system: Secondary | ICD-10-CM | POA: Diagnosis not present

## 2023-11-24 DIAGNOSIS — M6281 Muscle weakness (generalized): Secondary | ICD-10-CM | POA: Diagnosis not present

## 2023-11-24 DIAGNOSIS — R2681 Unsteadiness on feet: Secondary | ICD-10-CM | POA: Diagnosis not present

## 2023-11-24 DIAGNOSIS — R41841 Cognitive communication deficit: Secondary | ICD-10-CM | POA: Diagnosis not present

## 2023-11-24 DIAGNOSIS — R2689 Other abnormalities of gait and mobility: Secondary | ICD-10-CM | POA: Diagnosis not present

## 2023-11-25 ENCOUNTER — Non-Acute Institutional Stay (SKILLED_NURSING_FACILITY): Payer: Medicare PPO | Admitting: Nurse Practitioner

## 2023-11-25 ENCOUNTER — Encounter: Payer: Self-pay | Admitting: Nurse Practitioner

## 2023-11-25 DIAGNOSIS — G609 Hereditary and idiopathic neuropathy, unspecified: Secondary | ICD-10-CM | POA: Diagnosis not present

## 2023-11-25 DIAGNOSIS — N1831 Chronic kidney disease, stage 3a: Secondary | ICD-10-CM | POA: Diagnosis not present

## 2023-11-25 DIAGNOSIS — E871 Hypo-osmolality and hyponatremia: Secondary | ICD-10-CM | POA: Diagnosis not present

## 2023-11-25 DIAGNOSIS — I10 Essential (primary) hypertension: Secondary | ICD-10-CM

## 2023-11-25 DIAGNOSIS — F332 Major depressive disorder, recurrent severe without psychotic features: Secondary | ICD-10-CM

## 2023-11-25 NOTE — Assessment & Plan Note (Signed)
Bun/creat 19/0.92 11/21/23

## 2023-11-25 NOTE — Assessment & Plan Note (Signed)
off salt tabs, Na 139 11/21/23

## 2023-11-25 NOTE — Assessment & Plan Note (Signed)
taking Levothyroxine, TSH 0.77 09/23/23

## 2023-11-25 NOTE — Assessment & Plan Note (Signed)
Stable, continue Alprazolam, Trazodone, Buspirone, Duloxetine, Mirtazapine, Olanzapine

## 2023-11-25 NOTE — Assessment & Plan Note (Addendum)
elevated Sbp trended up from 140s to 170s, average Dbp in 70s. patient stated she feels intermittent shaky, mild headaches, some palpitation Continue Losartan, increase Metoprolol 25mg  bid/daily, off prn Hydralazine, Amlodipine. Observe.

## 2023-11-25 NOTE — Assessment & Plan Note (Signed)
on Gabapentin ?

## 2023-11-25 NOTE — Progress Notes (Unsigned)
Location:   SNF [facility] Room Number: 68 Place of Service:  SNF (31) Provider: Arna Snipe Riki Berninger NP  Mahlon Gammon, MD  Patient Care Team: Mahlon Gammon, MD as PCP - General (Internal Medicine) Jens Som Madolyn Frieze, MD as PCP - Cardiology (Cardiology) Duke, Roe Rutherford, PA as Physician Assistant (Cardiology)  Extended Emergency Contact Information Primary Emergency Contact: [facility] Phone: [phone] Relation: Son  Code Status: DNR Goals of care: Advanced Directive information    11/11/2023   10:51 AM  Advanced Directives  Does Patient Have a Medical Advance Directive? No  Does patient want to make changes to medical advance directive? No - Patient declined     Chief Complaint  Patient presents with  . Acute Visit    Elevated Sbp    HPI:  Pt is a 83 y.o. female seen today for an acute visit for elevated Sbp trended up from 140s to 170s, average Dbp in 70s, the patient stated she feels intermittent shaky, mild headaches, some palpitation     Hospitalized 09/26/23-10/04/23 for closed R hip fracture, s/p ORIF, healed.              Peripheral neuropathy, on Gabapentin             HTN, on Losartan, Metoprolol, off prn Hydralazine, Amlodipine.             CKD, Bun/creat 19/0.92 11/21/23             Depression, Alprazolam, Trazodone, Buspirone, Duloxetine, Mirtazapine, Olanzapine             Constipation, on Colace prn, Senokot              Atrophic vaginitis, Estradiol vaginal 2x/wk             Hyponatremia, off salt tabs, Na 139 11/21/23             Hypothyroidism, taking Levothyroxine, TSH 0.77 09/23/23             GERD, Hgb 10.8 10/01/23, Iron 52 10/27/23    Past Medical History:  Diagnosis Date  . Adenomatous colon polyp   . Adenomatous polyp of colon 12/03/2004   6mm  . Anxiety   . Chronic headaches   . Chronic insomnia   . Depression   . Diverticulosis   . External hemorrhoids   . Hyperlipidemia   . Hypertension   . Hyperthyroidism   .  Hypothyroidism   . Umbilical hernia    Past Surgical History:  Procedure Laterality Date  . ABDOMINAL HYSTERECTOMY  1975  . APPENDECTOMY  1975  . COLONOSCOPY  03/03/2010,12/03/2004  . ESOPHAGOGASTRODUODENOSCOPY  12/16/11   Dr. Stan Head  . HIP ARTHROPLASTY Right 09/27/2023   Procedure: ARTHROPLASTY BIPOLAR HIP (HEMIARTHROPLASTY);  Surgeon: Kathryne Hitch, MD;  Location: [facility] OR;  Service: Orthopedics;  Laterality: Right;  . OOPHORECTOMY    . spinal lipoma removed  2006  . TOTAL ABDOMINAL HYSTERECTOMY      Allergies  Allergen Reactions  . Hydrocodone-Acetaminophen Rash    Vicodin=REACTION: rash  Not listed on the [facility]  . Iodinated Contrast Media Hives and Rash    Per patient she has developed hives and itching in upper trunk of body after last 2 CT scans once she got homes that she has just treated at home with benadryl. She has not told anyone until now. States she was able to bear it  Other reaction(s): Rash Per patient she has developed hives and itching in  upper trunk of body after last 2 CT scans once she got homes that she has just treated at home with benadryl. She has not told anyone until now. States she was able to bear it   Not listed on the [facility]   . Sulfa Antibiotics Rash    REACTION: rash Other reaction(s): Unknown  . Codeine     Unknown reaction  Not listed on the [facility]   . Sulfonamide Derivatives     REACTION: rash  . Terbinafine     Unknown reaction  Not listed on the [facility]   . Benazepril Cough    Not listed on the [facility]     Allergies as of 11/25/2023       Reactions   Hydrocodone-acetaminophen Rash   Vicodin=REACTION: rash Not listed on the [facility]   Iodinated Contrast Media Hives, Rash   Per patient she has developed hives and itching in upper trunk of body after last 2 CT scans once she got homes that she has just treated at home with benadryl. She has not told anyone until now. States she was able to bear it  Other reaction(s): Rash Per patient she  has developed hives and itching in upper trunk of body after last 2 CT scans once she got homes that she has just treated at home with benadryl. She has not told anyone until now. States she was able to bear it  Not listed on the [facility]   Sulfa Antibiotics Rash   REACTION: rash Other reaction(s): Unknown   Codeine    Unknown reaction  Not listed on the [facility]   Sulfonamide Derivatives    REACTION: rash   Terbinafine    Unknown reaction Not listed on the [facility]   Benazepril Cough   Not listed on the [facility]        Medication List        Accurate as of November 25, 2023  4:09 PM. If you have any questions, ask your nurse or doctor.          acetaminophen 325 MG tablet Commonly known as: TYLENOL Take 650 mg by mouth every 4 (four) hours as needed (for pain).   ALPRAZolam 0.5 MG tablet Commonly known as: XANAX Take 1 tablet (0.5 mg total) by mouth 3 (three) times daily as needed.   amLODipine 2.5 MG tablet Commonly known as: NORVASC Take 2.5 mg by mouth daily.   aspirin 81 MG chewable tablet Chew 1 tablet (81 mg total) by mouth 2 (two) times daily.   busPIRone 15 MG tablet Commonly known as: BUSPAR Take 15 mg by mouth 2 (two) times daily.   Docusate Sodium 100 MG capsule Take 100 mg by mouth as needed for constipation.   DULoxetine 60 MG capsule Commonly known as: CYMBALTA Take 1 capsule (60 mg total) by mouth daily. Total daily dose 90 mg   estradiol 0.1 MG/GM vaginal cream Commonly known as: ESTRACE Place 1 Applicatorful vaginally 2 (two) times a week. Tuesday and Friday   feeding supplement Liqd Take 237 mLs by mouth 2 (two) times daily between meals.   gabapentin 100 MG capsule Commonly known as: NEURONTIN Take 200 mg by mouth 2 (two) times daily.   levothyroxine 100 MCG tablet Commonly known as: SYNTHROID Take 100 mcg by mouth daily.   losartan 50 MG tablet Commonly known as: COZAAR Take 50 mg by mouth 2 (two) times daily.   metoprolol succinate 25 MG 24  hr tablet Commonly known as: Toprol XL Take  1 tablet (25 mg total) by mouth daily.   mirtazapine 7.5 MG tablet Commonly known as: REMERON Take 2 tablets (15 mg total) by mouth at bedtime.   OLANZapine 5 MG tablet Commonly known as: ZYPREXA Take 5 mg by mouth daily.   ondansetron 8 MG tablet Commonly known as: ZOFRAN Take 8 mg by mouth as needed for nausea or vomiting.   polyethylene glycol 17 g packet Commonly known as: MiraLax Take 17 g by mouth daily.   saccharomyces boulardii 250 MG capsule Commonly known as: FLORASTOR Take 250 mg by mouth 2 (two) times daily.   senna 8.6 MG Tabs tablet Commonly known as: SENOKOT Take 1 tablet by mouth daily. May also take 2 tablets by mouth as needed for constipation   traZODone 50 MG tablet Commonly known as: DESYREL Take 50 mg by mouth at bedtime as needed for sleep.        Review of Systems  Constitutional:  Negative for appetite change, fatigue and fever.  HENT:  Negative for congestion and trouble swallowing.   Eyes:  Negative for visual disturbance.  Respiratory:  Negative for cough and shortness of breath.   Cardiovascular:  Positive for leg swelling.  Gastrointestinal:  Negative for abdominal pain, constipation, nausea and vomiting.  Genitourinary:  Positive for frequency. Negative for dysuria and urgency.  Musculoskeletal:  Positive for arthralgias and gait problem.  Skin:  Negative for color change.  Neurological:  Negative for weakness and headaches.  Psychiatric/Behavioral:  Negative for behavioral problems and sleep disturbance. The patient is nervous/anxious.     Immunization History  Administered Date(s) Administered  . Influenza, High Dose Seasonal PF 08/04/2017, 07/27/2018, 07/27/2020  . Influenza,inj,Quad PF,6-35 Mos 12/26/2019  . PFIZER Comirnaty(Gray Top)Covid-19 Tri-Sucrose Vaccine 04/22/2021  . PFIZER(Purple Top)SARS-COV-2 Vaccination 11/17/2019, 12/06/2019  . PPD Test 10/13/2020  . Special educational needs teacher 81yrs & up 08/27/2021  . Tdap 02/15/2022  . Zoster Recombinant(Shingrix) 01/18/2018, 03/21/2018, 04/20/2018   Pertinent  Health Maintenance Due  Topic Date Due  . DEXA SCAN  Never done  . INFLUENZA VACCINE  06/02/2023      09/21/2020    3:34 PM 09/26/2020    3:57 PM 08/07/2021    1:56 PM 02/15/2022    2:38 PM 03/17/2023    3:02 PM  Fall Risk  Falls in the past year?     0  Was there an injury with Fall?     0  Fall Risk Category Calculator     0  (RETIRED) Patient Fall Risk Level Low fall risk High fall risk Low fall risk Moderate fall risk   Patient at Risk for Falls Due to     No Fall Risks  Fall risk Follow up     Falls evaluation completed   Functional Status Survey:    Vitals:   11/25/23 1506  BP: (!) 173/76  Pulse: 74  Resp: 18  Temp: (!) 97.4 F (36.3 C)  SpO2: 95%  Weight: 131 lb 1.6 oz (59.5 kg)   Body mass index is 22.5 kg/m. Physical Exam Vitals and nursing note reviewed.  Constitutional:      Appearance: Normal appearance.  HENT:     Nose: Nose normal.     Mouth/Throat:     Mouth: Mucous membranes are moist.  Eyes:     Extraocular Movements: Extraocular movements intact.     Pupils: Pupils are equal, round, and reactive to light.  Cardiovascular:     Rate and Rhythm: Normal rate.  Heart sounds: No murmur heard. Pulmonary:     Breath sounds: No rales.  Abdominal:     General: Bowel sounds are normal.     Tenderness: There is no abdominal tenderness.  Musculoskeletal:        General: Tenderness present.     Cervical back: Normal range of motion.     Right lower leg: Edema present.     Left lower leg: No edema.     Comments: C/o R hip pain with ROM 6/10, no shortened or external/internal rotation.   Skin:    General: Skin is warm and dry.     Comments: R hip surgical site is covered with dressing, clean  Neurological:     General: No focal deficit present.     Mental Status: She is alert and oriented to person,  place, and time. Mental status is at baseline.     Motor: No weakness.     Gait: Gait abnormal.  Psychiatric:        Mood and Affect: Mood normal.    Labs reviewed: Recent Labs    06/13/23 0403 06/14/23 0357 06/15/23 0340 08/07/23 1245 09/10/23 1823 09/23/23 0000 09/29/23 0524 09/30/23 0614 10/01/23 0340  NA 128* 131* 133*   < > 130*   < > 132* 134* 135  K 4.0 4.0 3.8   < > 4.3   < > 3.9 3.6 3.7  CL 99 100 102   < > 96*   < > 104 101 104  CO2 21* 25 25   < > 25   < > 25 23 25   GLUCOSE 92 92 95   < > 91   < > 89 89 108*  BUN 14 12 11    < > 10   < > 18 16 12   CREATININE 1.36* 0.93 0.73   < > 1.02*   < > 1.00 0.79 0.78  CALCIUM 9.2 8.8* 8.7*   < > 10.3   < > 8.7* 9.0 9.0  MG 2.3 2.0 2.0  --  1.9  --   --   --   --   PHOS 3.9 3.3 3.6  --   --   --   --   --   --    < > = values in this interval not displayed.   Recent Labs    08/07/23 1245 09/08/23 1532 09/23/23 0000 09/26/23 2043  AST 21 21 19 29   ALT 15 18 14 23   ALKPHOS 60 76 76 82  BILITOT 1.0 0.7  --  0.7  PROT 6.6 7.3  --  6.6  ALBUMIN 4.0 4.3 3.7 3.6   Recent Labs    09/08/23 1532 09/10/23 1823 09/23/23 0000 09/26/23 2043 09/27/23 0607 09/29/23 0524 09/30/23 0614 10/01/23 0340  WBC 5.5   < > 5.3 5.2   < > 9.9 8.5 8.4  NEUTROABS 3.8  --  3,058.00 3.5  --   --   --   --   HGB 11.5*   < > 11.1* 11.8*   < > 9.3* 10.2* 10.8*  HCT 34.4*   < > 33* 36.3   < > 27.6* 30.0* 32.6*  MCV 94.2   < >  --  96.0   < > 92.3 91.7 90.8  PLT 239   < > 223 216   < > 172 192 227   < > = values in this interval not displayed.   Lab Results  Component Value Date   TSH  0.77 09/23/2023   No results found for: "HGBA1C" Lab Results  Component Value Date   CHOL 191 09/23/2023   HDL 56 09/23/2023   LDLCALC 112 09/23/2023   TRIG 118 09/23/2023    Significant Diagnostic Results in last 30 days:  XR HIP UNILAT W OR W/O PELVIS 2-3 VIEWS RIGHT Result Date: 11/23/2023 An AP pelvis and lateral the right hip shows a  well-seated hemiarthroplasty with no complicating features.   Assessment/Plan: Essential (primary) hypertension elevated Sbp trended up from 140s to 170s, average Dbp in 70s. patient stated she feels intermittent shaky, mild headaches, some palpitation Continue Losartan, increase Metoprolol 25mg  bid/daily, off prn Hydralazine, Amlodipine. Observe.  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   Labs/tests ordered:  none  Time spend 30 minutes.

## 2023-11-28 ENCOUNTER — Non-Acute Institutional Stay (SKILLED_NURSING_FACILITY): Payer: Medicare PPO | Admitting: Sports Medicine

## 2023-11-28 ENCOUNTER — Encounter: Payer: Self-pay | Admitting: Sports Medicine

## 2023-11-28 DIAGNOSIS — R41841 Cognitive communication deficit: Secondary | ICD-10-CM | POA: Diagnosis not present

## 2023-11-28 DIAGNOSIS — F5101 Primary insomnia: Secondary | ICD-10-CM | POA: Diagnosis not present

## 2023-11-28 DIAGNOSIS — F411 Generalized anxiety disorder: Secondary | ICD-10-CM | POA: Diagnosis not present

## 2023-11-28 DIAGNOSIS — M6281 Muscle weakness (generalized): Secondary | ICD-10-CM | POA: Diagnosis not present

## 2023-11-28 DIAGNOSIS — R29898 Other symptoms and signs involving the musculoskeletal system: Secondary | ICD-10-CM | POA: Diagnosis not present

## 2023-11-28 DIAGNOSIS — E871 Hypo-osmolality and hyponatremia: Secondary | ICD-10-CM | POA: Diagnosis not present

## 2023-11-28 DIAGNOSIS — R278 Other lack of coordination: Secondary | ICD-10-CM | POA: Diagnosis not present

## 2023-11-28 DIAGNOSIS — R2681 Unsteadiness on feet: Secondary | ICD-10-CM | POA: Diagnosis not present

## 2023-11-28 DIAGNOSIS — R2689 Other abnormalities of gait and mobility: Secondary | ICD-10-CM | POA: Diagnosis not present

## 2023-11-28 NOTE — Progress Notes (Signed)
Provider:  Venita Sheffield, MD Location:   [facility]   Place of Service:   Skilled care   PCP: Mahlon Gammon, MD Patient Care Team: Mahlon Gammon, MD as PCP - General (Internal Medicine) Lewayne Bunting, MD as PCP - Cardiology (Cardiology) Duke, Roe Rutherford, PA as Physician Assistant (Cardiology)  Extended Emergency Contact Information Primary Emergency Contact: [facility] Phone: [phone] Relation: Son  Code Status:  Goals of Care: Advanced Directive information    11/11/2023   10:51 AM  Advanced Directives  Does Patient Have a Medical Advance Directive? No  Does patient want to make changes to medical advance directive? No - Patient declined      No chief complaint on file.   HPI: Patient is a 83 y.o. female seen today for follow up on medication management.  Pt seen and examined in her room, she is working with occupational therapy  Her son visiting her  Pt reports some anxiety and receiving xanax daily  Reports intermittent waking up at night  Tried trazodone and says it did not help her much  Pt denies cough, runny nose, sob, abdominal pain, nausea, vomiting, diarrhea, dysuria.    Past Medical History:  Diagnosis Date   Adenomatous colon polyp    Adenomatous polyp of colon 12/03/2004   6mm   Anxiety    Chronic headaches    Chronic insomnia    Depression    Diverticulosis    External hemorrhoids    Hyperlipidemia    Hypertension    Hyperthyroidism    Hypothyroidism    Umbilical hernia    Past Surgical History:  Procedure Laterality Date   ABDOMINAL HYSTERECTOMY  1975   APPENDECTOMY  1975   COLONOSCOPY  03/03/2010,12/03/2004   ESOPHAGOGASTRODUODENOSCOPY  12/16/11   Dr. Stan Head   HIP ARTHROPLASTY Right 09/27/2023   Procedure: ARTHROPLASTY BIPOLAR HIP (HEMIARTHROPLASTY);  Surgeon: Kathryne Hitch, MD;  Location: [facility] OR;  Service: Orthopedics;  Laterality: Right;   OOPHORECTOMY     spinal lipoma removed   2006   TOTAL ABDOMINAL HYSTERECTOMY      reports that she quit smoking about 30 years ago. Her smoking use included cigarettes. She has never used smokeless tobacco. She reports current alcohol use of about 2.0 standard drinks of alcohol per week. She reports that she does not use drugs. Social History   Socioeconomic History   Marital status: Married    Spouse name: Not on file   Number of children: 2   Years of education: Not on file   Highest education level: Not on file  Occupational History   Occupation: retired  Tobacco Use   Smoking status: Former    Current packs/day: 0.00    Types: Cigarettes    Quit date: 01/23/1993    Years since quitting: 30.8   Smokeless tobacco: Never  Vaping Use   Vaping status: Never Used  Substance and Sexual Activity   Alcohol use: Yes    Alcohol/week: 2.0 standard drinks of alcohol    Types: 2 Glasses of wine per week   Drug use: No   Sexual activity: Not on file  Other Topics Concern   Not on file  Social History Narrative   Patient is married she is retired and has 2 children   About 2 alcoholic beverages a week no drugs, no current tobacco she is a former smoker      Lives at home with her husband   Right handed   Social  Drivers of Health   Financial Resource Strain: Low Risk  (10/03/2020)   Received [facility], [facility]   Overall Financial Resource Strain (CARDIA)    Difficulty of Paying Living Expenses: Not hard at all  Food Insecurity: No Food Insecurity (09/27/2023)   Hunger Vital Sign    Worried About Running Out of Food in the Last Year: Never true    Ran Out of Food in the Last Year: Never true  Transportation Needs: No Transportation Needs (09/27/2023)   PRAPARE - Administrator, Civil Service (Medical): No    Lack of Transportation (Non-Medical): No  Physical Activity: Not on file  Stress: Stress Concern Present (10/03/2020)   Received from [facility]  Occupational Health - Occupational Stress Questionnaire    Feeling of Stress : Rather much  Social Connections: Unknown (03/16/2022)   Received from [facility], [facility]   Social Network    Social Network: Not on file  Intimate Partner Violence: Not At Risk (09/27/2023)   Humiliation, Afraid, Rape, and Kick questionnaire    Fear of Current or Ex-Partner: No    Emotionally Abused: No    Physically Abused: No    Sexually Abused: No    Functional Status Survey:    Family History  Problem Relation Age of Onset   Esophageal cancer Father    Stomach cancer Father        mets from esophagus   Heart disease Mother    Irritable bowel syndrome Mother    Kidney disease Mother    Other Mother        cushings disease   Addison's disease Mother    Heart failure Mother    Stroke Mother    Hypertension Mother    Stroke Maternal Grandmother    Hypertension Maternal Grandmother    Colon cancer Neg Hx    Heart attack Neg Hx     Health Maintenance  Topic Date Due   Pneumonia Vaccine 83+ Years old (1 of 2 - PCV) Never done   DEXA SCAN  Never done   INFLUENZA VACCINE  06/02/2023   COVID-19 Vaccine (5 - 2024-25 season) 07/03/2023   Medicare Annual Wellness (AWV)  12/02/2023   DTaP/Tdap/Td (2 - Td or Tdap) 02/16/2032   Zoster Vaccines- Shingrix  Completed   HPV VACCINES  Aged Out   Hepatitis C Screening  Discontinued    Allergies  Allergen Reactions   Hydrocodone-Acetaminophen Rash    Vicodin=REACTION: rash  Not listed on the MAR   Iodinated Contrast Media Hives and Rash    Per patient she has developed hives and itching in upper trunk of body after last 2 CT scans once she got homes that she has just treated at home with benadryl. She has not told anyone until now. States she was able to bear it  Other reaction(s): Rash Per patient she has developed hives and itching in upper trunk of body after last 2 CT scans once she got homes that she has just treated at home with  benadryl. She has not told anyone until now. States she was able to bear it   Not listed on the [facility]    Sulfa Antibiotics Rash    REACTION: rash Other reaction(s): Unknown   Codeine     Unknown reaction  Not listed on the MAR    Sulfonamide Derivatives     REACTION: rash   Terbinafine     Unknown  reaction  Not listed on the [facility]    Benazepril Cough    Not listed on the [facility]     Outpatient Encounter Medications as of 11/28/2023  Medication Sig   acetaminophen (TYLENOL) 325 MG tablet Take 650 mg by mouth every 4 (four) hours as needed (for pain). (Patient not taking: Reported on 11/11/2023)   ALPRAZolam (XANAX) 0.5 MG tablet Take 1 tablet (0.5 mg total) by mouth 3 (three) times daily as needed.   amLODipine (NORVASC) 2.5 MG tablet Take 2.5 mg by mouth daily. (Patient not taking: Reported on 11/11/2023)   aspirin 81 MG chewable tablet Chew 1 tablet (81 mg total) by mouth 2 (two) times daily.   busPIRone (BUSPAR) 15 MG tablet Take 15 mg by mouth 2 (two) times daily.   Docusate Sodium 100 MG capsule Take 100 mg by mouth as needed for constipation.   DULoxetine (CYMBALTA) 60 MG capsule Take 1 capsule (60 mg total) by mouth daily. Total daily dose 90 mg   estradiol (ESTRACE) 0.1 MG/GM vaginal cream Place 1 Applicatorful vaginally 2 (two) times a week. Tuesday and Friday (Patient not taking: Reported on 11/11/2023)   feeding supplement (ENSURE ENLIVE / ENSURE PLUS) LIQD Take 237 mLs by mouth 2 (two) times daily between meals.   gabapentin (NEURONTIN) 100 MG capsule Take 200 mg by mouth 2 (two) times daily.   levothyroxine (SYNTHROID) 100 MCG tablet Take 100 mcg by mouth daily.   losartan (COZAAR) 50 MG tablet Take 50 mg by mouth 2 (two) times daily.   metoprolol succinate (TOPROL XL) 25 MG 24 hr tablet Take 1 tablet (25 mg total) by mouth daily.   mirtazapine (REMERON) 7.5 MG tablet Take 2 tablets (15 mg total) by mouth at bedtime.   OLANZapine (ZYPREXA) 5 MG tablet Take 5 mg by mouth daily.    ondansetron (ZOFRAN) 8 MG tablet Take 8 mg by mouth as needed for nausea or vomiting.   polyethylene glycol (MIRALAX) 17 g packet Take 17 g by mouth daily.   saccharomyces boulardii (FLORASTOR) 250 MG capsule Take 250 mg by mouth 2 (two) times daily.   senna (SENOKOT) 8.6 MG TABS tablet Take 1 tablet by mouth daily. May also take 2 tablets by mouth as needed for constipation   traZODone (DESYREL) 50 MG tablet Take 50 mg by mouth at bedtime as needed for sleep.   No facility-administered encounter medications on file as of 11/28/2023.    Review of Systems  Constitutional:  Negative for chills and fever.  HENT:  Negative for sinus pressure and sore throat.   Respiratory:  Negative for cough, shortness of breath and wheezing.   Cardiovascular:  Negative for chest pain, palpitations and leg swelling.  Gastrointestinal:  Negative for abdominal distention, abdominal pain, blood in stool, constipation, diarrhea, nausea and vomiting.  Genitourinary:  Negative for dysuria, frequency and urgency.  Neurological:  Negative for dizziness, weakness and numbness.  Psychiatric/Behavioral:  Positive for sleep disturbance. Negative for confusion and suicidal ideas. The patient is nervous/anxious.     There were no vitals filed for this visit. There is no height or weight on file to calculate BMI. Physical Exam Constitutional:      Appearance: Normal appearance.  HENT:     Head: Normocephalic and atraumatic.  Cardiovascular:     Rate and Rhythm: Normal rate and regular rhythm.  Pulmonary:     Effort: Pulmonary effort is normal. No respiratory distress.     Breath sounds: Normal breath sounds. No wheezing.  Abdominal:     General: Bowel sounds are normal. There is no distension.     Tenderness: There is no abdominal tenderness. There is no guarding or rebound.     Comments:    Musculoskeletal:        General: No swelling or tenderness.  Neurological:     Mental Status: She is alert. Mental  status is at baseline.     Sensory: No sensory deficit.     Motor: No weakness.     Labs reviewed: Basic Metabolic Panel: Recent Labs    06/13/23 0403 06/14/23 0357 06/15/23 0340 08/07/23 1245 09/10/23 1823 09/23/23 0000 09/29/23 0524 09/30/23 0614 10/01/23 0340  NA 128* 131* 133*   < > 130*   < > 132* 134* 135  K 4.0 4.0 3.8   < > 4.3   < > 3.9 3.6 3.7  CL 99 100 102   < > 96*   < > 104 101 104  CO2 21* 25 25   < > 25   < > 25 23 25   GLUCOSE 92 92 95   < > 91   < > 89 89 108*  BUN 14 12 11    < > 10   < > 18 16 12   CREATININE 1.36* 0.93 0.73   < > 1.02*   < > 1.00 0.79 0.78  CALCIUM 9.2 8.8* 8.7*   < > 10.3   < > 8.7* 9.0 9.0  MG 2.3 2.0 2.0  --  1.9  --   --   --   --   PHOS 3.9 3.3 3.6  --   --   --   --   --   --    < > = values in this interval not displayed.   Liver Function Tests: Recent Labs    08/07/23 1245 09/08/23 1532 09/23/23 0000 09/26/23 2043  AST 21 21 19 29   ALT 15 18 14 23   ALKPHOS 60 76 76 82  BILITOT 1.0 0.7  --  0.7  PROT 6.6 7.3  --  6.6  ALBUMIN 4.0 4.3 3.7 3.6   Recent Labs    08/07/23 1245  LIPASE 36   No results for input(s): "AMMONIA" in the last 8760 hours. CBC: Recent Labs    09/08/23 1532 09/10/23 1823 09/23/23 0000 09/26/23 2043 09/27/23 0607 09/29/23 0524 09/30/23 0614 10/01/23 0340  WBC 5.5   < > 5.3 5.2   < > 9.9 8.5 8.4  NEUTROABS 3.8  --  3,058.00 3.5  --   --   --   --   HGB 11.5*   < > 11.1* 11.8*   < > 9.3* 10.2* 10.8*  HCT 34.4*   < > 33* 36.3   < > 27.6* 30.0* 32.6*  MCV 94.2   < >  --  96.0   < > 92.3 91.7 90.8  PLT 239   < > 223 216   < > 172 192 227   < > = values in this interval not displayed.   Cardiac Enzymes: No results for input(s): "CKTOTAL", "CKMB", "CKMBINDEX", "TROPONINI" in the last 8760 hours. BNP: Invalid input(s): "POCBNP" No results found for: "HGBA1C" Lab Results  Component Value Date   TSH 0.77 09/23/2023   Lab Results  Component Value Date   VITAMINB12 323 10/27/2023   Lab  Results  Component Value Date   FOLATE 12.4 06/13/2023   Lab Results  Component Value Date   IRON 52 10/27/2023  TIBC 258 10/27/2023   FERRITIN 110 10/27/2023    Imaging and Procedures obtained prior to SNF admission: US RENAL Result Date: 09/27/2023 CLINICAL DATA:  Acute kidney injury EXAM: RENAL / URINARY TRACT ULTRASOUND COMPLETE COMPARISON:  CT 08/07/2023 FINDINGS: Right Kidney: Renal measurements: 9.4 x 4.8 x 4.6 cm = volume: 108.5 mL. Echogenicity within normal limits. Mild right hydronephrosis. Left Kidney: Renal measurements: 9.4 x 5.2 x 4 cm = volume: 102.1 mL. Echogenicity within normal limits. No mass or hydronephrosis. Bladder: Catheter in the bladder.  Moderate to marked bladder distension Other: None. IMPRESSION: 1. Mild right hydronephrosis. 2. Moderate to marked bladder distension with catheter in place. Correlate for catheter function Electronically Signed   By: Jasmine Pang M.D.   On: 09/27/2023 23:33   DG Pelvis Portable Result Date: 09/27/2023 CLINICAL DATA:  Postop. EXAM: PORTABLE PELVIS 1-2 VIEWS COMPARISON:  Preoperative imaging FINDINGS: Right hip hemiarthroplasty in expected alignment. No periprosthetic lucency or fracture. Recent postsurgical change includes air and edema in the soft tissues. Lateral skin staples in place. IMPRESSION: Right hip hemiarthroplasty without immediate postoperative complication. Electronically Signed   By: Narda Rutherford M.D.   On: 09/27/2023 17:36   DG HIP UNILAT WITH PELVIS 1V RIGHT Result Date: 09/27/2023 CLINICAL DATA:  [id number] Surgery, elective [id number] EXAM: DG HIP (WITH OR WITHOUT PELVIS) 1V RIGHT COMPARISON:  Pre-Operative imaging FINDINGS: Four fluoroscopic spot views of the pelvis and right hip obtained in the operating room. Images during hip arthroplasty. Fluoroscopy time 14 seconds. Dose 0.89 mGy. IMPRESSION: Intraoperative fluoroscopy during right hip arthroplasty. Electronically Signed   By: Narda Rutherford M.D.   On:  09/27/2023 17:36   DG C-Arm 1-60 Min-No Report Result Date: 09/27/2023 Fluoroscopy was utilized by the requesting physician.  No radiographic interpretation.   DG FEMUR PORT, MIN 2 VIEWS RIGHT Result Date: 09/27/2023 CLINICAL DATA:  Right hip fracture EXAM: RIGHT FEMUR PORTABLE 2 VIEW COMPARISON:  None Available. FINDINGS: Two view radiograph of the right femur again demonstrates an impacted subcapital right femoral neck fracture. No dislocation. Right hip joint space preserved. The distal fracture fragment consisting of the femoral shaft is intact. Limited images of the right knee are unremarkable. IMPRESSION: 1. Impacted subcapital right femoral neck fracture. Distal femur appears intact. Electronically Signed   By: Helyn Numbers M.D.   On: 09/27/2023 00:13   DG CHEST PORT 1 VIEW Result Date: 09/27/2023 CLINICAL DATA:  Right hip fracture. Medical clearance for surgical intervention EXAM: PORTABLE CHEST 1 VIEW COMPARISON:  None Available. FINDINGS: The heart size and mediastinal contours are within normal limits. Both lungs are clear. The visualized skeletal structures are unremarkable. IMPRESSION: No active disease. Electronically Signed   By: Helyn Numbers M.D.   On: 09/27/2023 00:12   DG Hip Unilat W or Wo Pelvis 2-3 Views Right Result Date: 09/26/2023 CLINICAL DATA:  Fall EXAM: DG HIP (WITH OR WITHOUT PELVIS) 2-3V RIGHT COMPARISON:  None Available. FINDINGS: There is an acute, impacted subcapital right femoral neck fracture. The femoral head is still seated within the right acetabulum and right hip joint space is preserved. The pelvis and visualized left hip are intact. Sacroiliac and left hip joint spaces are preserved. Soft tissues are unremarkable. IMPRESSION: 1. Acute, impacted subcapital right femoral neck fracture. Electronically Signed   By: Helyn Numbers M.D.   On: 09/26/2023 22:02    Assessment/Plan  GAD  As per med history pt is receiving xanax 1-2 tab daily  She is currently  on buspar 15mg  bid  Will increase to 15mg  tid Cont with olanzapine  Insomnia Hasn't used trazodone prn  Will stop trazodone Will cont with rameron  Discussed regarding sleep hygiene  Hyponatremia Sodium levels improved BASIC METABOLIC PANEL GLUCOSE 86 mg/dL 16-10 Final  Fasting reference interval UREA NITROGEN (BUN) 19 mg/dL 9-60 Final CREATININE 4.54 mg/dL 0.98-1.19 Final EGFR 63 mL/min/1.73 m2 > OR = 60 Final BUN/CREATININE RATIO SEE NOTE: (calc) 6-22 Final  Not Reported: BUN and Creatinine are within  reference range. SODIUM 139 mmol/L 135-146 Final POTASSIUM 4.0 mmol/L 3.5-5.3 Final CHLORIDE 105 mmol/L 98-110 Final CARBON DIOXIDE 28 mmol/L 20-32 Final CALCIUM 9.2 mg/dL 1.4-78.2 Fina  30 min Total time spent for obtaining history,  performing a medically appropriate examination and evaluation, reviewing the tests, documenting clinical information in the electronic or other health record,  ,care coordination (not separately reported)

## 2023-11-29 ENCOUNTER — Non-Acute Institutional Stay: Payer: Medicare PPO | Admitting: Nurse Practitioner

## 2023-11-29 ENCOUNTER — Encounter: Payer: Self-pay | Admitting: Nurse Practitioner

## 2023-11-29 DIAGNOSIS — R278 Other lack of coordination: Secondary | ICD-10-CM | POA: Diagnosis not present

## 2023-11-29 DIAGNOSIS — R2689 Other abnormalities of gait and mobility: Secondary | ICD-10-CM | POA: Diagnosis not present

## 2023-11-29 DIAGNOSIS — Z0000 Encounter for general adult medical examination without abnormal findings: Secondary | ICD-10-CM | POA: Diagnosis not present

## 2023-11-29 DIAGNOSIS — M6281 Muscle weakness (generalized): Secondary | ICD-10-CM | POA: Diagnosis not present

## 2023-11-29 DIAGNOSIS — R2681 Unsteadiness on feet: Secondary | ICD-10-CM | POA: Diagnosis not present

## 2023-11-29 DIAGNOSIS — R29898 Other symptoms and signs involving the musculoskeletal system: Secondary | ICD-10-CM | POA: Diagnosis not present

## 2023-11-29 DIAGNOSIS — R41841 Cognitive communication deficit: Secondary | ICD-10-CM | POA: Diagnosis not present

## 2023-11-30 NOTE — Progress Notes (Unsigned)
Subjective:   Debbie Wells is a 83 y.o. female who presents for Medicare Annual (Subsequent) preventive examination.  Visit Complete: In person  Patient Medicare AWV questionnaire was completed by the patient on 11/29/23; I have confirmed that all information answered by patient is correct and no changes since this date.  Cardiac Risk Factors include: advanced age (>16men, >41 women);dyslipidemia;hypertension     Objective:    Today's Vitals   11/29/23 1450 11/29/23 1457  BP: (!) 144/66 129/85  Pulse: 72   Temp: (!) 97.4 F (36.3 C)   SpO2: 95%   Weight: 131 lb (59.4 kg)   Height: 5\' 4"  (1.626 m)   PainSc: 0-No pain    Body mass index is 22.49 kg/m.     11/29/2023    3:34 PM 11/11/2023   10:51 AM 10/04/2023   11:49 AM 09/27/2023   12:29 PM 09/26/2023    8:09 PM 09/23/2023    3:26 PM 09/10/2023    5:51 PM  Advanced Directives  Does Patient Have a Medical Advance Directive? No No No No No Yes No  Type of Advance Directive      Out of facility DNR (pink MOST or yellow form)   Does patient want to make changes to medical advance directive? No - Patient declined No - Patient declined No - Patient declined  No - Patient declined No - Patient declined   Would patient like information on creating a medical advance directive?   No - Patient declined No - Patient declined No - Patient declined  No - Patient declined    Current Medications (verified) Outpatient Encounter Medications as of 11/29/2023  Medication Sig   ALPRAZolam (XANAX) 0.5 MG tablet Take 1 tablet (0.5 mg total) by mouth 3 (three) times daily as needed.   aspirin 81 MG chewable tablet Chew 1 tablet (81 mg total) by mouth 2 (two) times daily.   busPIRone (BUSPAR) 15 MG tablet Take 15 mg by mouth 2 (two) times daily. Second listing in [facility] states take three times daily   Docusate Sodium 100 MG capsule Take 100 mg by mouth as needed for constipation.   DULoxetine (CYMBALTA) 30 MG capsule Take 90 mg by  mouth daily.   gabapentin (NEURONTIN) 100 MG capsule Take 200 mg by mouth 2 (two) times daily.   lactose free nutrition (BOOST) LIQD Take 237 mLs by mouth 2 (two) times daily.   levothyroxine (SYNTHROID) 100 MCG tablet Take 100 mcg by mouth daily.   loperamide (IMODIUM A-D) 2 MG tablet Take 2 mg by mouth every 8 (eight) hours as needed for diarrhea or loose stools.   losartan (COZAAR) 50 MG tablet Take 50 mg by mouth 2 (two) times daily.   methenamine (HIPREX) 1 g tablet Take 1 g by mouth 2 (two) times daily.   metoprolol succinate (TOPROL XL) 25 MG 24 hr tablet Take 1 tablet (25 mg total) by mouth daily.   mirtazapine (REMERON) 15 MG tablet Take 15 mg by mouth at bedtime.   OLANZapine (ZYPREXA) 5 MG tablet Take 5 mg by mouth daily.   ondansetron (ZOFRAN) 8 MG tablet Take 8 mg by mouth as needed for nausea or vomiting.   polyethylene glycol (MIRALAX) 17 g packet Take 17 g by mouth daily.   saccharomyces boulardii (FLORASTOR) 250 MG capsule Take 250 mg by mouth 2 (two) times daily.   senna (SENOKOT) 8.6 MG TABS tablet Take 1 tablet by mouth daily. May also take 2 tablets  by mouth as needed for constipation   acetaminophen (TYLENOL) 325 MG tablet Take 650 mg by mouth every 4 (four) hours as needed (for pain). (Patient not taking: Reported on 11/11/2023)   amLODipine (NORVASC) 2.5 MG tablet Take 2.5 mg by mouth daily. (Patient not taking: Reported on 11/11/2023)   DULoxetine (CYMBALTA) 60 MG capsule Take 1 capsule (60 mg total) by mouth daily. Total daily dose 90 mg (Patient not taking: Reported on 11/29/2023)   estradiol (ESTRACE) 0.1 MG/GM vaginal cream Place 1 Applicatorful vaginally 2 (two) times a week. Tuesday and Friday (Patient not taking: Reported on 11/11/2023)   feeding supplement (ENSURE ENLIVE / ENSURE PLUS) LIQD Take 237 mLs by mouth 2 (two) times daily between meals. (Patient not taking: Reported on 11/29/2023)   mirtazapine (REMERON) 7.5 MG tablet Take 2 tablets (15 mg total) by mouth at  bedtime. (Patient not taking: Reported on 11/29/2023)   PREMARIN vaginal cream Place 1 applicator vaginally as directed. (Patient not taking: Reported on 11/29/2023)   traZODone (DESYREL) 50 MG tablet Take 50 mg by mouth at bedtime as needed for sleep. (Patient not taking: Reported on 11/29/2023)   No facility-administered encounter medications on file as of 11/29/2023.    Allergies (verified) Hydrocodone-acetaminophen, Iodinated contrast media, Sulfa antibiotics, Codeine, Sulfonamide derivatives, Terbinafine, and Benazepril   History: Past Medical History:  Diagnosis Date   Adenomatous colon polyp    Adenomatous polyp of colon 12/03/2004   6mm   Anxiety    Chronic headaches    Chronic insomnia    Depression    Diverticulosis    External hemorrhoids    Hyperlipidemia    Hypertension    Hyperthyroidism    Hypothyroidism    Umbilical hernia    Past Surgical History:  Procedure Laterality Date   ABDOMINAL HYSTERECTOMY  1975   APPENDECTOMY  1975   COLONOSCOPY  03/03/2010,12/03/2004   ESOPHAGOGASTRODUODENOSCOPY  12/16/11   Dr. Stan Head   HIP ARTHROPLASTY Right 09/27/2023   Procedure: ARTHROPLASTY BIPOLAR HIP (HEMIARTHROPLASTY);  Surgeon: Kathryne Hitch, MD;  [facility];  Service: Orthopedics;  Laterality: Right;   OOPHORECTOMY     spinal lipoma removed  2006   TOTAL ABDOMINAL HYSTERECTOMY     Family History  Problem Relation Age of Onset   Esophageal cancer Father    Stomach cancer Father        mets from esophagus   Heart disease Mother    Irritable bowel syndrome Mother    Kidney disease Mother    Other Mother        cushings disease   Addison's disease Mother    Heart failure Mother    Stroke Mother    Hypertension Mother    Stroke Maternal Grandmother    Hypertension Maternal Grandmother    Colon cancer Neg Hx    Heart attack Neg Hx    Social History   Socioeconomic History   Marital status: Married    Spouse name: Not on file   Number of  children: 2   Years of education: Not on file   Highest education level: Not on file  Occupational History   Occupation: retired  Tobacco Use   Smoking status: Former    Current packs/day: 0.00    Types: Cigarettes    Quit date: 01/23/1993    Years since quitting: 30.8   Smokeless tobacco: Never  Vaping Use   Vaping status: Never Used  Substance and Sexual Activity   Alcohol use: Yes  Alcohol/week: 2.0 standard drinks of alcohol    Types: 2 Glasses of wine per week   Drug use: No   Sexual activity: Not on file  Other Topics Concern   Not on file  Social History Narrative   Patient is married she is retired and has 2 children   About 2 alcoholic beverages a week no drugs, no current tobacco she is a former smoker      Lives at home with her husband   Right handed   Social Drivers of Health   Financial Resource Strain: Low Risk  (10/03/2020)   Received from [facility], [facility]   Overall Financial Resource Strain (CARDIA)    Difficulty of Paying Living Expenses: Not hard at all  Food Insecurity: No Food Insecurity (09/27/2023)   Hunger Vital Sign    Worried About Running Out of Food in the Last Year: Never true    Ran Out of Food in the Last Year: Never true  Transportation Needs: No Transportation Needs (09/27/2023)   PRAPARE - Administrator, Civil Service (Medical): No    Lack of Transportation (Non-Medical): No  Physical Activity: Not on file  Stress: Stress Concern Present (10/03/2020)   Received from [facility], [facility] - Occupational Stress Questionnaire    Feeling of Stress : Rather much  Social Connections: Unknown (03/16/2022)   Received [facility], [facility]   Social Network    Social Network: Not on file    Tobacco Counseling Counseling given: Not Answered   Clinical Intake:  Pre-visit preparation completed: Yes  Pain : No/denies pain Pain Score: 0-No pain      BMI - recorded: 22.49 Nutritional Status: BMI of 19-24  Normal Nutritional Risks: None Diabetes: No  How often do you need to have someone help you when you read instructions, pamphlets, or other written materials from your doctor or pharmacy?: 4 - Often What is the last grade level you completed in school?: college  Interpreter Needed?: No  Information entered by :: Cliffie Gingras Nedra Hai NP   Activities of Daily Living    11/30/2023    2:36 PM 10/04/2023   11:38 AM  In your present state of health, do you have any difficulty performing the following activities:  Hearing? 0   Vision? 0   Difficulty concentrating or making decisions? 1   Walking or climbing stairs? 1   Dressing or bathing? 1   Doing errands, shopping? 1 0  Preparing Food and eating ? N   Using the Toilet? N   In the past six months, have you accidently leaked urine? Y   Do you have problems with loss of bowel control? N   Managing your Medications? Y   Managing your Finances? Y   Housekeeping or managing your Housekeeping? Y     Patient Care Team: Mahlon Gammon, MD as PCP - General (Internal Medicine) Jens Som Madolyn Frieze, MD as PCP - Cardiology (Cardiology) Duke, Roe Rutherford, PA as Physician Assistant (Cardiology)  Indicate any recent Medical Services you may have received from other than [facility] providers in the past year (date may be approximate).     Assessment:   This is a routine wellness examination for [place].  Hearing/Vision screen No results found.   Goals Addressed             This Visit's Progress    Maintain Mobility and Function  Evidence-based guidance:  Emphasize the importance of physical activity and aerobic exercise as included in treatment plan; assess barriers to adherence; consider patient's abilities and preferences.  Encourage gradual increase in activity or exercise instead of stopping if pain occurs.  Reinforce individual therapy exercise prescription, such as  strengthening, stabilization and stretching programs.  Promote optimal body mechanics to stabilize the spine with lifting and functional activity.  Encourage activity and mobility modifications to facilitate optimal function, such as using a log roll for bed mobility or dressing from a seated position.  Reinforce individual adaptive equipment recommendations to limit excessive spinal movements, such as a Event organiser.  Assess adequacy of sleep; encourage use of sleep hygiene techniques, such as bedtime routine; use of white noise; dark, cool bedroom; avoiding daytime naps, heavy meals or exercise before bedtime.  Promote positions and modification to optimize sleep and sexual activity; consider pillows or positioning devices to assist in maintaining neutral spine.  Explore options for applying ergonomic principles at work and home, such as frequent position changes, using ergonomically designed equipment and working at optimal height.  Promote modifications to increase comfort with driving such as lumbar support, optimizing seat and steering wheel position, using cruise control and taking frequent rest stops to stretch and walk.   Notes:      Maintain Mobility and Function       Evidence-based guidance:  Acknowledge and validate impact of pain, loss of strength and potential disfigurement (hand osteoarthritis) on mental health and daily life, such as social isolation, anxiety, depression, impaired sexual relationship and   injury from falls.  Anticipate referral to physical or occupational therapy for assessment, therapeutic exercise and recommendation for adaptive equipment or assistive devices; encourage participation.  Assess impact on ability to perform activities of daily living, as well as engage in sports and leisure events or requirements of work or school.  Provide anticipatory guidance and reassurance about the benefit of exercise to maintain function; acknowledge and normalize fear  that exercise may worsen symptoms.  Encourage regular exercise, at least 10 minutes at a time for 45 minutes per week; consider yoga, water exercise and proprioceptive exercises; encourage use of wearable activity tracker to increase motivation and adherence.  Encourage maintenance or resumption of daily activities, including employment, as pain allows and with minimal exposure to trauma.  Assist patient to advocate for adaptations to the work environment.  Consider level of pain and function, gender, age, lifestyle, patient preference, quality of life, readiness and ?ocapacity to benefit? when recommending patients for orthopaedic surgery consultation.  Explore strategies, such as changes to medication regimen or activity that enables patient to anticipate and manage flare-ups that increase deconditioning and disability.  Explore patient preferences; encourage exposure to a broader range of activities that have been avoided for fear of experiencing pain.  Identify barriers to participation in therapy or exercise, such as pain with activity, anticipated or imagined pain.  Monitor postoperative joint replacement or any preexisting joint replacement for ongoing pain and loss of function; provide social support and encouragement throughout recovery.   Notes:        Depression Screen    11/30/2023    2:38 PM 03/17/2023    3:02 PM  PHQ 2/9 Scores  PHQ - 2 Score 0 0    Fall Risk    03/17/2023    3:02 PM 04/30/2019   10:20 AM  Fall Risk   Falls in the past year? 0 0  Number falls in past yr: 0  Injury with Fall? 0   Risk for fall due to : No Fall Risks   Follow up Falls evaluation completed     MEDICARE RISK AT HOME: Medicare Risk at Home Any stairs in or around the home?: Yes If so, are there any without handrails?: No Home free of loose throw rugs in walkways, pet beds, electrical cords, etc?: Yes Adequate lighting in your home to reduce risk of falls?: Yes Life alert?: No Use of  a cane, walker or w/c?: Yes Grab bars in the bathroom?: Yes Shower chair or bench in shower?: Yes Elevated toilet seat or a handicapped toilet?: Yes  TIMED UP AND GO:  Was the test performed?  Yes  Length of time to ambulate 10 feet: 8 sec Gait slow and steady with assistive device    Cognitive Function:    12/22/2020    1:33 PM  MMSE - Mini Mental State Exam  Orientation to time   Orientation to Place   Registration   Attention/ Calculation   Recall   Language- name 2 objects   Language- repeat   Language- follow 3 step command   Language- read & follow direction   Write a sentence   Copy design   Total score      Information is confidential and restricted. Go to Review Flowsheets to unlock data.        Immunizations Immunization History  Administered Date(s) Administered   Influenza, High Dose Seasonal PF 08/04/2017, 07/27/2018, 07/27/2020   Influenza,inj,Quad PF,6-35 Mos 12/26/2019   PFIZER Comirnaty(Gray Top)Covid-19 Tri-Sucrose Vaccine 04/22/2021   PFIZER(Purple Top)SARS-COV-2 Vaccination 11/17/2019, 12/06/2019   PPD Test 10/13/2020   Pfizer Covid-19 Vaccine Bivalent Booster 40yrs & up 08/27/2021   Tdap 02/15/2022   Zoster Recombinant(Shingrix) 01/18/2018, 03/21/2018, 04/20/2018    TDAP status: Up to date  Flu Vaccine status: Due, Education has been provided regarding the importance of this vaccine. Advised may receive this vaccine at local pharmacy or Health Dept. Aware to provide a copy of the vaccination record if obtained from local pharmacy or Health Dept. Verbalized acceptance and understanding.  Pneumococcal vaccine status: Due, Education has been provided regarding the importance of this vaccine. Advised may receive this vaccine at local pharmacy or Health Dept. Aware to provide a copy of the vaccination record if obtained from local pharmacy or Health Dept. Verbalized acceptance and understanding.  Covid-19 vaccine status: Information provided on how  to obtain vaccines.   Qualifies for Shingles Vaccine? Yes   Zostavax completed Yes   Shingrix Completed?: Yes  Screening Tests Health Maintenance  Topic Date Due   Pneumonia Vaccine 22+ Years old (1 of 2 - PCV) Never done   DEXA SCAN  Never done   INFLUENZA VACCINE  06/02/2023   COVID-19 Vaccine (5 - 2024-25 season) 07/03/2023   Medicare Annual Wellness (AWV)  11/28/2024   DTaP/Tdap/Td (2 - Td or Tdap) 02/16/2032   Zoster Vaccines- Shingrix  Completed   HPV VACCINES  Aged Out   Hepatitis C Screening  Discontinued    Health Maintenance  Health Maintenance Due  Topic Date Due   Pneumonia Vaccine 6+ Years old (1 of 2 - PCV) Never done   DEXA SCAN  Never done   INFLUENZA VACCINE  06/02/2023   COVID-19 Vaccine (5 - 2024-25 season) 07/03/2023    Colorectal cancer screening: No longer required.   Mammogram status: No longer required due to aged out.  Bone Density status: Ordered 11/30/23 if aggrees. Pt provided with contact info and  advised to call to schedule appt.  Lung Cancer Screening: (Low Dose CT Chest recommended if Age 23-80 years, 20 pack-year currently smoking OR have quit w/in 15years.) does not qualify.   Lung Cancer Screening Referral: NA  Additional Screening:  Hepatitis C Screening: does not qualify;   Vision Screening: Recommended annual ophthalmology exams for early detection of glaucoma and other disorders of the eye. Is the patient up to date with their annual eye exam?  No  Who is the provider or what is the name of the office in which the patient attends annual eye exams? [facility] will provide if needed.  If pt is not established with a provider, would they like to be referred to a provider to establish care? No .   Dental Screening: Recommended annual dental exams for proper oral hygiene  Diabetic Foot Exam: NA  Community Resource Referral / Chronic Care Management: CRR required this visit?  No   CCM required this visit?  No     Plan:     I  have personally reviewed and noted the following in the patient's chart:   Medical and social history Use of alcohol, tobacco or illicit drugs  Current medications and supplements including opioid prescriptions. Patient is not currently taking opioid prescriptions. Functional ability and status Nutritional status Physical activity Advanced directives List of other physicians Hospitalizations, surgeries, and ER visits in previous 12 months Vitals Screenings to include cognitive, depression, and falls Referrals and appointments  In addition, I have reviewed and discussed with patient certain preventive protocols, quality metrics, and best practice recommendations. A written personalized care plan for preventive services as well as general preventive health recommendations were provided to patient.     Sharda Keddy X Amerie Beaumont, NP   12/01/2023   After Visit Summary: (In Person-Declined) Patient declined AVS at this time.

## 2023-12-01 ENCOUNTER — Encounter: Payer: Self-pay | Admitting: Nurse Practitioner

## 2023-12-01 DIAGNOSIS — R2681 Unsteadiness on feet: Secondary | ICD-10-CM | POA: Diagnosis not present

## 2023-12-01 DIAGNOSIS — M6281 Muscle weakness (generalized): Secondary | ICD-10-CM | POA: Diagnosis not present

## 2023-12-01 DIAGNOSIS — R29898 Other symptoms and signs involving the musculoskeletal system: Secondary | ICD-10-CM | POA: Diagnosis not present

## 2023-12-01 DIAGNOSIS — R2689 Other abnormalities of gait and mobility: Secondary | ICD-10-CM | POA: Diagnosis not present

## 2023-12-01 DIAGNOSIS — R278 Other lack of coordination: Secondary | ICD-10-CM | POA: Diagnosis not present

## 2023-12-01 DIAGNOSIS — R41841 Cognitive communication deficit: Secondary | ICD-10-CM | POA: Diagnosis not present

## 2023-12-05 ENCOUNTER — Non-Acute Institutional Stay (SKILLED_NURSING_FACILITY): Payer: Self-pay | Admitting: Sports Medicine

## 2023-12-05 ENCOUNTER — Encounter: Payer: Self-pay | Admitting: Sports Medicine

## 2023-12-05 DIAGNOSIS — I10 Essential (primary) hypertension: Secondary | ICD-10-CM | POA: Diagnosis not present

## 2023-12-05 DIAGNOSIS — E871 Hypo-osmolality and hyponatremia: Secondary | ICD-10-CM

## 2023-12-05 DIAGNOSIS — M25473 Effusion, unspecified ankle: Secondary | ICD-10-CM

## 2023-12-05 NOTE — Progress Notes (Signed)
Provider:  Venita Sheffield MD Location:   [facility]   Place of Service:   Skilled nursing facility  PCP: Mahlon Gammon, MD Patient Care Team: Mahlon Gammon, MD as PCP - General (Internal Medicine) Jens Som Madolyn Frieze, MD as PCP - Cardiology (Cardiology) Duke, Roe Rutherford, PA as Physician Assistant (Cardiology)  Extended Emergency Contact Information Primary Emergency Contact: [facility] Phone: [phone] Relation: Son  Goals of Care: Advanced Directive information    11/29/2023    3:34 PM  Advanced Directives  Does Patient Have a Medical Advance Directive? No  Does patient want to make changes to medical advance directive? No - Patient declined      No chief complaint on file.   HPI: Patient is a 83 y.o. female seen today for acute visit for high blood pressure reading. Patient seen and examined in her room.  Reports that her blood pressures been running high lately. She reports feeling shaky and requested nurse for Xanax. Patient complains of intermittent headaches but currently denies any headaches. She had her breakfast this morning.  Denies nausea, vomiting, chest pain, palpitations, shortness of breath, abdominal pain. Patient denies blurry, double vision. She ambulates with a rollator walker. Denies eating salty or fried foods. She is currently on losartan 100 mg daily, Toprol 50 mg daily  Bilateral ankle swelling-patient complains of ankle swelling. Denies orthopnea, PND  History of hyponatremia-last sodium levels improved to 139. She is currently off the sodium tablets.   Past Medical History:  Diagnosis Date   Adenomatous colon polyp    Adenomatous polyp of colon 12/03/2004   6mm   Anxiety    Chronic headaches    Chronic insomnia    Depression    Diverticulosis    External hemorrhoids    Hyperlipidemia    Hypertension    Hyperthyroidism    Hypothyroidism    Umbilical hernia    Past Surgical History:  Procedure  Laterality Date   ABDOMINAL HYSTERECTOMY  1975   APPENDECTOMY  1975   COLONOSCOPY  03/03/2010,12/03/2004   ESOPHAGOGASTRODUODENOSCOPY  12/16/11   Dr. Stan Head   HIP ARTHROPLASTY Right 09/27/2023   Procedure: ARTHROPLASTY BIPOLAR HIP (HEMIARTHROPLASTY);  Surgeon: Kathryne Hitch, MD;  Location: [facility] OR;  Service: Orthopedics;  Laterality: Right;   OOPHORECTOMY     spinal lipoma removed  2006   TOTAL ABDOMINAL HYSTERECTOMY      reports that she quit smoking about 30 years ago. Her smoking use included cigarettes. She has never used smokeless tobacco. She reports current alcohol use of about 2.0 standard drinks of alcohol per week. She reports that she does not use drugs. Social History   Socioeconomic History   Marital status: Married    Spouse name: Not on file   Number of children: 2   Years of education: Not on file   Highest education level: Not on file  Occupational History   Occupation: retired  Tobacco Use   Smoking status: Former    Current packs/day: 0.00    Types: Cigarettes    Quit date: 01/23/1993    Years since quitting: 30.8   Smokeless tobacco: Never  Vaping Use   Vaping status: Never Used  Substance and Sexual Activity   Alcohol use: Yes    Alcohol/week: 2.0 standard drinks of alcohol    Types: 2 Glasses of wine per week   Drug use: No   Sexual activity: Not on file  Other Topics Concern   Not on file  Social  History Narrative   Patient is married she is retired and has 2 children   About 2 alcoholic beverages a week no drugs, no current tobacco she is a former smoker      Lives at home with her husband   Right handed   Social Drivers of Health   Financial Resource Strain: Low Risk  (10/03/2020)   Received from [facility], [facility]   Overall Financial Resource Strain (CARDIA)    Difficulty of Paying Living Expenses: Not hard at all  Food Insecurity: No Food Insecurity (09/27/2023)   Hunger Vital Sign    Worried About Running Out of Food  in the Last Year: Never true    Ran Out of Food in the Last Year: Never true  Transportation Needs: No Transportation Needs (09/27/2023)   PRAPARE - Administrator, Civil Service (Medical): No    Lack of Transportation (Non-Medical): No  Physical Activity: Not on file  Stress: Stress Concern Present (10/03/2020)   Received from [facility], [facility] of Occupational Health - Occupational Stress Questionnaire    Feeling of Stress : Rather much  Social Connections: Unknown (03/16/2022)   Received from [facility], [facility]   Social Network    Social Network: Not on file  Intimate Partner Violence: Not At Risk (09/27/2023)   Humiliation, Afraid, Rape, and Kick questionnaire    Fear of Current or Ex-Partner: No    Emotionally Abused: No    Physically Abused: No    Sexually Abused: No    Functional Status Survey:    Family History  Problem Relation Age of Onset   Esophageal cancer Father    Stomach cancer Father        mets from esophagus   Heart disease Mother    Irritable bowel syndrome Mother    Kidney disease Mother    Other Mother        cushings disease   Addison's disease Mother    Heart failure Mother    Stroke Mother    Hypertension Mother    Stroke Maternal Grandmother    Hypertension Maternal Grandmother    Colon cancer Neg Hx    Heart attack Neg Hx     Health Maintenance  Topic Date Due   Pneumonia Vaccine 29+ Years old (1 of 2 - PCV) Never done   DEXA SCAN  Never done   INFLUENZA VACCINE  06/02/2023   COVID-19 Vaccine (5 - 2024-25 season) 07/03/2023   Medicare Annual Wellness (AWV)  11/28/2024   DTaP/Tdap/Td (2 - Td or Tdap) 02/16/2032   Zoster Vaccines- Shingrix  Completed   HPV VACCINES  Aged Out   Hepatitis C Screening  Discontinued    Allergies  Allergen Reactions   Hydrocodone-Acetaminophen Rash    Vicodin=REACTION: rash  Not listed on the MAR   Iodinated Contrast Media Hives and Rash    Per  patient she has developed hives and itching in upper trunk of body after last 2 CT scans once she got homes that she has just treated at home with benadryl. She has not told anyone until now. States she was able to bear it  Other reaction(s): Rash Per patient she has developed hives and itching in upper trunk of body after last 2 CT scans once she got homes that she has just treated at home with benadryl. She has not told anyone until now. States she was able to bear it   Not listed on  the MAR    Sulfa Antibiotics Rash    REACTION: rash Other reaction(s): Unknown   Codeine     Unknown reaction  Not listed on the MAR    Sulfonamide Derivatives     REACTION: rash   Terbinafine     Unknown reaction  Not listed on the [facility]    Benazepril Cough    Not listed on the [facility]     Outpatient Encounter Medications as of 12/05/2023  Medication Sig   acetaminophen (TYLENOL) 325 MG tablet Take 650 mg by mouth every 4 (four) hours as needed (for pain). (Patient not taking: Reported on 11/11/2023)   ALPRAZolam (XANAX) 0.5 MG tablet Take 1 tablet (0.5 mg total) by mouth 3 (three) times daily as needed.   amLODipine (NORVASC) 2.5 MG tablet Take 2.5 mg by mouth daily. (Patient not taking: Reported on 11/11/2023)   aspirin 81 MG chewable tablet Chew 1 tablet (81 mg total) by mouth 2 (two) times daily.   busPIRone (BUSPAR) 15 MG tablet Take 15 mg by mouth 2 (two) times daily. Second listing in [facility] states take three times daily   Docusate Sodium 100 MG capsule Take 100 mg by mouth as needed for constipation.   DULoxetine (CYMBALTA) 30 MG capsule Take 90 mg by mouth daily.   DULoxetine (CYMBALTA) 60 MG capsule Take 1 capsule (60 mg total) by mouth daily. Total daily dose 90 mg (Patient not taking: Reported on 11/29/2023)   estradiol (ESTRACE) 0.1 MG/GM vaginal cream Place 1 Applicatorful vaginally 2 (two) times a week. Tuesday and Friday (Patient not taking: Reported on 11/11/2023)   feeding  supplement (ENSURE ENLIVE / ENSURE PLUS) LIQD Take 237 mLs by mouth 2 (two) times daily between meals. (Patient not taking: Reported on 11/29/2023)   gabapentin (NEURONTIN) 100 MG capsule Take 200 mg by mouth 2 (two) times daily.   lactose free nutrition (BOOST) LIQD Take 237 mLs by mouth 2 (two) times daily.   levothyroxine (SYNTHROID) 100 MCG tablet Take 100 mcg by mouth daily.   loperamide (IMODIUM A-D) 2 MG tablet Take 2 mg by mouth every 8 (eight) hours as needed for diarrhea or loose stools.   losartan (COZAAR) 50 MG tablet Take 50 mg by mouth 2 (two) times daily.   methenamine (HIPREX) 1 g tablet Take 1 g by mouth 2 (two) times daily.   metoprolol succinate (TOPROL XL) 25 MG 24 hr tablet Take 1 tablet (25 mg total) by mouth daily.   mirtazapine (REMERON) 15 MG tablet Take 15 mg by mouth at bedtime.   mirtazapine (REMERON) 7.5 MG tablet Take 2 tablets (15 mg total) by mouth at bedtime. (Patient not taking: Reported on 11/29/2023)   OLANZapine (ZYPREXA) 5 MG tablet Take 5 mg by mouth daily.   ondansetron (ZOFRAN) 8 MG tablet Take 8 mg by mouth as needed for nausea or vomiting.   polyethylene glycol (MIRALAX) 17 g packet Take 17 g by mouth daily.   PREMARIN vaginal cream Place 1 applicator vaginally as directed. (Patient not taking: Reported on 11/29/2023)   saccharomyces boulardii (FLORASTOR) 250 MG capsule Take 250 mg by mouth 2 (two) times daily.   senna (SENOKOT) 8.6 MG TABS tablet Take 1 tablet by mouth daily. May also take 2 tablets by mouth as needed for constipation   traZODone (DESYREL) 50 MG tablet Take 50 mg by mouth at bedtime as needed for sleep. (Patient not taking: Reported on 11/29/2023)   No facility-administered encounter medications on file as  of 12/05/2023.    Review of Systems  Constitutional:  Negative for fever.  HENT:  Negative for sinus pressure and sore throat.   Respiratory:  Negative for cough, shortness of breath and wheezing.   Cardiovascular:  Negative for chest  pain, palpitations and leg swelling.  Gastrointestinal:  Negative for abdominal distention, abdominal pain, blood in stool, constipation, diarrhea, nausea and vomiting.  Genitourinary:  Negative for dysuria, frequency and urgency.  Neurological:  Positive for headaches. Negative for dizziness, weakness and numbness.  Psychiatric/Behavioral:  Negative for confusion. The patient is nervous/anxious.    Negative unless indicated in HPI.  There were no vitals filed for this visit. There is no height or weight on file to calculate BMI. BP Readings from Last 3 Encounters:  11/29/23 129/85  11/28/23 129/85  11/28/23 129/85   Wt Readings from Last 3 Encounters:  11/29/23 131 lb (59.4 kg)  11/25/23 131 lb 1.6 oz (59.5 kg)  11/18/23 131 lb 1.6 oz (59.5 kg)   Physical Exam Constitutional:      Appearance: Normal appearance.  HENT:     Head: Normocephalic and atraumatic.  Cardiovascular:     Rate and Rhythm: Normal rate and regular rhythm.  Pulmonary:     Effort: Pulmonary effort is normal. No respiratory distress.     Breath sounds: Normal breath sounds. No wheezing.  Abdominal:     General: Bowel sounds are normal. There is no distension.     Tenderness: There is no abdominal tenderness. There is no guarding or rebound.     Comments:    Musculoskeletal:        General: Swelling present. No tenderness.     Comments: Bil ankle swelling  Neurological:     Mental Status: She is alert. Mental status is at baseline.     Sensory: No sensory deficit.     Motor: No weakness.     Comments: Gait normal Finger nose neg Strength and sensations intact      Labs reviewed: Basic Metabolic Panel: Recent Labs    06/13/23 0403 06/14/23 0357 06/15/23 0340 08/07/23 1245 09/10/23 1823 09/23/23 0000 09/29/23 0524 09/30/23 0614 10/01/23 0340 11/17/23 0000 11/21/23 0000  NA 128* 131* 133*   < > 130*   < > 132* 134* 135 138 139  K 4.0 4.0 3.8   < > 4.3   < > 3.9 3.6 3.7 4.0 4.0  CL 99 100  102   < > 96*   < > 104 101 104 106 105  CO2 21* 25 25   < > 25   < > 25 23 25  24* 28*  GLUCOSE 92 92 95   < > 91   < > 89 89 108*  --   --   BUN 14 12 11    < > 10   < > 18 16 12 15 19   CREATININE 1.36* 0.93 0.73   < > 1.02*   < > 1.00 0.79 0.78 0.7 0.9  CALCIUM 9.2 8.8* 8.7*   < > 10.3   < > 8.7* 9.0 9.0 9.3 9.2  MG 2.3 2.0 2.0  --  1.9  --   --   --   --   --   --   PHOS 3.9 3.3 3.6  --   --   --   --   --   --   --   --    < > = values in this interval not displayed.  Liver Function Tests: Recent Labs    08/07/23 1245 09/08/23 1532 09/23/23 0000 09/26/23 2043  AST 21 21 19 29   ALT 15 18 14 23   ALKPHOS 60 76 76 82  BILITOT 1.0 0.7  --  0.7  PROT 6.6 7.3  --  6.6  ALBUMIN 4.0 4.3 3.7 3.6   Recent Labs    08/07/23 1245  LIPASE 36   No results for input(s): "AMMONIA" in the last 8760 hours. CBC: Recent Labs    09/08/23 1532 09/10/23 1823 09/23/23 0000 09/26/23 2043 09/27/23 0607 09/29/23 0524 09/30/23 0614 10/01/23 0340  WBC 5.5   < > 5.3 5.2   < > 9.9 8.5 8.4  NEUTROABS 3.8  --  3,058.00 3.5  --   --   --   --   HGB 11.5*   < > 11.1* 11.8*   < > 9.3* 10.2* 10.8*  HCT 34.4*   < > 33* 36.3   < > 27.6* 30.0* 32.6*  MCV 94.2   < >  --  96.0   < > 92.3 91.7 90.8  PLT 239   < > 223 216   < > 172 192 227   < > = values in this interval not displayed.   Cardiac Enzymes: No results for input(s): "CKTOTAL", "CKMB", "CKMBINDEX", "TROPONINI" in the last 8760 hours. BNP: Invalid input(s): "POCBNP" No results found for: "HGBA1C" Lab Results  Component Value Date   TSH 0.77 09/23/2023   Lab Results  Component Value Date   VITAMINB12 323 10/27/2023   Lab Results  Component Value Date   FOLATE 12.4 06/13/2023   Lab Results  Component Value Date   IRON 52 10/27/2023   TIBC 258 10/27/2023   FERRITIN 110 10/27/2023    Imaging and Procedures obtained prior to SNF admission: US RENAL Result Date: 09/27/2023 CLINICAL DATA:  Acute kidney injury EXAM: RENAL /  URINARY TRACT ULTRASOUND COMPLETE COMPARISON:  CT 08/07/2023 FINDINGS: Right Kidney: Renal measurements: 9.4 x 4.8 x 4.6 cm = volume: 108.5 mL. Echogenicity within normal limits. Mild right hydronephrosis. Left Kidney: Renal measurements: 9.4 x 5.2 x 4 cm = volume: 102.1 mL. Echogenicity within normal limits. No mass or hydronephrosis. Bladder: Catheter in the bladder.  Moderate to marked bladder distension Other: None. IMPRESSION: 1. Mild right hydronephrosis. 2. Moderate to marked bladder distension with catheter in place. Correlate for catheter function Electronically Signed   By: Jasmine Pang M.D.   On: 09/27/2023 23:33   DG Pelvis Portable Result Date: 09/27/2023 CLINICAL DATA:  Postop. EXAM: PORTABLE PELVIS 1-2 VIEWS COMPARISON:  Preoperative imaging FINDINGS: Right hip hemiarthroplasty in expected alignment. No periprosthetic lucency or fracture. Recent postsurgical change includes air and edema in the soft tissues. Lateral skin staples in place. IMPRESSION: Right hip hemiarthroplasty without immediate postoperative complication. Electronically Signed   By: Narda Rutherford M.D.   On: 09/27/2023 17:36   DG HIP UNILAT WITH PELVIS 1V RIGHT Result Date: 09/27/2023 CLINICAL DATA:  [id number] Surgery, elective [id number] EXAM: DG HIP (WITH OR WITHOUT PELVIS) 1V RIGHT COMPARISON:  Pre-Operative imaging FINDINGS: Four fluoroscopic spot views of the pelvis and right hip obtained in the operating room. Images during hip arthroplasty. Fluoroscopy time 14 seconds. Dose 0.89 mGy. IMPRESSION: Intraoperative fluoroscopy during right hip arthroplasty. Electronically Signed   By: Narda Rutherford M.D.   On: 09/27/2023 17:36   DG C-Arm 1-60 Min-No Report Result Date: 09/27/2023 Fluoroscopy was utilized by the requesting physician.  No radiographic interpretation.   DG  FEMUR PORT, MIN 2 VIEWS RIGHT Result Date: 09/27/2023 CLINICAL DATA:  Right hip fracture EXAM: RIGHT FEMUR PORTABLE 2 VIEW COMPARISON:  None Available.  FINDINGS: Two view radiograph of the right femur again demonstrates an impacted subcapital right femoral neck fracture. No dislocation. Right hip joint space preserved. The distal fracture fragment consisting of the femoral shaft is intact. Limited images of the right knee are unremarkable. IMPRESSION: 1. Impacted subcapital right femoral neck fracture. Distal femur appears intact. Electronically Signed   By: Helyn Numbers M.D.   On: 09/27/2023 00:13   DG CHEST PORT 1 VIEW Result Date: 09/27/2023 CLINICAL DATA:  Right hip fracture. Medical clearance for surgical intervention EXAM: PORTABLE CHEST 1 VIEW COMPARISON:  None Available. FINDINGS: The heart size and mediastinal contours are within normal limits. Both lungs are clear. The visualized skeletal structures are unremarkable. IMPRESSION: No active disease. Electronically Signed   By: Helyn Numbers M.D.   On: 09/27/2023 00:12   DG Hip Unilat W or Wo Pelvis 2-3 Views Right Result Date: 09/26/2023 CLINICAL DATA:  Fall EXAM: DG HIP (WITH OR WITHOUT PELVIS) 2-3V RIGHT COMPARISON:  None Available. FINDINGS: There is an acute, impacted subcapital right femoral neck fracture. The femoral head is still seated within the right acetabulum and right hip joint space is preserved. The pelvis and visualized left hip are intact. Sacroiliac and left hip joint spaces are preserved. Soft tissues are unremarkable. IMPRESSION: 1. Acute, impacted subcapital right femoral neck fracture. Electronically Signed   By: Helyn Numbers M.D.   On: 09/26/2023 22:02    Assessment/Plan  1. Primary hypertension (Primary) Bp running high  Will start hydrochlorothiazide  Cont with losartan, toprol Monitor bp, Avoid salty foods   2. Ankle swelling, unspecified laterality Elevate feet  Use compression stockings Will start hydrochlorothiazide  Will check bmp, bnp  3. Hyponatremia Will check bmp, next week     30 min Total time spent for obtaining history,  performing a  medically appropriate examination and evaluation, reviewing the tests,ordering  tests,  documenting clinical information in the electronic or other health record,   ,care coordination (not separately reported)

## 2023-12-06 ENCOUNTER — Other Ambulatory Visit: Payer: Self-pay | Admitting: Nurse Practitioner

## 2023-12-06 DIAGNOSIS — Z1231 Encounter for screening mammogram for malignant neoplasm of breast: Secondary | ICD-10-CM

## 2023-12-07 DIAGNOSIS — H52203 Unspecified astigmatism, bilateral: Secondary | ICD-10-CM | POA: Diagnosis not present

## 2023-12-07 DIAGNOSIS — Z961 Presence of intraocular lens: Secondary | ICD-10-CM | POA: Diagnosis not present

## 2023-12-08 ENCOUNTER — Inpatient Hospital Stay: Admission: RE | Admit: 2023-12-08 | Payer: Medicare PPO | Source: Ambulatory Visit

## 2023-12-12 ENCOUNTER — Non-Acute Institutional Stay (SKILLED_NURSING_FACILITY): Payer: Self-pay | Admitting: Sports Medicine

## 2023-12-12 ENCOUNTER — Encounter: Payer: Self-pay | Admitting: Sports Medicine

## 2023-12-12 DIAGNOSIS — F411 Generalized anxiety disorder: Secondary | ICD-10-CM | POA: Diagnosis not present

## 2023-12-12 DIAGNOSIS — E871 Hypo-osmolality and hyponatremia: Secondary | ICD-10-CM

## 2023-12-12 DIAGNOSIS — I10 Essential (primary) hypertension: Secondary | ICD-10-CM

## 2023-12-12 NOTE — Progress Notes (Signed)
 Provider:  Dr. Tye Gall Location:  [facility] Place of Service:   skilled care   PCP: Marguerite Shiley, MD Patient Care Team: Marguerite Shiley, MD as PCP - General (Internal Medicine) Audery Blazing Deannie Fabian, MD as PCP - Cardiology (Cardiology) Duke, Warren Haber, PA as Physician Assistant (Cardiology)  Extended Emergency Contact Information Primary Emergency Contact: [facility] Phone: [phone] Relation: Son  Goals of Care: Advanced Directive information    11/29/2023    3:34 PM  Advanced Directives  Does Patient Have a Medical Advance Directive? No  Does patient want to make changes to medical advance directive? No - Patient declined      No chief complaint on file.    History of Present Illness         83 yr old F with h/o HTN, Depression, GAD, Hypothyroidism, GERD,was evaluated for acute visit for follow up on HTN  Pt seen and examined in her room  She is able to ambulate with the help of rolator walker  Denies pain or feeling dizzy  Pt denies headache, nausea, vomiting, chest pain, palpitations, SOB ,abdominal pain, dysuria, hematuria, bloody or dark stools.  HTN  Her BP has been running better for the past few days Pt feels better She is currently on hydrochlorothiazide  , toprol , losartan     12/12/2023 09:55 136/64 mmHg (Sitting l/arm) 12/11/2023 08:20 158/66 mmHg (Sitting r/arm) Systolic High of 865 exceeded 78/46/9629 17:00 135/78 mmHg (Lying r/arm) 12/10/2023 10:31 146/62 mmHg (Sitting l/arm) Systolic High of 528 exceeded Systolic High of 413 exceeded Systolic High of 244 exceeded 11/03/7251 10:30 146/62 mmHg (Sitting l/arm) Diastolic High of 89 exceeded Systolic High of 664 exceeded 40/34/7425 17:26 165/90 mmHg (Sitting l/arm) 12/09/2023 08:21 170/77 mmHg (Sitting r/arm) Systolic High of 956 exceeded 38/75/6433 17:30 168/74 mmHg (Sitting l/arm) Systolic High of 295 exceeded 18/84/1660 08:28 170/80 mmHg (Sitting r/arm)  Systolic High of 630 exceede   Past Medical History:  Diagnosis Date   Adenomatous colon polyp    Adenomatous polyp of colon 12/03/2004   6mm   Anxiety    Chronic headaches    Chronic insomnia    Depression    Diverticulosis    External hemorrhoids    Hyperlipidemia    Hypertension    Hyperthyroidism    Hypothyroidism    Umbilical hernia    Past Surgical History:  Procedure Laterality Date   ABDOMINAL HYSTERECTOMY  1975   APPENDECTOMY  1975   COLONOSCOPY  03/03/2010,12/03/2004   ESOPHAGOGASTRODUODENOSCOPY  12/16/11   Dr. Loy Ruff   HIP ARTHROPLASTY Right 09/27/2023   Procedure: ARTHROPLASTY BIPOLAR HIP (HEMIARTHROPLASTY);  Surgeon: Arnie Lao, MD;  Location: [facility] OR;  Service: Orthopedics;  Laterality: Right;   OOPHORECTOMY     spinal lipoma removed  2006   TOTAL ABDOMINAL HYSTERECTOMY      reports that she quit smoking about 30 years ago. Her smoking use included cigarettes. She has never used smokeless tobacco. She reports current alcohol  use of about 2.0 standard drinks of alcohol  per week. She reports that she does not use drugs. Social History   Socioeconomic History   Marital status: Married    Spouse name: Not on file   Number of children: 2   Years of education: Not on file   Highest education level: Not on file  Occupational History   Occupation: retired  Tobacco Use   Smoking status: Former    Current packs/day: 0.00    Types: Cigarettes    Quit date:  01/23/1993    Years since quitting: 30.9   Smokeless tobacco: Never  Vaping Use   Vaping status: Never Used  Substance and Sexual Activity   Alcohol  use: Yes    Alcohol /week: 2.0 standard drinks of alcohol     Types: 2 Glasses of wine per week   Drug use: No   Sexual activity: Not on file  Other Topics Concern   Not on file  Social History Narrative   Patient is married she is retired and has 2 children   About 2 alcoholic beverages a week no drugs, no current tobacco she is a former smoker       Lives at home with her husband   Right handed   Social Drivers of Health   Financial Resource Strain: Low Risk  (10/03/2020)   Received from [facility], [facility]   Overall Financial Resource Strain (CARDIA)    Difficulty of Paying Living Expenses: Not hard at all  Food Insecurity: No Food Insecurity (09/27/2023)   Hunger Vital Sign    Worried About Running Out of Food in the Last Year: Never true    Ran Out of Food in the Last Year: Never true  Transportation Needs: No Transportation Needs (09/27/2023)   PRAPARE - Administrator, Civil Service (Medical): No    Lack of Transportation (Non-Medical): No  Physical Activity: Not on file  Stress: Stress Concern Present (10/03/2020)   Received from [facility], [facility] of Occupational Health - Occupational Stress Questionnaire    Feeling of Stress : Rather much  Social Connections: Unknown (03/16/2022)   Received from [facility], [facility]   Social Network    Social Network: Not on file  Intimate Partner Violence: Not At Risk (09/27/2023)   Humiliation, Afraid, Rape, and Kick questionnaire    Fear of Current or Ex-Partner: No    Emotionally Abused: No    Physically Abused: No    Sexually Abused: No    Functional Status Survey:    Family History  Problem Relation Age of Onset   Esophageal cancer Father    Stomach cancer Father        mets from esophagus   Heart disease Mother    Irritable bowel syndrome Mother    Kidney disease Mother    Other Mother        cushings disease   Addison's disease Mother    Heart failure Mother    Stroke Mother    Hypertension Mother    Stroke Maternal Grandmother    Hypertension Maternal Grandmother    Colon cancer Neg Hx    Heart attack Neg Hx     Health Maintenance  Topic Date Due   Pneumonia Vaccine 14+ Years old (1 of 2 - PCV) Never done   DEXA SCAN  Never done   INFLUENZA VACCINE  06/02/2023   COVID-19 Vaccine (5 -  2024-25 season) 07/03/2023   Medicare Annual Wellness (AWV)  11/28/2024   DTaP/Tdap/Td (2 - Td or Tdap) 02/16/2032   Zoster Vaccines- Shingrix  Completed   HPV VACCINES  Aged Out   Hepatitis C Screening  Discontinued    Allergies  Allergen Reactions   Hydrocodone -Acetaminophen  Rash    Vicodin=REACTION: rash  Not listed on the MAR   Iodinated Contrast Media Hives and Rash    Per patient she has developed hives and itching in upper trunk of body after last 2 CT scans once she got homes that she has  just treated at home with benadryl . She has not told anyone until now. States she was able to bear it  Other reaction(s): Rash Per patient she has developed hives and itching in upper trunk of body after last 2 CT scans once she got homes that she has just treated at home with benadryl . She has not told anyone until now. States she was able to bear it   Not listed on the [facility]    Sulfa Antibiotics Rash    REACTION: rash Other reaction(s): Unknown   Codeine     Unknown reaction  Not listed on the [facility]    Sulfonamide Derivatives     REACTION: rash   Terbinafine     Unknown reaction  Not listed on the [facility]    Benazepril  Cough    Not listed on the [facility]     Outpatient Encounter Medications as of 12/12/2023  Medication Sig   acetaminophen  (TYLENOL ) 325 MG tablet Take 650 mg by mouth every 4 (four) hours as needed (for pain). (Patient not taking: Reported on 11/11/2023)   ALPRAZolam  (XANAX ) 0.5 MG tablet Take 1 tablet (0.5 mg total) by mouth 3 (three) times daily as needed.   amLODipine  (NORVASC ) 2.5 MG tablet Take 2.5 mg by mouth daily. (Patient not taking: Reported on 11/11/2023)   aspirin  81 MG chewable tablet Chew 1 tablet (81 mg total) by mouth 2 (two) times daily.   busPIRone  (BUSPAR ) 15 MG tablet Take 15 mg by mouth 2 (two) times daily. Second listing in [facility] states take three times daily   Docusate Sodium  100 MG capsule Take 100 mg by mouth as needed for constipation.    DULoxetine  (CYMBALTA ) 30 MG capsule Take 90 mg by mouth daily.   DULoxetine  (CYMBALTA ) 60 MG capsule Take 1 capsule (60 mg total) by mouth daily. Total daily dose 90 mg (Patient not taking: Reported on 11/29/2023)   estradiol (ESTRACE) 0.1 MG/GM vaginal cream Place 1 Applicatorful vaginally 2 (two) times a week. Tuesday and Friday (Patient not taking: Reported on 11/11/2023)   feeding supplement (ENSURE ENLIVE / ENSURE PLUS) LIQD Take 237 mLs by mouth 2 (two) times daily between meals. (Patient not taking: Reported on 11/29/2023)   gabapentin  (NEURONTIN ) 100 MG capsule Take 200 mg by mouth 2 (two) times daily.   lactose free nutrition (BOOST) LIQD Take 237 mLs by mouth 2 (two) times daily.   levothyroxine  (SYNTHROID ) 100 MCG tablet Take 100 mcg by mouth daily.   loperamide (IMODIUM A-D) 2 MG tablet Take 2 mg by mouth every 8 (eight) hours as needed for diarrhea or loose stools.   losartan  (COZAAR ) 50 MG tablet Take 50 mg by mouth 2 (two) times daily.   methenamine  (HIPREX ) 1 g tablet Take 1 g by mouth 2 (two) times daily.   metoprolol  succinate (TOPROL  XL) 25 MG 24 hr tablet Take 1 tablet (25 mg total) by mouth daily.   mirtazapine  (REMERON ) 15 MG tablet Take 15 mg by mouth at bedtime.   mirtazapine  (REMERON ) 7.5 MG tablet Take 2 tablets (15 mg total) by mouth at bedtime. (Patient not taking: Reported on 11/29/2023)   OLANZapine  (ZYPREXA ) 5 MG tablet Take 5 mg by mouth daily.   ondansetron  (ZOFRAN ) 8 MG tablet Take 8 mg by mouth as needed for nausea or vomiting.   polyethylene glycol (MIRALAX ) 17 g packet Take 17 g by mouth daily.   PREMARIN vaginal cream Place 1 applicator vaginally as directed. (Patient not taking: Reported on 11/29/2023)  saccharomyces boulardii (FLORASTOR) 250 MG capsule Take 250 mg by mouth 2 (two) times daily.   senna (SENOKOT) 8.6 MG TABS tablet Take 1 tablet by mouth daily. May also take 2 tablets by mouth as needed for constipation   traZODone  (DESYREL ) 50 MG tablet Take 50  mg by mouth at bedtime as needed for sleep. (Patient not taking: Reported on 11/29/2023)   No facility-administered encounter medications on file as of 12/12/2023.    Review of Systems  Constitutional:  Negative for chills and fever.  HENT:  Negative for sinus pressure and sore throat.   Respiratory:  Negative for cough, shortness of breath and wheezing.   Cardiovascular:  Negative for chest pain, palpitations and leg swelling.  Gastrointestinal:  Negative for abdominal distention, abdominal pain, blood in stool, constipation, diarrhea, nausea and vomiting.  Genitourinary:  Negative for dysuria, frequency and urgency.  Neurological:  Negative for dizziness, weakness and numbness.  Psychiatric/Behavioral:  Negative for confusion.    Negative unless indicated in HPI.  There were no vitals filed for this visit. There is no height or weight on file to calculate BMI. BP Readings from Last 3 Encounters:  12/05/23 (!) 173/65  11/29/23 129/85  11/28/23 129/85   Wt Readings from Last 3 Encounters:  12/05/23 138 lb 4.8 oz (62.7 kg)  11/29/23 131 lb (59.4 kg)  11/25/23 131 lb 1.6 oz (59.5 kg)   Physical Exam Constitutional:      Appearance: Normal appearance.  HENT:     Head: Normocephalic and atraumatic.  Cardiovascular:     Rate and Rhythm: Normal rate and regular rhythm.  Pulmonary:     Effort: Pulmonary effort is normal. No respiratory distress.     Breath sounds: Normal breath sounds. No wheezing.  Abdominal:     General: Bowel sounds are normal. There is no distension.     Tenderness: There is no abdominal tenderness. There is no guarding or rebound.     Comments:    Musculoskeletal:        General: No swelling or tenderness.  Skin:    General: Skin is dry.  Neurological:     Mental Status: She is alert. Mental status is at baseline.     Sensory: No sensory deficit.     Motor: No weakness.     Labs reviewed: Basic Metabolic Panel: Recent Labs    06/13/23 0403  06/14/23 0357 06/15/23 0340 08/07/23 1245 09/10/23 1823 09/23/23 0000 09/29/23 0524 09/30/23 0614 10/01/23 0340 11/17/23 0000 11/21/23 0000  NA 128* 131* 133*   < > 130*   < > 132* 134* 135 138 139  K 4.0 4.0 3.8   < > 4.3   < > 3.9 3.6 3.7 4.0 4.0  CL 99 100 102   < > 96*   < > 104 101 104 106 105  CO2 21* 25 25   < > 25   < > 25 23 25  24* 28*  GLUCOSE 92 92 95   < > 91   < > 89 89 108*  --   --   BUN 14 12 11    < > 10   < > 18 16 12 15 19   CREATININE 1.36* 0.93 0.73   < > 1.02*   < > 1.00 0.79 0.78 0.7 0.9  CALCIUM  9.2 8.8* 8.7*   < > 10.3   < > 8.7* 9.0 9.0 9.3 9.2  MG 2.3 2.0 2.0  --  1.9  --   --   --   --   --   --  PHOS 3.9 3.3 3.6  --   --   --   --   --   --   --   --    < > = values in this interval not displayed.   Liver Function Tests: Recent Labs    08/07/23 1245 09/08/23 1532 09/23/23 0000 09/26/23 2043  AST 21 21 19 29   ALT 15 18 14 23   ALKPHOS 60 76 76 82  BILITOT 1.0 0.7  --  0.7  PROT 6.6 7.3  --  6.6  ALBUMIN  4.0 4.3 3.7 3.6   Recent Labs    08/07/23 1245  LIPASE 36   No results for input(s): "AMMONIA" in the last 8760 hours. CBC: Recent Labs    09/08/23 1532 09/10/23 1823 09/23/23 0000 09/26/23 2043 09/27/23 0607 09/29/23 0524 09/30/23 0614 10/01/23 0340  WBC 5.5   < > 5.3 5.2   < > 9.9 8.5 8.4  NEUTROABS 3.8  --  3,058.00 3.5  --   --   --   --   HGB 11.5*   < > 11.1* 11.8*   < > 9.3* 10.2* 10.8*  HCT 34.4*   < > 33* 36.3   < > 27.6* 30.0* 32.6*  MCV 94.2   < >  --  96.0   < > 92.3 91.7 90.8  PLT 239   < > 223 216   < > 172 192 227   < > = values in this interval not displayed.   Cardiac Enzymes: No results for input(s): "CKTOTAL", "CKMB", "CKMBINDEX", "TROPONINI" in the last 8760 hours. BNP: Invalid input(s): "POCBNP" No results found for: "HGBA1C" Lab Results  Component Value Date   TSH 0.77 09/23/2023   Lab Results  Component Value Date   VITAMINB12 323 10/27/2023   Lab Results  Component Value Date   FOLATE 12.4  06/13/2023   Lab Results  Component Value Date   IRON 52 10/27/2023   TIBC 258 10/27/2023   FERRITIN 110 10/27/2023    Imaging and Procedures obtained prior to SNF admission: US  RENAL Result Date: 09/27/2023 CLINICAL DATA:  Acute kidney injury EXAM: RENAL / URINARY TRACT ULTRASOUND COMPLETE COMPARISON:  CT 08/07/2023 FINDINGS: Right Kidney: Renal measurements: 9.4 x 4.8 x 4.6 cm = volume: 108.5 mL. Echogenicity within normal limits. Mild right hydronephrosis. Left Kidney: Renal measurements: 9.4 x 5.2 x 4 cm = volume: 102.1 mL. Echogenicity within normal limits. No mass or hydronephrosis. Bladder: Catheter in the bladder.  Moderate to marked bladder distension Other: None. IMPRESSION: 1. Mild right hydronephrosis. 2. Moderate to marked bladder distension with catheter in place. Correlate for catheter function Electronically Signed   By: Esmeralda Hedge M.D.   On: 09/27/2023 23:33   DG Pelvis Portable Result Date: 09/27/2023 CLINICAL DATA:  Postop. EXAM: PORTABLE PELVIS 1-2 VIEWS COMPARISON:  Preoperative imaging FINDINGS: Right hip hemiarthroplasty in expected alignment. No periprosthetic lucency or fracture. Recent postsurgical change includes air and edema in the soft tissues. Lateral skin staples in place. IMPRESSION: Right hip hemiarthroplasty without immediate postoperative complication. Electronically Signed   By: Chadwick Colonel M.D.   On: 09/27/2023 17:36   DG HIP UNILAT WITH PELVIS 1V RIGHT Result Date: 09/27/2023 CLINICAL DATA:  [id number] Surgery, elective [id number] EXAM: DG HIP (WITH OR WITHOUT PELVIS) 1V RIGHT COMPARISON:  Pre-Operative imaging FINDINGS: Four fluoroscopic spot views of the pelvis and right hip obtained in the operating room. Images during hip arthroplasty. Fluoroscopy time 14 seconds. Dose 0.89 mGy. IMPRESSION: Intraoperative fluoroscopy during  right hip arthroplasty. Electronically Signed   By: Chadwick Colonel M.D.   On: 09/27/2023 17:36   DG C-Arm 1-60 Min-No  Report Result Date: 09/27/2023 Fluoroscopy was utilized by the requesting physician.  No radiographic interpretation.   DG FEMUR PORT, MIN 2 VIEWS RIGHT Result Date: 09/27/2023 CLINICAL DATA:  Right hip fracture EXAM: RIGHT FEMUR PORTABLE 2 VIEW COMPARISON:  None Available. FINDINGS: Two view radiograph of the right femur again demonstrates an impacted subcapital right femoral neck fracture. No dislocation. Right hip joint space preserved. The distal fracture fragment consisting of the femoral shaft is intact. Limited images of the right knee are unremarkable. IMPRESSION: 1. Impacted subcapital right femoral neck fracture. Distal femur appears intact. Electronically Signed   By: Worthy Heads M.D.   On: 09/27/2023 00:13   DG CHEST PORT 1 VIEW Result Date: 09/27/2023 CLINICAL DATA:  Right hip fracture. Medical clearance for surgical intervention EXAM: PORTABLE CHEST 1 VIEW COMPARISON:  None Available. FINDINGS: The heart size and mediastinal contours are within normal limits. Both lungs are clear. The visualized skeletal structures are unremarkable. IMPRESSION: No active disease. Electronically Signed   By: Worthy Heads M.D.   On: 09/27/2023 00:12   DG Hip Unilat W or Wo Pelvis 2-3 Views Right Result Date: 09/26/2023 CLINICAL DATA:  Fall EXAM: DG HIP (WITH OR WITHOUT PELVIS) 2-3V RIGHT COMPARISON:  None Available. FINDINGS: There is an acute, impacted subcapital right femoral neck fracture. The femoral head is still seated within the right acetabulum and right hip joint space is preserved. The pelvis and visualized left hip are intact. Sacroiliac and left hip joint spaces are preserved. Soft tissues are unremarkable. IMPRESSION: 1. Acute, impacted subcapital right femoral neck fracture. Electronically Signed   By: Worthy Heads M.D.   On: 09/26/2023 22:02    Assessment and Plan    1. Primary hypertension (Primary) Bp running better Will cont with the same Will monitor  Avoid fried  foods  2. Hyponatremia Will check bmp tomorrow  3. GAD (generalized anxiety disorder) Doing better Cont with the same      30 min Total time spent for obtaining history,  performing a medically appropriate examination and evaluation, reviewing the tests,   documenting clinical information in the electronic or other health record,  ,care coordination (not separately reported)

## 2023-12-13 DIAGNOSIS — I10 Essential (primary) hypertension: Secondary | ICD-10-CM | POA: Diagnosis not present

## 2023-12-13 DIAGNOSIS — R0602 Shortness of breath: Secondary | ICD-10-CM | POA: Diagnosis not present

## 2023-12-13 LAB — BASIC METABOLIC PANEL
BUN: 17 (ref 4–21)
CO2: 29 — AB (ref 13–22)
Chloride: 104 (ref 99–108)
Creatinine: 0.8 (ref 0.5–1.1)
Glucose: 90
Potassium: 4.4 meq/L (ref 3.5–5.1)
Sodium: 138 (ref 137–147)

## 2023-12-13 LAB — COMPREHENSIVE METABOLIC PANEL
Calcium: 9.2 (ref 8.7–10.7)
eGFR: 74

## 2023-12-20 ENCOUNTER — Encounter: Payer: Self-pay | Admitting: Nurse Practitioner

## 2023-12-20 ENCOUNTER — Non-Acute Institutional Stay (SKILLED_NURSING_FACILITY): Payer: Self-pay | Admitting: Nurse Practitioner

## 2023-12-20 DIAGNOSIS — K5909 Other constipation: Secondary | ICD-10-CM | POA: Diagnosis not present

## 2023-12-20 DIAGNOSIS — G63 Polyneuropathy in diseases classified elsewhere: Secondary | ICD-10-CM | POA: Diagnosis not present

## 2023-12-20 DIAGNOSIS — N390 Urinary tract infection, site not specified: Secondary | ICD-10-CM | POA: Diagnosis not present

## 2023-12-20 DIAGNOSIS — R946 Abnormal results of thyroid function studies: Secondary | ICD-10-CM

## 2023-12-20 DIAGNOSIS — E079 Disorder of thyroid, unspecified: Secondary | ICD-10-CM

## 2023-12-20 DIAGNOSIS — E871 Hypo-osmolality and hyponatremia: Secondary | ICD-10-CM | POA: Diagnosis not present

## 2023-12-20 DIAGNOSIS — F332 Major depressive disorder, recurrent severe without psychotic features: Secondary | ICD-10-CM

## 2023-12-20 DIAGNOSIS — E0590 Thyrotoxicosis, unspecified without thyrotoxic crisis or storm: Secondary | ICD-10-CM

## 2023-12-20 DIAGNOSIS — I10 Essential (primary) hypertension: Secondary | ICD-10-CM

## 2023-12-20 NOTE — Progress Notes (Signed)
Location:  [facility] Room Number: N044-A Place of Service:  SNF (31)  Provider: Malaky Tetrault X, NP    PCP: Mahlon Gammon, MD Patient Care Team: Mahlon Gammon, MD as PCP - General (Internal Medicine) Jens Som Madolyn Frieze, MD as PCP - Cardiology (Cardiology) Duke, Roe Rutherford, PA as Physician Assistant (Cardiology)  Extended Emergency Contact Information Primary Emergency Contact: [facility] Phone: [phone] Relation: Son  Code Status:  Full Code Goals of care:  Advanced Directive information    12/20/2023    8:32 AM  Advanced Directives  Does Patient Have a Medical Advance Directive? No  Does patient want to make changes to medical advance directive? No - Patient declined  Would patient like information on creating a medical advance directive? No - Patient declined     Allergies  Allergen Reactions   Hydrocodone-Acetaminophen Rash    Vicodin=REACTION: rash  Not listed on the MAR   Iodinated Contrast Media Hives and Rash    Per patient she has developed hives and itching in upper trunk of body after last 2 CT scans once she got homes that she has just treated at home with benadryl. She has not told anyone until now. States she was able to bear it  Other reaction(s): Rash Per patient she has developed hives and itching in upper trunk of body after last 2 CT scans once she got homes that she has just treated at home with benadryl. She has not told anyone until now. States she was able to bear it   Not listed on the [facility]    Sulfa Antibiotics Rash    REACTION: rash Other reaction(s): Unknown   Codeine     Unknown reaction  Not listed on the MAR    Sulfonamide Derivatives     REACTION: rash   Terbinafine     Unknown reaction  Not listed on the [facility]    Benazepril Cough    Not listed on the [facility]     Chief Complaint  Patient presents with   Acute Visit    SNF [facility] discharge    HPI:  83 y.o. female with medical history  significant for HTN, depression, anxiety, insomnia, hypothyroidism, GERD, hyponatremia, and peripheral neuropathy was admitted to [facility] for therapy following hospitalization.   The patient has recovered from surgery and regained physical strength and ALD function. She is stable to transfer to [facility] for continuation of therapy and care.    Hospitalized 09/26/23-10/04/23 for closed R hip fracture, s/p ORIF, healed, ambulates with walker independently.              Peripheral neuropathy, on Gabapentin             HTN, on Losartan, Metoprolol, hydrochlorothiazide, off prn Hydralazine, Amlodipine.             CKD, Bun/creat 17/0.8 12/13/23             Depression, Alprazolam prn, Buspirone, Duloxetine, Mirtazapine, Olanzapine             Constipation, on Colace prn, Senokot, MiraLax             Methenamine for UTI suppression.              Hyponatremia, off salt tabs, Na 138 12/13/23             Hypothyroidism, taking Levothyroxine, TSH 0.77 09/23/23             GERD, Hgb 10.8  10/01/23, Iron 52 10/27/23     Past Medical History:  Diagnosis Date   Adenomatous colon polyp    Adenomatous polyp of colon 12/03/2004   6mm   Anxiety    Chronic headaches    Chronic insomnia    Depression    Diverticulosis    External hemorrhoids    Hyperlipidemia    Hypertension    Hyperthyroidism    Hypothyroidism    Umbilical hernia     Past Surgical History:  Procedure Laterality Date   ABDOMINAL HYSTERECTOMY  1975   APPENDECTOMY  1975   COLONOSCOPY  03/03/2010,12/03/2004   ESOPHAGOGASTRODUODENOSCOPY  12/16/11   Dr. Stan Head   HIP ARTHROPLASTY Right 09/27/2023   Procedure: ARTHROPLASTY BIPOLAR HIP (HEMIARTHROPLASTY);  Surgeon: Kathryne Hitch, MD;  Location: [facility] OR;  Service: Orthopedics;  Laterality: Right;   OOPHORECTOMY     spinal lipoma removed  2006   TOTAL ABDOMINAL HYSTERECTOMY        reports that she quit smoking about 30 years ago. Her smoking use included cigarettes. She has never  used smokeless tobacco. She reports current alcohol use of about 2.0 standard drinks of alcohol per week. She reports that she does not use drugs. Social History   Socioeconomic History   Marital status: Married    Spouse name: Not on file   Number of children: 2   Years of education: Not on file   Highest education level: Not on file  Occupational History   Occupation: retired  Tobacco Use   Smoking status: Former    Current packs/day: 0.00    Types: Cigarettes    Quit date: 01/23/1993    Years since quitting: 30.9   Smokeless tobacco: Never  Vaping Use   Vaping status: Never Used  Substance and Sexual Activity   Alcohol use: Yes    Alcohol/week: 2.0 standard drinks of alcohol    Types: 2 Glasses of wine per week   Drug use: No   Sexual activity: Not on file  Other Topics Concern   Not on file  Social History Narrative   Patient is married she is retired and has 2 children   About 2 alcoholic beverages a week no drugs, no current tobacco she is a former smoker      Lives at home with her husband   Right handed   Social Drivers of Health   Financial Resource Strain: Low Risk  (10/03/2020)   Received from [facility], [facility]   Overall Financial Resource Strain (CARDIA)    Difficulty of Paying Living Expenses: Not hard at all  Food Insecurity: No Food Insecurity (09/27/2023)   Hunger Vital Sign    Worried About Running Out of Food in the Last Year: Never true    Ran Out of Food in the Last Year: Never true  Transportation Needs: No Transportation Needs (09/27/2023)   PRAPARE - Administrator, Civil Service (Medical): No    Lack of Transportation (Non-Medical): No  Physical Activity: Not on file  Stress: Stress Concern Present (10/03/2020)   Received from [facility], [facility] - Occupational Stress Questionnaire    Feeling of Stress : Rather much  Social Connections: Unknown (03/16/2022)   Received  from [facility], [facility]   Social Network    Social Network: Not on file  Intimate Partner Violence: Not At Risk (09/27/2023)   Humiliation, Afraid, Rape, and Kick questionnaire    Fear of  Current or Ex-Partner: No    Emotionally Abused: No    Physically Abused: No    Sexually Abused: No   Functional Status Survey:    Allergies  Allergen Reactions   Hydrocodone-Acetaminophen Rash    Vicodin=REACTION: rash  Not listed on the MAR   Iodinated Contrast Media Hives and Rash    Per patient she has developed hives and itching in upper trunk of body after last 2 CT scans once she got homes that she has just treated at home with benadryl. She has not told anyone until now. States she was able to bear it  Other reaction(s): Rash Per patient she has developed hives and itching in upper trunk of body after last 2 CT scans once she got homes that she has just treated at home with benadryl. She has not told anyone until now. States she was able to bear it   Not listed on the [facility]    Sulfa Antibiotics Rash    REACTION: rash Other reaction(s): Unknown   Codeine     Unknown reaction  Not listed on the MAR    Sulfonamide Derivatives     REACTION: rash   Terbinafine     Unknown reaction  Not listed on the [facility]    Benazepril Cough    Not listed on the [facility]     Pertinent  Health Maintenance Due  Topic Date Due   DEXA SCAN  Never done   INFLUENZA VACCINE  Completed    Medications: Outpatient Encounter Medications as of 12/20/2023  Medication Sig   ALPRAZolam (XANAX) 0.5 MG tablet Take 1 tablet (0.5 mg total) by mouth 3 (three) times daily as needed.   aspirin 81 MG chewable tablet Chew 1 tablet (81 mg total) by mouth 2 (two) times daily.   busPIRone (BUSPAR) 15 MG tablet Take 15 mg by mouth 2 (two) times daily. Second listing in [facility] states take three times daily   Docusate Sodium 100 MG capsule Take 100 mg by mouth as needed for constipation.   DULoxetine  (CYMBALTA) 30 MG capsule Take 90 mg by mouth daily.   gabapentin (NEURONTIN) 100 MG capsule Take 200 mg by mouth 2 (two) times daily.   hydrochlorothiazide (HYDRODIURIL) 12.5 MG tablet Take 12.5 mg by mouth daily.   lactose free nutrition (BOOST) LIQD Take 237 mLs by mouth 2 (two) times daily.   levothyroxine (SYNTHROID) 100 MCG tablet Take 100 mcg by mouth daily.   loperamide (IMODIUM A-D) 2 MG tablet Take 2 mg by mouth every 8 (eight) hours as needed for diarrhea or loose stools.   losartan (COZAAR) 50 MG tablet Take 50 mg by mouth 2 (two) times daily.   methenamine (HIPREX) 1 g tablet Take 1 g by mouth 2 (two) times daily.   metoprolol succinate (TOPROL XL) 25 MG 24 hr tablet Take 1 tablet (25 mg total) by mouth daily.   mirtazapine (REMERON) 15 MG tablet Take 15 mg by mouth at bedtime.   OLANZapine (ZYPREXA) 5 MG tablet Take 5 mg by mouth daily.   ondansetron (ZOFRAN) 8 MG tablet Take 8 mg by mouth as needed for nausea or vomiting.   polyethylene glycol (MIRALAX) 17 g packet Take 17 g by mouth daily.   saccharomyces boulardii (FLORASTOR) 250 MG capsule Take 250 mg by mouth 2 (two) times daily.   senna (SENOKOT) 8.6 MG TABS tablet Take 1 tablet by mouth daily. May also take 2 tablets by mouth as needed for constipation  acetaminophen (TYLENOL) 325 MG tablet Take 650 mg by mouth every 4 (four) hours as needed (for pain). (Patient not taking: Reported on 11/11/2023)   amLODipine (NORVASC) 2.5 MG tablet Take 2.5 mg by mouth daily. (Patient not taking: Reported on 11/11/2023)   DULoxetine (CYMBALTA) 60 MG capsule Take 1 capsule (60 mg total) by mouth daily. Total daily dose 90 mg (Patient not taking: Reported on 12/20/2023)   estradiol (ESTRACE) 0.1 MG/GM vaginal cream Place 1 Applicatorful vaginally 2 (two) times a week. Tuesday and Friday (Patient not taking: Reported on 11/11/2023)   feeding supplement (ENSURE ENLIVE / ENSURE PLUS) LIQD Take 237 mLs by mouth 2 (two) times daily between meals.  (Patient not taking: Reported on 12/20/2023)   mirtazapine (REMERON) 7.5 MG tablet Take 2 tablets (15 mg total) by mouth at bedtime. (Patient not taking: Reported on 12/20/2023)   PREMARIN vaginal cream Place 1 applicator vaginally as directed. (Patient not taking: Reported on 12/20/2023)   traZODone (DESYREL) 50 MG tablet Take 50 mg by mouth at bedtime as needed for sleep. (Patient not taking: Reported on 11/29/2023)   No facility-administered encounter medications on file as of 12/20/2023.    Review of Systems  Constitutional:  Negative for appetite change, fatigue and fever.  HENT:  Negative for congestion and trouble swallowing.   Eyes:  Negative for visual disturbance.  Respiratory:  Negative for cough and shortness of breath.   Cardiovascular:  Negative for leg swelling.  Gastrointestinal:  Negative for abdominal pain and constipation.  Genitourinary:  Positive for frequency. Negative for dysuria and urgency.  Musculoskeletal:  Positive for arthralgias and gait problem.  Skin:  Negative for color change.  Neurological:  Negative for weakness and headaches.  Psychiatric/Behavioral:  Negative for behavioral problems and sleep disturbance. The patient is not nervous/anxious.     Vitals:   12/20/23 0827  BP: (!) 148/84  Pulse: 71  Resp: 18  Temp: (!) 97.4 F (36.3 C)  SpO2: 95%  Weight: 138 lb 4.8 oz (62.7 kg)  Height: 5\' 4"  (1.626 m)   Body mass index is 23.74 kg/m. Physical Exam Vitals and nursing note reviewed.  Constitutional:      Appearance: Normal appearance.  HENT:     Nose: Nose normal.     Mouth/Throat:     Mouth: Mucous membranes are moist.  Eyes:     Extraocular Movements: Extraocular movements intact.     Pupils: Pupils are equal, round, and reactive to light.  Cardiovascular:     Rate and Rhythm: Normal rate.     Heart sounds: No murmur heard. Pulmonary:     Breath sounds: No rales.  Abdominal:     General: Bowel sounds are normal.     Tenderness: There  is no abdominal tenderness.  Musculoskeletal:        General: No tenderness.     Cervical back: Normal range of motion.     Right lower leg: No edema.     Left lower leg: No edema.  Skin:    General: Skin is warm and dry.     Comments: R hip surgical site is covered with dressing, clean  Neurological:     General: No focal deficit present.     Mental Status: She is alert and oriented to person, place, and time. Mental status is at baseline.     Motor: No weakness.     Gait: Gait abnormal.  Psychiatric:        Mood and Affect: Mood normal.  Behavior: Behavior normal.        Thought Content: Thought content normal.     Labs reviewed: Basic Metabolic Panel: Recent Labs    06/13/23 0403 06/14/23 0357 06/15/23 0340 08/07/23 1245 09/10/23 1823 09/23/23 0000 09/29/23 0524 09/30/23 0614 10/01/23 0340 11/17/23 0000 11/21/23 0000 12/13/23 0000  NA 128* 131* 133*   < > 130*   < > 132* 134* 135 138 139 138  K 4.0 4.0 3.8   < > 4.3   < > 3.9 3.6 3.7 4.0 4.0 4.4  CL 99 100 102   < > 96*   < > 104 101 104 106 105 104  CO2 21* 25 25   < > 25   < > 25 23 25  24* 28* 29*  GLUCOSE 92 92 95   < > 91   < > 89 89 108*  --   --   --   BUN 14 12 11    < > 10   < > 18 16 12 15 19 17   CREATININE 1.36* 0.93 0.73   < > 1.02*   < > 1.00 0.79 0.78 0.7 0.9 0.8  CALCIUM 9.2 8.8* 8.7*   < > 10.3   < > 8.7* 9.0 9.0 9.3 9.2 9.2  MG 2.3 2.0 2.0  --  1.9  --   --   --   --   --   --   --   PHOS 3.9 3.3 3.6  --   --   --   --   --   --   --   --   --    < > = values in this interval not displayed.   Liver Function Tests: Recent Labs    08/07/23 1245 09/08/23 1532 09/23/23 0000 09/26/23 2043  AST 21 21 19 29   ALT 15 18 14 23   ALKPHOS 60 76 76 82  BILITOT 1.0 0.7  --  0.7  PROT 6.6 7.3  --  6.6  ALBUMIN 4.0 4.3 3.7 3.6   Recent Labs    08/07/23 1245  LIPASE 36   No results for input(s): "AMMONIA" in the last 8760 hours. CBC: Recent Labs    09/08/23 1532 09/10/23 1823  09/23/23 0000 09/26/23 2043 09/27/23 0607 09/29/23 0524 09/30/23 0614 10/01/23 0340  WBC 5.5   < > 5.3 5.2   < > 9.9 8.5 8.4  NEUTROABS 3.8  --  3,058.00 3.5  --   --   --   --   HGB 11.5*   < > 11.1* 11.8*   < > 9.3* 10.2* 10.8*  HCT 34.4*   < > 33* 36.3   < > 27.6* 30.0* 32.6*  MCV 94.2   < >  --  96.0   < > 92.3 91.7 90.8  PLT 239   < > 223 216   < > 172 192 227   < > = values in this interval not displayed.   Cardiac Enzymes: No results for input(s): "CKTOTAL", "CKMB", "CKMBINDEX", "TROPONINI" in the last 8760 hours. BNP: Invalid input(s): "POCBNP" CBG: No results for input(s): "GLUCAP" in the last 8760 hours.  Procedures and Imaging Studies During Stay: XR HIP UNILAT W OR W/O PELVIS 2-3 VIEWS RIGHT Result Date: 11/23/2023 An AP pelvis and lateral the right hip shows a well-seated hemiarthroplasty with no complicating features.   Assessment/Plan:   Essential (primary) hypertension Blood pressure is controlled, on Losartan, Metoprolol, hydrochlorothiazide, off prn Hydralazine, Amlodipine. 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  Social services at their facility was responsible for arranging this appointment.  Pt was provided  with adequate prescriptions of noncontrolled medications to reach the scheduled appointment .  For controlled substances, a limited supply was provided as appropriate for the individual patient.  If the pt normally receives these medications from a pain clinic or has a contract with another physician, these medications should be received from that clinic or physician only).    Future labs/tests needed:  prn

## 2023-12-20 NOTE — Assessment & Plan Note (Signed)
Her mood is stabilizing, continue Alprazolam prn, Buspirone, Duloxetine, Mirtazapine, Olanzapine

## 2023-12-20 NOTE — Assessment & Plan Note (Signed)
Stable,  on Colace prn, Senokot, MiraLax

## 2023-12-20 NOTE — Assessment & Plan Note (Signed)
Continue Methenamine for suppression

## 2023-12-20 NOTE — Assessment & Plan Note (Signed)
 on Gabapentin ?

## 2023-12-20 NOTE — Assessment & Plan Note (Addendum)
Blood pressure is loose controlled, on Losartan, Metoprolol, hydrochlorothiazide, off prn Hydralazine, Amlodipine. Bun/creat 17/0.8 12/13/23

## 2023-12-20 NOTE — Assessment & Plan Note (Signed)
off salt tabs, Na 138 12/13/23

## 2023-12-20 NOTE — Assessment & Plan Note (Signed)
 taking Levothyroxine, TSH 0.77 09/23/23

## 2023-12-20 NOTE — Progress Notes (Signed)
 This encounter was created in error - please disregard.

## 2023-12-23 ENCOUNTER — Other Ambulatory Visit: Payer: Self-pay | Admitting: Orthopedic Surgery

## 2023-12-23 ENCOUNTER — Non-Acute Institutional Stay: Payer: Medicare PPO | Admitting: Orthopedic Surgery

## 2023-12-23 ENCOUNTER — Encounter: Payer: Self-pay | Admitting: Orthopedic Surgery

## 2023-12-23 DIAGNOSIS — E871 Hypo-osmolality and hyponatremia: Secondary | ICD-10-CM | POA: Diagnosis not present

## 2023-12-23 DIAGNOSIS — Z8781 Personal history of (healed) traumatic fracture: Secondary | ICD-10-CM

## 2023-12-23 DIAGNOSIS — S72001D Fracture of unspecified part of neck of right femur, subsequent encounter for closed fracture with routine healing: Secondary | ICD-10-CM | POA: Diagnosis not present

## 2023-12-23 DIAGNOSIS — I10 Essential (primary) hypertension: Secondary | ICD-10-CM | POA: Diagnosis not present

## 2023-12-23 DIAGNOSIS — M6281 Muscle weakness (generalized): Secondary | ICD-10-CM | POA: Diagnosis not present

## 2023-12-23 DIAGNOSIS — M25551 Pain in right hip: Secondary | ICD-10-CM | POA: Diagnosis not present

## 2023-12-23 DIAGNOSIS — R2681 Unsteadiness on feet: Secondary | ICD-10-CM | POA: Diagnosis not present

## 2023-12-23 MED ORDER — SPIRONOLACTONE 25 MG PO TABS: 25.0000 mg | ORAL_TABLET | Freq: Every day | ORAL | Status: DC

## 2023-12-23 MED ORDER — METOPROLOL SUCCINATE ER 25 MG PO TB24: 25.0000 mg | ORAL_TABLET | Freq: Every day | ORAL | Status: DC

## 2023-12-23 NOTE — Progress Notes (Signed)
Location:   [facility]  Nursing Home Room Number: 33-A Place of Service:  ALF [phone]) Provider:  Hazle Nordmann, NP  PCP: Mahlon Gammon, MD  Patient Care Team: Mahlon Gammon, MD as PCP - General (Internal Medicine) Lewayne Bunting, MD as PCP - Cardiology (Cardiology) Duke, Roe Rutherford, PA as Physician Assistant (Cardiology)  Extended Emergency Contact Information Primary Emergency Contact: [facility] Phone: [phone] Relation: Son  Code Status:  FULL CODE Goals of care: Advanced Directive information    12/23/2023    1:55 PM  Advanced Directives  Does Patient Have a Medical Advance Directive? No  Does patient want to make changes to medical advance directive? No - Patient declined     Chief Complaint  Patient presents with   Acute Visit    Elevated blood pressure     HPI:  Debbie Wells is a 83 y.o. female seen today for an acute visit due to elevated blood pressure.   She recently moved to AL at [facility] 09/12/2023. PMH: HTN, HLD, migraines, IBS, thyroid disease, neuropathy, CKD, recurrent UTI, hyponatremia, major depression, anxiety and insomnia.   Hospitalized 11/25-12/03 due to right hip fracture. 11/26 she underwent right hemiarthroplasty 11/26. She was discharged to SNF for additional Debbie Wells/OT/ nursing services. 02/20 she moved back to [place]. Recent blood pressures 165/77 and 158/81. She is currently taking hydrochlorothiazide, metoprolol and losartan for blood pressure control. Amlodipine and hydralazine prn discontinued. H/o hyponatremia. She was started on sodium tablets prior to recent hip fracture. Current Na+ 138 12/13/2023. She was taken off sodium tablets prior to AL transfer. Denies right hip pain. Ambulating well with walker. No recent falls. Afebrile. Vitals stable.   Denies chest pain, sob, headaches or blurred vision.    Past Medical History:  Diagnosis Date   Adenomatous colon polyp    Adenomatous polyp of colon 12/03/2004   6mm    Anxiety    Chronic headaches    Chronic insomnia    Depression    Diverticulosis    External hemorrhoids    Hyperlipidemia    Hypertension    Hyperthyroidism    Hypothyroidism    Umbilical hernia    Past Surgical History:  Procedure Laterality Date   ABDOMINAL HYSTERECTOMY  1975   APPENDECTOMY  1975   COLONOSCOPY  03/03/2010,12/03/2004   ESOPHAGOGASTRODUODENOSCOPY  12/16/11   Dr. Stan Head   HIP ARTHROPLASTY Right 09/27/2023   Procedure: ARTHROPLASTY BIPOLAR HIP (HEMIARTHROPLASTY);  Surgeon: Kathryne Hitch, MD;  Location: [facility] OR;  Service: Orthopedics;  Laterality: Right;   OOPHORECTOMY     spinal lipoma removed  2006   TOTAL ABDOMINAL HYSTERECTOMY      Allergies  Allergen Reactions   Hydrocodone-Acetaminophen Rash    Vicodin=REACTION: rash  Not listed on the MAR   Iodinated Contrast Media Hives and Rash    Per patient she has developed hives and itching in upper trunk of body after last 2 CT scans once she got homes that she has just treated at home with benadryl. She has not told anyone until now. States she was able to bear it  Other reaction(s): Rash Per patient she has developed hives and itching in upper trunk of body after last 2 CT scans once she got homes that she has just treated at home with benadryl. She has not told anyone until now. States she was able to bear it   Not listed on the [facility]    Sulfa Antibiotics Rash    REACTION:  rash Other reaction(s): Unknown   Codeine     Unknown reaction  Not listed on the MAR    Sulfonamide Derivatives     REACTION: rash   Terbinafine     Unknown reaction  Not listed on the [facility]    Benazepril Cough    Not listed on the [facility]     Allergies as of 12/23/2023       Reactions   Hydrocodone-acetaminophen Rash   Vicodin=REACTION: rash Not listed on the [facility]   Iodinated Contrast Media Hives, Rash   Per patient she has developed hives and itching in upper trunk of body after last 2 CT scans once she got homes  that she has just treated at home with benadryl. She has not told anyone until now. States she was able to bear it  Other reaction(s): Rash Per patient she has developed hives and itching in upper trunk of body after last 2 CT scans once she got homes that she has just treated at home with benadryl. She has not told anyone until now. States she was able to bear it  Not listed on the [facility]   Sulfa Antibiotics Rash   REACTION: rash Other reaction(s): Unknown   Codeine    Unknown reaction  Not listed on the [facility]   Sulfonamide Derivatives    REACTION: rash   Terbinafine    Unknown reaction Not listed on the [facility]   Benazepril Cough   Not listed on the [facility]        Medication List        Accurate as of December 23, 2023  1:55 PM. If you have any questions, ask your nurse or doctor.          STOP taking these medications    acetaminophen 325 MG tablet Commonly known as: TYLENOL Stopped by: Luberta Robertson Jef Futch   amLODipine 2.5 MG tablet Commonly known as: NORVASC Stopped by: Octavia Heir   estradiol 0.1 MG/GM vaginal cream Commonly known as: ESTRACE Stopped by: Octavia Heir   feeding supplement Liqd Stopped by: Luay Balding E Kira Hartl   lactose free nutrition Liqd Stopped by: Octavia Heir   metoprolol succinate 25 MG 24 hr tablet Commonly known as: Toprol XL Stopped by: Octavia Heir   Premarin vaginal cream Generic drug: conjugated estrogens Stopped by: Octavia Heir   traZODone 50 MG tablet Commonly known as: DESYREL Stopped by: Octavia Heir       TAKE these medications    ALPRAZolam 0.5 MG tablet Commonly known as: XANAX Take 0.5 mg by mouth as needed for anxiety. What changed: Another medication with the same name was removed. Continue taking this medication, and follow the directions you see here. Changed by: Octavia Heir   aspirin 81 MG chewable tablet Chew 1 tablet (81 mg total) by mouth 2 (two) times daily.   busPIRone 15 MG tablet Commonly known as: BUSPAR Take 15 mg by  mouth 2 (two) times daily. Second listing in [facility] states take three times daily   Docusate Sodium 100 MG capsule Take 100 mg by mouth as needed for constipation.   DULoxetine 30 MG capsule Commonly known as: CYMBALTA Take 90 mg by mouth daily. What changed: Another medication with the same name was removed. Continue taking this medication, and follow the directions you see here. Changed by: Octavia Heir   gabapentin 100 MG capsule Commonly known as: NEURONTIN Take 200 mg by mouth 2 (two) times  daily.   hydrochlorothiazide 12.5 MG tablet Commonly known as: HYDRODIURIL Take 12.5 mg by mouth daily.   levothyroxine 100 MCG tablet Commonly known as: SYNTHROID Take 100 mcg by mouth daily.   loperamide 2 MG tablet Commonly known as: IMODIUM A-D Take 2 mg by mouth every 8 (eight) hours as needed for diarrhea or loose stools.   losartan 50 MG tablet Commonly known as: COZAAR Take 50 mg by mouth 2 (two) times daily.   methenamine 1 g tablet Commonly known as: HIPREX Take 1 g by mouth 2 (two) times daily.   mirtazapine 15 MG tablet Commonly known as: REMERON Take 15 mg by mouth at bedtime. What changed: Another medication with the same name was removed. Continue taking this medication, and follow the directions you see here. Changed by: Luberta Robertson Story Conti   OLANZapine 5 MG tablet Commonly known as: ZYPREXA Take 5 mg by mouth daily.   ondansetron 4 MG tablet Commonly known as: ZOFRAN Take 4 mg by mouth daily as needed for nausea or vomiting. What changed: Another medication with the same name was removed. Continue taking this medication, and follow the directions you see here. Changed by: Octavia Heir   polyethylene glycol 17 g packet Commonly known as: MiraLax Take 17 g by mouth daily.   saccharomyces boulardii 250 MG capsule Commonly known as: FLORASTOR Take 250 mg by mouth 2 (two) times daily.   senna 8.6 MG Tabs tablet Commonly known as: SENOKOT Take 1 tablet  by mouth daily. May also take 2 tablets by mouth as needed for constipation   senna 8.6 MG Tabs tablet Commonly known as: SENOKOT Take 1 tablet by mouth daily.        Review of Systems  Constitutional: Negative.   HENT: Negative.    Respiratory:  Negative for shortness of breath.   Cardiovascular:  Negative for chest pain.  Gastrointestinal: Negative.   Genitourinary: Negative.   Musculoskeletal:  Positive for gait problem.  Neurological:  Negative for dizziness and headaches.  Psychiatric/Behavioral:  Positive for dysphoric mood. The patient is nervous/anxious.     Immunization History  Administered Date(s) Administered   Fluad Quad(high Dose 65+) 12/07/2023   Influenza, High Dose Seasonal PF 08/04/2017, 07/27/2018, 07/27/2020   Influenza,inj,Quad PF,6-35 Mos 12/26/2019   Moderna Covid-19 Fall Seasonal Vaccine 42yrs & older 12/14/2023   PFIZER Comirnaty(Gray Top)Covid-19 Tri-Sucrose Vaccine 04/22/2021   PFIZER(Purple Top)SARS-COV-2 Vaccination 11/17/2019, 12/06/2019   PPD Test 10/13/2020   Pfizer Covid-19 Vaccine Bivalent Booster 3yrs & up 08/27/2021   Tdap 02/15/2022   Zoster Recombinant(Shingrix) 01/18/2018, 03/21/2018, 04/20/2018   Pertinent  Health Maintenance Due  Topic Date Due   DEXA SCAN  Never done   INFLUENZA VACCINE  Completed      09/21/2020    3:34 PM 09/26/2020    3:57 PM 08/07/2021    1:56 PM 02/15/2022    2:38 PM 03/17/2023    3:02 PM  Fall Risk  Falls in the past year?     0  Was there an injury with Fall?     0  Fall Risk Category Calculator     0  (RETIRED) Patient Fall Risk Level Low fall risk High fall risk Low fall risk Moderate fall risk   Patient at Risk for Falls Due to     No Fall Risks  Fall risk Follow up     Falls evaluation completed   Functional Status Survey:    Vitals:   12/23/23 1325  BP: Marland Kitchen)  158/81  Pulse: 62  Resp: 18  Temp: (!) 97.2 F (36.2 C)  SpO2: 98%  Weight: 144 lb 9.6 oz (65.6 kg)  Height: 5\' 4"  (1.626 m)    Body mass index is 24.82 kg/m. Physical Exam Vitals reviewed.  Constitutional:      General: She is not in acute distress. Eyes:     General:        Right eye: No discharge.        Left eye: No discharge.  Cardiovascular:     Rate and Rhythm: Normal rate and regular rhythm.     Pulses: Normal pulses.     Heart sounds: Normal heart sounds.  Pulmonary:     Effort: Pulmonary effort is normal.     Breath sounds: Normal breath sounds.  Abdominal:     Palpations: Abdomen is soft.  Musculoskeletal:     Cervical back: Neck supple.     Right lower leg: No edema.     Left lower leg: No edema.  Skin:    General: Skin is warm.     Capillary Refill: Capillary refill takes less than 2 seconds.  Neurological:     General: No focal deficit present.     Mental Status: She is alert and oriented to person, place, and time.     Gait: Gait abnormal.  Psychiatric:        Mood and Affect: Mood normal.     Labs reviewed: Recent Labs    06/13/23 0403 06/14/23 0357 06/15/23 0340 08/07/23 1245 09/10/23 1823 09/23/23 0000 09/29/23 0524 09/30/23 0614 10/01/23 0340 11/17/23 0000 11/21/23 0000 12/13/23 0000  NA 128* 131* 133*   < > 130*   < > 132* 134* 135 138 139 138  K 4.0 4.0 3.8   < > 4.3   < > 3.9 3.6 3.7 4.0 4.0 4.4  CL 99 100 102   < > 96*   < > 104 101 104 106 105 104  CO2 21* 25 25   < > 25   < > 25 23 25  24* 28* 29*  GLUCOSE 92 92 95   < > 91   < > 89 89 108*  --   --   --   BUN 14 12 11    < > 10   < > 18 16 12 15 19 17   CREATININE 1.36* 0.93 0.73   < > 1.02*   < > 1.00 0.79 0.78 0.7 0.9 0.8  CALCIUM 9.2 8.8* 8.7*   < > 10.3   < > 8.7* 9.0 9.0 9.3 9.2 9.2  MG 2.3 2.0 2.0  --  1.9  --   --   --   --   --   --   --   PHOS 3.9 3.3 3.6  --   --   --   --   --   --   --   --   --    < > = values in this interval not displayed.   Recent Labs    08/07/23 1245 09/08/23 1532 09/23/23 0000 09/26/23 2043  AST 21 21 19 29   ALT 15 18 14 23   ALKPHOS 60 76 76 82  BILITOT 1.0  0.7  --  0.7  PROT 6.6 7.3  --  6.6  ALBUMIN 4.0 4.3 3.7 3.6   Recent Labs    09/08/23 1532 09/10/23 1823 09/23/23 0000 09/26/23 2043 09/27/23 0607 09/29/23 0524 09/30/23 0614 10/01/23 0340  WBC 5.5   < >  5.3 5.2   < > 9.9 8.5 8.4  NEUTROABS 3.8  --  3,058.00 3.5  --   --   --   --   HGB 11.5*   < > 11.1* 11.8*   < > 9.3* 10.2* 10.8*  HCT 34.4*   < > 33* 36.3   < > 27.6* 30.0* 32.6*  MCV 94.2   < >  --  96.0   < > 92.3 91.7 90.8  PLT 239   < > 223 216   < > 172 192 227   < > = values in this interval not displayed.   Lab Results  Component Value Date   TSH 0.77 09/23/2023   No results found for: "HGBA1C" Lab Results  Component Value Date   CHOL 191 09/23/2023   HDL 56 09/23/2023   LDLCALC 112 09/23/2023   TRIG 118 09/23/2023    Significant Diagnostic Results in last 30 days:  No results found.  Assessment/Plan 1. Essential (primary) hypertension (Primary) - uncontrolled, goal < 150/90 - h/o hyponatremia> off sodium tablets - discontinue hydrochlorothiazide - start aldactone - cont metoprolol, losartan - blood pressures TID x 7 days> give report to PCP - bmp 02/27  2. Hyponatremia - see above  3. S/P right hip fracture - mechanical fall 11/25 - right hemiarthroplasty 11/26 - WBAT - ambulating well with walker - denies pain    Family/ staff Communication: plan discussed with patient and nurse  Labs/tests ordered:  bmp 02/27

## 2023-12-26 DIAGNOSIS — R41841 Cognitive communication deficit: Secondary | ICD-10-CM | POA: Diagnosis not present

## 2023-12-26 DIAGNOSIS — M25551 Pain in right hip: Secondary | ICD-10-CM | POA: Diagnosis not present

## 2023-12-26 DIAGNOSIS — M6281 Muscle weakness (generalized): Secondary | ICD-10-CM | POA: Diagnosis not present

## 2023-12-26 DIAGNOSIS — F331 Major depressive disorder, recurrent, moderate: Secondary | ICD-10-CM | POA: Diagnosis not present

## 2023-12-26 DIAGNOSIS — S72001D Fracture of unspecified part of neck of right femur, subsequent encounter for closed fracture with routine healing: Secondary | ICD-10-CM | POA: Diagnosis not present

## 2023-12-27 DIAGNOSIS — S72001D Fracture of unspecified part of neck of right femur, subsequent encounter for closed fracture with routine healing: Secondary | ICD-10-CM | POA: Diagnosis not present

## 2023-12-27 DIAGNOSIS — R41841 Cognitive communication deficit: Secondary | ICD-10-CM | POA: Diagnosis not present

## 2023-12-27 DIAGNOSIS — M25551 Pain in right hip: Secondary | ICD-10-CM | POA: Diagnosis not present

## 2023-12-27 DIAGNOSIS — F331 Major depressive disorder, recurrent, moderate: Secondary | ICD-10-CM | POA: Diagnosis not present

## 2023-12-27 DIAGNOSIS — R2681 Unsteadiness on feet: Secondary | ICD-10-CM | POA: Diagnosis not present

## 2023-12-27 DIAGNOSIS — M6281 Muscle weakness (generalized): Secondary | ICD-10-CM | POA: Diagnosis not present

## 2023-12-28 DIAGNOSIS — S72001D Fracture of unspecified part of neck of right femur, subsequent encounter for closed fracture with routine healing: Secondary | ICD-10-CM | POA: Diagnosis not present

## 2023-12-28 DIAGNOSIS — M6281 Muscle weakness (generalized): Secondary | ICD-10-CM | POA: Diagnosis not present

## 2023-12-28 DIAGNOSIS — R41841 Cognitive communication deficit: Secondary | ICD-10-CM | POA: Diagnosis not present

## 2023-12-28 DIAGNOSIS — M25551 Pain in right hip: Secondary | ICD-10-CM | POA: Diagnosis not present

## 2023-12-28 DIAGNOSIS — R2681 Unsteadiness on feet: Secondary | ICD-10-CM | POA: Diagnosis not present

## 2023-12-29 ENCOUNTER — Telehealth: Payer: Self-pay | Admitting: Internal Medicine

## 2023-12-29 DIAGNOSIS — M25551 Pain in right hip: Secondary | ICD-10-CM | POA: Diagnosis not present

## 2023-12-29 DIAGNOSIS — M6281 Muscle weakness (generalized): Secondary | ICD-10-CM | POA: Diagnosis not present

## 2023-12-29 DIAGNOSIS — Z0189 Encounter for other specified special examinations: Secondary | ICD-10-CM | POA: Diagnosis not present

## 2023-12-29 DIAGNOSIS — S72001D Fracture of unspecified part of neck of right femur, subsequent encounter for closed fracture with routine healing: Secondary | ICD-10-CM | POA: Diagnosis not present

## 2023-12-29 DIAGNOSIS — F331 Major depressive disorder, recurrent, moderate: Secondary | ICD-10-CM | POA: Diagnosis not present

## 2023-12-29 NOTE — Telephone Encounter (Signed)
 BMP shows sodium of 131 Off hydrochlorothiazide On Aldactone Reduce the dose to 12.5 mg FR of 2000 CC Check BP and Repeat BMP in 1 week

## 2023-12-30 DIAGNOSIS — S72001D Fracture of unspecified part of neck of right femur, subsequent encounter for closed fracture with routine healing: Secondary | ICD-10-CM | POA: Diagnosis not present

## 2023-12-30 DIAGNOSIS — R41841 Cognitive communication deficit: Secondary | ICD-10-CM | POA: Diagnosis not present

## 2023-12-30 DIAGNOSIS — M25551 Pain in right hip: Secondary | ICD-10-CM | POA: Diagnosis not present

## 2023-12-30 DIAGNOSIS — M6281 Muscle weakness (generalized): Secondary | ICD-10-CM | POA: Diagnosis not present

## 2023-12-30 DIAGNOSIS — F331 Major depressive disorder, recurrent, moderate: Secondary | ICD-10-CM | POA: Diagnosis not present

## 2024-01-02 DIAGNOSIS — M6281 Muscle weakness (generalized): Secondary | ICD-10-CM | POA: Diagnosis not present

## 2024-01-02 DIAGNOSIS — M25551 Pain in right hip: Secondary | ICD-10-CM | POA: Diagnosis not present

## 2024-01-02 DIAGNOSIS — R2681 Unsteadiness on feet: Secondary | ICD-10-CM | POA: Diagnosis not present

## 2024-01-02 DIAGNOSIS — F331 Major depressive disorder, recurrent, moderate: Secondary | ICD-10-CM | POA: Diagnosis not present

## 2024-01-02 DIAGNOSIS — S72001D Fracture of unspecified part of neck of right femur, subsequent encounter for closed fracture with routine healing: Secondary | ICD-10-CM | POA: Diagnosis not present

## 2024-01-03 DIAGNOSIS — R41841 Cognitive communication deficit: Secondary | ICD-10-CM | POA: Diagnosis not present

## 2024-01-04 DIAGNOSIS — R41841 Cognitive communication deficit: Secondary | ICD-10-CM | POA: Diagnosis not present

## 2024-01-04 DIAGNOSIS — M25551 Pain in right hip: Secondary | ICD-10-CM | POA: Diagnosis not present

## 2024-01-04 DIAGNOSIS — M6281 Muscle weakness (generalized): Secondary | ICD-10-CM | POA: Diagnosis not present

## 2024-01-04 DIAGNOSIS — S72001D Fracture of unspecified part of neck of right femur, subsequent encounter for closed fracture with routine healing: Secondary | ICD-10-CM | POA: Diagnosis not present

## 2024-01-04 DIAGNOSIS — R2681 Unsteadiness on feet: Secondary | ICD-10-CM | POA: Diagnosis not present

## 2024-01-04 DIAGNOSIS — F331 Major depressive disorder, recurrent, moderate: Secondary | ICD-10-CM | POA: Diagnosis not present

## 2024-01-05 DIAGNOSIS — M25551 Pain in right hip: Secondary | ICD-10-CM | POA: Diagnosis not present

## 2024-01-05 DIAGNOSIS — S72001D Fracture of unspecified part of neck of right femur, subsequent encounter for closed fracture with routine healing: Secondary | ICD-10-CM | POA: Diagnosis not present

## 2024-01-05 DIAGNOSIS — M6281 Muscle weakness (generalized): Secondary | ICD-10-CM | POA: Diagnosis not present

## 2024-01-05 DIAGNOSIS — I10 Essential (primary) hypertension: Secondary | ICD-10-CM | POA: Diagnosis not present

## 2024-01-05 DIAGNOSIS — F331 Major depressive disorder, recurrent, moderate: Secondary | ICD-10-CM | POA: Diagnosis not present

## 2024-01-06 DIAGNOSIS — R2681 Unsteadiness on feet: Secondary | ICD-10-CM | POA: Diagnosis not present

## 2024-01-06 DIAGNOSIS — S72001D Fracture of unspecified part of neck of right femur, subsequent encounter for closed fracture with routine healing: Secondary | ICD-10-CM | POA: Diagnosis not present

## 2024-01-06 DIAGNOSIS — M6281 Muscle weakness (generalized): Secondary | ICD-10-CM | POA: Diagnosis not present

## 2024-01-06 DIAGNOSIS — M25551 Pain in right hip: Secondary | ICD-10-CM | POA: Diagnosis not present

## 2024-01-06 DIAGNOSIS — R41841 Cognitive communication deficit: Secondary | ICD-10-CM | POA: Diagnosis not present

## 2024-01-09 DIAGNOSIS — S72001D Fracture of unspecified part of neck of right femur, subsequent encounter for closed fracture with routine healing: Secondary | ICD-10-CM | POA: Diagnosis not present

## 2024-01-09 DIAGNOSIS — M6281 Muscle weakness (generalized): Secondary | ICD-10-CM | POA: Diagnosis not present

## 2024-01-09 DIAGNOSIS — R2681 Unsteadiness on feet: Secondary | ICD-10-CM | POA: Diagnosis not present

## 2024-01-09 DIAGNOSIS — M25551 Pain in right hip: Secondary | ICD-10-CM | POA: Diagnosis not present

## 2024-01-11 DIAGNOSIS — F331 Major depressive disorder, recurrent, moderate: Secondary | ICD-10-CM | POA: Diagnosis not present

## 2024-01-11 DIAGNOSIS — R41841 Cognitive communication deficit: Secondary | ICD-10-CM | POA: Diagnosis not present

## 2024-01-11 DIAGNOSIS — R2681 Unsteadiness on feet: Secondary | ICD-10-CM | POA: Diagnosis not present

## 2024-01-11 DIAGNOSIS — S72001D Fracture of unspecified part of neck of right femur, subsequent encounter for closed fracture with routine healing: Secondary | ICD-10-CM | POA: Diagnosis not present

## 2024-01-11 DIAGNOSIS — M25551 Pain in right hip: Secondary | ICD-10-CM | POA: Diagnosis not present

## 2024-01-11 DIAGNOSIS — M6281 Muscle weakness (generalized): Secondary | ICD-10-CM | POA: Diagnosis not present

## 2024-01-12 DIAGNOSIS — M6281 Muscle weakness (generalized): Secondary | ICD-10-CM | POA: Diagnosis not present

## 2024-01-12 DIAGNOSIS — M25551 Pain in right hip: Secondary | ICD-10-CM | POA: Diagnosis not present

## 2024-01-12 DIAGNOSIS — F331 Major depressive disorder, recurrent, moderate: Secondary | ICD-10-CM | POA: Diagnosis not present

## 2024-01-12 DIAGNOSIS — S72001D Fracture of unspecified part of neck of right femur, subsequent encounter for closed fracture with routine healing: Secondary | ICD-10-CM | POA: Diagnosis not present

## 2024-01-13 ENCOUNTER — Other Ambulatory Visit: Payer: Self-pay | Admitting: Orthopedic Surgery

## 2024-01-13 DIAGNOSIS — S72001D Fracture of unspecified part of neck of right femur, subsequent encounter for closed fracture with routine healing: Secondary | ICD-10-CM | POA: Diagnosis not present

## 2024-01-13 DIAGNOSIS — F331 Major depressive disorder, recurrent, moderate: Secondary | ICD-10-CM | POA: Diagnosis not present

## 2024-01-13 DIAGNOSIS — R2681 Unsteadiness on feet: Secondary | ICD-10-CM | POA: Diagnosis not present

## 2024-01-13 DIAGNOSIS — M25551 Pain in right hip: Secondary | ICD-10-CM | POA: Diagnosis not present

## 2024-01-13 DIAGNOSIS — M6281 Muscle weakness (generalized): Secondary | ICD-10-CM | POA: Diagnosis not present

## 2024-01-13 DIAGNOSIS — F419 Anxiety disorder, unspecified: Secondary | ICD-10-CM

## 2024-01-13 MED ORDER — ALPRAZOLAM 0.5 MG PO TABS: 0.5000 mg | ORAL_TABLET | Freq: Two times a day (BID) | ORAL | 0 refills | Status: AC | PRN

## 2024-01-13 NOTE — Progress Notes (Signed)
 Marland Kitchen

## 2024-01-16 ENCOUNTER — Ambulatory Visit: Payer: Medicare PPO | Admitting: Orthopaedic Surgery

## 2024-01-16 DIAGNOSIS — M25551 Pain in right hip: Secondary | ICD-10-CM | POA: Diagnosis not present

## 2024-01-16 DIAGNOSIS — M6281 Muscle weakness (generalized): Secondary | ICD-10-CM | POA: Diagnosis not present

## 2024-01-16 DIAGNOSIS — R2681 Unsteadiness on feet: Secondary | ICD-10-CM | POA: Diagnosis not present

## 2024-01-16 DIAGNOSIS — S72001D Fracture of unspecified part of neck of right femur, subsequent encounter for closed fracture with routine healing: Secondary | ICD-10-CM | POA: Diagnosis not present

## 2024-01-17 DIAGNOSIS — F331 Major depressive disorder, recurrent, moderate: Secondary | ICD-10-CM | POA: Diagnosis not present

## 2024-01-17 DIAGNOSIS — M6281 Muscle weakness (generalized): Secondary | ICD-10-CM | POA: Diagnosis not present

## 2024-01-17 DIAGNOSIS — M25551 Pain in right hip: Secondary | ICD-10-CM | POA: Diagnosis not present

## 2024-01-17 DIAGNOSIS — S72001D Fracture of unspecified part of neck of right femur, subsequent encounter for closed fracture with routine healing: Secondary | ICD-10-CM | POA: Diagnosis not present

## 2024-01-18 DIAGNOSIS — M6281 Muscle weakness (generalized): Secondary | ICD-10-CM | POA: Diagnosis not present

## 2024-01-18 DIAGNOSIS — S72001D Fracture of unspecified part of neck of right femur, subsequent encounter for closed fracture with routine healing: Secondary | ICD-10-CM | POA: Diagnosis not present

## 2024-01-18 DIAGNOSIS — M25551 Pain in right hip: Secondary | ICD-10-CM | POA: Diagnosis not present

## 2024-01-18 DIAGNOSIS — F331 Major depressive disorder, recurrent, moderate: Secondary | ICD-10-CM | POA: Diagnosis not present

## 2024-01-19 ENCOUNTER — Non-Acute Institutional Stay: Payer: Self-pay | Admitting: Internal Medicine

## 2024-01-19 DIAGNOSIS — M6281 Muscle weakness (generalized): Secondary | ICD-10-CM | POA: Diagnosis not present

## 2024-01-19 DIAGNOSIS — R002 Palpitations: Secondary | ICD-10-CM | POA: Diagnosis not present

## 2024-01-19 DIAGNOSIS — F419 Anxiety disorder, unspecified: Secondary | ICD-10-CM | POA: Diagnosis not present

## 2024-01-19 DIAGNOSIS — I10 Essential (primary) hypertension: Secondary | ICD-10-CM

## 2024-01-19 DIAGNOSIS — E871 Hypo-osmolality and hyponatremia: Secondary | ICD-10-CM | POA: Diagnosis not present

## 2024-01-19 DIAGNOSIS — F332 Major depressive disorder, recurrent severe without psychotic features: Secondary | ICD-10-CM

## 2024-01-19 DIAGNOSIS — Z8781 Personal history of (healed) traumatic fracture: Secondary | ICD-10-CM | POA: Diagnosis not present

## 2024-01-19 DIAGNOSIS — M25551 Pain in right hip: Secondary | ICD-10-CM | POA: Diagnosis not present

## 2024-01-19 DIAGNOSIS — N390 Urinary tract infection, site not specified: Secondary | ICD-10-CM | POA: Diagnosis not present

## 2024-01-19 DIAGNOSIS — R2681 Unsteadiness on feet: Secondary | ICD-10-CM | POA: Diagnosis not present

## 2024-01-19 DIAGNOSIS — S72001D Fracture of unspecified part of neck of right femur, subsequent encounter for closed fracture with routine healing: Secondary | ICD-10-CM | POA: Diagnosis not present

## 2024-01-19 NOTE — Progress Notes (Unsigned)
 [facility] of Service:  ALF [phone])  Provider:   Code Status: Full Code Goals of Care:     12/23/2023    1:55 PM  Advanced Directives  Does Patient Have a Medical Advance Directive? No  Does patient want to make changes to medical advance directive? No - Patient declined     Chief Complaint  Patient presents with   Care Management    HPI: Patient is a 83 y.o. female seen today for medical management of chronic diseases.    Patient is readmit to AL Patient was in the hospital from 11/25 to 12/3 after a fall and sustaining closed right hip fracture.  Patient underwent right hemiarthroplasty on 11/26 She was discharged to rehab in [place] And now was admitted in [place]  She also has a history of acute depression with weight loss, recurrent UTIs, hypertension, hyponatremia, severe anxiety, hypothyroidism  Patient has done very well since she has been on Remeron and Zyprexa. She is sleeping good has gained almost 15 pounds since I last saw her before her fall. She uses walker to walk.  She says that she did have 1 fall since she has been in AL.  But did not have any injuries.  She did not have a walker with her at the time. Her bowels are moving well. Her only complaint today was some palpitations that she has noticed.  She also gets very anxious and wants to make sure her Xanax is as needed twice a day  Past Medical History:  Diagnosis Date   Adenomatous colon polyp    Adenomatous polyp of colon 12/03/2004   6mm   Anxiety    Chronic headaches    Chronic insomnia    Depression    Diverticulosis    External hemorrhoids    Hyperlipidemia    Hypertension    Hyperthyroidism    Hypothyroidism    Umbilical hernia     Past Surgical History:  Procedure Laterality Date   ABDOMINAL HYSTERECTOMY  1975   APPENDECTOMY  1975   COLONOSCOPY  03/03/2010,12/03/2004   ESOPHAGOGASTRODUODENOSCOPY  12/16/11   Dr. Stan Head   HIP ARTHROPLASTY Right 09/27/2023    Procedure: ARTHROPLASTY BIPOLAR HIP (HEMIARTHROPLASTY);  Surgeon: Kathryne Hitch, MD;  Location: [facility] OR;  Service: Orthopedics;  Laterality: Right;   OOPHORECTOMY     spinal lipoma removed  2006   TOTAL ABDOMINAL HYSTERECTOMY      Allergies  Allergen Reactions   Hydrocodone-Acetaminophen Rash    Vicodin=REACTION: rash  Not listed on the MAR   Iodinated Contrast Media Hives and Rash    Per patient she has developed hives and itching in upper trunk of body after last 2 CT scans once she got homes that she has just treated at home with benadryl. She has not told anyone until now. States she was able to bear it  Other reaction(s): Rash Per patient she has developed hives and itching in upper trunk of body after last 2 CT scans once she got homes that she has just treated at home with benadryl. She has not told anyone until now. States she was able to bear it   Not listed on the [facility]    Sulfa Antibiotics Rash    REACTION: rash Other reaction(s): Unknown   Codeine     Unknown reaction  Not listed on the MAR    Sulfonamide Derivatives     REACTION: rash   Terbinafine  Unknown reaction  Not listed on the [facility]    Benazepril Cough    Not listed on the [facility]     Outpatient Encounter Medications as of 01/19/2024  Medication Sig   ALPRAZolam (XANAX) 0.5 MG tablet Take 1 tablet (0.5 mg total) by mouth 2 (two) times daily as needed for up to 14 days for anxiety.   aspirin 81 MG chewable tablet Chew 1 tablet (81 mg total) by mouth 2 (two) times daily.   busPIRone (BUSPAR) 15 MG tablet Take 15 mg by mouth 2 (two) times daily. Second listing in [facility] states take three times daily   Docusate Sodium 100 MG capsule Take 100 mg by mouth as needed for constipation.   DULoxetine (CYMBALTA) 30 MG capsule Take 90 mg by mouth daily.   gabapentin (NEURONTIN) 100 MG capsule Take 200 mg by mouth 2 (two) times daily.   levothyroxine (SYNTHROID) 100 MCG tablet Take 100 mcg by mouth  daily.   loperamide (IMODIUM A-D) 2 MG tablet Take 2 mg by mouth every 8 (eight) hours as needed for diarrhea or loose stools.   losartan (COZAAR) 50 MG tablet Take 50 mg by mouth 2 (two) times daily.   methenamine (HIPREX) 1 g tablet Take 1 g by mouth 2 (two) times daily.   metoprolol succinate (TOPROL XL) 25 MG 24 hr tablet Take 1 tablet (25 mg total) by mouth daily.   mirtazapine (REMERON) 15 MG tablet Take 15 mg by mouth at bedtime.   OLANZapine (ZYPREXA) 5 MG tablet Take 5 mg by mouth daily.   ondansetron (ZOFRAN) 4 MG tablet Take 4 mg by mouth daily as needed for nausea or vomiting.   polyethylene glycol (MIRALAX) 17 g packet Take 17 g by mouth daily.   saccharomyces boulardii (FLORASTOR) 250 MG capsule Take 250 mg by mouth 2 (two) times daily.   senna (SENOKOT) 8.6 MG TABS tablet Take 1 tablet by mouth daily. May also take 2 tablets by mouth as needed for constipation   senna (SENOKOT) 8.6 MG TABS tablet Take 1 tablet by mouth daily.   spironolactone (ALDACTONE) 25 MG tablet Take 1 tablet (25 mg total) by mouth daily. (Patient taking differently: Take 12.5 mg by mouth daily.)   No facility-administered encounter medications on file as of 01/19/2024.    Review of Systems:  Review of Systems  Constitutional:  Negative for activity change and appetite change.  HENT: Negative.    Respiratory:  Negative for cough and shortness of breath.   Cardiovascular:  Positive for leg swelling.  Gastrointestinal:  Negative for constipation.  Genitourinary: Negative.   Musculoskeletal:  Positive for gait problem. Negative for arthralgias and myalgias.  Skin: Negative.   Neurological:  Negative for dizziness and weakness.  Psychiatric/Behavioral:  Negative for confusion, dysphoric mood and sleep disturbance. The patient is nervous/anxious.     Health Maintenance  Topic Date Due   Pneumonia Vaccine 35+ Years old (1 of 2 - PCV) Never done   DEXA SCAN  Never done   COVID-19 Vaccine (6 - 2024-25  season) 06/12/2024   Medicare Annual Wellness (AWV)  11/28/2024   DTaP/Tdap/Td (2 - Td or Tdap) 02/16/2032   INFLUENZA VACCINE  Completed   Zoster Vaccines- Shingrix  Completed   HPV VACCINES  Aged Out   Hepatitis C Screening  Discontinued    Physical Exam: Vitals:   01/19/24 1933  BP: (!) 164/81  Pulse: 70  Resp: 18  Temp: (!) 97.2 F (36.2 C)  Weight: 142 lb (64.4 kg)   Body mass index is 24.37 kg/m. Physical Exam Vitals reviewed.  Constitutional:      Appearance: Normal appearance.  HENT:     Head: Normocephalic.     Nose: Nose normal.     Mouth/Throat:     Mouth: Mucous membranes are moist.     Pharynx: Oropharynx is clear.  Eyes:     Pupils: Pupils are equal, round, and reactive to light.  Cardiovascular:     Rate and Rhythm: Normal rate and regular rhythm.     Pulses: Normal pulses.     Heart sounds: Normal heart sounds. No murmur heard. Pulmonary:     Effort: Pulmonary effort is normal.     Breath sounds: Normal breath sounds.  Abdominal:     General: Abdomen is flat. Bowel sounds are normal.     Palpations: Abdomen is soft.  Musculoskeletal:        General: No swelling.     Cervical back: Neck supple.     Comments: Mild Swelling Bilateral Left More then Right  Skin:    General: Skin is warm.  Neurological:     General: No focal deficit present.     Mental Status: She is alert and oriented to person, place, and time.  Psychiatric:        Mood and Affect: Mood normal.        Thought Content: Thought content normal.     Labs reviewed: Basic Metabolic Panel: Recent Labs    06/11/23 1643 06/11/23 2119 06/13/23 0403 06/14/23 0357 06/15/23 0340 08/07/23 1245 09/10/23 1823 09/10/23 1823 09/23/23 0000 09/26/23 2043 09/29/23 0524 09/30/23 0614 10/01/23 0340 11/17/23 0000 11/21/23 0000 12/13/23 0000  NA  --    < > 128* 131* 133*   < > 130*   < > 125*   < > 132* 134* 135 138 139 138  K  --    < > 4.0 4.0 3.8   < > 4.3  --  4.5   < > 3.9 3.6  3.7 4.0 4.0 4.4  CL  --    < > 99 100 102   < > 96*  --  92*   < > 104 101 104 106 105 104  CO2  --    < > 21* 25 25   < > 25  --  27*   < > 25 23 25  24* 28* 29*  GLUCOSE  --    < > 92 92 95   < > 91  --   --    < > 89 89 108*  --   --   --   BUN  --    < > 14 12 11    < > 10   < > 15   < > 18 16 12 15 19 17   CREATININE  --    < > 1.36* 0.93 0.73   < > 1.02*   < > 1.0   < > 1.00 0.79 0.78 0.7 0.9 0.8  CALCIUM  --    < > 9.2 8.8* 8.7*   < > 10.3  --  9.1   < > 8.7* 9.0 9.0 9.3 9.2 9.2  MG  --    < > 2.3 2.0 2.0  --  1.9  --   --   --   --   --   --   --   --   --   PHOS  --   --  3.9 3.3 3.6  --   --   --   --   --   --   --   --   --   --   --   TSH 1.460  --   --   --   --   --   --   --  0.77  --   --   --   --   --   --   --    < > = values in this interval not displayed.   Liver Function Tests: Recent Labs    08/07/23 1245 09/08/23 1532 09/23/23 0000 09/26/23 2043  AST 21 21 19 29   ALT 15 18 14 23   ALKPHOS 60 76 76 82  BILITOT 1.0 0.7  --  0.7  PROT 6.6 7.3  --  6.6  ALBUMIN 4.0 4.3 3.7 3.6   Recent Labs    08/07/23 1245  LIPASE 36   No results for input(s): "AMMONIA" in the last 8760 hours. CBC: Recent Labs    09/08/23 1532 09/10/23 1823 09/23/23 0000 09/26/23 2043 09/27/23 0607 09/29/23 0524 09/30/23 0614 10/01/23 0340  WBC 5.5   < > 5.3 5.2   < > 9.9 8.5 8.4  NEUTROABS 3.8  --  3,058.00 3.5  --   --   --   --   HGB 11.5*   < > 11.1* 11.8*   < > 9.3* 10.2* 10.8*  HCT 34.4*   < > 33* 36.3   < > 27.6* 30.0* 32.6*  MCV 94.2   < >  --  96.0   < > 92.3 91.7 90.8  PLT 239   < > 223 216   < > 172 192 227   < > = values in this interval not displayed.   Lipid Panel: Recent Labs    09/23/23 0000  CHOL 191  HDL 56  LDLCALC 112  TRIG 161   No results found for: "HGBA1C"  Procedures since last visit: No results found.  Assessment/Plan 1. Essential (primary) hypertension (Primary) BP running High Cannot change  toprol due to some Low HR Start her on  Norvasc 2.5 mg   On Aldactone,Toprol and Cozaar  2. Hyponatremia  She is on FR 2000 cc Repeat BMP  3. Anxiety Will Continue on Xanax low dose BID PRN for her Panic Attacks She is also on Buspar 4. S/P right hip fracture Doing well NO Pain Follow with Ortho Will write for order as don't see Appointment  5. Major depressive disorder, recurrent severe without psychotic features (HCC) On Cymbalta,Remeron and Zyprexa Doing well  6. Recurrent urinary tract infection Hiprex  7 Palpitations EKG done Showed First AV block Continue to follow with Toprol Most likely due to xanax 8 Acquired hypothyroidism  Normal in 11/24 9 Chronic constipation  Senna and Miralax 10  Labs/tests ordered:  BMP in 4 weeks Next appt:  Visit date not found

## 2024-01-20 DIAGNOSIS — M6281 Muscle weakness (generalized): Secondary | ICD-10-CM | POA: Diagnosis not present

## 2024-01-20 DIAGNOSIS — R2681 Unsteadiness on feet: Secondary | ICD-10-CM | POA: Diagnosis not present

## 2024-01-20 DIAGNOSIS — S72001D Fracture of unspecified part of neck of right femur, subsequent encounter for closed fracture with routine healing: Secondary | ICD-10-CM | POA: Diagnosis not present

## 2024-01-20 DIAGNOSIS — F331 Major depressive disorder, recurrent, moderate: Secondary | ICD-10-CM | POA: Diagnosis not present

## 2024-01-20 DIAGNOSIS — M25551 Pain in right hip: Secondary | ICD-10-CM | POA: Diagnosis not present

## 2024-01-21 ENCOUNTER — Encounter: Payer: Self-pay | Admitting: Internal Medicine

## 2024-01-23 DIAGNOSIS — R2681 Unsteadiness on feet: Secondary | ICD-10-CM | POA: Diagnosis not present

## 2024-01-23 DIAGNOSIS — S72001D Fracture of unspecified part of neck of right femur, subsequent encounter for closed fracture with routine healing: Secondary | ICD-10-CM | POA: Diagnosis not present

## 2024-01-23 DIAGNOSIS — M25551 Pain in right hip: Secondary | ICD-10-CM | POA: Diagnosis not present

## 2024-01-23 DIAGNOSIS — M6281 Muscle weakness (generalized): Secondary | ICD-10-CM | POA: Diagnosis not present

## 2024-01-24 DIAGNOSIS — M25551 Pain in right hip: Secondary | ICD-10-CM | POA: Diagnosis not present

## 2024-01-24 DIAGNOSIS — S72001D Fracture of unspecified part of neck of right femur, subsequent encounter for closed fracture with routine healing: Secondary | ICD-10-CM | POA: Diagnosis not present

## 2024-01-24 DIAGNOSIS — M6281 Muscle weakness (generalized): Secondary | ICD-10-CM | POA: Diagnosis not present

## 2024-01-24 DIAGNOSIS — F331 Major depressive disorder, recurrent, moderate: Secondary | ICD-10-CM | POA: Diagnosis not present

## 2024-01-25 DIAGNOSIS — M25551 Pain in right hip: Secondary | ICD-10-CM | POA: Diagnosis not present

## 2024-01-25 DIAGNOSIS — R2681 Unsteadiness on feet: Secondary | ICD-10-CM | POA: Diagnosis not present

## 2024-01-25 DIAGNOSIS — M6281 Muscle weakness (generalized): Secondary | ICD-10-CM | POA: Diagnosis not present

## 2024-01-25 DIAGNOSIS — S72001D Fracture of unspecified part of neck of right femur, subsequent encounter for closed fracture with routine healing: Secondary | ICD-10-CM | POA: Diagnosis not present

## 2024-01-26 DIAGNOSIS — F331 Major depressive disorder, recurrent, moderate: Secondary | ICD-10-CM | POA: Diagnosis not present

## 2024-01-26 DIAGNOSIS — N1831 Chronic kidney disease, stage 3a: Secondary | ICD-10-CM | POA: Diagnosis not present

## 2024-01-26 DIAGNOSIS — S72001D Fracture of unspecified part of neck of right femur, subsequent encounter for closed fracture with routine healing: Secondary | ICD-10-CM | POA: Diagnosis not present

## 2024-01-26 DIAGNOSIS — G609 Hereditary and idiopathic neuropathy, unspecified: Secondary | ICD-10-CM | POA: Diagnosis not present

## 2024-01-26 DIAGNOSIS — M25551 Pain in right hip: Secondary | ICD-10-CM | POA: Diagnosis not present

## 2024-01-26 DIAGNOSIS — I10 Essential (primary) hypertension: Secondary | ICD-10-CM | POA: Diagnosis not present

## 2024-01-26 DIAGNOSIS — M6281 Muscle weakness (generalized): Secondary | ICD-10-CM | POA: Diagnosis not present

## 2024-01-26 LAB — CBC AND DIFFERENTIAL
HCT: 37 (ref 36–46)
Hemoglobin: 12.1 (ref 12.0–16.0)
Platelets: 322 10*3/uL (ref 150–400)
WBC: 7.7

## 2024-01-26 LAB — CBC: RBC: 4.24 (ref 3.87–5.11)

## 2024-01-27 DIAGNOSIS — R2681 Unsteadiness on feet: Secondary | ICD-10-CM | POA: Diagnosis not present

## 2024-01-27 DIAGNOSIS — M6281 Muscle weakness (generalized): Secondary | ICD-10-CM | POA: Diagnosis not present

## 2024-01-27 DIAGNOSIS — M25551 Pain in right hip: Secondary | ICD-10-CM | POA: Diagnosis not present

## 2024-01-27 DIAGNOSIS — S72001D Fracture of unspecified part of neck of right femur, subsequent encounter for closed fracture with routine healing: Secondary | ICD-10-CM | POA: Diagnosis not present

## 2024-01-27 DIAGNOSIS — F331 Major depressive disorder, recurrent, moderate: Secondary | ICD-10-CM | POA: Diagnosis not present

## 2024-01-30 DIAGNOSIS — M6281 Muscle weakness (generalized): Secondary | ICD-10-CM | POA: Diagnosis not present

## 2024-01-30 DIAGNOSIS — M25551 Pain in right hip: Secondary | ICD-10-CM | POA: Diagnosis not present

## 2024-01-30 DIAGNOSIS — F331 Major depressive disorder, recurrent, moderate: Secondary | ICD-10-CM | POA: Diagnosis not present

## 2024-01-30 DIAGNOSIS — R2681 Unsteadiness on feet: Secondary | ICD-10-CM | POA: Diagnosis not present

## 2024-01-30 DIAGNOSIS — S72001D Fracture of unspecified part of neck of right femur, subsequent encounter for closed fracture with routine healing: Secondary | ICD-10-CM | POA: Diagnosis not present

## 2024-02-01 DIAGNOSIS — M25551 Pain in right hip: Secondary | ICD-10-CM | POA: Diagnosis not present

## 2024-02-01 DIAGNOSIS — S72001D Fracture of unspecified part of neck of right femur, subsequent encounter for closed fracture with routine healing: Secondary | ICD-10-CM | POA: Diagnosis not present

## 2024-02-01 DIAGNOSIS — M6281 Muscle weakness (generalized): Secondary | ICD-10-CM | POA: Diagnosis not present

## 2024-02-01 DIAGNOSIS — R2681 Unsteadiness on feet: Secondary | ICD-10-CM | POA: Diagnosis not present

## 2024-02-01 DIAGNOSIS — F331 Major depressive disorder, recurrent, moderate: Secondary | ICD-10-CM | POA: Diagnosis not present

## 2024-02-03 DIAGNOSIS — M25551 Pain in right hip: Secondary | ICD-10-CM | POA: Diagnosis not present

## 2024-02-03 DIAGNOSIS — R2681 Unsteadiness on feet: Secondary | ICD-10-CM | POA: Diagnosis not present

## 2024-02-03 DIAGNOSIS — M6281 Muscle weakness (generalized): Secondary | ICD-10-CM | POA: Diagnosis not present

## 2024-02-03 DIAGNOSIS — S72001D Fracture of unspecified part of neck of right femur, subsequent encounter for closed fracture with routine healing: Secondary | ICD-10-CM | POA: Diagnosis not present

## 2024-02-03 DIAGNOSIS — N390 Urinary tract infection, site not specified: Secondary | ICD-10-CM | POA: Diagnosis not present

## 2024-02-03 DIAGNOSIS — F331 Major depressive disorder, recurrent, moderate: Secondary | ICD-10-CM | POA: Diagnosis not present

## 2024-02-05 ENCOUNTER — Encounter: Payer: Self-pay | Admitting: Sports Medicine

## 2024-02-05 NOTE — Progress Notes (Signed)
 Received call from the nurse that urinalysis is positive Instructed nurse to start Augmentin 500 mg tab twice daily for 5 days. Pending urine cultures

## 2024-02-06 ENCOUNTER — Other Ambulatory Visit: Payer: Self-pay | Admitting: Orthopedic Surgery

## 2024-02-06 DIAGNOSIS — F331 Major depressive disorder, recurrent, moderate: Secondary | ICD-10-CM | POA: Diagnosis not present

## 2024-02-06 DIAGNOSIS — S72001D Fracture of unspecified part of neck of right femur, subsequent encounter for closed fracture with routine healing: Secondary | ICD-10-CM | POA: Diagnosis not present

## 2024-02-06 DIAGNOSIS — F419 Anxiety disorder, unspecified: Secondary | ICD-10-CM

## 2024-02-06 DIAGNOSIS — R2681 Unsteadiness on feet: Secondary | ICD-10-CM | POA: Diagnosis not present

## 2024-02-06 DIAGNOSIS — M6281 Muscle weakness (generalized): Secondary | ICD-10-CM | POA: Diagnosis not present

## 2024-02-06 DIAGNOSIS — M25551 Pain in right hip: Secondary | ICD-10-CM | POA: Diagnosis not present

## 2024-02-06 NOTE — Telephone Encounter (Signed)
 Patient has request refill on medication Xanax that's not on medication list. Medication pend and sent to PCP Mahlon Gammon, MD

## 2024-02-08 DIAGNOSIS — M6281 Muscle weakness (generalized): Secondary | ICD-10-CM | POA: Diagnosis not present

## 2024-02-08 DIAGNOSIS — F331 Major depressive disorder, recurrent, moderate: Secondary | ICD-10-CM | POA: Diagnosis not present

## 2024-02-08 DIAGNOSIS — S72001D Fracture of unspecified part of neck of right femur, subsequent encounter for closed fracture with routine healing: Secondary | ICD-10-CM | POA: Diagnosis not present

## 2024-02-08 DIAGNOSIS — M25551 Pain in right hip: Secondary | ICD-10-CM | POA: Diagnosis not present

## 2024-02-10 DIAGNOSIS — R2681 Unsteadiness on feet: Secondary | ICD-10-CM | POA: Diagnosis not present

## 2024-02-10 DIAGNOSIS — S72001D Fracture of unspecified part of neck of right femur, subsequent encounter for closed fracture with routine healing: Secondary | ICD-10-CM | POA: Diagnosis not present

## 2024-02-10 DIAGNOSIS — M6281 Muscle weakness (generalized): Secondary | ICD-10-CM | POA: Diagnosis not present

## 2024-02-10 DIAGNOSIS — M25551 Pain in right hip: Secondary | ICD-10-CM | POA: Diagnosis not present

## 2024-02-10 DIAGNOSIS — F331 Major depressive disorder, recurrent, moderate: Secondary | ICD-10-CM | POA: Diagnosis not present

## 2024-02-12 DIAGNOSIS — S72001D Fracture of unspecified part of neck of right femur, subsequent encounter for closed fracture with routine healing: Secondary | ICD-10-CM | POA: Diagnosis not present

## 2024-02-12 DIAGNOSIS — M6281 Muscle weakness (generalized): Secondary | ICD-10-CM | POA: Diagnosis not present

## 2024-02-12 DIAGNOSIS — R2681 Unsteadiness on feet: Secondary | ICD-10-CM | POA: Diagnosis not present

## 2024-02-12 DIAGNOSIS — M25551 Pain in right hip: Secondary | ICD-10-CM | POA: Diagnosis not present

## 2024-02-13 ENCOUNTER — Non-Acute Institutional Stay: Payer: Self-pay | Admitting: Orthopedic Surgery

## 2024-02-13 ENCOUNTER — Encounter: Payer: Self-pay | Admitting: Orthopedic Surgery

## 2024-02-13 DIAGNOSIS — R002 Palpitations: Secondary | ICD-10-CM | POA: Diagnosis not present

## 2024-02-13 DIAGNOSIS — M6281 Muscle weakness (generalized): Secondary | ICD-10-CM | POA: Diagnosis not present

## 2024-02-13 DIAGNOSIS — R300 Dysuria: Secondary | ICD-10-CM

## 2024-02-13 DIAGNOSIS — F331 Major depressive disorder, recurrent, moderate: Secondary | ICD-10-CM | POA: Diagnosis not present

## 2024-02-13 DIAGNOSIS — S72001D Fracture of unspecified part of neck of right femur, subsequent encounter for closed fracture with routine healing: Secondary | ICD-10-CM | POA: Diagnosis not present

## 2024-02-13 DIAGNOSIS — Z0189 Encounter for other specified special examinations: Secondary | ICD-10-CM | POA: Diagnosis not present

## 2024-02-13 DIAGNOSIS — F411 Generalized anxiety disorder: Secondary | ICD-10-CM

## 2024-02-13 DIAGNOSIS — R2681 Unsteadiness on feet: Secondary | ICD-10-CM | POA: Diagnosis not present

## 2024-02-13 DIAGNOSIS — M25551 Pain in right hip: Secondary | ICD-10-CM | POA: Diagnosis not present

## 2024-02-13 MED ORDER — ASPIRIN 81 MG PO CHEW: 81.0000 mg | CHEWABLE_TABLET | Freq: Once | ORAL | Status: AC

## 2024-02-13 NOTE — Progress Notes (Signed)
 Location:  [facility] Room Number: 33/A Place of Service:  ALF [phone]) Provider:  Arnetha Bhat, NP   Marguerite Shiley, MD  Patient Care Team: Marguerite Shiley, MD as PCP - General (Internal Medicine) Lenise Quince, MD as PCP - Cardiology (Cardiology) Duke, Warren Haber, PA as Physician Assistant (Cardiology)  Extended Emergency Contact Information Primary Emergency Contact: [facility] Phone: [phone] Relation: Son  Code Status:  DNR Goals of care: Advanced Directive information    12/23/2023    1:55 PM  Advanced Directives  Does Patient Have a Medical Advance Directive? No  Does patient want to make changes to medical advance directive? No - Patient declined     Chief Complaint  Patient presents with   Acute Visit    dysuria    HPI:  Pt is a 83 y.o. female seen today for acute visit due to ongoing dysuria.   She recently moved to AL at [facility] 09/12/2023. PMH: HTN, HLD, migraines, IBS, thyroid disease, neuropathy, CKD, recurrent UTI, hyponatremia, s/p right hip fracture 09/26/2023 s/p right hemiarthroplasty 09/27/2023, major depression, anxiety and insomnia.   H/o recurrent UTI with ESBL, past use of trimethoprim in past. 09/26/2023 trimethoprim discontinued last hospitalization 09/26/2023. 04/04 she c/o intermittent dysuria. UA positive for leukocytes and WBC, urine culture noted flora only. On call provider started her on Augmentin 500/125 mg po BID x 5 days> completed 04/11. Today, she reports dysuria has returned. Nursing/patient admits she is drinking fluids well but is on a 2000 cc restriction due to hyponatremia. Drinks cranberry juice every morning.   No recent panic attacks, remains on Xanax and buspar for anxiety.   Admits to palpitations. Do not occur daily. Recent EKG showed SR with AV block. Per chart appears this has been a issue since 2013. She saw cardiology/ Dr. Audery Blazing in 2021, palpitations were stable and  advised to continue beta blocker. She remains on metoprolol. Denies chest pain and shortness of breath today.   Past Medical History:  Diagnosis Date   Adenomatous colon polyp    Adenomatous polyp of colon 12/03/2004   6mm   Anxiety    Chronic headaches    Chronic insomnia    Depression    Diverticulosis    External hemorrhoids    Hyperlipidemia    Hypertension    Hyperthyroidism    Hypothyroidism    Umbilical hernia    Past Surgical History:  Procedure Laterality Date   ABDOMINAL HYSTERECTOMY  1975   APPENDECTOMY  1975   COLONOSCOPY  03/03/2010,12/03/2004   ESOPHAGOGASTRODUODENOSCOPY  12/16/11   Dr. Loy Ruff   HIP ARTHROPLASTY Right 09/27/2023   Procedure: ARTHROPLASTY BIPOLAR HIP (HEMIARTHROPLASTY);  Surgeon: Arnie Lao, MD;  Location: [facility] OR;  Service: Orthopedics;  Laterality: Right;   OOPHORECTOMY     spinal lipoma removed  2006   TOTAL ABDOMINAL HYSTERECTOMY      Allergies  Allergen Reactions   Hydrocodone-Acetaminophen Rash    Vicodin=REACTION: rash  Not listed on the MAR   Iodinated Contrast Media Hives and Rash    Per patient she has developed hives and itching in upper trunk of body after last 2 CT scans once she got homes that she has just treated at home with benadryl. She has not told anyone until now. States she was able to bear it  Other reaction(s): Rash Per patient she has developed hives and itching in upper trunk of body after last 2 CT scans once she  got homes that she has just treated at home with benadryl. She has not told anyone until now. States she was able to bear it   Not listed on the [facility]    Sulfa Antibiotics Rash    REACTION: rash Other reaction(s): Unknown   Codeine     Unknown reaction  Not listed on the [facility]    Sulfonamide Derivatives     REACTION: rash   Terbinafine     Unknown reaction  Not listed on the [facility]    Benazepril Cough    Not listed on the [facility]     Outpatient Encounter Medications as of 02/13/2024   Medication Sig   aspirin 81 MG chewable tablet Chew 1 tablet (81 mg total) by mouth 2 (two) times daily. (Patient taking differently: Chew 81 mg by mouth once.)   busPIRone (BUSPAR) 15 MG tablet Take 15 mg by mouth 2 (two) times daily. Second listing in [facility] states take three times daily   Docusate Sodium 100 MG capsule Take 100 mg by mouth as needed for constipation.   DULoxetine (CYMBALTA) 30 MG capsule Take 90 mg by mouth daily.   gabapentin (NEURONTIN) 100 MG capsule Take 200 mg by mouth 2 (two) times daily.   levothyroxine (SYNTHROID) 100 MCG tablet Take 100 mcg by mouth daily.   loperamide (IMODIUM A-D) 2 MG tablet Take 2 mg by mouth every 8 (eight) hours as needed for diarrhea or loose stools.   losartan (COZAAR) 50 MG tablet Take 50 mg by mouth 2 (two) times daily.   methenamine (HIPREX) 1 g tablet Take 1 g by mouth 2 (two) times daily.   metoprolol succinate (TOPROL XL) 25 MG 24 hr tablet Take 1 tablet (25 mg total) by mouth daily.   mirtazapine (REMERON) 15 MG tablet Take 15 mg by mouth at bedtime.   OLANZapine (ZYPREXA) 5 MG tablet Take 5 mg by mouth daily.   ondansetron (ZOFRAN) 4 MG tablet Take 4 mg by mouth daily as needed for nausea or vomiting.   polyethylene glycol (MIRALAX) 17 g packet Take 17 g by mouth daily.   saccharomyces boulardii (FLORASTOR) 250 MG capsule Take 250 mg by mouth 2 (two) times daily.   senna (SENOKOT) 8.6 MG TABS tablet Take 1 tablet by mouth daily. May also take 2 tablets by mouth as needed for constipation   senna (SENOKOT) 8.6 MG TABS tablet Take 1 tablet by mouth daily.   spironolactone (ALDACTONE) 25 MG tablet Take 1 tablet (25 mg total) by mouth daily. (Patient taking differently: Take 12.5 mg by mouth daily.)   No facility-administered encounter medications on file as of 02/13/2024.    Review of Systems  Constitutional:  Negative for fatigue and fever.  Respiratory:  Negative for cough and shortness of breath.   Cardiovascular:   Positive for palpitations. Negative for chest pain and leg swelling.  Gastrointestinal:  Negative for abdominal distention and abdominal pain.  Genitourinary:  Positive for dysuria and frequency. Negative for hematuria and vaginal bleeding.  Musculoskeletal:  Positive for gait problem.  Skin:  Negative for wound.  Neurological:  Negative for dizziness and headaches.  Psychiatric/Behavioral:  Positive for dysphoric mood. Negative for sleep disturbance. The patient is nervous/anxious.     Immunization History  Administered Date(s) Administered   Fluad Quad(high Dose 65+) 12/07/2023   Influenza, High Dose Seasonal PF 08/04/2017, 07/27/2018, 07/27/2020   Influenza,inj,Quad PF,6-35 Mos 12/26/2019   Moderna Covid-19 Fall Seasonal Vaccine 37yrs & older 12/14/2023  PFIZER Comirnaty(Gray Top)Covid-19 Tri-Sucrose Vaccine 04/22/2021   PFIZER(Purple Top)SARS-COV-2 Vaccination 11/17/2019, 12/06/2019   PPD Test 10/13/2020   Pfizer Covid-19 Vaccine Bivalent Booster 20yrs & up 08/27/2021   Tdap 02/15/2022   Zoster Recombinant(Shingrix) 01/18/2018, 03/21/2018, 04/20/2018   Pertinent  Health Maintenance Due  Topic Date Due   DEXA SCAN  Never done   INFLUENZA VACCINE  06/01/2024      09/21/2020    3:34 PM 09/26/2020    3:57 PM 08/07/2021    1:56 PM 02/15/2022    2:38 PM 03/17/2023    3:02 PM  Fall Risk  Falls in the past year?     0  Was there an injury with Fall?     0  Fall Risk Category Calculator     0  (RETIRED) Patient Fall Risk Level Low fall risk High fall risk Low fall risk Moderate fall risk   Patient at Risk for Falls Due to     No Fall Risks  Fall risk Follow up     Falls evaluation completed   Functional Status Survey:    Vitals:   02/13/24 1017  BP: 130/74  Pulse: 65  Resp: 20  Temp: 97.6 F (36.4 C)  SpO2: 97%  Weight: 142 lb (64.4 kg)  Height: 5\' 4"  (1.626 m)   Body mass index is 24.37 kg/m. Physical Exam Vitals reviewed.  Constitutional:      General: She is  not in acute distress. HENT:     Head: Normocephalic.  Eyes:     General:        Right eye: No discharge.        Left eye: No discharge.  Cardiovascular:     Rate and Rhythm: Normal rate and regular rhythm.     Pulses: Normal pulses.     Heart sounds: Normal heart sounds. No murmur heard.    No gallop.  Pulmonary:     Effort: Pulmonary effort is normal. No respiratory distress.     Breath sounds: Normal breath sounds. No wheezing or rales.  Abdominal:     Palpations: Abdomen is soft.     Tenderness: There is no right CVA tenderness or left CVA tenderness.  Musculoskeletal:     Cervical back: Neck supple.     Right lower leg: No edema.     Left lower leg: No edema.  Skin:    General: Skin is warm.     Capillary Refill: Capillary refill takes less than 2 seconds.  Neurological:     General: No focal deficit present.     Mental Status: She is alert and oriented to person, place, and time.     Gait: Gait abnormal.  Psychiatric:        Mood and Affect: Mood normal.     Labs reviewed: Recent Labs    06/13/23 0403 06/14/23 0357 06/15/23 0340 08/07/23 1245 09/10/23 1823 09/23/23 0000 09/29/23 0524 09/30/23 0614 10/01/23 0340 11/17/23 0000 11/21/23 0000 12/13/23 0000  NA 128* 131* 133*   < > 130*   < > 132* 134* 135 138 139 138  K 4.0 4.0 3.8   < > 4.3   < > 3.9 3.6 3.7 4.0 4.0 4.4  CL 99 100 102   < > 96*   < > 104 101 104 106 105 104  CO2 21* 25 25   < > 25   < > 25 23 25  24* 28* 29*  GLUCOSE 92 92 95   < > 91   < >  89 89 108*  --   --   --   BUN 14 12 11    < > 10   < > 18 16 12 15 19 17   CREATININE 1.36* 0.93 0.73   < > 1.02*   < > 1.00 0.79 0.78 0.7 0.9 0.8  CALCIUM 9.2 8.8* 8.7*   < > 10.3   < > 8.7* 9.0 9.0 9.3 9.2 9.2  MG 2.3 2.0 2.0  --  1.9  --   --   --   --   --   --   --   PHOS 3.9 3.3 3.6  --   --   --   --   --   --   --   --   --    < > = values in this interval not displayed.   Recent Labs    08/07/23 1245 09/08/23 1532 09/23/23 0000  09/26/23 2043  AST 21 21 19 29   ALT 15 18 14 23   ALKPHOS 60 76 76 82  BILITOT 1.0 0.7  --  0.7  PROT 6.6 7.3  --  6.6  ALBUMIN 4.0 4.3 3.7 3.6   Recent Labs    09/08/23 1532 09/10/23 1823 09/23/23 0000 09/26/23 2043 09/27/23 0607 09/29/23 0524 09/30/23 0614 10/01/23 0340  WBC 5.5   < > 5.3 5.2   < > 9.9 8.5 8.4  NEUTROABS 3.8  --  3,058.00 3.5  --   --   --   --   HGB 11.5*   < > 11.1* 11.8*   < > 9.3* 10.2* 10.8*  HCT 34.4*   < > 33* 36.3   < > 27.6* 30.0* 32.6*  MCV 94.2   < >  --  96.0   < > 92.3 91.7 90.8  PLT 239   < > 223 216   < > 172 192 227   < > = values in this interval not displayed.   Lab Results  Component Value Date   TSH 0.77 09/23/2023   No results found for: "HGBA1C" Lab Results  Component Value Date   CHOL 191 09/23/2023   HDL 56 09/23/2023   LDLCALC 112 09/23/2023   TRIG 118 09/23/2023    Significant Diagnostic Results in last 30 days:  No results found.  Assessment/Plan 1. Dysuria (Primary) - ongoing - h/o recurrent UTI/ ESBL/ past use of trimethoprim - 04/04 UA + leukocytes and WBC, culture noted flora only - completed Augmentin 500/125 mg po BID x 5 days> completed 04/11 - symptoms returned  - cont daily cranberry juice - repeat UA/culture  2. 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 GAD (generalized anxiety disorder) - no recent panic  - cont xanax and buspar    Family/ staff Communication: plan discussed with patient and nurse  Labs/tests ordered:  repeat UA/culture

## 2024-02-15 DIAGNOSIS — M6281 Muscle weakness (generalized): Secondary | ICD-10-CM | POA: Diagnosis not present

## 2024-02-15 DIAGNOSIS — S72001D Fracture of unspecified part of neck of right femur, subsequent encounter for closed fracture with routine healing: Secondary | ICD-10-CM | POA: Diagnosis not present

## 2024-02-15 DIAGNOSIS — M25551 Pain in right hip: Secondary | ICD-10-CM | POA: Diagnosis not present

## 2024-02-15 DIAGNOSIS — R2681 Unsteadiness on feet: Secondary | ICD-10-CM | POA: Diagnosis not present

## 2024-02-16 DIAGNOSIS — S72001D Fracture of unspecified part of neck of right femur, subsequent encounter for closed fracture with routine healing: Secondary | ICD-10-CM | POA: Diagnosis not present

## 2024-02-16 DIAGNOSIS — D649 Anemia, unspecified: Secondary | ICD-10-CM | POA: Diagnosis not present

## 2024-02-16 DIAGNOSIS — I10 Essential (primary) hypertension: Secondary | ICD-10-CM | POA: Diagnosis not present

## 2024-02-16 DIAGNOSIS — M6281 Muscle weakness (generalized): Secondary | ICD-10-CM | POA: Diagnosis not present

## 2024-02-16 DIAGNOSIS — F331 Major depressive disorder, recurrent, moderate: Secondary | ICD-10-CM | POA: Diagnosis not present

## 2024-02-16 DIAGNOSIS — M25551 Pain in right hip: Secondary | ICD-10-CM | POA: Diagnosis not present

## 2024-02-16 LAB — BASIC METABOLIC PANEL WITH GFR
BUN: 18 (ref 4–21)
CO2: 25 — AB (ref 13–22)
Chloride: 98 — AB (ref 99–108)
Creatinine: 0.9 (ref 0.5–1.1)
Glucose: 87
Potassium: 5 meq/L (ref 3.5–5.1)
Sodium: 130 — AB (ref 137–147)

## 2024-02-16 LAB — CBC AND DIFFERENTIAL
HCT: 39 (ref 36–46)
Hemoglobin: 12.3 (ref 12.0–16.0)
Neutrophils Absolute: 4249
Platelets: 276 10*3/uL (ref 150–400)
WBC: 7.3

## 2024-02-16 LAB — CBC: RBC: 4.31 (ref 3.87–5.11)

## 2024-02-16 LAB — COMPREHENSIVE METABOLIC PANEL WITH GFR
Calcium: 9.6 (ref 8.7–10.7)
eGFR: 66

## 2024-02-17 DIAGNOSIS — M6281 Muscle weakness (generalized): Secondary | ICD-10-CM | POA: Diagnosis not present

## 2024-02-17 DIAGNOSIS — M25551 Pain in right hip: Secondary | ICD-10-CM | POA: Diagnosis not present

## 2024-02-17 DIAGNOSIS — F331 Major depressive disorder, recurrent, moderate: Secondary | ICD-10-CM | POA: Diagnosis not present

## 2024-02-17 DIAGNOSIS — R2681 Unsteadiness on feet: Secondary | ICD-10-CM | POA: Diagnosis not present

## 2024-02-17 DIAGNOSIS — S72001D Fracture of unspecified part of neck of right femur, subsequent encounter for closed fracture with routine healing: Secondary | ICD-10-CM | POA: Diagnosis not present

## 2024-02-20 DIAGNOSIS — F331 Major depressive disorder, recurrent, moderate: Secondary | ICD-10-CM | POA: Diagnosis not present

## 2024-02-20 DIAGNOSIS — M25551 Pain in right hip: Secondary | ICD-10-CM | POA: Diagnosis not present

## 2024-02-20 DIAGNOSIS — R2681 Unsteadiness on feet: Secondary | ICD-10-CM | POA: Diagnosis not present

## 2024-02-20 DIAGNOSIS — S72001D Fracture of unspecified part of neck of right femur, subsequent encounter for closed fracture with routine healing: Secondary | ICD-10-CM | POA: Diagnosis not present

## 2024-02-20 DIAGNOSIS — M6281 Muscle weakness (generalized): Secondary | ICD-10-CM | POA: Diagnosis not present

## 2024-02-22 DIAGNOSIS — M25551 Pain in right hip: Secondary | ICD-10-CM | POA: Diagnosis not present

## 2024-02-22 DIAGNOSIS — S72001D Fracture of unspecified part of neck of right femur, subsequent encounter for closed fracture with routine healing: Secondary | ICD-10-CM | POA: Diagnosis not present

## 2024-02-22 DIAGNOSIS — R2681 Unsteadiness on feet: Secondary | ICD-10-CM | POA: Diagnosis not present

## 2024-02-22 DIAGNOSIS — M6281 Muscle weakness (generalized): Secondary | ICD-10-CM | POA: Diagnosis not present

## 2024-02-23 DIAGNOSIS — M25551 Pain in right hip: Secondary | ICD-10-CM | POA: Diagnosis not present

## 2024-02-23 DIAGNOSIS — M6281 Muscle weakness (generalized): Secondary | ICD-10-CM | POA: Diagnosis not present

## 2024-02-23 DIAGNOSIS — S72001D Fracture of unspecified part of neck of right femur, subsequent encounter for closed fracture with routine healing: Secondary | ICD-10-CM | POA: Diagnosis not present

## 2024-02-23 DIAGNOSIS — F331 Major depressive disorder, recurrent, moderate: Secondary | ICD-10-CM | POA: Diagnosis not present

## 2024-02-24 DIAGNOSIS — S72001D Fracture of unspecified part of neck of right femur, subsequent encounter for closed fracture with routine healing: Secondary | ICD-10-CM | POA: Diagnosis not present

## 2024-02-24 DIAGNOSIS — M6281 Muscle weakness (generalized): Secondary | ICD-10-CM | POA: Diagnosis not present

## 2024-02-24 DIAGNOSIS — R2681 Unsteadiness on feet: Secondary | ICD-10-CM | POA: Diagnosis not present

## 2024-02-24 DIAGNOSIS — M25551 Pain in right hip: Secondary | ICD-10-CM | POA: Diagnosis not present

## 2024-02-24 DIAGNOSIS — F331 Major depressive disorder, recurrent, moderate: Secondary | ICD-10-CM | POA: Diagnosis not present

## 2024-02-27 DIAGNOSIS — F331 Major depressive disorder, recurrent, moderate: Secondary | ICD-10-CM | POA: Diagnosis not present

## 2024-02-27 DIAGNOSIS — M6281 Muscle weakness (generalized): Secondary | ICD-10-CM | POA: Diagnosis not present

## 2024-02-27 DIAGNOSIS — S72001D Fracture of unspecified part of neck of right femur, subsequent encounter for closed fracture with routine healing: Secondary | ICD-10-CM | POA: Diagnosis not present

## 2024-02-27 DIAGNOSIS — M25551 Pain in right hip: Secondary | ICD-10-CM | POA: Diagnosis not present

## 2024-02-28 DIAGNOSIS — M25551 Pain in right hip: Secondary | ICD-10-CM | POA: Diagnosis not present

## 2024-02-28 DIAGNOSIS — R2681 Unsteadiness on feet: Secondary | ICD-10-CM | POA: Diagnosis not present

## 2024-02-28 DIAGNOSIS — M6281 Muscle weakness (generalized): Secondary | ICD-10-CM | POA: Diagnosis not present

## 2024-02-28 DIAGNOSIS — S72001D Fracture of unspecified part of neck of right femur, subsequent encounter for closed fracture with routine healing: Secondary | ICD-10-CM | POA: Diagnosis not present

## 2024-02-29 DIAGNOSIS — S72001D Fracture of unspecified part of neck of right femur, subsequent encounter for closed fracture with routine healing: Secondary | ICD-10-CM | POA: Diagnosis not present

## 2024-02-29 DIAGNOSIS — M25551 Pain in right hip: Secondary | ICD-10-CM | POA: Diagnosis not present

## 2024-02-29 DIAGNOSIS — M6281 Muscle weakness (generalized): Secondary | ICD-10-CM | POA: Diagnosis not present

## 2024-02-29 DIAGNOSIS — F331 Major depressive disorder, recurrent, moderate: Secondary | ICD-10-CM | POA: Diagnosis not present

## 2024-03-01 DIAGNOSIS — R2681 Unsteadiness on feet: Secondary | ICD-10-CM | POA: Diagnosis not present

## 2024-03-01 DIAGNOSIS — M6281 Muscle weakness (generalized): Secondary | ICD-10-CM | POA: Diagnosis not present

## 2024-03-01 DIAGNOSIS — S72001D Fracture of unspecified part of neck of right femur, subsequent encounter for closed fracture with routine healing: Secondary | ICD-10-CM | POA: Diagnosis not present

## 2024-03-01 DIAGNOSIS — M25551 Pain in right hip: Secondary | ICD-10-CM | POA: Diagnosis not present

## 2024-03-02 DIAGNOSIS — M6281 Muscle weakness (generalized): Secondary | ICD-10-CM | POA: Diagnosis not present

## 2024-03-02 DIAGNOSIS — R2681 Unsteadiness on feet: Secondary | ICD-10-CM | POA: Diagnosis not present

## 2024-03-02 DIAGNOSIS — M25551 Pain in right hip: Secondary | ICD-10-CM | POA: Diagnosis not present

## 2024-03-02 DIAGNOSIS — S72001D Fracture of unspecified part of neck of right femur, subsequent encounter for closed fracture with routine healing: Secondary | ICD-10-CM | POA: Diagnosis not present

## 2024-03-02 DIAGNOSIS — F331 Major depressive disorder, recurrent, moderate: Secondary | ICD-10-CM | POA: Diagnosis not present

## 2024-03-03 DIAGNOSIS — M25551 Pain in right hip: Secondary | ICD-10-CM | POA: Diagnosis not present

## 2024-03-03 DIAGNOSIS — R2681 Unsteadiness on feet: Secondary | ICD-10-CM | POA: Diagnosis not present

## 2024-03-03 DIAGNOSIS — M6281 Muscle weakness (generalized): Secondary | ICD-10-CM | POA: Diagnosis not present

## 2024-03-03 DIAGNOSIS — S72001D Fracture of unspecified part of neck of right femur, subsequent encounter for closed fracture with routine healing: Secondary | ICD-10-CM | POA: Diagnosis not present

## 2024-03-05 ENCOUNTER — Encounter: Payer: Self-pay | Admitting: Orthopedic Surgery

## 2024-03-05 ENCOUNTER — Other Ambulatory Visit: Payer: Self-pay | Admitting: Orthopedic Surgery

## 2024-03-05 ENCOUNTER — Non-Acute Institutional Stay: Payer: Self-pay | Admitting: Orthopedic Surgery

## 2024-03-05 DIAGNOSIS — F411 Generalized anxiety disorder: Secondary | ICD-10-CM | POA: Diagnosis not present

## 2024-03-05 DIAGNOSIS — S72001D Fracture of unspecified part of neck of right femur, subsequent encounter for closed fracture with routine healing: Secondary | ICD-10-CM | POA: Diagnosis not present

## 2024-03-05 DIAGNOSIS — I10 Essential (primary) hypertension: Secondary | ICD-10-CM

## 2024-03-05 DIAGNOSIS — Z8781 Personal history of (healed) traumatic fracture: Secondary | ICD-10-CM | POA: Diagnosis not present

## 2024-03-05 DIAGNOSIS — E871 Hypo-osmolality and hyponatremia: Secondary | ICD-10-CM

## 2024-03-05 DIAGNOSIS — M6281 Muscle weakness (generalized): Secondary | ICD-10-CM | POA: Diagnosis not present

## 2024-03-05 DIAGNOSIS — F332 Major depressive disorder, recurrent severe without psychotic features: Secondary | ICD-10-CM | POA: Diagnosis not present

## 2024-03-05 DIAGNOSIS — N390 Urinary tract infection, site not specified: Secondary | ICD-10-CM

## 2024-03-05 DIAGNOSIS — M25551 Pain in right hip: Secondary | ICD-10-CM | POA: Diagnosis not present

## 2024-03-05 DIAGNOSIS — F419 Anxiety disorder, unspecified: Secondary | ICD-10-CM

## 2024-03-05 DIAGNOSIS — R002 Palpitations: Secondary | ICD-10-CM | POA: Diagnosis not present

## 2024-03-05 DIAGNOSIS — F331 Major depressive disorder, recurrent, moderate: Secondary | ICD-10-CM | POA: Diagnosis not present

## 2024-03-05 MED ORDER — ALPRAZOLAM 0.5 MG PO TABS: 0.5000 mg | ORAL_TABLET | Freq: Every day | ORAL | 0 refills | Status: DC | PRN

## 2024-03-05 NOTE — Progress Notes (Signed)
 Location:  [facility] Room Number: 14/A Place of Service:  ALF [phone]) Provider:  Arnetha Bhat, NP   Marguerite Shiley, MD  Patient Care Team: Marguerite Shiley, MD as PCP - General (Internal Medicine) Lenise Quince, MD as PCP - Cardiology (Cardiology) [facility], Warren Haber, PA as Physician Assistant (Cardiology)  Extended Emergency Contact Information Primary Emergency Contact: [facility] Phone: [phone] Relation: Son  Code Status:  Full code Goals of care: Advanced Directive information    12/23/2023    1:55 PM  Advanced Directives  Does Patient Have a Medical Advance Directive? No  Does patient want to make changes to medical advance directive? No - Patient declined     Chief Complaint  Patient presents with   Medical Management of Chronic Issues    HPI:  Pt is a 83 y.o. female seen today for medical management of chronic diseases.  She recently moved to AL at [facility] 09/12/2023. PMH: HTN, HLD, migraines, IBS, thyroid  disease, neuropathy, CKD, recurrent UTI, hyponatremia, s/p right hip fracture 09/26/2023 s/p right hemiarthroplasty 09/27/2023, major depression, anxiety and insomnia.   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 Loy Ruff   HIP ARTHROPLASTY Right 09/27/2023   Procedure: ARTHROPLASTY BIPOLAR HIP (HEMIARTHROPLASTY);  Surgeon: Arnie Lao, MD;  Location: [facility] OR;  Service: Orthopedics;  Laterality: Right;   OOPHORECTOMY     spinal lipoma removed  2006   TOTAL ABDOMINAL HYSTERECTOMY      Allergies  Allergen Reactions   Hydrocodone -Acetaminophen  Rash    Vicodin=REACTION: rash  Not listed on the MAR   Iodinated Contrast Media Hives and Rash    Per patient she has developed hives and itching in upper trunk of body after last 2 CT scans once she got homes that she has just treated at home with benadryl . She has not told anyone until now. States she was able to bear it  Other reaction(s): Rash Per patient she has developed hives and itching in upper trunk of body after last 2 CT scans once she got homes that she has just treated at home with benadryl . She has not told anyone until now. States she was able to bear it   Not listed on the [facility]    Sulfa Antibiotics Rash    REACTION: rash Other reaction(s): Unknown   Codeine     Unknown reaction  Not listed on the MAR    Sulfonamide Derivatives     REACTION: rash   Terbinafine     Unknown reaction  Not listed on the [facility]    Benazepril  Cough    Not listed on the [facility]     Outpatient Encounter Medications as of 03/05/2024  Medication Sig  busPIRone  (BUSPAR ) 15 MG tablet Take 15 mg by mouth 2 (two) times daily. Second listing in [facility] states take three times daily   Docusate Sodium  100 MG capsule Take 100 mg by mouth as needed for constipation.   DULoxetine  (CYMBALTA ) 30 MG capsule Take 90 mg by mouth daily.   gabapentin  (NEURONTIN ) 100 MG capsule Take 200 mg by mouth 2 (two) times daily.   levothyroxine  (SYNTHROID ) 100 MCG tablet Take 100 mcg by mouth daily.    loperamide (IMODIUM A-D) 2 MG tablet Take 2 mg by mouth every 8 (eight) hours as needed for diarrhea or loose stools.   losartan  (COZAAR ) 50 MG tablet Take 50 mg by mouth 2 (two) times daily.   magnesium hydroxide (MILK OF MAGNESIA) 400 MG/5ML suspension Take 30 mLs by mouth daily as needed for mild constipation.   methenamine  (HIPREX ) 1 g tablet Take 1 g by mouth 2 (two) times daily.   metoprolol  succinate (TOPROL  XL) 25 MG 24 hr tablet Take 1 tablet (25 mg total) by mouth daily.   mirtazapine  (REMERON ) 15 MG tablet Take 15 mg by mouth at bedtime.   OLANZapine  (ZYPREXA ) 5 MG tablet Take 5 mg by mouth daily.   ondansetron  (ZOFRAN ) 4 MG tablet Take 4 mg by mouth daily as needed for nausea or vomiting.   polyethylene glycol (MIRALAX ) 17 g packet Take 17 g by mouth daily.   saccharomyces boulardii (FLORASTOR) 250 MG capsule Take 250 mg by mouth 2 (two) times daily.   senna (SENOKOT) 8.6 MG TABS tablet Take 1 tablet by mouth daily. May also take 2 tablets by mouth as needed for constipation   senna (SENOKOT) 8.6 MG TABS tablet Take 1 tablet by mouth daily.   No facility-administered encounter medications on file as of 03/05/2024.    Review of Systems  Constitutional:  Negative for fatigue and fever.  HENT:  Negative for sore throat and trouble swallowing.   Eyes:  Negative for visual disturbance.  Respiratory:  Negative for cough and shortness of breath.   Cardiovascular:  Positive for palpitations. Negative for chest pain and leg swelling.  Gastrointestinal:  Negative for abdominal distention, abdominal pain, constipation, diarrhea and nausea.  Genitourinary:  Negative for dysuria, frequency, hematuria and vaginal bleeding.  Musculoskeletal:  Positive for arthralgias and gait problem.  Skin:  Negative for wound.  Neurological:  Negative for dizziness, weakness and light-headedness.  Psychiatric/Behavioral:  Positive for confusion and dysphoric mood. Negative for sleep disturbance. The patient  is nervous/anxious.     Immunization History  Administered Date(s) Administered   Fluad Quad(high Dose 65+) 12/07/2023   Influenza, High Dose Seasonal PF 08/04/2017, 07/27/2018, 07/27/2020   Influenza,inj,Quad PF,6-35 Mos 12/26/2019   Moderna Covid-19 Fall Seasonal Vaccine 60yrs & older 12/14/2023   PFIZER Comirnaty(Gray Top)Covid-19 Tri-Sucrose Vaccine 04/22/2021   PFIZER(Purple Top)SARS-COV-2 Vaccination 11/17/2019, 12/06/2019   PPD Test 10/13/2020   Pfizer Covid-19 Vaccine Bivalent Booster 50yrs & up 08/27/2021   Tdap 02/15/2022   Zoster Recombinant(Shingrix) 01/18/2018, 03/21/2018, 04/20/2018   Pertinent  Health Maintenance Due  Topic Date Due   DEXA SCAN  Never done   INFLUENZA VACCINE  06/01/2024      09/26/2020    3:57 PM 08/07/2021    1:56 PM 02/15/2022    2:38 PM 03/17/2023    3:02 PM 02/13/2024    1:41 PM  Fall Risk  Falls in the past year?    0 1  Was there an injury with Fall?    0  1  Fall Risk Category Calculator    0 3  (RETIRED) Patient Fall Risk Level High fall risk Low fall risk Moderate fall risk    Patient at Risk for Falls Due to    No Fall Risks History of fall(s)  Fall risk Follow up    Falls evaluation completed Falls evaluation completed;Education provided   Functional Status Survey:    Vitals:   03/05/24 1515  BP: 130/67  Pulse: 61  Resp: 18  Temp: (!) 97.5 F (36.4 C)  SpO2: 95%  Weight: 146 lb 3.2 oz (66.3 kg)  Height: 5\' 4"  (1.626 m)   Body mass index is 25.1 kg/m. Physical Exam Vitals reviewed.  Constitutional:      General: She is not in acute distress. HENT:     Head: Normocephalic.  Eyes:     General:        Right eye: No discharge.        Left eye: No discharge.  Cardiovascular:     Rate and Rhythm: Normal rate and regular rhythm.     Pulses: Normal pulses.     Heart sounds: Normal heart sounds.  Pulmonary:     Effort: Pulmonary effort is normal. No respiratory distress.     Breath sounds: Normal breath sounds. No  wheezing.  Abdominal:     General: Bowel sounds are normal. There is no distension.     Palpations: Abdomen is soft.     Tenderness: There is no abdominal tenderness.  Musculoskeletal:     Cervical back: Neck supple.     Right lower leg: No edema.     Left lower leg: No edema.  Skin:    General: Skin is warm.     Capillary Refill: Capillary refill takes less than 2 seconds.  Neurological:     General: No focal deficit present.     Mental Status: She is alert. Mental status is at baseline.     Gait: Gait abnormal.     Comments: walker  Psychiatric:        Mood and Affect: Mood normal.     Labs reviewed: Recent Labs    06/13/23 0403 06/14/23 0357 06/15/23 0340 08/07/23 1245 09/10/23 1823 09/23/23 0000 09/29/23 0524 09/30/23 0614 10/01/23 0340 11/17/23 0000 11/21/23 0000 12/13/23 0000  NA 128* 131* 133*   < > 130*   < > 132* 134* 135 138 139 138  K 4.0 4.0 3.8   < > 4.3   < > 3.9 3.6 3.7 4.0 4.0 4.4  CL 99 100 102   < > 96*   < > 104 101 104 106 105 104  CO2 21* 25 25   < > 25   < > 25 23 25  24* 28* 29*  GLUCOSE 92 92 95   < > 91   < > 89 89 108*  --   --   --   BUN 14 12 11    < > 10   < > 18 16 12 15 19 17   CREATININE 1.36* 0.93 0.73   < > 1.02*   < > 1.00 0.79 0.78 0.7 0.9 0.8  CALCIUM  9.2 8.8* 8.7*   < > 10.3   < > 8.7* 9.0 9.0 9.3 9.2 9.2  MG 2.3 2.0 2.0  --  1.9  --   --   --   --   --   --   --   PHOS 3.9 3.3 3.6  --   --   --   --   --   --   --   --   --    < > =  values in this interval not displayed.   Recent Labs    08/07/23 1245 09/08/23 1532 09/23/23 0000 09/26/23 2043  AST 21 21 19 29   ALT 15 18 14 23   ALKPHOS 60 76 76 82  BILITOT 1.0 0.7  --  0.7  PROT 6.6 7.3  --  6.6  ALBUMIN  4.0 4.3 3.7 3.6   Recent Labs    09/08/23 1532 09/10/23 1823 09/23/23 0000 09/26/23 2043 09/27/23 0607 09/29/23 0524 09/30/23 0614 10/01/23 0340  WBC 5.5   < > 5.3 5.2   < > 9.9 8.5 8.4  NEUTROABS 3.8  --  3,058.00 3.5  --   --   --   --   HGB 11.5*   < >  11.1* 11.8*   < > 9.3* 10.2* 10.8*  HCT 34.4*   < > 33* 36.3   < > 27.6* 30.0* 32.6*  MCV 94.2   < >  --  96.0   < > 92.3 91.7 90.8  PLT 239   < > 223 216   < > 172 192 227   < > = values in this interval not displayed.   Lab Results  Component Value Date   TSH 0.77 09/23/2023   No results found for: "HGBA1C" Lab Results  Component Value Date   CHOL 191 09/23/2023   HDL 56 09/23/2023   LDLCALC 112 09/23/2023   TRIG 118 09/23/2023    Significant Diagnostic Results in last 30 days:  No results found.  Assessment/Plan 1. Essential (primary) hypertension (Primary) - controlled with metoprolol  and amlodipine   2. Hyponatremia - Na+ 130 02/16/2024 - strict FR 2000 cc/day - repeat bmp  3. Major depressive disorder, recurrent severe without psychotic features (HCC) - no mood changes - supportive family - weight stable - adjusted well in AL - cont Cymbalta /Remeron /Zyprexa   4. GAD (generalized anxiety disorder) - no recent panic - anxious demeanor - failed GDR - cont Buspar  and xanax   5. Recurrent urinary tract infection - h/o ESBL and trimethoprim  use  - last urine culture < 49,000 cfu/mL E.coli - cont cranberry juice QAM  6. 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 S/P right hip fracture - 11/26 right hemiarthroplasty - ambulated with walker - cont tylenol  prn    Family/ staff Communication: plan discussed with patient and nurse  Labs/tests ordered:  bmp 05/08

## 2024-03-07 ENCOUNTER — Telehealth: Payer: Self-pay

## 2024-03-07 ENCOUNTER — Other Ambulatory Visit: Payer: Self-pay | Admitting: Orthopedic Surgery

## 2024-03-07 DIAGNOSIS — S72001D Fracture of unspecified part of neck of right femur, subsequent encounter for closed fracture with routine healing: Secondary | ICD-10-CM | POA: Diagnosis not present

## 2024-03-07 DIAGNOSIS — F419 Anxiety disorder, unspecified: Secondary | ICD-10-CM

## 2024-03-07 DIAGNOSIS — F331 Major depressive disorder, recurrent, moderate: Secondary | ICD-10-CM | POA: Diagnosis not present

## 2024-03-07 DIAGNOSIS — M25551 Pain in right hip: Secondary | ICD-10-CM | POA: Diagnosis not present

## 2024-03-07 DIAGNOSIS — M6281 Muscle weakness (generalized): Secondary | ICD-10-CM | POA: Diagnosis not present

## 2024-03-07 MED ORDER — ALPRAZOLAM 0.5 MG PO TABS: 0.5000 mg | ORAL_TABLET | Freq: Every day | ORAL | 0 refills | Status: DC | PRN

## 2024-03-07 NOTE — Telephone Encounter (Signed)
 Copied from CRM [id number]. Topic: General - Other >> Mar 07, 2024  3:30 PM Brynn Caras wrote: Reason for CRM: The patient's son, Angelicamarie Carnathan is requesting a callback from Dr. Venice Gillis only on behalf of his mother for an unspecified reason.  Callback [phone]    Dr.Gupta please advise if you would like for me to investigate this request to speak with you

## 2024-03-08 ENCOUNTER — Non-Acute Institutional Stay: Payer: Self-pay | Admitting: Internal Medicine

## 2024-03-08 ENCOUNTER — Encounter: Payer: Self-pay | Admitting: Internal Medicine

## 2024-03-08 DIAGNOSIS — Z8781 Personal history of (healed) traumatic fracture: Secondary | ICD-10-CM | POA: Diagnosis not present

## 2024-03-08 DIAGNOSIS — F332 Major depressive disorder, recurrent severe without psychotic features: Secondary | ICD-10-CM

## 2024-03-08 DIAGNOSIS — I10 Essential (primary) hypertension: Secondary | ICD-10-CM | POA: Diagnosis not present

## 2024-03-08 LAB — BASIC METABOLIC PANEL WITH GFR
BUN: 16 (ref 4–21)
CO2: 25 — AB (ref 13–22)
Chloride: 97 — AB (ref 99–108)
Creatinine: 1 (ref 0.5–1.1)
Glucose: 116
Potassium: 4.5 meq/L (ref 3.5–5.1)
Sodium: 130 — AB (ref 137–147)

## 2024-03-08 LAB — COMPREHENSIVE METABOLIC PANEL WITH GFR
Calcium: 9.6 (ref 8.7–10.7)
eGFR: 60

## 2024-03-08 NOTE — Telephone Encounter (Signed)
Thanks! I already talked to him.

## 2024-03-08 NOTE — Progress Notes (Signed)
 Location:  [facility] Room Number: 14 A Place of Service:  ALF [phone]) Provider:  Marguerite Shiley, MD   Marguerite Shiley, MD  Patient Care Team: Marguerite Shiley, MD as PCP - General (Internal Medicine) Lenise Quince, MD as PCP - Cardiology (Cardiology) Duke, Warren Haber, PA as Physician Assistant (Cardiology)  Extended Emergency Contact Information Primary Emergency Contact: [facility] Phone: [phone] Relation: Son  Code Status:  (Advance Directives) Goals of care: Advanced Directive information    12/23/2023    1:55 PM  Advanced Directives  Does Patient Have a Medical Advance Directive? No  Does patient want to make changes to medical advance directive? No - Patient declined     Chief Complaint  Patient presents with   Acute Visit    HPI:  Pt is a 83 y.o. female seen today for an acute visit for Acute depression  Lives in [place] in [facility] [place]  She also has a history of acute depression with weight loss, recurrent UTIs, hypertension, hyponatremia, severe anxiety, hypothyroidism   Patient had been stable on Remeron  and Zyprexa  Also on Cymbalta  but gets hyponatremia on other SSRI Had gained weight  But recently her husband's son and the staff had noticed the patient is again getting depressed.  Patient told me today she feels down does not want to do things does not want to see her friends  She continues to sleep well.  Her appetite stays well.  She she is continuing to do her ADLs in [place]  Past Medical History:  Diagnosis Date   Adenomatous colon polyp    Adenomatous polyp of colon 12/03/2004   6mm   Anxiety    Chronic headaches    Chronic insomnia    Depression    Diverticulosis    External hemorrhoids    Hyperlipidemia    Hypertension    Hyperthyroidism    Hypothyroidism    Umbilical hernia    Past Surgical History:  Procedure Laterality Date   ABDOMINAL HYSTERECTOMY  1975   APPENDECTOMY  1975   COLONOSCOPY   03/03/2010,12/03/2004   ESOPHAGOGASTRODUODENOSCOPY  12/16/11   Dr. Loy Ruff   HIP ARTHROPLASTY Right 09/27/2023   Procedure: ARTHROPLASTY BIPOLAR HIP (HEMIARTHROPLASTY);  Surgeon: Arnie Lao, MD;  Location: [facility] OR;  Service: Orthopedics;  Laterality: Right;   OOPHORECTOMY     spinal lipoma removed  2006   TOTAL ABDOMINAL HYSTERECTOMY      Allergies  Allergen Reactions   Hydrocodone -Acetaminophen  Rash    Vicodin=REACTION: rash  Not listed on the MAR   Iodinated Contrast Media Hives and Rash    Per patient she has developed hives and itching in upper trunk of body after last 2 CT scans once she got homes that she has just treated at home with benadryl . She has not told anyone until now. States she was able to bear it  Other reaction(s): Rash Per patient she has developed hives and itching in upper trunk of body after last 2 CT scans once she got homes that she has just treated at home with benadryl . She has not told anyone until now. States she was able to bear it   Not listed on the [facility]    Sulfa Antibiotics Rash    REACTION: rash Other reaction(s): Unknown   Codeine     Unknown reaction  Not listed on the MAR    Sulfonamide Derivatives     REACTION: rash   Terbinafine  Unknown reaction  Not listed on the [facility]    Benazepril  Cough    Not listed on the [facility]     Outpatient Encounter Medications as of 03/08/2024  Medication Sig   acetaminophen  (TYLENOL ) 500 MG tablet Take 500 mg by mouth. Give 1000mg  by mouth as needed for pain TID PRN   ALPRAZolam  (XANAX ) 0.5 MG tablet Take 1 tablet (0.5 mg total) by mouth daily as needed for anxiety.   amLODipine  (NORVASC ) 2.5 MG tablet Take 2.5 mg by mouth daily.   aspirin  EC 81 MG tablet Take 81 mg by mouth daily. Swallow whole.   busPIRone  (BUSPAR ) 15 MG tablet Take 15 mg by mouth 2 (two) times daily. Second listing in [facility] states take three times daily   Docusate Sodium  100 MG capsule Take 100 mg by mouth as  needed for constipation.   DULoxetine  (CYMBALTA ) 30 MG capsule Take 90 mg by mouth daily.   gabapentin  (NEURONTIN ) 100 MG capsule Take 200 mg by mouth 2 (two) times daily.   levothyroxine  (SYNTHROID ) 100 MCG tablet Take 100 mcg by mouth daily.   loperamide (IMODIUM A-D) 2 MG tablet Take 2 mg by mouth every 8 (eight) hours as needed for diarrhea or loose stools.   losartan  (COZAAR ) 50 MG tablet Take 50 mg by mouth 2 (two) times daily.   magnesium hydroxide (MILK OF MAGNESIA) 400 MG/5ML suspension Take 30 mLs by mouth daily as needed for mild constipation.   methenamine  (HIPREX ) 1 g tablet Take 1 g by mouth 2 (two) times daily.   metoprolol  succinate (TOPROL  XL) 25 MG 24 hr tablet Take 1 tablet (25 mg total) by mouth daily.   mirtazapine  (REMERON ) 15 MG tablet Take 15 mg by mouth at bedtime.   OLANZapine  (ZYPREXA ) 5 MG tablet Take 5 mg by mouth daily.   ondansetron  (ZOFRAN ) 4 MG tablet Take 4 mg by mouth daily as needed for nausea or vomiting.   polyethylene glycol (MIRALAX ) 17 g packet Take 17 g by mouth daily.   saccharomyces boulardii (FLORASTOR) 250 MG capsule Take 250 mg by mouth 2 (two) times daily.   senna (SENOKOT) 8.6 MG TABS tablet Take 1 tablet by mouth daily. May also take 2 tablets by mouth as needed for constipation   senna (SENOKOT) 8.6 MG TABS tablet Take 1 tablet by mouth daily.   spironolactone  (ALDACTONE ) 12.5 mg TABS tablet Take 12.5 mg by mouth daily.   No facility-administered encounter medications on file as of 03/08/2024.    Review of Systems  Constitutional:  Negative for activity change and appetite change.  HENT: Negative.    Respiratory:  Negative for cough and shortness of breath.   Cardiovascular:  Negative for leg swelling.  Gastrointestinal:  Negative for constipation.  Genitourinary: Negative.   Musculoskeletal:  Positive for gait problem. Negative for arthralgias and myalgias.  Skin: Negative.   Neurological:  Negative for dizziness and weakness.   Psychiatric/Behavioral:  Positive for dysphoric mood. Negative for confusion and sleep disturbance. The patient is nervous/anxious.     Immunization History  Administered Date(s) Administered   Fluad Quad(high Dose 65+) 12/07/2023   Influenza, High Dose Seasonal PF 08/04/2017, 07/27/2018, 07/27/2020, 08/06/2021   Influenza,inj,Quad PF,6-35 Mos 12/26/2019   Moderna Covid-19 Fall Seasonal Vaccine 34yrs & older 12/14/2023   PFIZER Comirnaty(Gray Top)Covid-19 Tri-Sucrose Vaccine 04/22/2021   PFIZER(Purple Top)SARS-COV-2 Vaccination 11/17/2019, 12/06/2019   PPD Test 10/13/2020   Pfizer Covid-19 Vaccine Bivalent Booster 64yrs & up 08/27/2021   Tdap 02/15/2022  Zoster Recombinant(Shingrix) 01/18/2018, 03/21/2018, 04/20/2018   Pertinent  Health Maintenance Due  Topic Date Due   DEXA SCAN  Never done   INFLUENZA VACCINE  06/01/2024      09/26/2020    3:57 PM 08/07/2021    1:56 PM 02/15/2022    2:38 PM 03/17/2023    3:02 PM 02/13/2024    1:41 PM  Fall Risk  Falls in the past year?    0 1  Was there an injury with Fall?    0 1  Fall Risk Category Calculator    0 3  (RETIRED) Patient Fall Risk Level High fall risk Low fall risk Moderate fall risk    Patient at Risk for Falls Due to    No Fall Risks History of fall(s)  Fall risk Follow up    Falls evaluation completed Falls evaluation completed;Education provided   Functional Status Survey:    Vitals:   03/08/24 1213  BP: 135/67  Pulse: 60  Resp: (!) 22  Temp: 97.9 F (36.6 C)  SpO2: 96%  Weight: 146 lb 3.2 oz (66.3 kg)  Height: 5\' 4"  (1.626 m)   Body mass index is 25.1 kg/m. Physical Exam Vitals reviewed.  Constitutional:      Appearance: Normal appearance.  HENT:     Head: Normocephalic.     Nose: Nose normal.     Mouth/Throat:     Mouth: Mucous membranes are moist.     Pharynx: Oropharynx is clear.  Eyes:     Pupils: Pupils are equal, round, and reactive to light.  Cardiovascular:     Rate and Rhythm: Normal rate  and regular rhythm.     Pulses: Normal pulses.     Heart sounds: Normal heart sounds. No murmur heard. Pulmonary:     Effort: Pulmonary effort is normal.     Breath sounds: Normal breath sounds.  Abdominal:     General: Abdomen is flat. Bowel sounds are normal.     Palpations: Abdomen is soft.  Musculoskeletal:        General: No swelling.     Cervical back: Neck supple.  Skin:    General: Skin is warm.  Neurological:     General: No focal deficit present.     Mental Status: She is alert and oriented to person, place, and time.  Psychiatric:        Attention and Perception: Attention normal.        Mood and Affect: Mood is depressed.        Speech: Speech is delayed.        Behavior: Behavior is slowed and withdrawn.        Thought Content: Thought content normal.        Cognition and Memory: Cognition normal.        Judgment: Judgment normal.     Labs reviewed: Recent Labs    06/13/23 0403 06/14/23 0357 06/15/23 0340 08/07/23 1245 09/10/23 1823 09/23/23 0000 09/29/23 0524 09/30/23 0614 10/01/23 0340 11/17/23 0000 11/21/23 0000 12/13/23 0000 02/16/24 0000  NA 128* 131* 133*   < > 130*   < > 132* 134* 135   < > 139 138 130*  K 4.0 4.0 3.8   < > 4.3   < > 3.9 3.6 3.7   < > 4.0 4.4 5.0  CL 99 100 102   < > 96*   < > 104 101 104   < > 105 104 98*  CO2 21* 25 25   < >  25   < > 25 23 25    < > 28* 29* 25*  GLUCOSE 92 92 95   < > 91   < > 89 89 108*  --   --   --   --   BUN 14 12 11    < > 10   < > 18 16 12    < > 19 17 18   CREATININE 1.36* 0.93 0.73   < > 1.02*   < > 1.00 0.79 0.78   < > 0.9 0.8 0.9  CALCIUM  9.2 8.8* 8.7*   < > 10.3   < > 8.7* 9.0 9.0   < > 9.2 9.2 9.6  MG 2.3 2.0 2.0  --  1.9  --   --   --   --   --   --   --   --   PHOS 3.9 3.3 3.6  --   --   --   --   --   --   --   --   --   --    < > = values in this interval not displayed.   Recent Labs    08/07/23 1245 09/08/23 1532 09/23/23 0000 09/26/23 2043  AST 21 21 19 29   ALT 15 18 14 23   ALKPHOS 60  76 76 82  BILITOT 1.0 0.7  --  0.7  PROT 6.6 7.3  --  6.6  ALBUMIN  4.0 4.3 3.7 3.6   Recent Labs    09/23/23 0000 09/26/23 2043 09/27/23 0607 09/29/23 0524 09/30/23 0614 10/01/23 0340 01/26/24 0000 02/16/24 0000  WBC 5.3 5.2   < > 9.9 8.5 8.4 7.7 7.3  NEUTROABS 3,058.00 3.5  --   --   --   --   --  4,249.00  HGB 11.1* 11.8*   < > 9.3* 10.2* 10.8* 12.1 12.3  HCT 33* 36.3   < > 27.6* 30.0* 32.6* 37 39  MCV  --  96.0   < > 92.3 91.7 90.8  --   --   PLT 223 216   < > 172 192 227 322 276   < > = values in this interval not displayed.   Lab Results  Component Value Date   TSH 0.77 09/23/2023   No results found for: "HGBA1C" Lab Results  Component Value Date   CHOL 191 09/23/2023   HDL 56 09/23/2023   LDLCALC 112 09/23/2023   TRIG 118 09/23/2023    Significant Diagnostic Results in last 30 days:  No results found.  Assessment/Plan 1. Major depressive disorder, recurrent severe without psychotic features (HCC) (Primary) Will start her on Wellbutrin   It seems it was tried before but she never took it  2. GAD (generalized anxiety disorder) Continue Xanax  0.5 mg BID PRN    Family/ staff Communication:   Labs/tests ordered:

## 2024-03-09 ENCOUNTER — Encounter: Payer: Self-pay | Admitting: Orthopedic Surgery

## 2024-03-09 ENCOUNTER — Non-Acute Institutional Stay: Payer: Self-pay | Admitting: Orthopedic Surgery

## 2024-03-09 DIAGNOSIS — F419 Anxiety disorder, unspecified: Secondary | ICD-10-CM

## 2024-03-09 DIAGNOSIS — F332 Major depressive disorder, recurrent severe without psychotic features: Secondary | ICD-10-CM

## 2024-03-09 NOTE — Progress Notes (Unsigned)
 Location:  [facility] Room Number: 14/A Place of Service:  ALF [phone]) Provider:  Arnetha Bhat, NP   Marguerite Shiley, MD  Patient Care Team: Marguerite Shiley, MD as PCP - General (Internal Medicine) Lenise Quince, MD as PCP - Cardiology (Cardiology) [facility], Warren Haber, PA as Physician Assistant (Cardiology)  Extended Emergency Contact Information Primary Emergency Contact: [facility] Phone: [phone] Relation: Son  Code Status:  Full code Goals of care: Advanced Directive information    12/23/2023    1:55 PM  Advanced Directives  Does Patient Have a Medical Advance Directive? No  Does patient want to make changes to medical advance directive? No - Patient declined     Chief Complaint  Patient presents with  . Acute Visit    Increased anxiety, not feeling well since Wellbutrin  dose    HPI:  Pt is a 83 y.o. female seen today for acute visit due to increased anxiety, concerns with Wellbutrin .   She recently moved to [place] at [facility] 09/12/2023. PMH: HTN, HLD, migraines, IBS, thyroid  disease, neuropathy, CKD, recurrent UTI, hyponatremia, s/p right hip fracture 09/26/2023 s/p right hemiarthroplasty 09/27/2023, major depression, anxiety and insomnia.   05/08 she was started on Wellbutrin  due to increased depression. She remains on    Past Medical History:  Diagnosis Date  . Adenomatous colon polyp   . Adenomatous polyp of colon 12/03/2004   6mm  . Anxiety   . Chronic headaches   . Chronic insomnia   . Depression   . Diverticulosis   . External hemorrhoids   . Hyperlipidemia   . Hypertension   . Hyperthyroidism   . Hypothyroidism   . Umbilical hernia    Past Surgical History:  Procedure Laterality Date  . ABDOMINAL HYSTERECTOMY  1975  . APPENDECTOMY  1975  . COLONOSCOPY  03/03/2010,12/03/2004  . ESOPHAGOGASTRODUODENOSCOPY  12/16/11   Dr. Loy Ruff  . HIP ARTHROPLASTY Right 09/27/2023   Procedure: ARTHROPLASTY BIPOLAR  HIP (HEMIARTHROPLASTY);  Surgeon: Arnie Lao, MD;  Location: [facility] OR;  Service: Orthopedics;  Laterality: Right;  . OOPHORECTOMY    . spinal lipoma removed  2006  . TOTAL ABDOMINAL HYSTERECTOMY      Allergies  Allergen Reactions  . Hydrocodone -Acetaminophen  Rash    Vicodin=REACTION: rash  Not listed on the [facility]  . Iodinated Contrast Media Hives and Rash    Per patient she has developed hives and itching in upper trunk of body after last 2 CT scans once she got homes that she has just treated at home with benadryl . She has not told anyone until now. States she was able to bear it  Other reaction(s): Rash Per patient she has developed hives and itching in upper trunk of body after last 2 CT scans once she got homes that she has just treated at home with benadryl . She has not told anyone until now. States she was able to bear it   Not listed on the [facility]   . Sulfa Antibiotics Rash    REACTION: rash Other reaction(s): Unknown  . Codeine     Unknown reaction  Not listed on the [facility]   . Sulfonamide Derivatives     REACTION: rash  . Terbinafine     Unknown reaction  Not listed on the [facility]   . Benazepril  Cough    Not listed on the [facility]     Outpatient Encounter Medications as of 03/09/2024  Medication Sig  . acetaminophen  (TYLENOL ) 500 MG  tablet Take 500 mg by mouth. Give 1000mg  by mouth as needed for pain TID PRN  . ALPRAZolam  (XANAX ) 0.5 MG tablet Take 1 tablet (0.5 mg total) by mouth daily as needed for anxiety. (Patient taking differently: Take 0.5 mg by mouth 2 (two) times daily as needed for anxiety.)  . amLODipine  (NORVASC ) 2.5 MG tablet Take 2.5 mg by mouth daily.  . aspirin  EC 81 MG tablet Take 81 mg by mouth daily. Swallow whole.  . buPROPion  (WELLBUTRIN  XL) 150 MG 24 hr tablet Take 150 mg by mouth daily.  . busPIRone  (BUSPAR ) 15 MG tablet Take 15 mg by mouth 2 (two) times daily. Second listing in [facility] states take three times daily  . Docusate Sodium  100  MG capsule Take 100 mg by mouth as needed for constipation.  . DULoxetine  (CYMBALTA ) 30 MG capsule Take 90 mg by mouth daily.  . gabapentin  (NEURONTIN ) 100 MG capsule Take 200 mg by mouth 2 (two) times daily.  . levothyroxine  (SYNTHROID ) 100 MCG tablet Take 100 mcg by mouth daily.  Aaron Aas loperamide (IMODIUM A-D) 2 MG tablet Take 2 mg by mouth every 8 (eight) hours as needed for diarrhea or loose stools.  . losartan  (COZAAR ) 50 MG tablet Take 50 mg by mouth 2 (two) times daily.  . magnesium hydroxide (MILK OF MAGNESIA) 400 MG/5ML suspension Take 30 mLs by mouth daily as needed for mild constipation.  . methenamine  (HIPREX ) 1 g tablet Take 1 g by mouth 2 (two) times daily.  . metoprolol  succinate (TOPROL  XL) 25 MG 24 hr tablet Take 1 tablet (25 mg total) by mouth daily.  . mirtazapine  (REMERON ) 15 MG tablet Take 15 mg by mouth at bedtime.  . OLANZapine  (ZYPREXA ) 5 MG tablet Take 5 mg by mouth daily.  . ondansetron  (ZOFRAN ) 4 MG tablet Take 4 mg by mouth daily as needed for nausea or vomiting.  . polyethylene glycol (MIRALAX ) 17 g packet Take 17 g by mouth daily.  Aaron Aas saccharomyces boulardii (FLORASTOR) 250 MG capsule Take 250 mg by mouth 2 (two) times daily.  Aaron Aas senna (SENOKOT) 8.6 MG TABS tablet Take 1 tablet by mouth daily. May also take 2 tablets by mouth as needed for constipation  . senna (SENOKOT) 8.6 MG TABS tablet Take 1 tablet by mouth daily.  . spironolactone  (ALDACTONE ) 12.5 mg TABS tablet Take 12.5 mg by mouth daily.   No facility-administered encounter medications on file as of 03/09/2024.    Review of Systems  Immunization History  Administered Date(s) Administered  . Fluad Quad(high Dose 65+) 12/07/2023  . Influenza, High Dose Seasonal PF 08/04/2017, 07/27/2018, 07/27/2020, 08/06/2021  . Influenza,inj,Quad PF,6-35 Mos 12/26/2019  . Moderna Covid-19 Fall Seasonal Vaccine 85yrs & older 12/14/2023  . PFIZER Comirnaty(Gray Top)Covid-19 Tri-Sucrose Vaccine 04/22/2021  . PFIZER(Purple  Top)SARS-COV-2 Vaccination 11/17/2019, 12/06/2019  . PPD Test 10/13/2020  . Pfizer Covid-19 Vaccine Bivalent Booster 96yrs & up 08/27/2021  . Tdap 02/15/2022  . Zoster Recombinant(Shingrix) 01/18/2018, 03/21/2018, 04/20/2018   Pertinent  Health Maintenance Due  Topic Date Due  . DEXA SCAN  Never done  . INFLUENZA VACCINE  06/01/2024      09/26/2020    3:57 PM 08/07/2021    1:56 PM 02/15/2022    2:38 PM 03/17/2023    3:02 PM 02/13/2024    1:41 PM  Fall Risk  Falls in the past year?    0 1  Was there an injury with Fall?    0 1  Fall Risk Category  Calculator    0 3  (RETIRED) Patient Fall Risk Level High fall risk Low fall risk Moderate fall risk    Patient at Risk for Falls Due to    No Fall Risks History of fall(s)  Fall risk Follow up    Falls evaluation completed Falls evaluation completed;Education provided   Functional Status Survey:    Vitals:   03/09/24 1521  BP: 131/72  Pulse: (!) 59  Resp: (!) 22  Temp: 97.9 F (36.6 C)  SpO2: 96%  Weight: 146 lb 3.2 oz (66.3 kg)  Height: 5\' 4"  (1.626 m)   Body mass index is 25.1 kg/m. Physical Exam  Labs reviewed: Recent Labs    06/13/23 0403 06/14/23 0357 06/15/23 0340 08/07/23 1245 09/10/23 1823 09/23/23 0000 09/29/23 0524 09/30/23 0614 10/01/23 0340 11/17/23 0000 11/21/23 0000 12/13/23 0000 02/16/24 0000  NA 128* 131* 133*   < > 130*   < > 132* 134* 135   < > 139 138 130*  K 4.0 4.0 3.8   < > 4.3   < > 3.9 3.6 3.7   < > 4.0 4.4 5.0  CL 99 100 102   < > 96*   < > 104 101 104   < > 105 104 98*  CO2 21* 25 25   < > 25   < > 25 23 25    < > 28* 29* 25*  GLUCOSE 92 92 95   < > 91   < > 89 89 108*  --   --   --   --   BUN 14 12 11    < > 10   < > 18 16 12    < > 19 17 18   CREATININE 1.36* 0.93 0.73   < > 1.02*   < > 1.00 0.79 0.78   < > 0.9 0.8 0.9  CALCIUM  9.2 8.8* 8.7*   < > 10.3   < > 8.7* 9.0 9.0   < > 9.2 9.2 9.6  MG 2.3 2.0 2.0  --  1.9  --   --   --   --   --   --   --   --   PHOS 3.9 3.3 3.6  --   --   --    --   --   --   --   --   --   --    < > = values in this interval not displayed.   Recent Labs    08/07/23 1245 09/08/23 1532 09/23/23 0000 09/26/23 2043  AST 21 21 19 29   ALT 15 18 14 23   ALKPHOS 60 76 76 82  BILITOT 1.0 0.7  --  0.7  PROT 6.6 7.3  --  6.6  ALBUMIN  4.0 4.3 3.7 3.6   Recent Labs    09/23/23 0000 09/26/23 2043 09/27/23 0607 09/29/23 0524 09/30/23 0614 10/01/23 0340 01/26/24 0000 02/16/24 0000  WBC 5.3 5.2   < > 9.9 8.5 8.4 7.7 7.3  NEUTROABS 3,058.00 3.5  --   --   --   --   --  4,249.00  HGB 11.1* 11.8*   < > 9.3* 10.2* 10.8* 12.1 12.3  HCT 33* 36.3   < > 27.6* 30.0* 32.6* 37 39  MCV  --  96.0   < > 92.3 91.7 90.8  --   --   PLT 223 216   < > 172 192 227 322 276   < > = values in this  interval not displayed.   Lab Results  Component Value Date   TSH 0.77 09/23/2023   No results found for: "HGBA1C" Lab Results  Component Value Date   CHOL 191 09/23/2023   HDL 56 09/23/2023   LDLCALC 112 09/23/2023   TRIG 118 09/23/2023    Significant Diagnostic Results in last 30 days:  No results found.  Assessment/Plan There are no diagnoses linked to this encounter.   Family/ staff Communication: ***  Labs/tests ordered:  ***

## 2024-03-10 DIAGNOSIS — S72001D Fracture of unspecified part of neck of right femur, subsequent encounter for closed fracture with routine healing: Secondary | ICD-10-CM | POA: Diagnosis not present

## 2024-03-10 DIAGNOSIS — M6281 Muscle weakness (generalized): Secondary | ICD-10-CM | POA: Diagnosis not present

## 2024-03-10 DIAGNOSIS — M25551 Pain in right hip: Secondary | ICD-10-CM | POA: Diagnosis not present

## 2024-03-10 DIAGNOSIS — R2681 Unsteadiness on feet: Secondary | ICD-10-CM | POA: Diagnosis not present

## 2024-03-12 DIAGNOSIS — R2681 Unsteadiness on feet: Secondary | ICD-10-CM | POA: Diagnosis not present

## 2024-03-12 DIAGNOSIS — M6281 Muscle weakness (generalized): Secondary | ICD-10-CM | POA: Diagnosis not present

## 2024-03-12 DIAGNOSIS — M25551 Pain in right hip: Secondary | ICD-10-CM | POA: Diagnosis not present

## 2024-03-12 DIAGNOSIS — S72001D Fracture of unspecified part of neck of right femur, subsequent encounter for closed fracture with routine healing: Secondary | ICD-10-CM | POA: Diagnosis not present

## 2024-03-12 MED ORDER — ALPRAZOLAM 0.5 MG PO TABS: 0.5000 mg | ORAL_TABLET | Freq: Two times a day (BID) | ORAL | Status: DC | PRN

## 2024-03-13 DIAGNOSIS — M6281 Muscle weakness (generalized): Secondary | ICD-10-CM | POA: Diagnosis not present

## 2024-03-13 DIAGNOSIS — F331 Major depressive disorder, recurrent, moderate: Secondary | ICD-10-CM | POA: Diagnosis not present

## 2024-03-13 DIAGNOSIS — S72001D Fracture of unspecified part of neck of right femur, subsequent encounter for closed fracture with routine healing: Secondary | ICD-10-CM | POA: Diagnosis not present

## 2024-03-13 DIAGNOSIS — M25551 Pain in right hip: Secondary | ICD-10-CM | POA: Diagnosis not present

## 2024-03-14 DIAGNOSIS — R2681 Unsteadiness on feet: Secondary | ICD-10-CM | POA: Diagnosis not present

## 2024-03-14 DIAGNOSIS — S72001D Fracture of unspecified part of neck of right femur, subsequent encounter for closed fracture with routine healing: Secondary | ICD-10-CM | POA: Diagnosis not present

## 2024-03-14 DIAGNOSIS — M25551 Pain in right hip: Secondary | ICD-10-CM | POA: Diagnosis not present

## 2024-03-14 DIAGNOSIS — M6281 Muscle weakness (generalized): Secondary | ICD-10-CM | POA: Diagnosis not present

## 2024-03-15 DIAGNOSIS — M25551 Pain in right hip: Secondary | ICD-10-CM | POA: Diagnosis not present

## 2024-03-15 DIAGNOSIS — S72001D Fracture of unspecified part of neck of right femur, subsequent encounter for closed fracture with routine healing: Secondary | ICD-10-CM | POA: Diagnosis not present

## 2024-03-15 DIAGNOSIS — F331 Major depressive disorder, recurrent, moderate: Secondary | ICD-10-CM | POA: Diagnosis not present

## 2024-03-15 DIAGNOSIS — M6281 Muscle weakness (generalized): Secondary | ICD-10-CM | POA: Diagnosis not present

## 2024-03-19 DIAGNOSIS — S72001D Fracture of unspecified part of neck of right femur, subsequent encounter for closed fracture with routine healing: Secondary | ICD-10-CM | POA: Diagnosis not present

## 2024-03-19 DIAGNOSIS — F331 Major depressive disorder, recurrent, moderate: Secondary | ICD-10-CM | POA: Diagnosis not present

## 2024-03-19 DIAGNOSIS — M6281 Muscle weakness (generalized): Secondary | ICD-10-CM | POA: Diagnosis not present

## 2024-03-19 DIAGNOSIS — R2681 Unsteadiness on feet: Secondary | ICD-10-CM | POA: Diagnosis not present

## 2024-03-19 DIAGNOSIS — M25551 Pain in right hip: Secondary | ICD-10-CM | POA: Diagnosis not present

## 2024-03-20 DIAGNOSIS — S72001D Fracture of unspecified part of neck of right femur, subsequent encounter for closed fracture with routine healing: Secondary | ICD-10-CM | POA: Diagnosis not present

## 2024-03-20 DIAGNOSIS — F331 Major depressive disorder, recurrent, moderate: Secondary | ICD-10-CM | POA: Diagnosis not present

## 2024-03-20 DIAGNOSIS — M6281 Muscle weakness (generalized): Secondary | ICD-10-CM | POA: Diagnosis not present

## 2024-03-20 DIAGNOSIS — M25551 Pain in right hip: Secondary | ICD-10-CM | POA: Diagnosis not present

## 2024-03-21 DIAGNOSIS — S72001D Fracture of unspecified part of neck of right femur, subsequent encounter for closed fracture with routine healing: Secondary | ICD-10-CM | POA: Diagnosis not present

## 2024-03-21 DIAGNOSIS — M6281 Muscle weakness (generalized): Secondary | ICD-10-CM | POA: Diagnosis not present

## 2024-03-21 DIAGNOSIS — M25551 Pain in right hip: Secondary | ICD-10-CM | POA: Diagnosis not present

## 2024-03-21 DIAGNOSIS — F331 Major depressive disorder, recurrent, moderate: Secondary | ICD-10-CM | POA: Diagnosis not present

## 2024-03-22 ENCOUNTER — Non-Acute Institutional Stay: Payer: Self-pay | Admitting: Internal Medicine

## 2024-03-22 ENCOUNTER — Encounter: Payer: Self-pay | Admitting: Internal Medicine

## 2024-03-22 DIAGNOSIS — M6281 Muscle weakness (generalized): Secondary | ICD-10-CM | POA: Diagnosis not present

## 2024-03-22 DIAGNOSIS — F5101 Primary insomnia: Secondary | ICD-10-CM

## 2024-03-22 DIAGNOSIS — I10 Essential (primary) hypertension: Secondary | ICD-10-CM

## 2024-03-22 DIAGNOSIS — S72001D Fracture of unspecified part of neck of right femur, subsequent encounter for closed fracture with routine healing: Secondary | ICD-10-CM | POA: Diagnosis not present

## 2024-03-22 DIAGNOSIS — F332 Major depressive disorder, recurrent severe without psychotic features: Secondary | ICD-10-CM

## 2024-03-22 DIAGNOSIS — F331 Major depressive disorder, recurrent, moderate: Secondary | ICD-10-CM | POA: Diagnosis not present

## 2024-03-22 DIAGNOSIS — R2681 Unsteadiness on feet: Secondary | ICD-10-CM | POA: Diagnosis not present

## 2024-03-22 DIAGNOSIS — M25551 Pain in right hip: Secondary | ICD-10-CM | POA: Diagnosis not present

## 2024-03-22 NOTE — Progress Notes (Signed)
 Location:  [facility] Room Number: 14 A Place of Service:  ALF [phone]) Provider: Marguerite Shiley, MD   Marguerite Shiley, MD  Patient Care Team: Marguerite Shiley, MD as PCP - General (Internal Medicine) Lenise Quince, MD as PCP - Cardiology (Cardiology) Duke, Warren Haber, PA as Physician Assistant (Cardiology)  Extended Emergency Contact Information Primary Emergency Contact: [facility] Phone: [phone] Relation: Son  Code Status:  (Advance Directives) Goals of care: Advanced Directive information    12/23/2023    1:55 PM  Advanced Directives  Does Patient Have a Medical Advance Directive? No  Does patient want to make changes to medical advance directive? No - Patient declined     Chief Complaint  Patient presents with   Acute Visit    HPI:  Pt is a 83 y.o. female seen today for an acute visit for  Depression Anxiety ? Shuffling Gait  Lives in [place] in [facility]   She also has a history of acute depression with weight loss, recurrent UTIs, hypertension, hyponatremia, severe anxiety, hypothyroidism    Patient had been stable on Remeron  and Zyprexa  Also on Cymbalta  but gets hyponatremia on other SSRI  Per Family patient continues to be depressed I had started her on Wellbutrin  but she says she did not feel good and stopped it. Also c/o Not able to sleep at night when on Wellbutrin  She feels down with no energy  Her other issue per therapy and her family is that she is shuffling her gait She was able to walk with her walker but taking smaller steps No falls Weight is stable and she is sleeping well   Past Medical History:  Diagnosis Date   Adenomatous colon polyp    Adenomatous polyp of colon 12/03/2004   6mm   Anxiety    Chronic headaches    Chronic insomnia    Depression    Diverticulosis    External hemorrhoids    Hyperlipidemia    Hypertension    Hyperthyroidism    Hypothyroidism    Umbilical hernia    Past  Surgical History:  Procedure Laterality Date   ABDOMINAL HYSTERECTOMY  1975   APPENDECTOMY  1975   COLONOSCOPY  03/03/2010,12/03/2004   ESOPHAGOGASTRODUODENOSCOPY  12/16/11   Dr. Loy Ruff   HIP ARTHROPLASTY Right 09/27/2023   Procedure: ARTHROPLASTY BIPOLAR HIP (HEMIARTHROPLASTY);  Surgeon: Arnie Lao, MD;  Location: [facility] OR;  Service: Orthopedics;  Laterality: Right;   OOPHORECTOMY     spinal lipoma removed  2006   TOTAL ABDOMINAL HYSTERECTOMY      Allergies  Allergen Reactions   Hydrocodone -Acetaminophen  Rash    Vicodin=REACTION: rash  Not listed on the MAR   Iodinated Contrast Media Hives and Rash    Per patient she has developed hives and itching in upper trunk of body after last 2 CT scans once she got homes that she has just treated at home with benadryl . She has not told anyone until now. States she was able to bear it  Other reaction(s): Rash Per patient she has developed hives and itching in upper trunk of body after last 2 CT scans once she got homes that she has just treated at home with benadryl . She has not told anyone until now. States she was able to bear it   Not listed on the [facility]    Sulfa Antibiotics Rash    REACTION: rash Other reaction(s): Unknown   Codeine     Unknown reaction  Not listed on the [facility]    Sulfonamide Derivatives     REACTION: rash   Terbinafine     Unknown reaction  Not listed on the [facility]    Benazepril  Cough    Not listed on the [facility]     Outpatient Encounter Medications as of 03/22/2024  Medication Sig   acetaminophen  (TYLENOL ) 500 MG tablet Take 500 mg by mouth. Give 1000mg  by mouth as needed for pain TID PRN   ALPRAZolam  (XANAX ) 0.5 MG tablet Take 1 tablet (0.5 mg total) by mouth 2 (two) times daily as needed for anxiety.   amLODipine  (NORVASC ) 2.5 MG tablet Take 2.5 mg by mouth daily.   aspirin  EC 81 MG tablet Take 81 mg by mouth daily. Swallow whole.   busPIRone  (BUSPAR ) 15 MG tablet Take 15 mg by mouth 3 (three) times  daily. Second listing in [facility] states take three times daily   Docusate Sodium  100 MG capsule Take 100 mg by mouth as needed for constipation.   DULoxetine  (CYMBALTA ) 30 MG capsule Take 90 mg by mouth daily.   gabapentin  (NEURONTIN ) 100 MG capsule Take 200 mg by mouth 2 (two) times daily.   levothyroxine  (SYNTHROID ) 100 MCG tablet Take 100 mcg by mouth daily.   loperamide (IMODIUM A-D) 2 MG tablet Take 2 mg by mouth every 8 (eight) hours as needed for diarrhea or loose stools.   losartan  (COZAAR ) 50 MG tablet Take 50 mg by mouth 2 (two) times daily.   magnesium hydroxide (MILK OF MAGNESIA) 400 MG/5ML suspension Take 30 mLs by mouth daily as needed for mild constipation.   methenamine  (HIPREX ) 1 g tablet Take 1 g by mouth 2 (two) times daily.   metoprolol  succinate (TOPROL  XL) 25 MG 24 hr tablet Take 1 tablet (25 mg total) by mouth daily.   mirtazapine  (REMERON ) 15 MG tablet Take 15 mg by mouth at bedtime.   OLANZapine  (ZYPREXA ) 5 MG tablet Take 5 mg by mouth daily.   ondansetron  (ZOFRAN ) 4 MG tablet Take 4 mg by mouth daily as needed for nausea or vomiting. Give 2 tablet by mouth every 24 hours as needed for nausea?vomit   polyethylene glycol (MIRALAX ) 17 g packet Take 17 g by mouth daily.   saccharomyces boulardii (FLORASTOR) 250 MG capsule Take 250 mg by mouth 2 (two) times daily.   senna (SENOKOT) 8.6 MG TABS tablet Take 1 tablet by mouth daily. May also take 2 tablets by mouth as needed for constipation   spironolactone  (ALDACTONE ) 12.5 mg TABS tablet Take 12.5 mg by mouth daily.   senna (SENOKOT) 8.6 MG TABS tablet Take 1 tablet by mouth daily. (Patient not taking: Reported on 03/22/2024)   No facility-administered encounter medications on file as of 03/22/2024.    Review of Systems  Constitutional:  Negative for activity change and appetite change.  HENT: Negative.    Respiratory:  Negative for cough and shortness of breath.   Cardiovascular:  Negative for leg swelling.   Gastrointestinal:  Negative for constipation.  Genitourinary: Negative.   Musculoskeletal:  Positive for gait problem. Negative for arthralgias and myalgias.  Skin: Negative.   Neurological:  Negative for dizziness and weakness.  Psychiatric/Behavioral:  Positive for dysphoric mood. Negative for confusion and sleep disturbance. The patient is nervous/anxious.     Immunization History  Administered Date(s) Administered   Fluad Quad(high Dose 65+) 12/07/2023   Influenza, High Dose Seasonal PF 08/04/2017, 07/27/2018, 07/27/2020, 08/06/2021   Influenza,inj,Quad PF,6-35 Mos 12/26/2019   Moderna  Covid-19 Fall Seasonal Vaccine 40yrs & older 12/14/2023   PFIZER Comirnaty(Gray Top)Covid-19 Tri-Sucrose Vaccine 04/22/2021   PFIZER(Purple Top)SARS-COV-2 Vaccination 11/17/2019, 12/06/2019   PPD Test 10/13/2020   Pfizer Covid-19 Vaccine Bivalent Booster 64yrs & up 08/27/2021   Tdap 02/15/2022   Zoster Recombinant(Shingrix) 01/18/2018, 03/21/2018, 04/20/2018   Pertinent  Health Maintenance Due  Topic Date Due   DEXA SCAN  Never done   INFLUENZA VACCINE  06/01/2024      09/26/2020    3:57 PM 08/07/2021    1:56 PM 02/15/2022    2:38 PM 03/17/2023    3:02 PM 02/13/2024    1:41 PM  Fall Risk  Falls in the past year?    0 1  Was there an injury with Fall?    0 1  Fall Risk Category Calculator    0 3  (RETIRED) Patient Fall Risk Level High fall risk Low fall risk Moderate fall risk    Patient at Risk for Falls Due to    No Fall Risks History of fall(s)  Fall risk Follow up    Falls evaluation completed Falls evaluation completed;Education provided   Functional Status Survey:    Vitals:   03/22/24 1041  BP: 110/78  Pulse: 67  Resp: 18  Temp: 97.9 F (36.6 C)  SpO2: 93%  Weight: 146 lb 3.2 oz (66.3 kg)  Height: 5\' 4"  (1.626 m)   Body mass index is 25.1 kg/m. Physical Exam Vitals reviewed.  Constitutional:      Appearance: Normal appearance.  HENT:     Head: Normocephalic.      Nose: Nose normal.     Mouth/Throat:     Mouth: Mucous membranes are moist.     Pharynx: Oropharynx is clear.  Eyes:     Pupils: Pupils are equal, round, and reactive to light.  Cardiovascular:     Rate and Rhythm: Normal rate and regular rhythm.     Pulses: Normal pulses.     Heart sounds: Normal heart sounds. No murmur heard. Pulmonary:     Effort: Pulmonary effort is normal.     Breath sounds: Normal breath sounds.  Abdominal:     General: Abdomen is flat. Bowel sounds are normal.     Palpations: Abdomen is soft.  Musculoskeletal:        General: No swelling.     Cervical back: Neck supple.  Skin:    General: Skin is warm.  Neurological:     General: No focal deficit present.     Mental Status: She is alert and oriented to person, place, and time.     Comments: Patient is walking with her walker  Was Not lifting her Left Leg Well but had stable gait  Psychiatric:        Mood and Affect: Mood normal.        Thought Content: Thought content normal.    Labs reviewed: Recent Labs    06/13/23 0403 06/14/23 0357 06/15/23 0340 08/07/23 1245 09/10/23 1823 09/23/23 0000 09/29/23 0524 09/30/23 0614 10/01/23 0340 11/17/23 0000 12/13/23 0000 02/16/24 0000 03/08/24 0000  NA 128* 131* 133*   < > 130*   < > 132* 134* 135   < > 138 130* 130*  K 4.0 4.0 3.8   < > 4.3   < > 3.9 3.6 3.7   < > 4.4 5.0 4.5  CL 99 100 102   < > 96*   < > 104 101 104   < > 104 98*  97*  CO2 21* 25 25   < > 25   < > 25 23 25    < > 29* 25* 25*  GLUCOSE 92 92 95   < > 91   < > 89 89 108*  --   --   --   --   BUN 14 12 11    < > 10   < > 18 16 12    < > 17 18 16   CREATININE 1.36* 0.93 0.73   < > 1.02*   < > 1.00 0.79 0.78   < > 0.8 0.9 1.0  CALCIUM  9.2 8.8* 8.7*   < > 10.3   < > 8.7* 9.0 9.0   < > 9.2 9.6 9.6  MG 2.3 2.0 2.0  --  1.9  --   --   --   --   --   --   --   --   PHOS 3.9 3.3 3.6  --   --   --   --   --   --   --   --   --   --    < > = values in this interval not displayed.   Recent Labs     08/07/23 1245 09/08/23 1532 09/23/23 0000 09/26/23 2043  AST 21 21 19 29   ALT 15 18 14 23   ALKPHOS 60 76 76 82  BILITOT 1.0 0.7  --  0.7  PROT 6.6 7.3  --  6.6  ALBUMIN  4.0 4.3 3.7 3.6   Recent Labs    09/23/23 0000 09/26/23 2043 09/27/23 0607 09/29/23 0524 09/30/23 0614 10/01/23 0340 01/26/24 0000 02/16/24 0000  WBC 5.3 5.2   < > 9.9 8.5 8.4 7.7 7.3  NEUTROABS 3,058.00 3.5  --   --   --   --   --  4,249.00  HGB 11.1* 11.8*   < > 9.3* 10.2* 10.8* 12.1 12.3  HCT 33* 36.3   < > 27.6* 30.0* 32.6* 37 39  MCV  --  96.0   < > 92.3 91.7 90.8  --   --   PLT 223 216   < > 172 192 227 322 276   < > = values in this interval not displayed.   Lab Results  Component Value Date   TSH 0.77 09/23/2023   No results found for: "HGBA1C" Lab Results  Component Value Date   CHOL 191 09/23/2023   HDL 56 09/23/2023   LDLCALC 112 09/23/2023   TRIG 118 09/23/2023    Significant Diagnostic Results in last 30 days:  No results found.  Assessment/Plan 1. Major depressive disorder, recurrent severe without psychotic features (HCC) (Primary) Restart Wellbutrin  but will do Short acting dose of 75 mg QAM Possible Shuffling gait Change Zyprexa  to 2.5 mg Will eventually taper that and also reduce her Buspar  dose 2. Essential (primary) hypertension On Cozaar  and Aldactone   3. Primary insomnia Remeron     Family/ staff Communication: Son and Husband  Labs/tests ordered:

## 2024-03-28 DIAGNOSIS — S72001D Fracture of unspecified part of neck of right femur, subsequent encounter for closed fracture with routine healing: Secondary | ICD-10-CM | POA: Diagnosis not present

## 2024-03-28 DIAGNOSIS — R2681 Unsteadiness on feet: Secondary | ICD-10-CM | POA: Diagnosis not present

## 2024-03-28 DIAGNOSIS — M6281 Muscle weakness (generalized): Secondary | ICD-10-CM | POA: Diagnosis not present

## 2024-03-28 DIAGNOSIS — M25551 Pain in right hip: Secondary | ICD-10-CM | POA: Diagnosis not present

## 2024-03-29 DIAGNOSIS — F331 Major depressive disorder, recurrent, moderate: Secondary | ICD-10-CM | POA: Diagnosis not present

## 2024-03-29 DIAGNOSIS — M6281 Muscle weakness (generalized): Secondary | ICD-10-CM | POA: Diagnosis not present

## 2024-03-29 DIAGNOSIS — M25551 Pain in right hip: Secondary | ICD-10-CM | POA: Diagnosis not present

## 2024-03-29 DIAGNOSIS — S72001D Fracture of unspecified part of neck of right femur, subsequent encounter for closed fracture with routine healing: Secondary | ICD-10-CM | POA: Diagnosis not present

## 2024-03-30 DIAGNOSIS — R2681 Unsteadiness on feet: Secondary | ICD-10-CM | POA: Diagnosis not present

## 2024-03-30 DIAGNOSIS — M25551 Pain in right hip: Secondary | ICD-10-CM | POA: Diagnosis not present

## 2024-03-30 DIAGNOSIS — M6281 Muscle weakness (generalized): Secondary | ICD-10-CM | POA: Diagnosis not present

## 2024-03-30 DIAGNOSIS — S72001D Fracture of unspecified part of neck of right femur, subsequent encounter for closed fracture with routine healing: Secondary | ICD-10-CM | POA: Diagnosis not present

## 2024-04-01 DIAGNOSIS — M6281 Muscle weakness (generalized): Secondary | ICD-10-CM | POA: Diagnosis not present

## 2024-04-01 DIAGNOSIS — M25551 Pain in right hip: Secondary | ICD-10-CM | POA: Diagnosis not present

## 2024-04-01 DIAGNOSIS — S72001D Fracture of unspecified part of neck of right femur, subsequent encounter for closed fracture with routine healing: Secondary | ICD-10-CM | POA: Diagnosis not present

## 2024-04-01 DIAGNOSIS — F331 Major depressive disorder, recurrent, moderate: Secondary | ICD-10-CM | POA: Diagnosis not present

## 2024-04-02 ENCOUNTER — Other Ambulatory Visit: Payer: Self-pay | Admitting: Orthopedic Surgery

## 2024-04-02 DIAGNOSIS — R2681 Unsteadiness on feet: Secondary | ICD-10-CM | POA: Diagnosis not present

## 2024-04-02 DIAGNOSIS — S72001D Fracture of unspecified part of neck of right femur, subsequent encounter for closed fracture with routine healing: Secondary | ICD-10-CM | POA: Diagnosis not present

## 2024-04-02 DIAGNOSIS — M6281 Muscle weakness (generalized): Secondary | ICD-10-CM | POA: Diagnosis not present

## 2024-04-02 DIAGNOSIS — F419 Anxiety disorder, unspecified: Secondary | ICD-10-CM

## 2024-04-02 DIAGNOSIS — M25551 Pain in right hip: Secondary | ICD-10-CM | POA: Diagnosis not present

## 2024-04-02 MED ORDER — ALPRAZOLAM 0.5 MG PO TABS: 0.5000 mg | ORAL_TABLET | Freq: Two times a day (BID) | ORAL | Status: DC | PRN

## 2024-04-02 MED ORDER — ALPRAZOLAM 0.5 MG PO TABS: 0.5000 mg | ORAL_TABLET | Freq: Two times a day (BID) | ORAL | 0 refills | Status: DC | PRN

## 2024-04-03 DIAGNOSIS — S72001D Fracture of unspecified part of neck of right femur, subsequent encounter for closed fracture with routine healing: Secondary | ICD-10-CM | POA: Diagnosis not present

## 2024-04-03 DIAGNOSIS — R2681 Unsteadiness on feet: Secondary | ICD-10-CM | POA: Diagnosis not present

## 2024-04-03 DIAGNOSIS — M25551 Pain in right hip: Secondary | ICD-10-CM | POA: Diagnosis not present

## 2024-04-03 DIAGNOSIS — F331 Major depressive disorder, recurrent, moderate: Secondary | ICD-10-CM | POA: Diagnosis not present

## 2024-04-03 DIAGNOSIS — M6281 Muscle weakness (generalized): Secondary | ICD-10-CM | POA: Diagnosis not present

## 2024-04-05 DIAGNOSIS — S72001D Fracture of unspecified part of neck of right femur, subsequent encounter for closed fracture with routine healing: Secondary | ICD-10-CM | POA: Diagnosis not present

## 2024-04-05 DIAGNOSIS — M25551 Pain in right hip: Secondary | ICD-10-CM | POA: Diagnosis not present

## 2024-04-05 DIAGNOSIS — R2681 Unsteadiness on feet: Secondary | ICD-10-CM | POA: Diagnosis not present

## 2024-04-05 DIAGNOSIS — F331 Major depressive disorder, recurrent, moderate: Secondary | ICD-10-CM | POA: Diagnosis not present

## 2024-04-05 DIAGNOSIS — M6281 Muscle weakness (generalized): Secondary | ICD-10-CM | POA: Diagnosis not present

## 2024-04-11 DIAGNOSIS — S72001D Fracture of unspecified part of neck of right femur, subsequent encounter for closed fracture with routine healing: Secondary | ICD-10-CM | POA: Diagnosis not present

## 2024-04-11 DIAGNOSIS — F331 Major depressive disorder, recurrent, moderate: Secondary | ICD-10-CM | POA: Diagnosis not present

## 2024-04-11 DIAGNOSIS — M25551 Pain in right hip: Secondary | ICD-10-CM | POA: Diagnosis not present

## 2024-04-11 DIAGNOSIS — R2681 Unsteadiness on feet: Secondary | ICD-10-CM | POA: Diagnosis not present

## 2024-04-11 DIAGNOSIS — M6281 Muscle weakness (generalized): Secondary | ICD-10-CM | POA: Diagnosis not present

## 2024-04-12 DIAGNOSIS — M6281 Muscle weakness (generalized): Secondary | ICD-10-CM | POA: Diagnosis not present

## 2024-04-12 DIAGNOSIS — F331 Major depressive disorder, recurrent, moderate: Secondary | ICD-10-CM | POA: Diagnosis not present

## 2024-04-12 DIAGNOSIS — M25551 Pain in right hip: Secondary | ICD-10-CM | POA: Diagnosis not present

## 2024-04-12 DIAGNOSIS — R2681 Unsteadiness on feet: Secondary | ICD-10-CM | POA: Diagnosis not present

## 2024-04-12 DIAGNOSIS — S72001D Fracture of unspecified part of neck of right femur, subsequent encounter for closed fracture with routine healing: Secondary | ICD-10-CM | POA: Diagnosis not present

## 2024-04-13 DIAGNOSIS — F331 Major depressive disorder, recurrent, moderate: Secondary | ICD-10-CM | POA: Diagnosis not present

## 2024-04-13 DIAGNOSIS — M6281 Muscle weakness (generalized): Secondary | ICD-10-CM | POA: Diagnosis not present

## 2024-04-13 DIAGNOSIS — R2681 Unsteadiness on feet: Secondary | ICD-10-CM | POA: Diagnosis not present

## 2024-04-13 DIAGNOSIS — S72001D Fracture of unspecified part of neck of right femur, subsequent encounter for closed fracture with routine healing: Secondary | ICD-10-CM | POA: Diagnosis not present

## 2024-04-13 DIAGNOSIS — R41841 Cognitive communication deficit: Secondary | ICD-10-CM | POA: Diagnosis not present

## 2024-04-13 DIAGNOSIS — M25551 Pain in right hip: Secondary | ICD-10-CM | POA: Diagnosis not present

## 2024-04-16 DIAGNOSIS — R2681 Unsteadiness on feet: Secondary | ICD-10-CM | POA: Diagnosis not present

## 2024-04-16 DIAGNOSIS — M6281 Muscle weakness (generalized): Secondary | ICD-10-CM | POA: Diagnosis not present

## 2024-04-16 DIAGNOSIS — F331 Major depressive disorder, recurrent, moderate: Secondary | ICD-10-CM | POA: Diagnosis not present

## 2024-04-16 DIAGNOSIS — R41841 Cognitive communication deficit: Secondary | ICD-10-CM | POA: Diagnosis not present

## 2024-04-16 DIAGNOSIS — S72001D Fracture of unspecified part of neck of right femur, subsequent encounter for closed fracture with routine healing: Secondary | ICD-10-CM | POA: Diagnosis not present

## 2024-04-16 DIAGNOSIS — F339 Major depressive disorder, recurrent, unspecified: Secondary | ICD-10-CM | POA: Diagnosis not present

## 2024-04-16 DIAGNOSIS — M25551 Pain in right hip: Secondary | ICD-10-CM | POA: Diagnosis not present

## 2024-04-16 DIAGNOSIS — F419 Anxiety disorder, unspecified: Secondary | ICD-10-CM | POA: Diagnosis not present

## 2024-04-17 DIAGNOSIS — M25551 Pain in right hip: Secondary | ICD-10-CM | POA: Diagnosis not present

## 2024-04-17 DIAGNOSIS — S72001D Fracture of unspecified part of neck of right femur, subsequent encounter for closed fracture with routine healing: Secondary | ICD-10-CM | POA: Diagnosis not present

## 2024-04-17 DIAGNOSIS — F331 Major depressive disorder, recurrent, moderate: Secondary | ICD-10-CM | POA: Diagnosis not present

## 2024-04-17 DIAGNOSIS — M6281 Muscle weakness (generalized): Secondary | ICD-10-CM | POA: Diagnosis not present

## 2024-04-17 DIAGNOSIS — R2681 Unsteadiness on feet: Secondary | ICD-10-CM | POA: Diagnosis not present

## 2024-04-18 DIAGNOSIS — F419 Anxiety disorder, unspecified: Secondary | ICD-10-CM | POA: Diagnosis not present

## 2024-04-18 DIAGNOSIS — F339 Major depressive disorder, recurrent, unspecified: Secondary | ICD-10-CM | POA: Diagnosis not present

## 2024-04-18 DIAGNOSIS — R41841 Cognitive communication deficit: Secondary | ICD-10-CM | POA: Diagnosis not present

## 2024-04-20 DIAGNOSIS — F419 Anxiety disorder, unspecified: Secondary | ICD-10-CM | POA: Diagnosis not present

## 2024-04-20 DIAGNOSIS — M6281 Muscle weakness (generalized): Secondary | ICD-10-CM | POA: Diagnosis not present

## 2024-04-20 DIAGNOSIS — F339 Major depressive disorder, recurrent, unspecified: Secondary | ICD-10-CM | POA: Diagnosis not present

## 2024-04-20 DIAGNOSIS — R41841 Cognitive communication deficit: Secondary | ICD-10-CM | POA: Diagnosis not present

## 2024-04-20 DIAGNOSIS — R2681 Unsteadiness on feet: Secondary | ICD-10-CM | POA: Diagnosis not present

## 2024-04-20 DIAGNOSIS — F331 Major depressive disorder, recurrent, moderate: Secondary | ICD-10-CM | POA: Diagnosis not present

## 2024-04-20 DIAGNOSIS — S72001D Fracture of unspecified part of neck of right femur, subsequent encounter for closed fracture with routine healing: Secondary | ICD-10-CM | POA: Diagnosis not present

## 2024-04-20 DIAGNOSIS — M25551 Pain in right hip: Secondary | ICD-10-CM | POA: Diagnosis not present

## 2024-04-23 DIAGNOSIS — S72001D Fracture of unspecified part of neck of right femur, subsequent encounter for closed fracture with routine healing: Secondary | ICD-10-CM | POA: Diagnosis not present

## 2024-04-23 DIAGNOSIS — F419 Anxiety disorder, unspecified: Secondary | ICD-10-CM | POA: Diagnosis not present

## 2024-04-23 DIAGNOSIS — F331 Major depressive disorder, recurrent, moderate: Secondary | ICD-10-CM | POA: Diagnosis not present

## 2024-04-23 DIAGNOSIS — R41841 Cognitive communication deficit: Secondary | ICD-10-CM | POA: Diagnosis not present

## 2024-04-23 DIAGNOSIS — F339 Major depressive disorder, recurrent, unspecified: Secondary | ICD-10-CM | POA: Diagnosis not present

## 2024-04-23 DIAGNOSIS — M6281 Muscle weakness (generalized): Secondary | ICD-10-CM | POA: Diagnosis not present

## 2024-04-23 DIAGNOSIS — M25551 Pain in right hip: Secondary | ICD-10-CM | POA: Diagnosis not present

## 2024-04-23 DIAGNOSIS — R2681 Unsteadiness on feet: Secondary | ICD-10-CM | POA: Diagnosis not present

## 2024-04-24 DIAGNOSIS — R2681 Unsteadiness on feet: Secondary | ICD-10-CM | POA: Diagnosis not present

## 2024-04-24 DIAGNOSIS — S72001D Fracture of unspecified part of neck of right femur, subsequent encounter for closed fracture with routine healing: Secondary | ICD-10-CM | POA: Diagnosis not present

## 2024-04-24 DIAGNOSIS — M25551 Pain in right hip: Secondary | ICD-10-CM | POA: Diagnosis not present

## 2024-04-24 DIAGNOSIS — M6281 Muscle weakness (generalized): Secondary | ICD-10-CM | POA: Diagnosis not present

## 2024-04-25 ENCOUNTER — Non-Acute Institutional Stay: Payer: Self-pay | Admitting: Internal Medicine

## 2024-04-25 ENCOUNTER — Encounter: Payer: Self-pay | Admitting: Internal Medicine

## 2024-04-25 DIAGNOSIS — F332 Major depressive disorder, recurrent severe without psychotic features: Secondary | ICD-10-CM | POA: Diagnosis not present

## 2024-04-25 DIAGNOSIS — W19XXXA Unspecified fall, initial encounter: Secondary | ICD-10-CM

## 2024-04-25 DIAGNOSIS — R4189 Other symptoms and signs involving cognitive functions and awareness: Secondary | ICD-10-CM | POA: Diagnosis not present

## 2024-04-25 NOTE — Progress Notes (Signed)
 Location:  [facility] Room Number: AL14-A Place of Service:  ALF [phone]) Provider:  Charlanne Fredia CROME, MD  Patient Care Team: Charlanne Fredia CROME, MD as PCP - General (Internal Medicine) Pietro Redell RAMAN, MD as PCP - Cardiology (Cardiology) Duke, Jon Garre, PA as Physician Assistant (Cardiology)  Extended Emergency Contact Information Primary Emergency Contact: [facility] Phone: [phone] Relation: Son  Code Status:  Full Code Goals of care: Advanced Directive information    04/25/2024    3:42 PM  Advanced Directives  Does Patient Have a Medical Advance Directive? No  Would patient like information on creating a medical advance directive? No - Patient declined     Chief Complaint  Patient presents with   Acute Visit    HPI:  Pt is a 83 y.o. female seen today for an acute visit for Fall and Depression Also for Cognitive issues  Lives in [place] in [facility]   She also has a history of acute depression with weight loss, recurrent UTIs, hypertension, hyponatremia, severe anxiety, hypothyroidism     on Remeron  and Zyprexa  Also on Cymbalta  but gets hyponatremia on other SSRI  Depression Continues to be her issue.  She finally agreed to start the Wellbutrin  but short acting. Have not seen much improvement in past 4 weeks Fall Per patient and the husband in the room patient slid out of the bed hit her left side of the chest on the railing of the bed.  Complaining of some pain in that area.  No other injuries walking with her walker Cognitive impairment Patient had detail exam by ST and they found Significant cognitive impairment.  She had difficulty understanding the complex task Her slums was 74 out of 30 They also think her anxiety and depression is making it worse  Past Medical History:  Diagnosis Date   Adenomatous colon polyp    Adenomatous polyp of colon 12/03/2004   6mm   Anxiety    Chronic headaches    Chronic insomnia     Depression    Diverticulosis    External hemorrhoids    Hyperlipidemia    Hypertension    Hyperthyroidism    Hypothyroidism    Umbilical hernia    Past Surgical History:  Procedure Laterality Date   ABDOMINAL HYSTERECTOMY  1975   APPENDECTOMY  1975   COLONOSCOPY  03/03/2010,12/03/2004   ESOPHAGOGASTRODUODENOSCOPY  12/16/11   Dr. Lupita Commander   HIP ARTHROPLASTY Right 09/27/2023   Procedure: ARTHROPLASTY BIPOLAR HIP (HEMIARTHROPLASTY);  Surgeon: Vernetta Lonni GRADE, MD;  Location: [facility] OR;  Service: Orthopedics;  Laterality: Right;   OOPHORECTOMY     spinal lipoma removed  2006   TOTAL ABDOMINAL HYSTERECTOMY      Allergies  Allergen Reactions   Hydrocodone -Acetaminophen  Rash    Vicodin=REACTION: rash  Not listed on the MAR   Iodinated Contrast Media Hives and Rash    Per patient she has developed hives and itching in upper trunk of body after last 2 CT scans once she got homes that she has just treated at home with benadryl . She has not told anyone until now. States she was able to bear it  Other reaction(s): Rash Per patient she has developed hives and itching in upper trunk of body after last 2 CT scans once she got homes that she has just treated at home with benadryl . She has not told anyone until now. States she was able to bear it   Not listed on the [facility]  Sulfa Antibiotics Rash    REACTION: rash Other reaction(s): Unknown   Codeine     Unknown reaction  Not listed on the MAR    Sulfonamide Derivatives     REACTION: rash   Terbinafine     Unknown reaction  Not listed on the [facility]    Benazepril  Cough    Not listed on the [facility]     Outpatient Encounter Medications as of 04/25/2024  Medication Sig   acetaminophen  (TYLENOL ) 500 MG tablet Take 500 mg by mouth. Give 1000mg  by mouth as needed for pain TID PRN   ALPRAZolam  (XANAX ) 0.5 MG tablet Take 1 tablet (0.5 mg total) by mouth 2 (two) times daily as needed for anxiety.   amLODipine  (NORVASC ) 2.5 MG tablet Take 2.5  mg by mouth daily.   aspirin  EC 81 MG tablet Take 81 mg by mouth daily. Swallow whole.   buPROPion  (WELLBUTRIN ) 75 MG tablet Take 75 mg by mouth daily.   busPIRone  (BUSPAR ) 15 MG tablet Take 15 mg by mouth 3 (three) times daily. Second listing in [facility] states take three times daily   Docusate Sodium  100 MG capsule Take 100 mg by mouth as needed for constipation.   DULoxetine  (CYMBALTA ) 30 MG capsule Take 90 mg by mouth daily.   gabapentin  (NEURONTIN ) 100 MG capsule Take 200 mg by mouth 2 (two) times daily.   levothyroxine  (SYNTHROID ) 100 MCG tablet Take 100 mcg by mouth daily.   loperamide (IMODIUM A-D) 2 MG tablet Take 2 mg by mouth every 8 (eight) hours as needed for diarrhea or loose stools.   losartan  (COZAAR ) 50 MG tablet Take 50 mg by mouth 2 (two) times daily.   magnesium hydroxide (MILK OF MAGNESIA) 400 MG/5ML suspension Take 30 mLs by mouth daily as needed for mild constipation.   methenamine  (HIPREX ) 1 g tablet Take 1 g by mouth 2 (two) times daily.   metoprolol  succinate (TOPROL  XL) 25 MG 24 hr tablet Take 1 tablet (25 mg total) by mouth daily.   mirtazapine  (REMERON ) 15 MG tablet Take 15 mg by mouth at bedtime.   OLANZapine  (ZYPREXA ) 2.5 MG tablet Take 2.5 mg by mouth at bedtime.   ondansetron  (ZOFRAN ) 4 MG tablet Take 4 mg by mouth daily as needed for nausea or vomiting. Give 2 tablet by mouth every 24 hours as needed for nausea?vomit   polyethylene glycol (MIRALAX ) 17 g packet Take 17 g by mouth daily.   saccharomyces boulardii (FLORASTOR) 250 MG capsule Take 250 mg by mouth 2 (two) times daily.   senna (SENOKOT) 8.6 MG TABS tablet Take 1 tablet by mouth daily. May also take 2 tablets by mouth as needed for constipation   spironolactone  (ALDACTONE ) 12.5 mg TABS tablet Take 12.5 mg by mouth daily.   [DISCONTINUED] OLANZapine  (ZYPREXA ) 5 MG tablet Take 2.5 mg by mouth daily. (Patient not taking: Reported on 04/25/2024)   No facility-administered encounter medications on  file as of 04/25/2024.    Review of Systems  Constitutional:  Negative for activity change and appetite change.  HENT: Negative.    Respiratory:  Negative for cough and shortness of breath.   Cardiovascular:  Negative for leg swelling.  Gastrointestinal:  Negative for constipation.  Genitourinary: Negative.   Musculoskeletal:  Positive for gait problem. Negative for arthralgias and myalgias.  Skin: Negative.   Neurological:  Positive for weakness. Negative for dizziness.  Psychiatric/Behavioral:  Positive for confusion and dysphoric mood. Negative for sleep disturbance. The patient is nervous/anxious.  Immunization History  Administered Date(s) Administered   Fluad Quad(high Dose 65+) 12/07/2023   Influenza, High Dose Seasonal PF 08/04/2017, 07/27/2018, 07/27/2020, 08/06/2021   Influenza,inj,Quad PF,6-35 Mos 12/26/2019   Moderna Covid-19 Fall Seasonal Vaccine 61yrs & older 12/14/2023   PFIZER Comirnaty(Gray Top)Covid-19 Tri-Sucrose Vaccine 04/22/2021   PFIZER(Purple Top)SARS-COV-2 Vaccination 11/17/2019, 12/06/2019   PPD Test 10/13/2020   Pfizer Covid-19 Vaccine Bivalent Booster 25yrs & up 08/27/2021   Tdap 02/15/2022   Zoster Recombinant(Shingrix) 01/18/2018, 03/21/2018, 04/20/2018   Pertinent  Health Maintenance Due  Topic Date Due   DEXA SCAN  Never done   INFLUENZA VACCINE  06/01/2024      09/26/2020    3:57 PM 08/07/2021    1:56 PM 02/15/2022    2:38 PM 03/17/2023    3:02 PM 02/13/2024    1:41 PM  Fall Risk  Falls in the past year?    0 1  Was there an injury with Fall?    0 1  Fall Risk Category Calculator    0 3  (RETIRED) Patient Fall Risk Level High fall risk  Low fall risk  Moderate fall risk     Patient at Risk for Falls Due to    No Fall Risks History of fall(s)  Fall risk Follow up    Falls evaluation completed Falls evaluation completed;Education provided     Data saved with a previous flowsheet row definition   Functional Status Survey:    Vitals:    04/25/24 1539  BP: (!) 156/74  Pulse: 60  Resp: 18  Temp: 98.6 F (37 C)  SpO2: 96%  Weight: 146 lb 3.2 oz (66.3 kg)  Height: 5' 4 (1.626 m)   Body mass index is 25.1 kg/m. Physical Exam Vitals reviewed.  Constitutional:      Appearance: Normal appearance.  HENT:     Head: Normocephalic.     Nose: Nose normal.     Mouth/Throat:     Mouth: Mucous membranes are moist.     Pharynx: Oropharynx is clear.   Eyes:     Pupils: Pupils are equal, round, and reactive to light.    Cardiovascular:     Rate and Rhythm: Normal rate and regular rhythm.     Pulses: Normal pulses.     Heart sounds: Normal heart sounds. No murmur heard. Pulmonary:     Effort: Pulmonary effort is normal.     Breath sounds: Normal breath sounds.  Abdominal:     General: Abdomen is flat. Bowel sounds are normal.     Palpations: Abdomen is soft.   Musculoskeletal:        General: No swelling.     Cervical back: Neck supple.     Comments: No Injuries noticed due to Fall   Skin:    General: Skin is warm.   Neurological:     General: No focal deficit present.     Mental Status: She is alert and oriented to person, place, and time.   Psychiatric:        Mood and Affect: Mood normal.        Thought Content: Thought content normal.     Labs reviewed: Recent Labs    06/13/23 0403 06/14/23 0357 06/15/23 0340 08/07/23 1245 09/10/23 1823 09/23/23 0000 09/29/23 0524 09/30/23 0614 10/01/23 0340 11/17/23 0000 12/13/23 0000 02/16/24 0000 03/08/24 0000  NA 128* 131* 133*   < > 130*   < > 132* 134* 135   < > 138 130* 130*  K  4.0 4.0 3.8   < > 4.3   < > 3.9 3.6 3.7   < > 4.4 5.0 4.5  CL 99 100 102   < > 96*   < > 104 101 104   < > 104 98* 97*  CO2 21* 25 25   < > 25   < > 25 23 25    < > 29* 25* 25*  GLUCOSE 92 92 95   < > 91   < > 89 89 108*  --   --   --   --   BUN 14 12 11    < > 10   < > 18 16 12    < > 17 18 16   CREATININE 1.36* 0.93 0.73   < > 1.02*   < > 1.00 0.79 0.78   < > 0.8 0.9 1.0   CALCIUM  9.2 8.8* 8.7*   < > 10.3   < > 8.7* 9.0 9.0   < > 9.2 9.6 9.6  MG 2.3 2.0 2.0  --  1.9  --   --   --   --   --   --   --   --   PHOS 3.9 3.3 3.6  --   --   --   --   --   --   --   --   --   --    < > = values in this interval not displayed.   Recent Labs    08/07/23 1245 09/08/23 1532 09/23/23 0000 09/26/23 2043  AST 21 21 19 29   ALT 15 18 14 23   ALKPHOS 60 76 76 82  BILITOT 1.0 0.7  --  0.7  PROT 6.6 7.3  --  6.6  ALBUMIN  4.0 4.3 3.7 3.6   Recent Labs    09/23/23 0000 09/26/23 2043 09/27/23 0607 09/29/23 0524 09/30/23 0614 10/01/23 0340 01/26/24 0000 02/16/24 0000  WBC 5.3 5.2   < > 9.9 8.5 8.4 7.7 7.3  NEUTROABS 3,058.00 3.5  --   --   --   --   --  4,249.00  HGB 11.1* 11.8*   < > 9.3* 10.2* 10.8* 12.1 12.3  HCT 33* 36.3   < > 27.6* 30.0* 32.6* 37 39  MCV  --  96.0   < > 92.3 91.7 90.8  --   --   PLT 223 216   < > 172 192 227 322 276   < > = values in this interval not displayed.   Lab Results  Component Value Date   TSH 0.77 09/23/2023   No results found for: HGBA1C Lab Results  Component Value Date   CHOL 191 09/23/2023   HDL 56 09/23/2023   LDLCALC 112 09/23/2023   TRIG 118 09/23/2023    Significant Diagnostic Results in last 30 days:  No results found.  Assessment/Plan 1. Major depressive disorder, recurrent severe without psychotic features (HCC) (Primary) Change Wellbutrin  to 100 mg and decrease Buspar  to 10 mg during daytime to see if it helps her being more active  during daytime  2. Fall, initial encounter No injuries Continue to work with Therapy She refuses sometimes  3. Cognitive impairment Slums 19/30 per ST Some of her cognition worsening is due to Chronic depression She does need supervision for her ADLS like dressing and shower Also need help with Complex Task She also need Med Management  Also d/w POA about further work up possible referral to Neurology for Mild Dementia. He  will talk to his family and would let me  know    Family/ staff Communication:   Labs/tests ordered:

## 2024-04-27 ENCOUNTER — Telehealth: Payer: Self-pay

## 2024-04-27 DIAGNOSIS — S72001D Fracture of unspecified part of neck of right femur, subsequent encounter for closed fracture with routine healing: Secondary | ICD-10-CM | POA: Diagnosis not present

## 2024-04-27 DIAGNOSIS — M6281 Muscle weakness (generalized): Secondary | ICD-10-CM | POA: Diagnosis not present

## 2024-04-27 DIAGNOSIS — M25551 Pain in right hip: Secondary | ICD-10-CM | POA: Diagnosis not present

## 2024-04-27 DIAGNOSIS — F331 Major depressive disorder, recurrent, moderate: Secondary | ICD-10-CM | POA: Diagnosis not present

## 2024-04-27 NOTE — Telephone Encounter (Signed)
 Copied from CRM [id number]. Topic: General - Other >> Apr 27, 2024 10:44 AM Miquel SAILOR wrote: Reason for CRM: Patient Debbie Wells son returning call from PCP in regards to Exam for patient results he stated. Called office but instructed to schedule app 07/09 07/016. Son denied scheduling app sated he already spoke to PCP on this issue. Nees to go over her exam ASAP. Needs call back [phone]. Please Advise   Message sent to Charlanne Fredia CROME, MD

## 2024-04-27 NOTE — Telephone Encounter (Signed)
 This encounter was created in error - please disregard.

## 2024-04-30 DIAGNOSIS — S72001D Fracture of unspecified part of neck of right femur, subsequent encounter for closed fracture with routine healing: Secondary | ICD-10-CM | POA: Diagnosis not present

## 2024-04-30 DIAGNOSIS — R2681 Unsteadiness on feet: Secondary | ICD-10-CM | POA: Diagnosis not present

## 2024-04-30 DIAGNOSIS — M25551 Pain in right hip: Secondary | ICD-10-CM | POA: Diagnosis not present

## 2024-04-30 DIAGNOSIS — M6281 Muscle weakness (generalized): Secondary | ICD-10-CM | POA: Diagnosis not present

## 2024-05-01 DIAGNOSIS — M6281 Muscle weakness (generalized): Secondary | ICD-10-CM | POA: Diagnosis not present

## 2024-05-01 DIAGNOSIS — S72001D Fracture of unspecified part of neck of right femur, subsequent encounter for closed fracture with routine healing: Secondary | ICD-10-CM | POA: Diagnosis not present

## 2024-05-01 DIAGNOSIS — M25551 Pain in right hip: Secondary | ICD-10-CM | POA: Diagnosis not present

## 2024-05-01 DIAGNOSIS — R2681 Unsteadiness on feet: Secondary | ICD-10-CM | POA: Diagnosis not present

## 2024-05-02 ENCOUNTER — Other Ambulatory Visit: Payer: Self-pay | Admitting: Orthopedic Surgery

## 2024-05-02 DIAGNOSIS — M6281 Muscle weakness (generalized): Secondary | ICD-10-CM | POA: Diagnosis not present

## 2024-05-02 DIAGNOSIS — M25551 Pain in right hip: Secondary | ICD-10-CM | POA: Diagnosis not present

## 2024-05-02 DIAGNOSIS — S72001D Fracture of unspecified part of neck of right femur, subsequent encounter for closed fracture with routine healing: Secondary | ICD-10-CM | POA: Diagnosis not present

## 2024-05-02 DIAGNOSIS — F419 Anxiety disorder, unspecified: Secondary | ICD-10-CM

## 2024-05-02 DIAGNOSIS — F331 Major depressive disorder, recurrent, moderate: Secondary | ICD-10-CM | POA: Diagnosis not present

## 2024-05-02 MED ORDER — ALPRAZOLAM 0.5 MG PO TABS
0.5000 mg | ORAL_TABLET | Freq: Two times a day (BID) | ORAL | 0 refills | Status: DC | PRN
Start: 2024-05-02 — End: 2024-06-04

## 2024-05-03 DIAGNOSIS — R41841 Cognitive communication deficit: Secondary | ICD-10-CM | POA: Diagnosis not present

## 2024-05-03 DIAGNOSIS — F419 Anxiety disorder, unspecified: Secondary | ICD-10-CM | POA: Diagnosis not present

## 2024-05-03 DIAGNOSIS — F339 Major depressive disorder, recurrent, unspecified: Secondary | ICD-10-CM | POA: Diagnosis not present

## 2024-05-04 DIAGNOSIS — M6281 Muscle weakness (generalized): Secondary | ICD-10-CM | POA: Diagnosis not present

## 2024-05-04 DIAGNOSIS — S72001D Fracture of unspecified part of neck of right femur, subsequent encounter for closed fracture with routine healing: Secondary | ICD-10-CM | POA: Diagnosis not present

## 2024-05-04 DIAGNOSIS — R2681 Unsteadiness on feet: Secondary | ICD-10-CM | POA: Diagnosis not present

## 2024-05-04 DIAGNOSIS — M25551 Pain in right hip: Secondary | ICD-10-CM | POA: Diagnosis not present

## 2024-05-07 DIAGNOSIS — F339 Major depressive disorder, recurrent, unspecified: Secondary | ICD-10-CM | POA: Diagnosis not present

## 2024-05-07 DIAGNOSIS — M6281 Muscle weakness (generalized): Secondary | ICD-10-CM | POA: Diagnosis not present

## 2024-05-07 DIAGNOSIS — S72001D Fracture of unspecified part of neck of right femur, subsequent encounter for closed fracture with routine healing: Secondary | ICD-10-CM | POA: Diagnosis not present

## 2024-05-07 DIAGNOSIS — R2681 Unsteadiness on feet: Secondary | ICD-10-CM | POA: Diagnosis not present

## 2024-05-07 DIAGNOSIS — R41841 Cognitive communication deficit: Secondary | ICD-10-CM | POA: Diagnosis not present

## 2024-05-07 DIAGNOSIS — F419 Anxiety disorder, unspecified: Secondary | ICD-10-CM | POA: Diagnosis not present

## 2024-05-07 DIAGNOSIS — M25551 Pain in right hip: Secondary | ICD-10-CM | POA: Diagnosis not present

## 2024-05-08 DIAGNOSIS — F419 Anxiety disorder, unspecified: Secondary | ICD-10-CM | POA: Diagnosis not present

## 2024-05-08 DIAGNOSIS — F339 Major depressive disorder, recurrent, unspecified: Secondary | ICD-10-CM | POA: Diagnosis not present

## 2024-05-08 DIAGNOSIS — R41841 Cognitive communication deficit: Secondary | ICD-10-CM | POA: Diagnosis not present

## 2024-05-09 DIAGNOSIS — M25551 Pain in right hip: Secondary | ICD-10-CM | POA: Diagnosis not present

## 2024-05-09 DIAGNOSIS — S72001D Fracture of unspecified part of neck of right femur, subsequent encounter for closed fracture with routine healing: Secondary | ICD-10-CM | POA: Diagnosis not present

## 2024-05-09 DIAGNOSIS — M6281 Muscle weakness (generalized): Secondary | ICD-10-CM | POA: Diagnosis not present

## 2024-05-09 DIAGNOSIS — F331 Major depressive disorder, recurrent, moderate: Secondary | ICD-10-CM | POA: Diagnosis not present

## 2024-05-10 DIAGNOSIS — R41841 Cognitive communication deficit: Secondary | ICD-10-CM | POA: Diagnosis not present

## 2024-05-10 DIAGNOSIS — S72001D Fracture of unspecified part of neck of right femur, subsequent encounter for closed fracture with routine healing: Secondary | ICD-10-CM | POA: Diagnosis not present

## 2024-05-10 DIAGNOSIS — R2681 Unsteadiness on feet: Secondary | ICD-10-CM | POA: Diagnosis not present

## 2024-05-10 DIAGNOSIS — M25551 Pain in right hip: Secondary | ICD-10-CM | POA: Diagnosis not present

## 2024-05-10 DIAGNOSIS — M6281 Muscle weakness (generalized): Secondary | ICD-10-CM | POA: Diagnosis not present

## 2024-05-10 DIAGNOSIS — F419 Anxiety disorder, unspecified: Secondary | ICD-10-CM | POA: Diagnosis not present

## 2024-05-10 DIAGNOSIS — F339 Major depressive disorder, recurrent, unspecified: Secondary | ICD-10-CM | POA: Diagnosis not present

## 2024-05-11 DIAGNOSIS — M25551 Pain in right hip: Secondary | ICD-10-CM | POA: Diagnosis not present

## 2024-05-11 DIAGNOSIS — M6281 Muscle weakness (generalized): Secondary | ICD-10-CM | POA: Diagnosis not present

## 2024-05-11 DIAGNOSIS — S72001D Fracture of unspecified part of neck of right femur, subsequent encounter for closed fracture with routine healing: Secondary | ICD-10-CM | POA: Diagnosis not present

## 2024-05-11 DIAGNOSIS — R2681 Unsteadiness on feet: Secondary | ICD-10-CM | POA: Diagnosis not present

## 2024-05-14 DIAGNOSIS — F419 Anxiety disorder, unspecified: Secondary | ICD-10-CM | POA: Diagnosis not present

## 2024-05-14 DIAGNOSIS — R41841 Cognitive communication deficit: Secondary | ICD-10-CM | POA: Diagnosis not present

## 2024-05-14 DIAGNOSIS — F339 Major depressive disorder, recurrent, unspecified: Secondary | ICD-10-CM | POA: Diagnosis not present

## 2024-05-16 DIAGNOSIS — S72001D Fracture of unspecified part of neck of right femur, subsequent encounter for closed fracture with routine healing: Secondary | ICD-10-CM | POA: Diagnosis not present

## 2024-05-16 DIAGNOSIS — M6281 Muscle weakness (generalized): Secondary | ICD-10-CM | POA: Diagnosis not present

## 2024-05-16 DIAGNOSIS — R2681 Unsteadiness on feet: Secondary | ICD-10-CM | POA: Diagnosis not present

## 2024-05-16 DIAGNOSIS — F331 Major depressive disorder, recurrent, moderate: Secondary | ICD-10-CM | POA: Diagnosis not present

## 2024-05-16 DIAGNOSIS — M25551 Pain in right hip: Secondary | ICD-10-CM | POA: Diagnosis not present

## 2024-05-17 ENCOUNTER — Other Ambulatory Visit: Payer: Self-pay | Admitting: Internal Medicine

## 2024-05-17 DIAGNOSIS — F332 Major depressive disorder, recurrent severe without psychotic features: Secondary | ICD-10-CM

## 2024-05-17 DIAGNOSIS — R4189 Other symptoms and signs involving cognitive functions and awareness: Secondary | ICD-10-CM

## 2024-05-17 DIAGNOSIS — F419 Anxiety disorder, unspecified: Secondary | ICD-10-CM

## 2024-05-21 DIAGNOSIS — R2681 Unsteadiness on feet: Secondary | ICD-10-CM | POA: Diagnosis not present

## 2024-05-21 DIAGNOSIS — M6281 Muscle weakness (generalized): Secondary | ICD-10-CM | POA: Diagnosis not present

## 2024-05-21 DIAGNOSIS — M25551 Pain in right hip: Secondary | ICD-10-CM | POA: Diagnosis not present

## 2024-05-21 DIAGNOSIS — S72001D Fracture of unspecified part of neck of right femur, subsequent encounter for closed fracture with routine healing: Secondary | ICD-10-CM | POA: Diagnosis not present

## 2024-05-23 DIAGNOSIS — M6281 Muscle weakness (generalized): Secondary | ICD-10-CM | POA: Diagnosis not present

## 2024-05-23 DIAGNOSIS — S72001D Fracture of unspecified part of neck of right femur, subsequent encounter for closed fracture with routine healing: Secondary | ICD-10-CM | POA: Diagnosis not present

## 2024-05-23 DIAGNOSIS — M25551 Pain in right hip: Secondary | ICD-10-CM | POA: Diagnosis not present

## 2024-05-23 DIAGNOSIS — F331 Major depressive disorder, recurrent, moderate: Secondary | ICD-10-CM | POA: Diagnosis not present

## 2024-05-24 DIAGNOSIS — R2681 Unsteadiness on feet: Secondary | ICD-10-CM | POA: Diagnosis not present

## 2024-05-24 DIAGNOSIS — M6281 Muscle weakness (generalized): Secondary | ICD-10-CM | POA: Diagnosis not present

## 2024-05-24 DIAGNOSIS — M25551 Pain in right hip: Secondary | ICD-10-CM | POA: Diagnosis not present

## 2024-05-24 DIAGNOSIS — S72001D Fracture of unspecified part of neck of right femur, subsequent encounter for closed fracture with routine healing: Secondary | ICD-10-CM | POA: Diagnosis not present

## 2024-05-29 DIAGNOSIS — M6281 Muscle weakness (generalized): Secondary | ICD-10-CM | POA: Diagnosis not present

## 2024-05-29 DIAGNOSIS — M25551 Pain in right hip: Secondary | ICD-10-CM | POA: Diagnosis not present

## 2024-05-29 DIAGNOSIS — R2681 Unsteadiness on feet: Secondary | ICD-10-CM | POA: Diagnosis not present

## 2024-05-29 DIAGNOSIS — S72001D Fracture of unspecified part of neck of right femur, subsequent encounter for closed fracture with routine healing: Secondary | ICD-10-CM | POA: Diagnosis not present

## 2024-05-30 DIAGNOSIS — S72001D Fracture of unspecified part of neck of right femur, subsequent encounter for closed fracture with routine healing: Secondary | ICD-10-CM | POA: Diagnosis not present

## 2024-05-30 DIAGNOSIS — M25551 Pain in right hip: Secondary | ICD-10-CM | POA: Diagnosis not present

## 2024-05-30 DIAGNOSIS — F331 Major depressive disorder, recurrent, moderate: Secondary | ICD-10-CM | POA: Diagnosis not present

## 2024-05-30 DIAGNOSIS — M6281 Muscle weakness (generalized): Secondary | ICD-10-CM | POA: Diagnosis not present

## 2024-05-31 DIAGNOSIS — F331 Major depressive disorder, recurrent, moderate: Secondary | ICD-10-CM | POA: Diagnosis not present

## 2024-06-01 DIAGNOSIS — M6281 Muscle weakness (generalized): Secondary | ICD-10-CM | POA: Diagnosis not present

## 2024-06-01 DIAGNOSIS — R2681 Unsteadiness on feet: Secondary | ICD-10-CM | POA: Diagnosis not present

## 2024-06-01 DIAGNOSIS — S72001D Fracture of unspecified part of neck of right femur, subsequent encounter for closed fracture with routine healing: Secondary | ICD-10-CM | POA: Diagnosis not present

## 2024-06-01 DIAGNOSIS — M25551 Pain in right hip: Secondary | ICD-10-CM | POA: Diagnosis not present

## 2024-06-04 ENCOUNTER — Other Ambulatory Visit: Payer: Self-pay | Admitting: Orthopedic Surgery

## 2024-06-04 DIAGNOSIS — F419 Anxiety disorder, unspecified: Secondary | ICD-10-CM

## 2024-06-04 MED ORDER — ALPRAZOLAM 0.5 MG PO TABS: 0.5000 mg | ORAL_TABLET | Freq: Two times a day (BID) | ORAL | 0 refills | Status: DC | PRN

## 2024-06-05 DIAGNOSIS — S72001D Fracture of unspecified part of neck of right femur, subsequent encounter for closed fracture with routine healing: Secondary | ICD-10-CM | POA: Diagnosis not present

## 2024-06-05 DIAGNOSIS — M25551 Pain in right hip: Secondary | ICD-10-CM | POA: Diagnosis not present

## 2024-06-05 DIAGNOSIS — R2681 Unsteadiness on feet: Secondary | ICD-10-CM | POA: Diagnosis not present

## 2024-06-05 DIAGNOSIS — F411 Generalized anxiety disorder: Secondary | ICD-10-CM | POA: Diagnosis not present

## 2024-06-05 DIAGNOSIS — M6281 Muscle weakness (generalized): Secondary | ICD-10-CM | POA: Diagnosis not present

## 2024-06-06 DIAGNOSIS — F331 Major depressive disorder, recurrent, moderate: Secondary | ICD-10-CM | POA: Diagnosis not present

## 2024-06-06 DIAGNOSIS — S72001D Fracture of unspecified part of neck of right femur, subsequent encounter for closed fracture with routine healing: Secondary | ICD-10-CM | POA: Diagnosis not present

## 2024-06-06 DIAGNOSIS — M6281 Muscle weakness (generalized): Secondary | ICD-10-CM | POA: Diagnosis not present

## 2024-06-06 DIAGNOSIS — M25551 Pain in right hip: Secondary | ICD-10-CM | POA: Diagnosis not present

## 2024-06-08 DIAGNOSIS — M6281 Muscle weakness (generalized): Secondary | ICD-10-CM | POA: Diagnosis not present

## 2024-06-08 DIAGNOSIS — M25551 Pain in right hip: Secondary | ICD-10-CM | POA: Diagnosis not present

## 2024-06-08 DIAGNOSIS — S72001D Fracture of unspecified part of neck of right femur, subsequent encounter for closed fracture with routine healing: Secondary | ICD-10-CM | POA: Diagnosis not present

## 2024-06-08 DIAGNOSIS — R2681 Unsteadiness on feet: Secondary | ICD-10-CM | POA: Diagnosis not present

## 2024-06-12 DIAGNOSIS — S72001D Fracture of unspecified part of neck of right femur, subsequent encounter for closed fracture with routine healing: Secondary | ICD-10-CM | POA: Diagnosis not present

## 2024-06-12 DIAGNOSIS — R2681 Unsteadiness on feet: Secondary | ICD-10-CM | POA: Diagnosis not present

## 2024-06-12 DIAGNOSIS — F411 Generalized anxiety disorder: Secondary | ICD-10-CM | POA: Diagnosis not present

## 2024-06-12 DIAGNOSIS — M25551 Pain in right hip: Secondary | ICD-10-CM | POA: Diagnosis not present

## 2024-06-12 DIAGNOSIS — M6281 Muscle weakness (generalized): Secondary | ICD-10-CM | POA: Diagnosis not present

## 2024-06-13 DIAGNOSIS — M6281 Muscle weakness (generalized): Secondary | ICD-10-CM | POA: Diagnosis not present

## 2024-06-13 DIAGNOSIS — M25551 Pain in right hip: Secondary | ICD-10-CM | POA: Diagnosis not present

## 2024-06-13 DIAGNOSIS — S72001D Fracture of unspecified part of neck of right femur, subsequent encounter for closed fracture with routine healing: Secondary | ICD-10-CM | POA: Diagnosis not present

## 2024-06-13 DIAGNOSIS — F331 Major depressive disorder, recurrent, moderate: Secondary | ICD-10-CM | POA: Diagnosis not present

## 2024-06-13 DIAGNOSIS — Z961 Presence of intraocular lens: Secondary | ICD-10-CM | POA: Diagnosis not present

## 2024-06-13 DIAGNOSIS — H04123 Dry eye syndrome of bilateral lacrimal glands: Secondary | ICD-10-CM | POA: Diagnosis not present

## 2024-06-15 ENCOUNTER — Encounter: Payer: Self-pay | Admitting: Orthopedic Surgery

## 2024-06-15 DIAGNOSIS — R2681 Unsteadiness on feet: Secondary | ICD-10-CM | POA: Diagnosis not present

## 2024-06-15 DIAGNOSIS — M6281 Muscle weakness (generalized): Secondary | ICD-10-CM | POA: Diagnosis not present

## 2024-06-15 DIAGNOSIS — S72001D Fracture of unspecified part of neck of right femur, subsequent encounter for closed fracture with routine healing: Secondary | ICD-10-CM | POA: Diagnosis not present

## 2024-06-15 DIAGNOSIS — M25551 Pain in right hip: Secondary | ICD-10-CM | POA: Diagnosis not present

## 2024-06-15 DIAGNOSIS — Z0000 Encounter for general adult medical examination without abnormal findings: Secondary | ICD-10-CM

## 2024-06-18 NOTE — Progress Notes (Deleted)
 Subjective:   Debbie Wells is a 83 y.o. female who presents for Medicare Annual (Subsequent) preventive examination.  Visit Complete: In person  Patient Medicare AWV questionnaire was completed by the patient on 06/18/2024; I have confirmed that all information answered by patient is correct and no changes since this date.        Objective:    Today's Vitals   06/15/24 1129 06/18/24 1001  BP: 131/70   Pulse: (!) 55   Resp: 18   Temp: 97.7 F (36.5 C)   SpO2: 97%   Weight: 142 lb (64.4 kg)   Height: 5' 4 (1.626 m)   PainSc:  0-No pain   Body mass index is 24.37 kg/m.     06/15/2024   12:19 PM 04/25/2024    3:42 PM 12/23/2023    1:55 PM 12/20/2023    8:32 AM 11/29/2023    3:34 PM 11/11/2023   10:51 AM 10/04/2023   11:49 AM  Advanced Directives  Does Patient Have a Medical Advance Directive? No No No No No No No  Does patient want to make changes to medical advance directive?   No - Patient declined No - Patient declined No - Patient declined No - Patient declined No - Patient declined  Would patient like information on creating a medical advance directive? No - Patient declined No - Patient declined  No - Patient declined   No - Patient declined    Current Medications (verified) Outpatient Encounter Medications as of 06/15/2024  Medication Sig   amLODipine  (NORVASC ) 2.5 MG tablet Take 2.5 mg by mouth daily.   aspirin  EC 81 MG tablet Take 81 mg by mouth daily. Swallow whole.   buPROPion  (WELLBUTRIN ) 100 MG tablet Take 100 mg by mouth daily.   Carboxymethylcellulose Sodium (REFRESH TEARS OP) Place 1 drop into both eyes as needed.   DULoxetine  (CYMBALTA ) 30 MG capsule Take 90 mg by mouth daily.   gabapentin  (NEURONTIN ) 100 MG capsule Take 200 mg by mouth 2 (two) times daily.   levothyroxine  (SYNTHROID ) 100 MCG tablet Take 100 mcg by mouth daily.   losartan  (COZAAR ) 50 MG tablet Take 50 mg by mouth 2 (two) times daily.   methenamine  (HIPREX ) 1 g tablet Take 1 g by mouth  2 (two) times daily.   metoprolol  succinate (TOPROL -XL) 25 MG 24 hr tablet Take 25 mg by mouth 2 (two) times daily.   mirtazapine  (REMERON ) 30 MG tablet Take 30 mg by mouth at bedtime.   OLANZapine  (ZYPREXA ) 2.5 MG tablet Take 2.5 mg by mouth at bedtime.   polyethylene glycol (MIRALAX ) 17 g packet Take 17 g by mouth daily.   saccharomyces boulardii (FLORASTOR) 250 MG capsule Take 250 mg by mouth 2 (two) times daily.   senna (SENOKOT) 8.6 MG TABS tablet Take 1 tablet by mouth daily. May also take 2 tablets by mouth as needed for constipation   senna (SENOKOT) 8.6 MG TABS tablet Take 2 tablets by mouth daily as needed for mild constipation.   spironolactone  (ALDACTONE ) 12.5 mg TABS tablet Take 12.5 mg by mouth daily.   acetaminophen  (TYLENOL ) 500 MG tablet Take 500 mg by mouth. Give 1000mg  by mouth as needed for pain TID PRN   ALPRAZolam  (XANAX ) 0.5 MG tablet Take 1 tablet (0.5 mg total) by mouth 2 (two) times daily as needed for anxiety.   busPIRone  HCl 10 MG CAPS Take 15 mg by mouth in the morning and at bedtime.   Docusate Sodium  100 MG capsule  Take 100 mg by mouth as needed for constipation.   loperamide (IMODIUM A-D) 2 MG tablet Take 2 mg by mouth every 8 (eight) hours as needed for diarrhea or loose stools.   magnesium hydroxide (MILK OF MAGNESIA) 400 MG/5ML suspension Take 30 mLs by mouth daily as needed for mild constipation.   metoprolol  succinate (TOPROL  XL) 25 MG 24 hr tablet Take 1 tablet (25 mg total) by mouth daily. (Patient not taking: Reported on 06/15/2024)   mirtazapine  (REMERON ) 15 MG tablet Take 15 mg by mouth at bedtime. (Patient not taking: Reported on 06/15/2024)   ondansetron  (ZOFRAN ) 4 MG tablet Take 4 mg by mouth daily as needed for nausea or vomiting. Give 2 tablet by mouth every 24 hours as needed for nausea?vomit   No facility-administered encounter medications on file as of 06/15/2024.    Allergies (verified) Hydrocodone -acetaminophen , Iodinated contrast media, Sulfa  antibiotics, Codeine, Sulfonamide derivatives, Terbinafine, and Benazepril    History: Past Medical History:  Diagnosis Date   Adenomatous colon polyp    Adenomatous polyp of colon 12/03/2004   6mm   Anxiety    Chronic headaches    Chronic insomnia    Depression    Diverticulosis    External hemorrhoids    Hyperlipidemia    Hypertension    Hyperthyroidism    Hypothyroidism    Umbilical hernia    Past Surgical History:  Procedure Laterality Date   ABDOMINAL HYSTERECTOMY  1975   APPENDECTOMY  1975   COLONOSCOPY  03/03/2010,12/03/2004   ESOPHAGOGASTRODUODENOSCOPY  12/16/11   Dr. Lupita Commander   HIP ARTHROPLASTY Right 09/27/2023   Procedure: ARTHROPLASTY BIPOLAR HIP (HEMIARTHROPLASTY);  Surgeon: Vernetta Lonni GRADE, MD;  Location: [facility] OR;  Service: Orthopedics;  Laterality: Right;   OOPHORECTOMY     spinal lipoma removed  2006   TOTAL ABDOMINAL HYSTERECTOMY     Family History  Problem Relation Age of Onset   Esophageal cancer Father    Stomach cancer Father        mets from esophagus   Heart disease Mother    Irritable bowel syndrome Mother    Kidney disease Mother    Other Mother        cushings disease   Addison's disease Mother    Heart failure Mother    Stroke Mother    Hypertension Mother    Stroke Maternal Grandmother    Hypertension Maternal Grandmother    Colon cancer Neg Hx    Heart attack Neg Hx    Social History   Socioeconomic History   Marital status: Married    Spouse name: Not on file   Number of children: 2   Years of education: Not on file   Highest education level: Not on file  Occupational History   Occupation: retired  Tobacco Use   Smoking status: Former    Current packs/day: 0.00    Types: Cigarettes    Quit date: 01/23/1993    Years since quitting: 31.4   Smokeless tobacco: Never  Vaping Use   Vaping status: Never Used  Substance and Sexual Activity   Alcohol  use: Yes    Alcohol /week: 2.0 standard drinks of alcohol     Types: 2  Glasses of wine per week   Drug use: No   Sexual activity: Not on file  Other Topics Concern   Not on file  Social History Narrative   Patient is married she is retired and has 2 children   About 2 alcoholic beverages a week no drugs,  no current tobacco she is a former smoker      Lives at home with her husband   Right handed   Social Drivers of Health   Financial Resource Strain: Low Risk  (10/03/2020)   Received from [facility]   Overall Financial Resource Strain (CARDIA)    Difficulty of Paying Living Expenses: Not hard at all  Food Insecurity: No Food Insecurity (09/27/2023)   Hunger Vital Sign    Worried About Running Out of Food in the Last Year: Never true    Ran Out of Food in the Last Year: Never true  Transportation Needs: No Transportation Needs (09/27/2023)   PRAPARE - Administrator, Civil Service (Medical): No    Lack of Transportation (Non-Medical): No  Physical Activity: Not on file  Stress: Stress Concern Present (10/03/2020)   Received from [facility] - Occupational Stress Questionnaire    Feeling of Stress : Rather much  Social Connections: Unknown (03/16/2022)   Received from [facility]   Social Network    Social Network: Not on file    Tobacco Counseling Counseling given: Not Answered   Clinical Intake:  Pre-visit preparation completed: No  Pain : No/denies pain Pain Score: 0-No pain     BMI - recorded: 24.37 Nutritional Status: BMI of 19-24  Normal Nutritional Risks: None Diabetes: No  How often do you need to have someone help you when you read instructions, pamphlets, or other written materials from your doctor or pharmacy?: 3 - Sometimes What is the last grade level you completed in school?: college  Interpreter Needed?: No      Activities of Daily Living    11/30/2023    2:36 PM 10/04/2023   11:38 AM  In your present state of health, do you have any difficulty  performing the following activities:  Hearing? 0   Vision? 0   Difficulty concentrating or making decisions? 1   Walking or climbing stairs? 1   Dressing or bathing? 1   Doing errands, shopping? 1 0  Preparing Food and eating ? N   Using the Toilet? N   In the past six months, have you accidently leaked urine? Y   Do you have problems with loss of bowel control? N   Managing your Medications? Y   Managing your Finances? Y   Housekeeping or managing your Housekeeping? Y     Patient Care Team: Charlanne Fredia CROME, MD as PCP - General (Internal Medicine) Pietro Redell RAMAN, MD as PCP - Cardiology (Cardiology) Duke, Jon Garre, PA as Physician Assistant (Cardiology)  Indicate any recent Medical Services you may have received from other than [facility] providers in the past year (date may be approximate).     Assessment:   This is a routine wellness examination for [place].  Hearing/Vision screen No results found.   Goals Addressed   None    Depression Screen    03/05/2024    3:42 PM 11/30/2023    2:38 PM 03/17/2023    3:02 PM  PHQ 2/9 Scores  PHQ - 2 Score 0 0 0    Fall Risk    02/13/2024    1:41 PM 03/17/2023    3:02 PM 04/30/2019   10:20 AM  Fall Risk   Falls in the past year? 1 0 0   Number falls in past yr: 1 0   Injury with Fall? 1 0   Risk for fall due to :  History of fall(s) No Fall Risks   Follow up Falls evaluation completed;Education provided Falls evaluation completed      Data saved with a previous flowsheet row definition    MEDICARE RISK AT HOME:    TIMED UP AND GO:  Was the test performed?  {AMBTIMEDUPGO:[id number]}    Cognitive Function:    12/22/2020    1:33 PM  MMSE - Mini Mental State Exam  Orientation to time   Orientation to Place   Registration   Attention/ Calculation   Recall   Language- name 2 objects   Language- repeat   Language- follow 3 step command   Language- read & follow direction   Write a sentence   Copy design   Total  score      Information is confidential and restricted. Go to Review Flowsheets to unlock data.        06/18/2024   10:10 AM  6CIT Screen  What Year? 0 points  What month? 0 points  What time? 0 points  Count back from 20 0 points  Months in reverse 0 points    Immunizations Immunization History  Administered Date(s) Administered   Fluad Quad(high Dose 65+) 12/07/2023   Influenza, High Dose Seasonal PF 08/04/2017, 07/27/2018, 07/27/2020, 08/06/2021   Influenza,inj,Quad PF,6-35 Mos 12/26/2019   Moderna Covid-19 Fall Seasonal Vaccine 39yrs & older 12/14/2023   PFIZER Comirnaty(Gray Top)Covid-19 Tri-Sucrose Vaccine 04/22/2021   PFIZER(Purple Top)SARS-COV-2 Vaccination 11/17/2019, 12/06/2019   PPD Test 10/13/2020   Pfizer Covid-19 Vaccine Bivalent Booster 65yrs & up 08/27/2021   Tdap 02/15/2022   Zoster Recombinant(Shingrix) 01/18/2018, 03/21/2018, 04/20/2018    {TDAP status:2101805}  {Flu Vaccine status:2101806}  {Pneumococcal vaccine status:2101807}  {Covid-19 vaccine status:2101808}  Qualifies for Shingles Vaccine? {YES/NO:21197}  Zostavax completed {YES/NO:21197}  {Shingrix Completed?:2101804}  Screening Tests Health Maintenance  Topic Date Due   Pneumococcal Vaccine: 50+ Years (1 of 2 - PCV) Never done   DEXA SCAN  Never done   COVID-19 Vaccine (6 - 2024-25 season) 06/12/2024   INFLUENZA VACCINE  06/01/2024   Medicare Annual Wellness (AWV)  11/28/2024   DTaP/Tdap/Td (2 - Td or Tdap) 02/16/2032   Zoster Vaccines- Shingrix  Completed   HPV VACCINES  Aged Out   Meningococcal B Vaccine  Aged Out   Hepatitis C Screening  Discontinued    Health Maintenance  Health Maintenance Due  Topic Date Due   Pneumococcal Vaccine: 50+ Years (1 of 2 - PCV) Never done   DEXA SCAN  Never done   COVID-19 Vaccine (6 - 2024-25 season) 06/12/2024   INFLUENZA VACCINE  06/01/2024    {Colorectal cancer screening:2101809}  {Mammogram status:21018020}  {Bone Density  status:21018021}  Lung Cancer Screening: (Low Dose CT Chest recommended if Age 6-80 years, 20 pack-year currently smoking OR have quit w/in 15years.) {DOES NOT does:27190::does not} qualify.   Lung Cancer Screening Referral: ***  Additional Screening:  Hepatitis C Screening: {DOES NOT does:27190::does not} qualify; Completed ***  Vision Screening: Recommended annual ophthalmology exams for early detection of glaucoma and other disorders of the eye. Is the patient up to date with their annual eye exam?  {YES/NO:21197} Who is the provider or what is the name of the office in which the patient attends annual eye exams? *** If pt is not established with a provider, would they like to be referred to a provider to establish care? {YES/NO:21197}.   Dental Screening: Recommended annual dental exams for proper oral hygiene  Diabetic Foot Exam: {Diabetic Foot Exam:2101802}  Community  Resource Referral / Chronic Care Management: CRR required this visit?  {YES/NO:21197}  CCM required this visit?  {CCM Required choices:[phone]}     Plan:     I have personally reviewed and noted the following in the patient's chart:   Medical and social history Use of alcohol , tobacco or illicit drugs  Current medications and supplements including opioid prescriptions. {Opioid [phone]} Functional ability and status Nutritional status Physical activity Advanced directives List of other physicians Hospitalizations, surgeries, and ER visits in previous 12 months Vitals Screenings to include cognitive, depression, and falls Referrals and appointments  In addition, I have reviewed and discussed with patient certain preventive protocols, quality metrics, and best practice recommendations. A written personalized care plan for preventive services as well as general preventive health recommendations were provided to patient.     Greig FORBES Cluster, NP   06/18/2024   After Visit Summary:  {CHL AMB AWV After Visit Summary:[phone]}  Nurse Notes: ***

## 2024-06-18 NOTE — Progress Notes (Signed)
 Erroneous encounter

## 2024-06-19 DIAGNOSIS — L720 Epidermal cyst: Secondary | ICD-10-CM | POA: Diagnosis not present

## 2024-06-19 DIAGNOSIS — R2681 Unsteadiness on feet: Secondary | ICD-10-CM | POA: Diagnosis not present

## 2024-06-19 DIAGNOSIS — M25551 Pain in right hip: Secondary | ICD-10-CM | POA: Diagnosis not present

## 2024-06-19 DIAGNOSIS — M6281 Muscle weakness (generalized): Secondary | ICD-10-CM | POA: Diagnosis not present

## 2024-06-19 DIAGNOSIS — L2081 Atopic neurodermatitis: Secondary | ICD-10-CM | POA: Diagnosis not present

## 2024-06-19 DIAGNOSIS — S72001D Fracture of unspecified part of neck of right femur, subsequent encounter for closed fracture with routine healing: Secondary | ICD-10-CM | POA: Diagnosis not present

## 2024-06-20 ENCOUNTER — Telehealth: Payer: Self-pay

## 2024-06-20 DIAGNOSIS — F331 Major depressive disorder, recurrent, moderate: Secondary | ICD-10-CM | POA: Diagnosis not present

## 2024-06-20 DIAGNOSIS — M25551 Pain in right hip: Secondary | ICD-10-CM | POA: Diagnosis not present

## 2024-06-20 DIAGNOSIS — S72001D Fracture of unspecified part of neck of right femur, subsequent encounter for closed fracture with routine healing: Secondary | ICD-10-CM | POA: Diagnosis not present

## 2024-06-20 DIAGNOSIS — M6281 Muscle weakness (generalized): Secondary | ICD-10-CM | POA: Diagnosis not present

## 2024-06-20 NOTE — Telephone Encounter (Signed)
 Assisted living resident at [facility], call returned to [place] and tried to redirect her call to the facility (all calls for skilled and assisted living residents should be instructed to call the facility) and she insisted that we route message to Dr.Gupta

## 2024-06-20 NOTE — Telephone Encounter (Signed)
 Copied from CRM #[id number]. Topic: Clinical - Medical Advice >> Jun 20, 2024 11:02 AM Merlynn LABOR wrote: Reason for CRM: Donny Brand called in to speak with provider regarding patients care/regarding psych care consult. Please contact her at [phone].

## 2024-06-21 NOTE — Telephone Encounter (Signed)
Called Debbie Wells

## 2024-06-22 DIAGNOSIS — M6281 Muscle weakness (generalized): Secondary | ICD-10-CM | POA: Diagnosis not present

## 2024-06-22 DIAGNOSIS — S72001D Fracture of unspecified part of neck of right femur, subsequent encounter for closed fracture with routine healing: Secondary | ICD-10-CM | POA: Diagnosis not present

## 2024-06-22 DIAGNOSIS — M25551 Pain in right hip: Secondary | ICD-10-CM | POA: Diagnosis not present

## 2024-06-22 DIAGNOSIS — R2681 Unsteadiness on feet: Secondary | ICD-10-CM | POA: Diagnosis not present

## 2024-06-26 ENCOUNTER — Other Ambulatory Visit: Payer: Self-pay | Admitting: Orthopedic Surgery

## 2024-06-26 DIAGNOSIS — R2681 Unsteadiness on feet: Secondary | ICD-10-CM | POA: Diagnosis not present

## 2024-06-26 DIAGNOSIS — S72001D Fracture of unspecified part of neck of right femur, subsequent encounter for closed fracture with routine healing: Secondary | ICD-10-CM | POA: Diagnosis not present

## 2024-06-26 DIAGNOSIS — M6281 Muscle weakness (generalized): Secondary | ICD-10-CM | POA: Diagnosis not present

## 2024-06-26 DIAGNOSIS — F419 Anxiety disorder, unspecified: Secondary | ICD-10-CM

## 2024-06-26 DIAGNOSIS — M25551 Pain in right hip: Secondary | ICD-10-CM | POA: Diagnosis not present

## 2024-06-26 MED ORDER — ALPRAZOLAM 0.5 MG PO TABS: 0.5000 mg | ORAL_TABLET | Freq: Two times a day (BID) | ORAL | 0 refills | Status: DC | PRN

## 2024-06-27 DIAGNOSIS — M6281 Muscle weakness (generalized): Secondary | ICD-10-CM | POA: Diagnosis not present

## 2024-06-27 DIAGNOSIS — M25551 Pain in right hip: Secondary | ICD-10-CM | POA: Diagnosis not present

## 2024-06-27 DIAGNOSIS — F331 Major depressive disorder, recurrent, moderate: Secondary | ICD-10-CM | POA: Diagnosis not present

## 2024-06-27 DIAGNOSIS — S72001D Fracture of unspecified part of neck of right femur, subsequent encounter for closed fracture with routine healing: Secondary | ICD-10-CM | POA: Diagnosis not present

## 2024-06-29 ENCOUNTER — Encounter: Payer: Self-pay | Admitting: Orthopedic Surgery

## 2024-06-29 ENCOUNTER — Non-Acute Institutional Stay: Payer: Self-pay | Admitting: Orthopedic Surgery

## 2024-06-29 DIAGNOSIS — S72001D Fracture of unspecified part of neck of right femur, subsequent encounter for closed fracture with routine healing: Secondary | ICD-10-CM | POA: Diagnosis not present

## 2024-06-29 DIAGNOSIS — M6281 Muscle weakness (generalized): Secondary | ICD-10-CM | POA: Diagnosis not present

## 2024-06-29 DIAGNOSIS — M25551 Pain in right hip: Secondary | ICD-10-CM | POA: Diagnosis not present

## 2024-06-29 DIAGNOSIS — R2681 Unsteadiness on feet: Secondary | ICD-10-CM | POA: Diagnosis not present

## 2024-06-29 DIAGNOSIS — H00012 Hordeolum externum right lower eyelid: Secondary | ICD-10-CM | POA: Diagnosis not present

## 2024-06-29 MED ORDER — ERYTHROMYCIN 5 MG/GM OP OINT: 1.0000 | TOPICAL_OINTMENT | Freq: Two times a day (BID) | OPHTHALMIC | Status: AC

## 2024-06-29 NOTE — Progress Notes (Signed)
 Location:  [facility] Room Number: 14A Place of Service:  ALF [phone]) Provider:  Gil No NP  Charlanne Fredia CROME, MD  Patient Care Team: Charlanne Fredia CROME, MD as PCP - General (Internal Medicine) Pietro Redell RAMAN, MD as PCP - Cardiology (Cardiology) Duke, Jon Garre, PA as Physician Assistant (Cardiology)  Extended Emergency Contact Information Primary Emergency Contact: [facility] Phone: [phone] Relation: Son  Code Status:   Goals of care: Advanced Directive information    06/29/2024   11:23 AM  Advanced Directives  Does Patient Have a Medical Advance Directive? No  Would patient like information on creating a medical advance directive? No - Patient declined     Chief Complaint  Patient presents with   Acute Visit    Stye on right eye     HPI:  Pt is a 83 y.o. female seen today for acute visit due to stye of right eye.   She currently resides in [place] at [facility] 09/12/2023. PMH: HTN, HLD, migraines, IBS, thyroid  disease, neuropathy, CKD, recurrent UTI, hyponatremia, s/p right hip fracture 09/26/2023 s/p right hemiarthroplasty 09/27/2023, major depression, anxiety and insomnia.   This morning she woke up with increased pain to right lower lid. She has a small nodule. Does no wear eye makeup. She tried warm compresses without relief. No recent eye injury. Afebrile. Vitals stable.   Past Medical History:  Diagnosis Date   Adenomatous colon polyp    Adenomatous polyp of colon 12/03/2004   6mm   Anxiety    Chronic headaches    Chronic insomnia    Depression    Diverticulosis    External hemorrhoids    Hyperlipidemia    Hypertension    Hyperthyroidism    Hypothyroidism    Umbilical hernia    Past Surgical History:  Procedure Laterality Date   ABDOMINAL HYSTERECTOMY  1975   APPENDECTOMY  1975   COLONOSCOPY  03/03/2010,12/03/2004   ESOPHAGOGASTRODUODENOSCOPY  12/16/11   Dr. Lupita Commander   HIP ARTHROPLASTY Right 09/27/2023    Procedure: ARTHROPLASTY BIPOLAR HIP (HEMIARTHROPLASTY);  Surgeon: Vernetta Lonni GRADE, MD;  Location: [facility] OR;  Service: Orthopedics;  Laterality: Right;   OOPHORECTOMY     spinal lipoma removed  2006   TOTAL ABDOMINAL HYSTERECTOMY      Allergies  Allergen Reactions   Hydrocodone -Acetaminophen  Rash    Vicodin=REACTION: rash  Not listed on the MAR   Iodinated Contrast Media Hives and Rash    Per patient she has developed hives and itching in upper trunk of body after last 2 CT scans once she got homes that she has just treated at home with benadryl . She has not told anyone until now. States she was able to bear it  Other reaction(s): Rash Per patient she has developed hives and itching in upper trunk of body after last 2 CT scans once she got homes that she has just treated at home with benadryl . She has not told anyone until now. States she was able to bear it   Not listed on the [facility]    Sulfa Antibiotics Rash    REACTION: rash Other reaction(s): Unknown   Codeine     Unknown reaction  Not listed on the MAR    Sulfonamide Derivatives     REACTION: rash   Terbinafine     Unknown reaction  Not listed on the [facility]    Benazepril  Cough    Not listed on the [facility]     Allergies as  of 06/29/2024       Reactions   Hydrocodone -acetaminophen  Rash   Vicodin=REACTION: rash Not listed on the MAR   Iodinated Contrast Media Hives, Rash   Per patient she has developed hives and itching in upper trunk of body after last 2 CT scans once she got homes that she has just treated at home with benadryl . She has not told anyone until now. States she was able to bear it  Other reaction(s): Rash Per patient she has developed hives and itching in upper trunk of body after last 2 CT scans once she got homes that she has just treated at home with benadryl . She has not told anyone until now. States she was able to bear it  Not listed on the [facility]   Sulfa Antibiotics Rash   REACTION: rash Other  reaction(s): Unknown   Codeine    Unknown reaction  Not listed on the [facility]   Sulfonamide Derivatives    REACTION: rash   Terbinafine    Unknown reaction Not listed on the [facility]   Benazepril  Cough   Not listed on the [facility]        Medication List        Accurate as of June 29, 2024 11:31 AM. If you have any questions, ask your nurse or doctor.          acetaminophen  500 MG tablet Commonly known as: TYLENOL  Take 500 mg by mouth. Give 1000mg  by mouth as needed for pain TID PRN   ALPRAZolam  0.5 MG tablet Commonly known as: XANAX  Take 1 tablet (0.5 mg total) by mouth 2 (two) times daily as needed for anxiety.   amLODipine  2.5 MG tablet Commonly known as: NORVASC  Take 2.5 mg by mouth daily.   aspirin  EC 81 MG tablet Take 81 mg by mouth daily. Swallow whole.   buPROPion  100 MG tablet Commonly known as: WELLBUTRIN  Take 100 mg by mouth daily.   busPIRone  HCl 10 MG Caps Take 15 mg by mouth in the morning and at bedtime.   Docusate Sodium  100 MG capsule Take 100 mg by mouth as needed for constipation.   DULoxetine  30 MG capsule Commonly known as: CYMBALTA  Take 90 mg by mouth daily. 3 capsules once a day   gabapentin  100 MG capsule Commonly known as: NEURONTIN  Take 200 mg by mouth 2 (two) times daily.   levothyroxine  100 MCG tablet Commonly known as: SYNTHROID  Take 100 mcg by mouth daily.   loperamide 2 MG tablet Commonly known as: IMODIUM A-D Take 2 mg by mouth every 8 (eight) hours as needed for diarrhea or loose stools.   losartan  50 MG tablet Commonly known as: COZAAR  Take 50 mg by mouth 2 (two) times daily.   magnesium hydroxide 400 MG/5ML suspension Commonly known as: MILK OF MAGNESIA Take 30 mLs by mouth daily as needed for mild constipation.   methenamine  1 g tablet Commonly known as: HIPREX  Take 1 g by mouth 2 (two) times daily.   metoprolol  succinate 25 MG 24 hr tablet Commonly known as: TOPROL -XL Take 25 mg by mouth 2 (two) times daily.    mirtazapine  30 MG tablet Commonly known as: REMERON  Take 30 mg by mouth at bedtime.   OLANZapine  2.5 MG tablet Commonly known as: ZYPREXA  Take 2.5 mg by mouth at bedtime.   ondansetron  4 MG tablet Commonly known as: ZOFRAN  Take 4 mg by mouth daily as needed for nausea or vomiting. Give 2 tablet by mouth every 24 hours as needed for nausea?vomit  polyethylene glycol 17 g packet Commonly known as: MiraLax  Take 17 g by mouth daily.   REFRESH TEARS OP Place 1 drop into both eyes as needed.   saccharomyces boulardii 250 MG capsule Commonly known as: FLORASTOR Take 250 mg by mouth 2 (two) times daily.   senna 8.6 MG Tabs tablet Commonly known as: SENOKOT Take 1 tablet by mouth daily. May also take 2 tablets by mouth as needed for constipation   senna 8.6 MG Tabs tablet Commonly known as: SENOKOT Take 2 tablets by mouth daily as needed for mild constipation.   spironolactone  12.5 mg Tabs tablet Commonly known as: ALDACTONE  Take 12.5 mg by mouth daily.   triamcinolone cream 0.1 % Commonly known as: KENALOG Apply 1 Application topically 2 (two) times daily.        Review of Systems  Constitutional: Negative.   HENT: Negative.    Eyes:  Positive for pain.  Respiratory: Negative.    Cardiovascular: Negative.   Psychiatric/Behavioral:  Negative for dysphoric mood. The patient is not nervous/anxious.     Immunization History  Administered Date(s) Administered   Fluad Quad(high Dose 65+) 12/07/2023   INFLUENZA, HIGH DOSE SEASONAL PF 08/04/2017, 07/27/2018, 07/27/2020, 08/06/2021   Influenza,inj,Quad PF,6-35 Mos 12/26/2019   Moderna Covid-19 Fall Seasonal Vaccine 12yrs & older 12/14/2023   PFIZER Comirnaty(Gray Top)Covid-19 Tri-Sucrose Vaccine 04/22/2021   PFIZER(Purple Top)SARS-COV-2 Vaccination 11/17/2019, 12/06/2019   PPD Test 10/13/2020   Pfizer Covid-19 Vaccine Bivalent Booster 78yrs & up 08/27/2021   Tdap 02/15/2022   Zoster Recombinant(Shingrix) 01/18/2018,  03/21/2018, 04/20/2018   Pertinent  Health Maintenance Due  Topic Date Due   DEXA SCAN  Never done   INFLUENZA VACCINE  06/01/2024      09/26/2020    3:57 PM 08/07/2021    1:56 PM 02/15/2022    2:38 PM 03/17/2023    3:02 PM 02/13/2024    1:41 PM  Fall Risk  Falls in the past year?    0 1  Was there an injury with Fall?    0 1  Fall Risk Category Calculator    0 3  (RETIRED) Patient Fall Risk Level High fall risk  Low fall risk  Moderate fall risk     Patient at Risk for Falls Due to    No Fall Risks History of fall(s)  Fall risk Follow up    Falls evaluation completed Falls evaluation completed;Education provided     Data saved with a previous flowsheet row definition   Functional Status Survey:    Vitals:   06/29/24 1122  BP: 136/62  Pulse: (!) 56  Resp: 18  Temp: 97.9 F (36.6 C)  SpO2: 98%  Weight: 142 lb (64.4 kg)  Height: 5' 4 (1.626 m)   Body mass index is 24.37 kg/m. Physical Exam Vitals reviewed.  Constitutional:      General: She is not in acute distress. HENT:     Head: Normocephalic.     Nose: Nose normal.     Mouth/Throat:     Mouth: Mucous membranes are moist.  Eyes:     General:        Right eye: No discharge.        Left eye: No discharge.     Comments: Small stye to right lower eyelid  Cardiovascular:     Rate and Rhythm: Normal rate and regular rhythm.     Pulses: Normal pulses.     Heart sounds: Normal heart sounds.  Pulmonary:  Effort: Pulmonary effort is normal.     Breath sounds: Normal breath sounds.  Neurological:     General: No focal deficit present.     Mental Status: She is alert.  Psychiatric:        Mood and Affect: Mood normal.     Labs reviewed: Recent Labs    09/10/23 1823 09/23/23 0000 09/29/23 0524 09/30/23 0614 10/01/23 0340 11/17/23 0000 12/13/23 0000 02/16/24 0000 03/08/24 0000  NA 130*   < > 132* 134* 135   < > 138 130* 130*  K 4.3   < > 3.9 3.6 3.7   < > 4.4 5.0 4.5  CL 96*   < > 104 101 104   <  > 104 98* 97*  CO2 25   < > 25 23 25    < > 29* 25* 25*  GLUCOSE 91   < > 89 89 108*  --   --   --   --   BUN 10   < > 18 16 12    < > 17 18 16   CREATININE 1.02*   < > 1.00 0.79 0.78   < > 0.8 0.9 1.0  CALCIUM  10.3   < > 8.7* 9.0 9.0   < > 9.2 9.6 9.6  MG 1.9  --   --   --   --   --   --   --   --    < > = values in this interval not displayed.   Recent Labs    08/07/23 1245 09/08/23 1532 09/23/23 0000 09/26/23 2043  AST 21 21 19 29   ALT 15 18 14 23   ALKPHOS 60 76 76 82  BILITOT 1.0 0.7  --  0.7  PROT 6.6 7.3  --  6.6  ALBUMIN  4.0 4.3 3.7 3.6   Recent Labs    09/23/23 0000 09/26/23 2043 09/27/23 0607 09/29/23 0524 09/30/23 0614 10/01/23 0340 01/26/24 0000 02/16/24 0000  WBC 5.3 5.2   < > 9.9 8.5 8.4 7.7 7.3  NEUTROABS 3,058.00 3.5  --   --   --   --   --  4,249.00  HGB 11.1* 11.8*   < > 9.3* 10.2* 10.8* 12.1 12.3  HCT 33* 36.3   < > 27.6* 30.0* 32.6* 37 39  MCV  --  96.0   < > 92.3 91.7 90.8  --   --   PLT 223 216   < > 172 192 227 322 276   < > = values in this interval not displayed.   Lab Results  Component Value Date   TSH 0.77 09/23/2023   No results found for: HGBA1C Lab Results  Component Value Date   CHOL 191 09/23/2023   HDL 56 09/23/2023   LDLCALC 112 09/23/2023   TRIG 118 09/23/2023    Significant Diagnostic Results in last 30 days:  No results found.  Assessment/Plan 1. Hordeolum externum of right lower eyelid (Primary) -onset x 1 day - advised to wash face with gentle soap daily - apply warm compress QID - will start ointment due to pain  - erythromycin  ophthalmic ointment; Place 1 Application into the right eye in the morning and at bedtime for 5 days.    Family/ staff Communication: plan discussed with patient and nurse  Labs/tests ordered:   none

## 2024-07-03 DIAGNOSIS — R2681 Unsteadiness on feet: Secondary | ICD-10-CM | POA: Diagnosis not present

## 2024-07-03 DIAGNOSIS — S72001D Fracture of unspecified part of neck of right femur, subsequent encounter for closed fracture with routine healing: Secondary | ICD-10-CM | POA: Diagnosis not present

## 2024-07-03 DIAGNOSIS — M6281 Muscle weakness (generalized): Secondary | ICD-10-CM | POA: Diagnosis not present

## 2024-07-03 DIAGNOSIS — M25551 Pain in right hip: Secondary | ICD-10-CM | POA: Diagnosis not present

## 2024-07-04 DIAGNOSIS — M25551 Pain in right hip: Secondary | ICD-10-CM | POA: Diagnosis not present

## 2024-07-04 DIAGNOSIS — F331 Major depressive disorder, recurrent, moderate: Secondary | ICD-10-CM | POA: Diagnosis not present

## 2024-07-04 DIAGNOSIS — S72001D Fracture of unspecified part of neck of right femur, subsequent encounter for closed fracture with routine healing: Secondary | ICD-10-CM | POA: Diagnosis not present

## 2024-07-04 DIAGNOSIS — M6281 Muscle weakness (generalized): Secondary | ICD-10-CM | POA: Diagnosis not present

## 2024-07-05 DIAGNOSIS — M25551 Pain in right hip: Secondary | ICD-10-CM | POA: Diagnosis not present

## 2024-07-05 DIAGNOSIS — M6281 Muscle weakness (generalized): Secondary | ICD-10-CM | POA: Diagnosis not present

## 2024-07-05 DIAGNOSIS — S72001D Fracture of unspecified part of neck of right femur, subsequent encounter for closed fracture with routine healing: Secondary | ICD-10-CM | POA: Diagnosis not present

## 2024-07-05 DIAGNOSIS — R2681 Unsteadiness on feet: Secondary | ICD-10-CM | POA: Diagnosis not present

## 2024-07-11 ENCOUNTER — Other Ambulatory Visit: Payer: Self-pay | Admitting: Orthopedic Surgery

## 2024-07-11 DIAGNOSIS — M25551 Pain in right hip: Secondary | ICD-10-CM | POA: Diagnosis not present

## 2024-07-11 DIAGNOSIS — F331 Major depressive disorder, recurrent, moderate: Secondary | ICD-10-CM | POA: Diagnosis not present

## 2024-07-11 DIAGNOSIS — F419 Anxiety disorder, unspecified: Secondary | ICD-10-CM

## 2024-07-11 DIAGNOSIS — S72001D Fracture of unspecified part of neck of right femur, subsequent encounter for closed fracture with routine healing: Secondary | ICD-10-CM | POA: Diagnosis not present

## 2024-07-11 DIAGNOSIS — M6281 Muscle weakness (generalized): Secondary | ICD-10-CM | POA: Diagnosis not present

## 2024-07-11 MED ORDER — ALPRAZOLAM 0.5 MG PO TABS: 0.5000 mg | ORAL_TABLET | Freq: Three times a day (TID) | ORAL | 0 refills | Status: DC | PRN

## 2024-07-17 DIAGNOSIS — R2681 Unsteadiness on feet: Secondary | ICD-10-CM | POA: Diagnosis not present

## 2024-07-17 DIAGNOSIS — S72001D Fracture of unspecified part of neck of right femur, subsequent encounter for closed fracture with routine healing: Secondary | ICD-10-CM | POA: Diagnosis not present

## 2024-07-17 DIAGNOSIS — M6281 Muscle weakness (generalized): Secondary | ICD-10-CM | POA: Diagnosis not present

## 2024-07-17 DIAGNOSIS — M25551 Pain in right hip: Secondary | ICD-10-CM | POA: Diagnosis not present

## 2024-07-18 DIAGNOSIS — M6281 Muscle weakness (generalized): Secondary | ICD-10-CM | POA: Diagnosis not present

## 2024-07-18 DIAGNOSIS — S72001D Fracture of unspecified part of neck of right femur, subsequent encounter for closed fracture with routine healing: Secondary | ICD-10-CM | POA: Diagnosis not present

## 2024-07-18 DIAGNOSIS — F331 Major depressive disorder, recurrent, moderate: Secondary | ICD-10-CM | POA: Diagnosis not present

## 2024-07-18 DIAGNOSIS — M25551 Pain in right hip: Secondary | ICD-10-CM | POA: Diagnosis not present

## 2024-07-20 DIAGNOSIS — M25551 Pain in right hip: Secondary | ICD-10-CM | POA: Diagnosis not present

## 2024-07-20 DIAGNOSIS — S72001D Fracture of unspecified part of neck of right femur, subsequent encounter for closed fracture with routine healing: Secondary | ICD-10-CM | POA: Diagnosis not present

## 2024-07-20 DIAGNOSIS — R2681 Unsteadiness on feet: Secondary | ICD-10-CM | POA: Diagnosis not present

## 2024-07-20 DIAGNOSIS — M6281 Muscle weakness (generalized): Secondary | ICD-10-CM | POA: Diagnosis not present

## 2024-07-25 DIAGNOSIS — M25551 Pain in right hip: Secondary | ICD-10-CM | POA: Diagnosis not present

## 2024-07-25 DIAGNOSIS — F331 Major depressive disorder, recurrent, moderate: Secondary | ICD-10-CM | POA: Diagnosis not present

## 2024-07-25 DIAGNOSIS — S72001D Fracture of unspecified part of neck of right femur, subsequent encounter for closed fracture with routine healing: Secondary | ICD-10-CM | POA: Diagnosis not present

## 2024-07-25 DIAGNOSIS — R2681 Unsteadiness on feet: Secondary | ICD-10-CM | POA: Diagnosis not present

## 2024-07-25 DIAGNOSIS — M6281 Muscle weakness (generalized): Secondary | ICD-10-CM | POA: Diagnosis not present

## 2024-07-26 ENCOUNTER — Other Ambulatory Visit: Payer: Self-pay | Admitting: Internal Medicine

## 2024-07-26 DIAGNOSIS — F419 Anxiety disorder, unspecified: Secondary | ICD-10-CM

## 2024-07-26 MED ORDER — ALPRAZOLAM 0.5 MG PO TABS: 0.5000 mg | ORAL_TABLET | Freq: Three times a day (TID) | ORAL | 0 refills | Status: DC | PRN

## 2024-07-27 DIAGNOSIS — M6281 Muscle weakness (generalized): Secondary | ICD-10-CM | POA: Diagnosis not present

## 2024-07-27 DIAGNOSIS — M25551 Pain in right hip: Secondary | ICD-10-CM | POA: Diagnosis not present

## 2024-07-27 DIAGNOSIS — S72001D Fracture of unspecified part of neck of right femur, subsequent encounter for closed fracture with routine healing: Secondary | ICD-10-CM | POA: Diagnosis not present

## 2024-07-27 DIAGNOSIS — R2681 Unsteadiness on feet: Secondary | ICD-10-CM | POA: Diagnosis not present

## 2024-07-30 DIAGNOSIS — F411 Generalized anxiety disorder: Secondary | ICD-10-CM | POA: Diagnosis not present

## 2024-08-01 DIAGNOSIS — S72001D Fracture of unspecified part of neck of right femur, subsequent encounter for closed fracture with routine healing: Secondary | ICD-10-CM | POA: Diagnosis not present

## 2024-08-01 DIAGNOSIS — M25551 Pain in right hip: Secondary | ICD-10-CM | POA: Diagnosis not present

## 2024-08-01 DIAGNOSIS — M6281 Muscle weakness (generalized): Secondary | ICD-10-CM | POA: Diagnosis not present

## 2024-08-01 DIAGNOSIS — F331 Major depressive disorder, recurrent, moderate: Secondary | ICD-10-CM | POA: Diagnosis not present

## 2024-08-03 DIAGNOSIS — S72001D Fracture of unspecified part of neck of right femur, subsequent encounter for closed fracture with routine healing: Secondary | ICD-10-CM | POA: Diagnosis not present

## 2024-08-03 DIAGNOSIS — R2681 Unsteadiness on feet: Secondary | ICD-10-CM | POA: Diagnosis not present

## 2024-08-03 DIAGNOSIS — M6281 Muscle weakness (generalized): Secondary | ICD-10-CM | POA: Diagnosis not present

## 2024-08-03 DIAGNOSIS — M25551 Pain in right hip: Secondary | ICD-10-CM | POA: Diagnosis not present

## 2024-08-08 DIAGNOSIS — M25551 Pain in right hip: Secondary | ICD-10-CM | POA: Diagnosis not present

## 2024-08-08 DIAGNOSIS — F331 Major depressive disorder, recurrent, moderate: Secondary | ICD-10-CM | POA: Diagnosis not present

## 2024-08-08 DIAGNOSIS — S72001D Fracture of unspecified part of neck of right femur, subsequent encounter for closed fracture with routine healing: Secondary | ICD-10-CM | POA: Diagnosis not present

## 2024-08-08 DIAGNOSIS — M6281 Muscle weakness (generalized): Secondary | ICD-10-CM | POA: Diagnosis not present

## 2024-08-08 DIAGNOSIS — R2681 Unsteadiness on feet: Secondary | ICD-10-CM | POA: Diagnosis not present

## 2024-08-10 DIAGNOSIS — M6281 Muscle weakness (generalized): Secondary | ICD-10-CM | POA: Diagnosis not present

## 2024-08-10 DIAGNOSIS — R2681 Unsteadiness on feet: Secondary | ICD-10-CM | POA: Diagnosis not present

## 2024-08-10 DIAGNOSIS — S72001D Fracture of unspecified part of neck of right femur, subsequent encounter for closed fracture with routine healing: Secondary | ICD-10-CM | POA: Diagnosis not present

## 2024-08-10 DIAGNOSIS — M25551 Pain in right hip: Secondary | ICD-10-CM | POA: Diagnosis not present

## 2024-08-14 ENCOUNTER — Other Ambulatory Visit: Payer: Self-pay | Admitting: Adult Health

## 2024-08-14 DIAGNOSIS — R2681 Unsteadiness on feet: Secondary | ICD-10-CM | POA: Diagnosis not present

## 2024-08-14 DIAGNOSIS — M25551 Pain in right hip: Secondary | ICD-10-CM | POA: Diagnosis not present

## 2024-08-14 DIAGNOSIS — F419 Anxiety disorder, unspecified: Secondary | ICD-10-CM

## 2024-08-14 DIAGNOSIS — S72001D Fracture of unspecified part of neck of right femur, subsequent encounter for closed fracture with routine healing: Secondary | ICD-10-CM | POA: Diagnosis not present

## 2024-08-14 DIAGNOSIS — M6281 Muscle weakness (generalized): Secondary | ICD-10-CM | POA: Diagnosis not present

## 2024-08-14 MED ORDER — ALPRAZOLAM 0.5 MG PO TABS: 0.5000 mg | ORAL_TABLET | Freq: Three times a day (TID) | ORAL | 0 refills | Status: DC | PRN

## 2024-08-15 DIAGNOSIS — F331 Major depressive disorder, recurrent, moderate: Secondary | ICD-10-CM | POA: Diagnosis not present

## 2024-08-15 DIAGNOSIS — S72001D Fracture of unspecified part of neck of right femur, subsequent encounter for closed fracture with routine healing: Secondary | ICD-10-CM | POA: Diagnosis not present

## 2024-08-15 DIAGNOSIS — M25551 Pain in right hip: Secondary | ICD-10-CM | POA: Diagnosis not present

## 2024-08-15 DIAGNOSIS — M6281 Muscle weakness (generalized): Secondary | ICD-10-CM | POA: Diagnosis not present

## 2024-08-16 ENCOUNTER — Non-Acute Institutional Stay: Payer: Self-pay | Admitting: Internal Medicine

## 2024-08-16 DIAGNOSIS — F5101 Primary insomnia: Secondary | ICD-10-CM

## 2024-08-16 DIAGNOSIS — E871 Hypo-osmolality and hyponatremia: Secondary | ICD-10-CM

## 2024-08-16 DIAGNOSIS — N390 Urinary tract infection, site not specified: Secondary | ICD-10-CM | POA: Diagnosis not present

## 2024-08-16 DIAGNOSIS — I10 Essential (primary) hypertension: Secondary | ICD-10-CM

## 2024-08-16 DIAGNOSIS — E079 Disorder of thyroid, unspecified: Secondary | ICD-10-CM

## 2024-08-16 DIAGNOSIS — F332 Major depressive disorder, recurrent severe without psychotic features: Secondary | ICD-10-CM

## 2024-08-16 DIAGNOSIS — G63 Polyneuropathy in diseases classified elsewhere: Secondary | ICD-10-CM | POA: Diagnosis not present

## 2024-08-16 NOTE — Progress Notes (Signed)
 [facility] Service:  ALF (13)  Provider:   Code Status: Full COde Goals of Care:     06/29/2024   11:23 AM  Advanced Directives  Does Patient Have a Medical Advance Directive? No  Would patient like information on creating a medical advance directive? No - Patient declined     Chief Complaint  Patient presents with   Care Management    HPI: Patient is a 83 y.o. female seen today for medical management of chronic diseases.    Lives in [place] in [facility]   She also has a history of acute depression with weight loss,  recurrent UTIs, hypertension, hyponatremia, severe anxiety, hypothyroidism     on Remeron  and Zyprexa  Also on Cymbalta  but gets hyponatremia on other SSRI  Depression Doing well Able to deal with her husband's illness.  Does she use Xanax  as needed which helps with her anxiety.   Cognitive impairment Able to manage in AL.  Her slums in the past was 19 out of 30 but anxiety and depression makes it worse.  Patient did not have any acute complaints today.  She is able to walk with her walker does have a mild shuffling gait. No recent falls Wt Readings from Last 3 Encounters:  08/16/24 154 lb (69.9 kg)  06/29/24 142 lb (64.4 kg)  06/15/24 142 lb (64.4 kg)    Past Medical History:  Diagnosis Date   Adenomatous colon polyp    Adenomatous polyp of colon 12/03/2004   6mm   Anxiety    Chronic headaches    Chronic insomnia    Depression    Diverticulosis    External hemorrhoids    Hyperlipidemia    Hypertension    Hyperthyroidism    Hypothyroidism    Umbilical hernia     Past Surgical History:  Procedure Laterality Date   ABDOMINAL HYSTERECTOMY  1975   APPENDECTOMY  1975   COLONOSCOPY  03/03/2010,12/03/2004   ESOPHAGOGASTRODUODENOSCOPY  12/16/11   Dr. Lupita Commander   HIP ARTHROPLASTY Right 09/27/2023   Procedure: ARTHROPLASTY BIPOLAR HIP (HEMIARTHROPLASTY);  Surgeon: Vernetta Lonni GRADE, MD;  Location: [facility] OR;   Service: Orthopedics;  Laterality: Right;   OOPHORECTOMY     spinal lipoma removed  2006   TOTAL ABDOMINAL HYSTERECTOMY      Allergies  Allergen Reactions   Hydrocodone -Acetaminophen  Rash    Vicodin=REACTION: rash  Not listed on the MAR   Iodinated Contrast Media Hives and Rash    Per patient she has developed hives and itching in upper trunk of body after last 2 CT scans once she got homes that she has just treated at home with benadryl . She has not told anyone until now. States she was able to bear it  Other reaction(s): Rash Per patient she has developed hives and itching in upper trunk of body after last 2 CT scans once she got homes that she has just treated at home with benadryl . She has not told anyone until now. States she was able to bear it   Not listed on the [facility]    Sulfa Antibiotics Rash    REACTION: rash Other reaction(s): Unknown   Codeine     Unknown reaction  Not listed on the MAR    Sulfonamide Derivatives     REACTION: rash   Terbinafine     Unknown reaction  Not listed on the [facility]    Benazepril  Cough    Not listed on  the [facility]     Outpatient Encounter Medications as of 08/16/2024  Medication Sig   acetaminophen  (TYLENOL ) 500 MG tablet Take 500 mg by mouth. Give 1000mg  by mouth as needed for pain TID PRN   ALPRAZolam  (XANAX ) 0.5 MG tablet Take 1 tablet (0.5 mg total) by mouth 3 (three) times daily as needed for anxiety.   amLODipine  (NORVASC ) 2.5 MG tablet Take 2.5 mg by mouth daily.   aspirin  EC 81 MG tablet Take 81 mg by mouth daily. Swallow whole.   buPROPion  (WELLBUTRIN ) 100 MG tablet Take 100 mg by mouth daily.   busPIRone  HCl 10 MG CAPS Take 15 mg by mouth in the morning and at bedtime.   Carboxymethylcellulose Sodium (REFRESH TEARS OP) Place 1 drop into both eyes as needed.   Docusate Sodium  100 MG capsule Take 100 mg by mouth as needed for constipation.   DULoxetine  (CYMBALTA ) 30 MG capsule Take 90 mg by mouth daily. 3 capsules once a day    gabapentin  (NEURONTIN ) 100 MG capsule Take 200 mg by mouth 2 (two) times daily.   levothyroxine  (SYNTHROID ) 100 MCG tablet Take 100 mcg by mouth daily.   loperamide (IMODIUM A-D) 2 MG tablet Take 2 mg by mouth every 8 (eight) hours as needed for diarrhea or loose stools.   losartan  (COZAAR ) 50 MG tablet Take 50 mg by mouth 2 (two) times daily.   magnesium hydroxide (MILK OF MAGNESIA) 400 MG/5ML suspension Take 30 mLs by mouth daily as needed for mild constipation.   methenamine  (HIPREX ) 1 g tablet Take 1 g by mouth 2 (two) times daily.   metoprolol  succinate (TOPROL -XL) 25 MG 24 hr tablet Take 25 mg by mouth 2 (two) times daily.   mirtazapine  (REMERON ) 30 MG tablet Take 30 mg by mouth at bedtime.   OLANZapine  (ZYPREXA ) 2.5 MG tablet Take 2.5 mg by mouth at bedtime.   ondansetron  (ZOFRAN ) 4 MG tablet Take 4 mg by mouth daily as needed for nausea or vomiting. Give 2 tablet by mouth every 24 hours as needed for nausea?vomit   polyethylene glycol (MIRALAX ) 17 g packet Take 17 g by mouth daily.   saccharomyces boulardii (FLORASTOR) 250 MG capsule Take 250 mg by mouth 2 (two) times daily.   senna (SENOKOT) 8.6 MG TABS tablet Take 1 tablet by mouth daily. May also take 2 tablets by mouth as needed for constipation   senna (SENOKOT) 8.6 MG TABS tablet Take 2 tablets by mouth daily as needed for mild constipation.   spironolactone  (ALDACTONE ) 12.5 mg TABS tablet Take 12.5 mg by mouth daily.   No facility-administered encounter medications on file as of 08/16/2024.    Review of Systems:  Review of Systems  Constitutional:  Negative for activity change and appetite change.  HENT: Negative.    Respiratory:  Negative for cough and shortness of breath.   Cardiovascular:  Negative for leg swelling.  Gastrointestinal:  Negative for constipation.  Genitourinary: Negative.   Musculoskeletal:  Positive for gait problem. Negative for arthralgias and myalgias.  Skin: Negative.   Neurological:  Negative for  dizziness and weakness.  Psychiatric/Behavioral:  Positive for dysphoric mood. Negative for confusion and sleep disturbance. The patient is nervous/anxious.     Health Maintenance  Topic Date Due   Pneumococcal Vaccine: 50+ Years (1 of 2 - PCV) Never done   DEXA SCAN  Never done   Influenza Vaccine  06/01/2024   COVID-19 Vaccine (6 - 2025-26 season) 07/02/2024   Medicare Annual Wellness (AWV)  11/28/2024   DTaP/Tdap/Td (2 - Td or Tdap) 02/16/2032   Zoster Vaccines- Shingrix  Completed   Meningococcal B Vaccine  Aged Out   Hepatitis C Screening  Discontinued    Physical Exam: Vitals:   08/16/24 0833  BP: 126/80  Pulse: 60  Resp: 20  Temp: (!) 97.1 F (36.2 C)  Weight: 154 lb (69.9 kg)   Body mass index is 26.43 kg/m. Physical Exam Vitals reviewed.  Constitutional:      Appearance: Normal appearance.  HENT:     Head: Normocephalic.     Nose: Nose normal.     Mouth/Throat:     Mouth: Mucous membranes are moist.     Pharynx: Oropharynx is clear.  Eyes:     Pupils: Pupils are equal, round, and reactive to light.  Cardiovascular:     Rate and Rhythm: Normal rate and regular rhythm.     Pulses: Normal pulses.     Heart sounds: Normal heart sounds. No murmur heard. Pulmonary:     Effort: Pulmonary effort is normal.     Breath sounds: Normal breath sounds.  Abdominal:     General: Abdomen is flat. Bowel sounds are normal.     Palpations: Abdomen is soft.  Musculoskeletal:        General: No swelling.     Cervical back: Neck supple.  Skin:    General: Skin is warm.  Neurological:     General: No focal deficit present.     Mental Status: She is alert and oriented to person, place, and time.  Psychiatric:        Mood and Affect: Mood normal.        Thought Content: Thought content normal.     Labs reviewed: Basic Metabolic Panel: Recent Labs    09/10/23 1823 09/23/23 0000 09/26/23 2043 09/29/23 0524 09/30/23 0614 10/01/23 0340 11/17/23 0000  12/13/23 0000 02/16/24 0000 03/08/24 0000  NA 130* 125*   < > 132* 134* 135   < > 138 130* 130*  K 4.3 4.5   < > 3.9 3.6 3.7   < > 4.4 5.0 4.5  CL 96* 92*   < > 104 101 104   < > 104 98* 97*  CO2 25 27*   < > 25 23 25    < > 29* 25* 25*  GLUCOSE 91  --    < > 89 89 108*  --   --   --   --   BUN 10 15   < > 18 16 12    < > 17 18 16   CREATININE 1.02* 1.0   < > 1.00 0.79 0.78   < > 0.8 0.9 1.0  CALCIUM  10.3 9.1   < > 8.7* 9.0 9.0   < > 9.2 9.6 9.6  MG 1.9  --   --   --   --   --   --   --   --   --   TSH  --  0.77  --   --   --   --   --   --   --   --    < > = values in this interval not displayed.   Liver Function Tests: Recent Labs    09/08/23 1532 09/23/23 0000 09/26/23 2043  AST 21 19 29   ALT 18 14 23   ALKPHOS 76 76 82  BILITOT 0.7  --  0.7  PROT 7.3  --  6.6  ALBUMIN  4.3 3.7  3.6   No results for input(s): LIPASE, AMYLASE in the last 8760 hours. No results for input(s): AMMONIA in the last 8760 hours. CBC: Recent Labs    09/23/23 0000 09/26/23 2043 09/27/23 0607 09/29/23 0524 09/30/23 0614 10/01/23 0340 01/26/24 0000 02/16/24 0000  WBC 5.3 5.2   < > 9.9 8.5 8.4 7.7 7.3  NEUTROABS 3,058.00 3.5  --   --   --   --   --  4,249.00  HGB 11.1* 11.8*   < > 9.3* 10.2* 10.8* 12.1 12.3  HCT 33* 36.3   < > 27.6* 30.0* 32.6* 37 39  MCV  --  96.0   < > 92.3 91.7 90.8  --   --   PLT 223 216   < > 172 192 227 322 276   < > = values in this interval not displayed.   Lipid Panel: Recent Labs    09/23/23 0000  CHOL 191  HDL 56  LDLCALC 112  TRIG 881   No results found for: HGBA1C  Procedures since last visit: No results found.  Assessment/Plan 1. Primary hypertension (Primary) BP controlled on Norvasc  and Cozaar  and Toprol  and Aldactone   2. Major depressive disorder, recurrent severe without psychotic features (HCC) Wellbutrin ,Remeron  and Cymbalta  and Low dose of Zyprexa  Also Does Buspar  Uses Xanax  Prn for anxiety  3. Primary insomnia Remeron  and Xanax     4. Neuropathy  Gabapentin    5. Recurrent urinary tract infection Hiprex   6. Hyponatremia Monitor  7 Hypothyroidism TSH repeat  Labs/tests ordered:  Labs Ordered Next appt:  Visit date not found

## 2024-08-17 ENCOUNTER — Encounter: Payer: Self-pay | Admitting: Internal Medicine

## 2024-08-17 DIAGNOSIS — M6281 Muscle weakness (generalized): Secondary | ICD-10-CM | POA: Diagnosis not present

## 2024-08-17 DIAGNOSIS — R2681 Unsteadiness on feet: Secondary | ICD-10-CM | POA: Diagnosis not present

## 2024-08-17 DIAGNOSIS — S72001D Fracture of unspecified part of neck of right femur, subsequent encounter for closed fracture with routine healing: Secondary | ICD-10-CM | POA: Diagnosis not present

## 2024-08-17 DIAGNOSIS — M25551 Pain in right hip: Secondary | ICD-10-CM | POA: Diagnosis not present

## 2024-08-20 DIAGNOSIS — D649 Anemia, unspecified: Secondary | ICD-10-CM | POA: Diagnosis not present

## 2024-08-20 DIAGNOSIS — E039 Hypothyroidism, unspecified: Secondary | ICD-10-CM | POA: Diagnosis not present

## 2024-08-20 DIAGNOSIS — E559 Vitamin D deficiency, unspecified: Secondary | ICD-10-CM | POA: Diagnosis not present

## 2024-08-20 LAB — CBC: RBC: 3.81 — AB (ref 3.87–5.11)

## 2024-08-20 LAB — CBC AND DIFFERENTIAL
HCT: 34 — AB (ref 36–46)
Hemoglobin: 11.1 — AB (ref 12.0–16.0)
Platelets: 234 10*3/uL (ref 150–400)
WBC: 7.2

## 2024-08-20 LAB — COMPREHENSIVE METABOLIC PANEL WITH GFR
Albumin: 4 (ref 3.5–5.0)
Calcium: 9.2 (ref 8.7–10.7)
Globulin: 2.3
eGFR: 69

## 2024-08-20 LAB — TSH: TSH: 0.08 — AB (ref 0.41–5.90)

## 2024-08-20 LAB — BASIC METABOLIC PANEL WITH GFR
BUN: 21 (ref 4–21)
CO2: 25 — AB (ref 13–22)
Chloride: 103 (ref 99–108)
Creatinine: 0.8 (ref 0.5–1.1)
Glucose: 92
Potassium: 4.4 meq/L (ref 3.5–5.1)
Sodium: 134 — AB (ref 137–147)

## 2024-08-20 LAB — VITAMIN D 25 HYDROXY (VIT D DEFICIENCY, FRACTURES): Vit D, 25-Hydroxy: 35

## 2024-08-20 LAB — HEPATIC FUNCTION PANEL
ALT: 37 U/L — AB (ref 7–35)
AST: 38 — AB (ref 13–35)
Alkaline Phosphatase: 174 — AB (ref 25–125)
Bilirubin, Total: 0.4

## 2024-08-21 DIAGNOSIS — S72001D Fracture of unspecified part of neck of right femur, subsequent encounter for closed fracture with routine healing: Secondary | ICD-10-CM | POA: Diagnosis not present

## 2024-08-21 DIAGNOSIS — F331 Major depressive disorder, recurrent, moderate: Secondary | ICD-10-CM | POA: Diagnosis not present

## 2024-08-21 DIAGNOSIS — M6281 Muscle weakness (generalized): Secondary | ICD-10-CM | POA: Diagnosis not present

## 2024-08-21 DIAGNOSIS — M25551 Pain in right hip: Secondary | ICD-10-CM | POA: Diagnosis not present

## 2024-08-22 DIAGNOSIS — M6281 Muscle weakness (generalized): Secondary | ICD-10-CM | POA: Diagnosis not present

## 2024-08-22 DIAGNOSIS — R2681 Unsteadiness on feet: Secondary | ICD-10-CM | POA: Diagnosis not present

## 2024-08-22 DIAGNOSIS — M25551 Pain in right hip: Secondary | ICD-10-CM | POA: Diagnosis not present

## 2024-08-22 DIAGNOSIS — S72001D Fracture of unspecified part of neck of right femur, subsequent encounter for closed fracture with routine healing: Secondary | ICD-10-CM | POA: Diagnosis not present

## 2024-08-27 DIAGNOSIS — M6281 Muscle weakness (generalized): Secondary | ICD-10-CM | POA: Diagnosis not present

## 2024-08-27 DIAGNOSIS — M25551 Pain in right hip: Secondary | ICD-10-CM | POA: Diagnosis not present

## 2024-08-27 DIAGNOSIS — R2681 Unsteadiness on feet: Secondary | ICD-10-CM | POA: Diagnosis not present

## 2024-08-27 DIAGNOSIS — S72001D Fracture of unspecified part of neck of right femur, subsequent encounter for closed fracture with routine healing: Secondary | ICD-10-CM | POA: Diagnosis not present

## 2024-08-29 DIAGNOSIS — M25551 Pain in right hip: Secondary | ICD-10-CM | POA: Diagnosis not present

## 2024-08-29 DIAGNOSIS — F331 Major depressive disorder, recurrent, moderate: Secondary | ICD-10-CM | POA: Diagnosis not present

## 2024-08-29 DIAGNOSIS — M6281 Muscle weakness (generalized): Secondary | ICD-10-CM | POA: Diagnosis not present

## 2024-08-29 DIAGNOSIS — S72001D Fracture of unspecified part of neck of right femur, subsequent encounter for closed fracture with routine healing: Secondary | ICD-10-CM | POA: Diagnosis not present

## 2024-09-03 ENCOUNTER — Encounter: Payer: Self-pay | Admitting: Radiology

## 2024-09-03 ENCOUNTER — Other Ambulatory Visit: Payer: Self-pay | Admitting: Adult Health

## 2024-09-03 DIAGNOSIS — F419 Anxiety disorder, unspecified: Secondary | ICD-10-CM

## 2024-09-03 DIAGNOSIS — M6281 Muscle weakness (generalized): Secondary | ICD-10-CM | POA: Diagnosis not present

## 2024-09-03 DIAGNOSIS — M25551 Pain in right hip: Secondary | ICD-10-CM | POA: Diagnosis not present

## 2024-09-03 DIAGNOSIS — R2681 Unsteadiness on feet: Secondary | ICD-10-CM | POA: Diagnosis not present

## 2024-09-03 DIAGNOSIS — S72001D Fracture of unspecified part of neck of right femur, subsequent encounter for closed fracture with routine healing: Secondary | ICD-10-CM | POA: Diagnosis not present

## 2024-09-03 MED ORDER — ALPRAZOLAM 0.5 MG PO TABS: 0.5000 mg | ORAL_TABLET | Freq: Three times a day (TID) | ORAL | 0 refills | Status: DC | PRN

## 2024-09-05 DIAGNOSIS — S72001D Fracture of unspecified part of neck of right femur, subsequent encounter for closed fracture with routine healing: Secondary | ICD-10-CM | POA: Diagnosis not present

## 2024-09-05 DIAGNOSIS — M6281 Muscle weakness (generalized): Secondary | ICD-10-CM | POA: Diagnosis not present

## 2024-09-05 DIAGNOSIS — M25551 Pain in right hip: Secondary | ICD-10-CM | POA: Diagnosis not present

## 2024-09-05 DIAGNOSIS — F331 Major depressive disorder, recurrent, moderate: Secondary | ICD-10-CM | POA: Diagnosis not present

## 2024-09-06 DIAGNOSIS — M25551 Pain in right hip: Secondary | ICD-10-CM | POA: Diagnosis not present

## 2024-09-06 DIAGNOSIS — S72001D Fracture of unspecified part of neck of right femur, subsequent encounter for closed fracture with routine healing: Secondary | ICD-10-CM | POA: Diagnosis not present

## 2024-09-06 DIAGNOSIS — M6281 Muscle weakness (generalized): Secondary | ICD-10-CM | POA: Diagnosis not present

## 2024-09-06 DIAGNOSIS — R2681 Unsteadiness on feet: Secondary | ICD-10-CM | POA: Diagnosis not present

## 2024-09-10 DIAGNOSIS — M25551 Pain in right hip: Secondary | ICD-10-CM | POA: Diagnosis not present

## 2024-09-10 DIAGNOSIS — R2681 Unsteadiness on feet: Secondary | ICD-10-CM | POA: Diagnosis not present

## 2024-09-10 DIAGNOSIS — S72001D Fracture of unspecified part of neck of right femur, subsequent encounter for closed fracture with routine healing: Secondary | ICD-10-CM | POA: Diagnosis not present

## 2024-09-10 DIAGNOSIS — M6281 Muscle weakness (generalized): Secondary | ICD-10-CM | POA: Diagnosis not present

## 2024-09-12 DIAGNOSIS — F331 Major depressive disorder, recurrent, moderate: Secondary | ICD-10-CM | POA: Diagnosis not present

## 2024-09-12 DIAGNOSIS — S72001D Fracture of unspecified part of neck of right femur, subsequent encounter for closed fracture with routine healing: Secondary | ICD-10-CM | POA: Diagnosis not present

## 2024-09-12 DIAGNOSIS — M6281 Muscle weakness (generalized): Secondary | ICD-10-CM | POA: Diagnosis not present

## 2024-09-12 DIAGNOSIS — M25551 Pain in right hip: Secondary | ICD-10-CM | POA: Diagnosis not present

## 2024-09-14 DIAGNOSIS — S72001D Fracture of unspecified part of neck of right femur, subsequent encounter for closed fracture with routine healing: Secondary | ICD-10-CM | POA: Diagnosis not present

## 2024-09-14 DIAGNOSIS — R2681 Unsteadiness on feet: Secondary | ICD-10-CM | POA: Diagnosis not present

## 2024-09-14 DIAGNOSIS — M6281 Muscle weakness (generalized): Secondary | ICD-10-CM | POA: Diagnosis not present

## 2024-09-14 DIAGNOSIS — M25551 Pain in right hip: Secondary | ICD-10-CM | POA: Diagnosis not present

## 2024-09-17 DIAGNOSIS — M25551 Pain in right hip: Secondary | ICD-10-CM | POA: Diagnosis not present

## 2024-09-17 DIAGNOSIS — R2681 Unsteadiness on feet: Secondary | ICD-10-CM | POA: Diagnosis not present

## 2024-09-17 DIAGNOSIS — S72001D Fracture of unspecified part of neck of right femur, subsequent encounter for closed fracture with routine healing: Secondary | ICD-10-CM | POA: Diagnosis not present

## 2024-09-17 DIAGNOSIS — M6281 Muscle weakness (generalized): Secondary | ICD-10-CM | POA: Diagnosis not present

## 2024-09-18 DIAGNOSIS — M6281 Muscle weakness (generalized): Secondary | ICD-10-CM | POA: Diagnosis not present

## 2024-09-18 DIAGNOSIS — F331 Major depressive disorder, recurrent, moderate: Secondary | ICD-10-CM | POA: Diagnosis not present

## 2024-09-18 DIAGNOSIS — M25551 Pain in right hip: Secondary | ICD-10-CM | POA: Diagnosis not present

## 2024-09-18 DIAGNOSIS — S72001D Fracture of unspecified part of neck of right femur, subsequent encounter for closed fracture with routine healing: Secondary | ICD-10-CM | POA: Diagnosis not present

## 2024-09-19 DIAGNOSIS — M6281 Muscle weakness (generalized): Secondary | ICD-10-CM | POA: Diagnosis not present

## 2024-09-19 DIAGNOSIS — M25551 Pain in right hip: Secondary | ICD-10-CM | POA: Diagnosis not present

## 2024-09-19 DIAGNOSIS — S72001D Fracture of unspecified part of neck of right femur, subsequent encounter for closed fracture with routine healing: Secondary | ICD-10-CM | POA: Diagnosis not present

## 2024-09-19 DIAGNOSIS — F331 Major depressive disorder, recurrent, moderate: Secondary | ICD-10-CM | POA: Diagnosis not present

## 2024-09-21 DIAGNOSIS — R2681 Unsteadiness on feet: Secondary | ICD-10-CM | POA: Diagnosis not present

## 2024-09-21 DIAGNOSIS — S72001D Fracture of unspecified part of neck of right femur, subsequent encounter for closed fracture with routine healing: Secondary | ICD-10-CM | POA: Diagnosis not present

## 2024-09-21 DIAGNOSIS — M6281 Muscle weakness (generalized): Secondary | ICD-10-CM | POA: Diagnosis not present

## 2024-09-21 DIAGNOSIS — M25551 Pain in right hip: Secondary | ICD-10-CM | POA: Diagnosis not present

## 2024-09-24 ENCOUNTER — Other Ambulatory Visit: Payer: Self-pay | Admitting: Nurse Practitioner

## 2024-09-24 DIAGNOSIS — R2681 Unsteadiness on feet: Secondary | ICD-10-CM | POA: Diagnosis not present

## 2024-09-24 DIAGNOSIS — M25551 Pain in right hip: Secondary | ICD-10-CM | POA: Diagnosis not present

## 2024-09-24 DIAGNOSIS — M6281 Muscle weakness (generalized): Secondary | ICD-10-CM | POA: Diagnosis not present

## 2024-09-24 DIAGNOSIS — F419 Anxiety disorder, unspecified: Secondary | ICD-10-CM

## 2024-09-24 DIAGNOSIS — S72001D Fracture of unspecified part of neck of right femur, subsequent encounter for closed fracture with routine healing: Secondary | ICD-10-CM | POA: Diagnosis not present

## 2024-09-24 MED ORDER — ALPRAZOLAM 0.5 MG PO TABS: 0.5000 mg | ORAL_TABLET | Freq: Three times a day (TID) | ORAL | 0 refills | Status: DC | PRN

## 2024-09-25 DIAGNOSIS — M25551 Pain in right hip: Secondary | ICD-10-CM | POA: Diagnosis not present

## 2024-09-25 DIAGNOSIS — F331 Major depressive disorder, recurrent, moderate: Secondary | ICD-10-CM | POA: Diagnosis not present

## 2024-09-25 DIAGNOSIS — S72001D Fracture of unspecified part of neck of right femur, subsequent encounter for closed fracture with routine healing: Secondary | ICD-10-CM | POA: Diagnosis not present

## 2024-09-25 DIAGNOSIS — M6281 Muscle weakness (generalized): Secondary | ICD-10-CM | POA: Diagnosis not present

## 2024-09-26 DIAGNOSIS — S72001D Fracture of unspecified part of neck of right femur, subsequent encounter for closed fracture with routine healing: Secondary | ICD-10-CM | POA: Diagnosis not present

## 2024-09-26 DIAGNOSIS — F331 Major depressive disorder, recurrent, moderate: Secondary | ICD-10-CM | POA: Diagnosis not present

## 2024-09-26 DIAGNOSIS — M6281 Muscle weakness (generalized): Secondary | ICD-10-CM | POA: Diagnosis not present

## 2024-09-26 DIAGNOSIS — M25551 Pain in right hip: Secondary | ICD-10-CM | POA: Diagnosis not present

## 2024-10-02 DIAGNOSIS — F331 Major depressive disorder, recurrent, moderate: Secondary | ICD-10-CM | POA: Diagnosis not present

## 2024-10-02 DIAGNOSIS — S72001D Fracture of unspecified part of neck of right femur, subsequent encounter for closed fracture with routine healing: Secondary | ICD-10-CM | POA: Diagnosis not present

## 2024-10-02 DIAGNOSIS — M6281 Muscle weakness (generalized): Secondary | ICD-10-CM | POA: Diagnosis not present

## 2024-10-02 DIAGNOSIS — M25551 Pain in right hip: Secondary | ICD-10-CM | POA: Diagnosis not present

## 2024-10-03 DIAGNOSIS — R2681 Unsteadiness on feet: Secondary | ICD-10-CM | POA: Diagnosis not present

## 2024-10-03 DIAGNOSIS — F331 Major depressive disorder, recurrent, moderate: Secondary | ICD-10-CM | POA: Diagnosis not present

## 2024-10-03 DIAGNOSIS — M6281 Muscle weakness (generalized): Secondary | ICD-10-CM | POA: Diagnosis not present

## 2024-10-03 DIAGNOSIS — S72001D Fracture of unspecified part of neck of right femur, subsequent encounter for closed fracture with routine healing: Secondary | ICD-10-CM | POA: Diagnosis not present

## 2024-10-03 DIAGNOSIS — M25551 Pain in right hip: Secondary | ICD-10-CM | POA: Diagnosis not present

## 2024-10-05 DIAGNOSIS — S72001D Fracture of unspecified part of neck of right femur, subsequent encounter for closed fracture with routine healing: Secondary | ICD-10-CM | POA: Diagnosis not present

## 2024-10-05 DIAGNOSIS — R2681 Unsteadiness on feet: Secondary | ICD-10-CM | POA: Diagnosis not present

## 2024-10-05 DIAGNOSIS — M25551 Pain in right hip: Secondary | ICD-10-CM | POA: Diagnosis not present

## 2024-10-05 DIAGNOSIS — M6281 Muscle weakness (generalized): Secondary | ICD-10-CM | POA: Diagnosis not present

## 2024-10-10 ENCOUNTER — Other Ambulatory Visit: Payer: Self-pay | Admitting: Adult Health

## 2024-10-10 DIAGNOSIS — F419 Anxiety disorder, unspecified: Secondary | ICD-10-CM

## 2024-10-10 MED ORDER — ALPRAZOLAM 0.5 MG PO TABS: 0.5000 mg | ORAL_TABLET | Freq: Three times a day (TID) | ORAL | 0 refills | Status: DC | PRN

## 2024-10-24 ENCOUNTER — Other Ambulatory Visit: Payer: Self-pay | Admitting: Internal Medicine

## 2024-10-24 DIAGNOSIS — F419 Anxiety disorder, unspecified: Secondary | ICD-10-CM

## 2024-10-24 MED ORDER — ALPRAZOLAM 0.5 MG PO TABS: 0.5000 mg | ORAL_TABLET | Freq: Three times a day (TID) | ORAL | 0 refills | Status: DC | PRN

## 2024-10-29 LAB — TSH: TSH: 0.18 — AB (ref 0.41–5.90)

## 2024-10-29 LAB — HEMOGLOBIN A1C: Hemoglobin A1C: 5.6

## 2024-11-07 ENCOUNTER — Other Ambulatory Visit: Payer: Self-pay | Admitting: Adult Health

## 2024-11-07 DIAGNOSIS — F419 Anxiety disorder, unspecified: Secondary | ICD-10-CM

## 2024-11-07 MED ORDER — ALPRAZOLAM 0.5 MG PO TABS: 0.5000 mg | ORAL_TABLET | Freq: Three times a day (TID) | ORAL | 0 refills | Status: DC | PRN

## 2024-11-21 ENCOUNTER — Other Ambulatory Visit: Payer: Self-pay | Admitting: Adult Health

## 2024-11-21 DIAGNOSIS — F419 Anxiety disorder, unspecified: Secondary | ICD-10-CM

## 2024-11-21 MED ORDER — ALPRAZOLAM 0.5 MG PO TABS: 0.5000 mg | ORAL_TABLET | Freq: Three times a day (TID) | ORAL | 0 refills | Status: DC | PRN

## 2024-11-29 ENCOUNTER — Non-Acute Institutional Stay: Payer: Self-pay | Admitting: Internal Medicine

## 2024-11-29 ENCOUNTER — Encounter: Payer: Self-pay | Admitting: Internal Medicine

## 2024-11-29 DIAGNOSIS — G63 Polyneuropathy in diseases classified elsewhere: Secondary | ICD-10-CM

## 2024-11-29 DIAGNOSIS — N390 Urinary tract infection, site not specified: Secondary | ICD-10-CM | POA: Diagnosis not present

## 2024-11-29 DIAGNOSIS — E079 Disorder of thyroid, unspecified: Secondary | ICD-10-CM

## 2024-11-29 DIAGNOSIS — R4189 Other symptoms and signs involving cognitive functions and awareness: Secondary | ICD-10-CM | POA: Diagnosis not present

## 2024-11-29 DIAGNOSIS — F5101 Primary insomnia: Secondary | ICD-10-CM | POA: Diagnosis not present

## 2024-11-29 DIAGNOSIS — E871 Hypo-osmolality and hyponatremia: Secondary | ICD-10-CM

## 2024-11-29 DIAGNOSIS — F332 Major depressive disorder, recurrent severe without psychotic features: Secondary | ICD-10-CM | POA: Diagnosis not present

## 2024-11-29 DIAGNOSIS — I10 Essential (primary) hypertension: Secondary | ICD-10-CM

## 2024-11-29 NOTE — Progress Notes (Signed)
 "  Location:  [facility] Room Number: AL14-A Place of Service:  ALF (13)  Provider:   Code Status: Full Code Goals of Care:     06/29/2024   11:23 AM  Advanced Directives  Does Patient Have a Medical Advance Directive? No  Would patient like information on creating a medical advance directive? No - Patient declined     Chief Complaint  Patient presents with   Medical Management of Chronic Issues    Routine visit, medicare annual wellness visit, bone density and pneumonia vaccine    HPI: Patient is a 84 y.o. female seen today for medical management of chronic diseases.    Lives in [place] in [facility]   She also has a history of acute depression with weight loss,  recurrent UTIs, hypertension, hyponatremia, severe anxiety, hypothyroidism     on Remeron  and Zyprexa  Also on Cymbalta  but gets hyponatremia on other SSRI   Depression Doing well Able to deal with her husband's illness.  Continues to need Xanax  3/day  Cognitive impairment Able to manage in [place].  Her slums in the past was 19 out of 30 but anxiety and depression makes it worse.  No Acute issues today Uses her walker no Falls Wt Readings from Last 3 Encounters:  11/29/24 158 lb 9.6 oz (71.9 kg)  08/16/24 154 lb (69.9 kg)  06/29/24 142 lb (64.4 kg)      Past Medical History:  Diagnosis Date   Adenomatous colon polyp    Adenomatous polyp of colon 12/03/2004   6mm   Anxiety    Chronic headaches    Chronic insomnia    Depression    Diverticulosis    External hemorrhoids    Hyperlipidemia    Hypertension    Hyperthyroidism    Hypothyroidism    Umbilical hernia     Past Surgical History:  Procedure Laterality Date   ABDOMINAL HYSTERECTOMY  1975   APPENDECTOMY  1975   COLONOSCOPY  03/03/2010,12/03/2004   ESOPHAGOGASTRODUODENOSCOPY  12/16/11   Dr. Lupita Commander   HIP ARTHROPLASTY Right 09/27/2023   Procedure: ARTHROPLASTY BIPOLAR HIP (HEMIARTHROPLASTY);  Surgeon: Vernetta Lonni GRADE, MD;  Location: [facility] OR;  Service: Orthopedics;  Laterality: Right;   OOPHORECTOMY     spinal lipoma removed  2006   TOTAL ABDOMINAL HYSTERECTOMY      Allergies[1]  Outpatient Encounter Medications as of 11/29/2024  Medication Sig   acetaminophen  (TYLENOL ) 500 MG tablet Take 500 mg by mouth. Give 1000mg  by mouth as needed for pain TID PRN   ALPRAZolam  (XANAX ) 0.5 MG tablet Take 1 tablet (0.5 mg total) by mouth 3 (three) times daily as needed for anxiety.   amLODipine  (NORVASC ) 2.5 MG tablet Take 2.5 mg by mouth daily.   aspirin  EC 81 MG tablet Take 81 mg by mouth daily. Swallow whole.   busPIRone  HCl 10 MG CAPS Take 15 mg by mouth in the morning and at bedtime.   Carboxymethylcellulose Sodium (REFRESH TEARS OP) Place 1 drop into both eyes as needed.   Docusate Sodium  100 MG capsule Take 100 mg by mouth as needed for constipation.   DULoxetine  (CYMBALTA ) 30 MG capsule Take 90 mg by mouth daily. 3 capsules once a day   gabapentin  (NEURONTIN ) 100 MG capsule Take 200 mg by mouth 2 (two) times daily.   levothyroxine  (SYNTHROID ) 100 MCG tablet Take 100 mcg by mouth daily.   loperamide (IMODIUM A-D) 2 MG tablet Take 2 mg by mouth every 8 (  eight) hours as needed for diarrhea or loose stools.   losartan  (COZAAR ) 50 MG tablet Take 50 mg by mouth 2 (two) times daily.   magnesium hydroxide (MILK OF MAGNESIA) 400 MG/5ML suspension Take 30 mLs by mouth daily as needed for mild constipation.   methenamine  (HIPREX ) 1 g tablet Take 1 g by mouth 2 (two) times daily.   metoprolol  succinate (TOPROL -XL) 25 MG 24 hr tablet Take 25 mg by mouth 2 (two) times daily.   mirtazapine  (REMERON ) 30 MG tablet Take 30 mg by mouth at bedtime.   OLANZapine  (ZYPREXA ) 2.5 MG tablet Take 2.5 mg by mouth at bedtime.   ondansetron  (ZOFRAN ) 4 MG tablet Take 4 mg by mouth daily as needed for nausea or vomiting. Give 2 tablet by mouth every 24 hours as needed for nausea?vomit   polyethylene glycol (MIRALAX ) 17 g packet  Take 17 g by mouth daily.   saccharomyces boulardii (FLORASTOR) 250 MG capsule Take 250 mg by mouth 2 (two) times daily.   senna (SENOKOT) 8.6 MG TABS tablet Take 1 tablet by mouth daily. May also take 2 tablets by mouth as needed for constipation   senna (SENOKOT) 8.6 MG TABS tablet Take 2 tablets by mouth daily as needed for mild constipation.   spironolactone  (ALDACTONE ) 12.5 mg TABS tablet Take 12.5 mg by mouth daily.   buPROPion  (WELLBUTRIN ) 100 MG tablet Take 100 mg by mouth daily. (Patient not taking: Reported on 11/29/2024)   No facility-administered encounter medications on file as of 11/29/2024.    Review of Systems:  Review of Systems  Constitutional:  Negative for activity change and appetite change.  HENT: Negative.    Respiratory:  Negative for cough and shortness of breath.   Cardiovascular:  Negative for leg swelling.  Gastrointestinal:  Negative for constipation.  Genitourinary: Negative.   Musculoskeletal:  Positive for gait problem. Negative for arthralgias and myalgias.  Skin: Negative.   Neurological:  Negative for dizziness and weakness.  Psychiatric/Behavioral:  Negative for confusion, dysphoric mood and sleep disturbance. The patient is nervous/anxious.     Health Maintenance  Topic Date Due   Pneumococcal Vaccine: 50+ Years (1 of 2 - PCV) Never done   Bone Density Scan  Never done   Medicare Annual Wellness (AWV)  11/28/2024   COVID-19 Vaccine (7 - 2025-26 season) 02/26/2025   DTaP/Tdap/Td (2 - Td or Tdap) 02/16/2032   Influenza Vaccine  Completed   Zoster Vaccines- Shingrix  Completed   Meningococcal B Vaccine  Aged Out   Hepatitis C Screening  Discontinued    Physical Exam: Vitals:   11/29/24 1243  BP: 128/82  Pulse: (!) 54  Resp: 19  Temp: 98.1 F (36.7 C)  SpO2: 97%  Weight: 158 lb 9.6 oz (71.9 kg)  Height: 5' 4 (1.626 m)   Body mass index is 27.22 kg/m. Physical Exam Vitals reviewed.  Constitutional:      Appearance: Normal  appearance.  HENT:     Head: Normocephalic.     Nose: Nose normal.     Mouth/Throat:     Mouth: Mucous membranes are moist.     Pharynx: Oropharynx is clear.  Eyes:     Pupils: Pupils are equal, round, and reactive to light.  Cardiovascular:     Rate and Rhythm: Normal rate and regular rhythm.     Pulses: Normal pulses.     Heart sounds: Normal heart sounds. No murmur heard. Pulmonary:     Effort: Pulmonary effort is normal.  Breath sounds: Normal breath sounds.  Abdominal:     General: Abdomen is flat. Bowel sounds are normal.     Palpations: Abdomen is soft.  Musculoskeletal:        General: No swelling.     Cervical back: Neck supple.  Skin:    General: Skin is warm.  Neurological:     General: No focal deficit present.     Mental Status: She is alert and oriented to person, place, and time.  Psychiatric:        Mood and Affect: Mood normal.        Thought Content: Thought content normal.     Labs reviewed: Basic Metabolic Panel: Recent Labs    12/13/23 0000 02/16/24 0000 03/08/24 0000  NA 138 130* 130*  K 4.4 5.0 4.5  CL 104 98* 97*  CO2 29* 25* 25*  BUN 17 18 16   CREATININE 0.8 0.9 1.0  CALCIUM  9.2 9.6 9.6   Liver Function Tests: No results for input(s): AST, ALT, ALKPHOS, BILITOT, PROT, ALBUMIN  in the last 8760 hours. No results for input(s): LIPASE, AMYLASE in the last 8760 hours. No results for input(s): AMMONIA in the last 8760 hours. CBC: Recent Labs    01/26/24 0000 02/16/24 0000  WBC 7.7 7.3  NEUTROABS  --  4,249.00  HGB 12.1 12.3  HCT 37 39  PLT 322 276   Lipid Panel: No results for input(s): CHOL, HDL, LDLCALC, TRIG, CHOLHDL, LDLDIRECT in the last 8760 hours. No results found for: HGBA1C  Procedures since last visit: No results found.  Assessment/Plan 1. Primary hypertension (Primary) On Amlodipine , Metoprolol  Losartan   and Aldactone   2. Major depressive disorder, recurrent severe without  psychotic features (HCC) Her Main issue with anxiety Continue Xanax  TID, Remeron  and Cymbalta  Wellbutrin  and Buspar  3. Hypothyroid Check TSH  4. Neuropathy  Gabapentin  5. Recurrent urinary tract infection Hiprex   6. Hyponatremia Sodium stable  7. Cognitive impairment Doing well in [place]    Labs/tests ordered:   Next appt:  Visit date not found        [1]  Allergies Allergen Reactions   Hydrocodone -Acetaminophen  Rash    Vicodin=REACTION: rash  Not listed on the MAR   Iodinated Contrast Media Hives and Rash    Per patient she has developed hives and itching in upper trunk of body after last 2 CT scans once she got homes that she has just treated at home with benadryl . She has not told anyone until now. States she was able to bear it  Other reaction(s): Rash Per patient she has developed hives and itching in upper trunk of body after last 2 CT scans once she got homes that she has just treated at home with benadryl . She has not told anyone until now. States she was able to bear it   Not listed on the [facility]    Sulfa Antibiotics Rash    REACTION: rash Other reaction(s): Unknown   Codeine     Unknown reaction  Not listed on the MAR    Sulfonamide Derivatives     REACTION: rash   Terbinafine     Unknown reaction  Not listed on the [facility]    Benazepril  Cough    Not listed on the [facility]    "

## 2024-12-05 ENCOUNTER — Other Ambulatory Visit: Payer: Self-pay | Admitting: Nurse Practitioner

## 2024-12-05 DIAGNOSIS — F419 Anxiety disorder, unspecified: Secondary | ICD-10-CM

## 2024-12-05 MED ORDER — ALPRAZOLAM 0.5 MG PO TABS: 0.5000 mg | ORAL_TABLET | Freq: Three times a day (TID) | ORAL | 1 refills | Status: AC | PRN
